# Patient Record
Sex: Female | Born: 1935 | Race: White | Hispanic: No | Smoking: Never smoker
Health system: Southern US, Community
[De-identification: ages and names within clinical notes are randomized; demographics above are authoritative.]

## PROBLEM LIST (undated history)

## (undated) DIAGNOSIS — E039 Hypothyroidism, unspecified: Secondary | ICD-10-CM

## (undated) DIAGNOSIS — S72113A Displaced fracture of greater trochanter of unspecified femur, initial encounter for closed fracture: Secondary | ICD-10-CM

## (undated) DIAGNOSIS — Y92009 Unspecified place in unspecified non-institutional (private) residence as the place of occurrence of the external cause: Secondary | ICD-10-CM

## (undated) DIAGNOSIS — F329 Major depressive disorder, single episode, unspecified: Secondary | ICD-10-CM

## (undated) DIAGNOSIS — D509 Iron deficiency anemia, unspecified: Secondary | ICD-10-CM

## (undated) DIAGNOSIS — K31811 Angiodysplasia of stomach and duodenum with bleeding: Secondary | ICD-10-CM

## (undated) DIAGNOSIS — K219 Gastro-esophageal reflux disease without esophagitis: Secondary | ICD-10-CM

## (undated) DIAGNOSIS — G300 Alzheimer's disease with early onset: Secondary | ICD-10-CM

## (undated) DIAGNOSIS — W19XXXA Unspecified fall, initial encounter: Secondary | ICD-10-CM

## (undated) DIAGNOSIS — F32A Depression, unspecified: Secondary | ICD-10-CM

## (undated) DIAGNOSIS — F0280 Dementia in other diseases classified elsewhere without behavioral disturbance: Secondary | ICD-10-CM

## (undated) DIAGNOSIS — D649 Anemia, unspecified: Secondary | ICD-10-CM

## (undated) HISTORY — DX: Alzheimer's disease with early onset: G30.0

## (undated) HISTORY — PX: THYROID SURGERY: SHX805

## (undated) HISTORY — DX: Angiodysplasia of stomach and duodenum with bleeding: K31.811

## (undated) HISTORY — DX: Iron deficiency anemia, unspecified: D50.9

## (undated) HISTORY — PX: CHOLECYSTECTOMY: SHX55

## (undated) HISTORY — DX: Major depressive disorder, single episode, unspecified: F32.9

## (undated) HISTORY — DX: Unspecified place in unspecified non-institutional (private) residence as the place of occurrence of the external cause: Y92.009

## (undated) HISTORY — DX: Dementia in other diseases classified elsewhere without behavioral disturbance: F02.80

## (undated) HISTORY — DX: Anemia, unspecified: D64.9

## (undated) HISTORY — DX: Unspecified fall, initial encounter: W19.XXXA

## (undated) HISTORY — DX: Depression, unspecified: F32.A

## (undated) HISTORY — PX: BREAST REDUCTION SURGERY: SHX8

## (undated) HISTORY — PX: APPENDECTOMY: SHX54

## (undated) HISTORY — DX: Displaced fracture of greater trochanter of unspecified femur, initial encounter for closed fracture: S72.113A

## (undated) HISTORY — PX: OTHER SURGICAL HISTORY: SHX169

---

## 1999-01-02 ENCOUNTER — Other Ambulatory Visit: Admission: RE | Admit: 1999-01-02 | Discharge: 1999-01-02 | Payer: Self-pay | Admitting: Obstetrics and Gynecology

## 2000-12-31 ENCOUNTER — Other Ambulatory Visit: Admission: RE | Admit: 2000-12-31 | Discharge: 2000-12-31 | Payer: Self-pay | Admitting: Obstetrics and Gynecology

## 2001-01-04 ENCOUNTER — Other Ambulatory Visit: Admission: RE | Admit: 2001-01-04 | Discharge: 2001-01-04 | Payer: Self-pay | Admitting: Obstetrics and Gynecology

## 2001-01-04 ENCOUNTER — Encounter (INDEPENDENT_AMBULATORY_CARE_PROVIDER_SITE_OTHER): Payer: Self-pay | Admitting: Specialist

## 2001-12-29 ENCOUNTER — Ambulatory Visit (HOSPITAL_COMMUNITY): Admission: RE | Admit: 2001-12-29 | Discharge: 2001-12-29 | Payer: Self-pay | Admitting: General Surgery

## 2002-02-10 ENCOUNTER — Ambulatory Visit (HOSPITAL_COMMUNITY): Admission: RE | Admit: 2002-02-10 | Discharge: 2002-02-10 | Payer: Self-pay | Admitting: Internal Medicine

## 2002-12-14 ENCOUNTER — Emergency Department (HOSPITAL_COMMUNITY): Admission: EM | Admit: 2002-12-14 | Discharge: 2002-12-14 | Payer: Self-pay | Admitting: Emergency Medicine

## 2002-12-14 ENCOUNTER — Encounter: Payer: Self-pay | Admitting: Emergency Medicine

## 2003-10-10 ENCOUNTER — Ambulatory Visit (HOSPITAL_COMMUNITY): Admission: RE | Admit: 2003-10-10 | Discharge: 2003-10-10 | Payer: Self-pay | Admitting: Pulmonary Disease

## 2003-10-30 ENCOUNTER — Ambulatory Visit (HOSPITAL_COMMUNITY): Admission: RE | Admit: 2003-10-30 | Discharge: 2003-10-30 | Payer: Self-pay | Admitting: Pulmonary Disease

## 2003-12-10 ENCOUNTER — Ambulatory Visit (HOSPITAL_COMMUNITY): Admission: RE | Admit: 2003-12-10 | Discharge: 2003-12-10 | Payer: Self-pay | Admitting: Orthopedic Surgery

## 2004-06-03 ENCOUNTER — Ambulatory Visit (HOSPITAL_COMMUNITY): Admission: RE | Admit: 2004-06-03 | Discharge: 2004-06-03 | Payer: Self-pay | Admitting: Internal Medicine

## 2004-09-24 ENCOUNTER — Ambulatory Visit (HOSPITAL_COMMUNITY): Admission: RE | Admit: 2004-09-24 | Discharge: 2004-09-24 | Payer: Self-pay | Admitting: Internal Medicine

## 2005-03-08 ENCOUNTER — Emergency Department (HOSPITAL_COMMUNITY): Admission: EM | Admit: 2005-03-08 | Discharge: 2005-03-08 | Payer: Self-pay | Admitting: *Deleted

## 2005-03-21 ENCOUNTER — Ambulatory Visit (HOSPITAL_COMMUNITY): Admission: RE | Admit: 2005-03-21 | Discharge: 2005-03-21 | Payer: Self-pay | Admitting: Pulmonary Disease

## 2005-03-28 ENCOUNTER — Ambulatory Visit (HOSPITAL_COMMUNITY): Admission: RE | Admit: 2005-03-28 | Discharge: 2005-03-28 | Payer: Self-pay | Admitting: Pulmonary Disease

## 2005-04-04 ENCOUNTER — Ambulatory Visit (HOSPITAL_COMMUNITY): Admission: RE | Admit: 2005-04-04 | Discharge: 2005-04-04 | Payer: Self-pay | Admitting: Pulmonary Disease

## 2005-06-16 ENCOUNTER — Ambulatory Visit (HOSPITAL_COMMUNITY): Admission: RE | Admit: 2005-06-16 | Discharge: 2005-06-16 | Payer: Self-pay | Admitting: Pulmonary Disease

## 2005-07-23 ENCOUNTER — Encounter: Admission: RE | Admit: 2005-07-23 | Discharge: 2005-07-23 | Payer: Self-pay | Admitting: Orthopedic Surgery

## 2005-07-29 ENCOUNTER — Ambulatory Visit (HOSPITAL_COMMUNITY): Admission: RE | Admit: 2005-07-29 | Discharge: 2005-07-29 | Payer: Self-pay | Admitting: Orthopedic Surgery

## 2005-07-29 ENCOUNTER — Ambulatory Visit (HOSPITAL_BASED_OUTPATIENT_CLINIC_OR_DEPARTMENT_OTHER): Admission: RE | Admit: 2005-07-29 | Discharge: 2005-07-30 | Payer: Self-pay | Admitting: Orthopedic Surgery

## 2005-07-31 ENCOUNTER — Encounter (HOSPITAL_COMMUNITY): Admission: RE | Admit: 2005-07-31 | Discharge: 2005-08-30 | Payer: Self-pay | Admitting: Orthopedic Surgery

## 2005-09-02 ENCOUNTER — Encounter (HOSPITAL_COMMUNITY): Admission: RE | Admit: 2005-09-02 | Discharge: 2005-10-02 | Payer: Self-pay | Admitting: Orthopedic Surgery

## 2006-01-13 ENCOUNTER — Ambulatory Visit (HOSPITAL_COMMUNITY): Admission: RE | Admit: 2006-01-13 | Discharge: 2006-01-13 | Payer: Self-pay | Admitting: Pulmonary Disease

## 2006-05-21 ENCOUNTER — Ambulatory Visit: Payer: Self-pay | Admitting: Otolaryngology

## 2006-05-21 ENCOUNTER — Other Ambulatory Visit: Payer: Self-pay

## 2006-05-27 ENCOUNTER — Inpatient Hospital Stay: Payer: Self-pay | Admitting: Otolaryngology

## 2006-06-23 ENCOUNTER — Inpatient Hospital Stay: Payer: Self-pay | Admitting: Endocrinology

## 2006-08-06 HISTORY — PX: ESOPHAGOGASTRODUODENOSCOPY: SHX1529

## 2006-08-06 HISTORY — PX: COLONOSCOPY: SHX174

## 2006-08-06 HISTORY — PX: OTHER SURGICAL HISTORY: SHX169

## 2006-08-25 ENCOUNTER — Encounter (HOSPITAL_COMMUNITY): Admission: RE | Admit: 2006-08-25 | Discharge: 2006-09-24 | Payer: Self-pay | Admitting: Pulmonary Disease

## 2006-08-25 ENCOUNTER — Ambulatory Visit (HOSPITAL_COMMUNITY): Payer: Self-pay | Admitting: Pulmonary Disease

## 2006-08-31 ENCOUNTER — Ambulatory Visit: Payer: Self-pay | Admitting: Internal Medicine

## 2006-08-31 ENCOUNTER — Ambulatory Visit (HOSPITAL_COMMUNITY): Admission: RE | Admit: 2006-08-31 | Discharge: 2006-08-31 | Payer: Self-pay | Admitting: Internal Medicine

## 2006-09-04 ENCOUNTER — Ambulatory Visit (HOSPITAL_COMMUNITY): Admission: RE | Admit: 2006-09-04 | Discharge: 2006-09-04 | Payer: Self-pay | Admitting: Internal Medicine

## 2006-09-17 ENCOUNTER — Ambulatory Visit: Payer: Self-pay | Admitting: Internal Medicine

## 2006-11-23 ENCOUNTER — Ambulatory Visit: Payer: Self-pay | Admitting: Internal Medicine

## 2006-11-27 ENCOUNTER — Ambulatory Visit: Payer: Self-pay | Admitting: Internal Medicine

## 2007-03-25 ENCOUNTER — Ambulatory Visit (HOSPITAL_COMMUNITY): Admission: RE | Admit: 2007-03-25 | Discharge: 2007-03-25 | Payer: Self-pay | Admitting: Pulmonary Disease

## 2007-06-03 ENCOUNTER — Encounter (HOSPITAL_COMMUNITY): Admission: RE | Admit: 2007-06-03 | Discharge: 2007-07-03 | Payer: Self-pay | Admitting: Endocrinology

## 2007-08-27 ENCOUNTER — Ambulatory Visit (HOSPITAL_COMMUNITY): Admission: RE | Admit: 2007-08-27 | Discharge: 2007-08-27 | Payer: Self-pay | Admitting: Pulmonary Disease

## 2008-04-17 ENCOUNTER — Ambulatory Visit (HOSPITAL_COMMUNITY): Admission: RE | Admit: 2008-04-17 | Discharge: 2008-04-17 | Payer: Self-pay | Admitting: Pulmonary Disease

## 2008-05-09 ENCOUNTER — Ambulatory Visit: Payer: Self-pay | Admitting: Internal Medicine

## 2008-05-11 ENCOUNTER — Encounter (HOSPITAL_COMMUNITY): Admission: RE | Admit: 2008-05-11 | Discharge: 2008-06-10 | Payer: Self-pay | Admitting: Oncology

## 2008-05-11 ENCOUNTER — Ambulatory Visit (HOSPITAL_COMMUNITY): Payer: Self-pay | Admitting: Internal Medicine

## 2008-05-19 ENCOUNTER — Ambulatory Visit: Payer: Self-pay | Admitting: Internal Medicine

## 2008-07-25 ENCOUNTER — Ambulatory Visit: Payer: Self-pay | Admitting: Internal Medicine

## 2009-02-06 ENCOUNTER — Ambulatory Visit (HOSPITAL_COMMUNITY): Payer: Self-pay | Admitting: Pulmonary Disease

## 2009-02-06 ENCOUNTER — Encounter (HOSPITAL_COMMUNITY): Admission: RE | Admit: 2009-02-06 | Discharge: 2009-03-08 | Payer: Self-pay | Admitting: Pulmonary Disease

## 2009-06-04 ENCOUNTER — Encounter (HOSPITAL_COMMUNITY): Admission: RE | Admit: 2009-06-04 | Discharge: 2009-06-05 | Payer: Self-pay | Admitting: Pulmonary Disease

## 2009-06-04 ENCOUNTER — Ambulatory Visit (HOSPITAL_COMMUNITY): Payer: Self-pay | Admitting: Pulmonary Disease

## 2009-08-20 ENCOUNTER — Encounter (HOSPITAL_COMMUNITY): Admission: RE | Admit: 2009-08-20 | Discharge: 2009-09-19 | Payer: Self-pay | Admitting: Oncology

## 2009-08-21 ENCOUNTER — Ambulatory Visit (HOSPITAL_COMMUNITY): Payer: Self-pay | Admitting: Pulmonary Disease

## 2009-11-21 ENCOUNTER — Inpatient Hospital Stay (HOSPITAL_COMMUNITY): Admission: EM | Admit: 2009-11-21 | Discharge: 2009-11-22 | Payer: Self-pay | Admitting: Emergency Medicine

## 2010-01-29 ENCOUNTER — Ambulatory Visit (HOSPITAL_COMMUNITY): Payer: Self-pay | Admitting: Pulmonary Disease

## 2010-01-29 ENCOUNTER — Encounter (HOSPITAL_COMMUNITY): Admission: RE | Admit: 2010-01-29 | Discharge: 2010-02-28 | Payer: Self-pay | Admitting: Pulmonary Disease

## 2010-01-30 ENCOUNTER — Ambulatory Visit (HOSPITAL_COMMUNITY): Payer: Self-pay | Admitting: Pulmonary Disease

## 2010-04-10 ENCOUNTER — Ambulatory Visit (HOSPITAL_COMMUNITY): Payer: Self-pay | Admitting: Pulmonary Disease

## 2010-04-10 ENCOUNTER — Encounter (HOSPITAL_COMMUNITY): Admission: RE | Admit: 2010-04-10 | Discharge: 2010-05-10 | Payer: Self-pay | Admitting: Pulmonary Disease

## 2010-04-11 ENCOUNTER — Ambulatory Visit (HOSPITAL_COMMUNITY): Payer: Self-pay | Admitting: Pulmonary Disease

## 2010-06-06 ENCOUNTER — Ambulatory Visit (HOSPITAL_COMMUNITY): Payer: Self-pay | Admitting: Pulmonary Disease

## 2010-06-06 ENCOUNTER — Encounter (HOSPITAL_COMMUNITY): Admission: RE | Admit: 2010-06-06 | Discharge: 2010-07-05 | Payer: Self-pay | Admitting: Pulmonary Disease

## 2010-07-09 ENCOUNTER — Encounter (HOSPITAL_COMMUNITY): Admission: RE | Admit: 2010-07-09 | Discharge: 2010-08-08 | Payer: Self-pay | Admitting: Pulmonary Disease

## 2010-08-08 ENCOUNTER — Encounter (HOSPITAL_COMMUNITY)
Admission: RE | Admit: 2010-08-08 | Discharge: 2010-09-07 | Payer: Self-pay | Source: Home / Self Care | Admitting: Oncology

## 2010-08-08 ENCOUNTER — Ambulatory Visit (HOSPITAL_COMMUNITY): Payer: Self-pay | Admitting: Pulmonary Disease

## 2010-09-19 ENCOUNTER — Encounter (HOSPITAL_COMMUNITY)
Admission: RE | Admit: 2010-09-19 | Discharge: 2010-10-19 | Payer: Self-pay | Source: Home / Self Care | Attending: Pulmonary Disease | Admitting: Pulmonary Disease

## 2010-11-08 ENCOUNTER — Other Ambulatory Visit: Payer: Self-pay | Admitting: Pulmonary Disease

## 2010-11-08 ENCOUNTER — Encounter (HOSPITAL_COMMUNITY): Payer: Medicare Other

## 2010-11-08 ENCOUNTER — Ambulatory Visit (HOSPITAL_COMMUNITY): Payer: Medicare Other | Attending: Pulmonary Disease

## 2010-11-08 DIAGNOSIS — D649 Anemia, unspecified: Secondary | ICD-10-CM | POA: Insufficient documentation

## 2010-11-08 LAB — HEMOGLOBIN AND HEMATOCRIT, BLOOD
HCT: 21.5 % — ABNORMAL LOW (ref 36.0–46.0)
HCT: 28.4 % — ABNORMAL LOW (ref 36.0–46.0)
Hemoglobin: 6.8 g/dL — CL (ref 12.0–15.0)
Hemoglobin: 9.6 g/dL — ABNORMAL LOW (ref 12.0–15.0)

## 2010-11-09 LAB — CROSSMATCH
ABO/RH(D): B POS
Antibody Screen: NEGATIVE
Unit division: 0

## 2010-12-03 ENCOUNTER — Observation Stay (HOSPITAL_COMMUNITY)
Admission: AD | Admit: 2010-12-03 | Discharge: 2010-12-04 | Disposition: A | Discharge status: Home / Self Care | Payer: Medicare Other | Source: Ambulatory Visit | Attending: Pulmonary Disease | Admitting: Pulmonary Disease

## 2010-12-03 DIAGNOSIS — E119 Type 2 diabetes mellitus without complications: Secondary | ICD-10-CM | POA: Insufficient documentation

## 2010-12-03 DIAGNOSIS — D649 Anemia, unspecified: Principal | ICD-10-CM | POA: Insufficient documentation

## 2010-12-03 DIAGNOSIS — K5521 Angiodysplasia of colon with hemorrhage: Secondary | ICD-10-CM | POA: Insufficient documentation

## 2010-12-03 DIAGNOSIS — E039 Hypothyroidism, unspecified: Secondary | ICD-10-CM | POA: Insufficient documentation

## 2010-12-03 DIAGNOSIS — Z79899 Other long term (current) drug therapy: Secondary | ICD-10-CM | POA: Insufficient documentation

## 2010-12-03 DIAGNOSIS — I10 Essential (primary) hypertension: Secondary | ICD-10-CM | POA: Insufficient documentation

## 2010-12-03 DIAGNOSIS — R5381 Other malaise: Secondary | ICD-10-CM | POA: Insufficient documentation

## 2010-12-03 DIAGNOSIS — K219 Gastro-esophageal reflux disease without esophagitis: Secondary | ICD-10-CM | POA: Insufficient documentation

## 2010-12-03 LAB — DIFFERENTIAL
Eosinophils Relative: 1 % (ref 0–5)
Lymphocytes Relative: 22 % (ref 12–46)
Lymphs Abs: 2 10*3/uL (ref 0.7–4.0)
Monocytes Absolute: 0.6 10*3/uL (ref 0.1–1.0)
Neutro Abs: 6.4 10*3/uL (ref 1.7–7.7)

## 2010-12-03 LAB — CBC
HCT: 24.7 % — ABNORMAL LOW (ref 36.0–46.0)
Hemoglobin: 7.5 g/dL — ABNORMAL LOW (ref 12.0–15.0)
MCHC: 30.4 g/dL (ref 30.0–36.0)
MCV: 73.7 fL — ABNORMAL LOW (ref 78.0–100.0)
RDW: 18.4 % — ABNORMAL HIGH (ref 11.5–15.5)

## 2010-12-03 LAB — COMPREHENSIVE METABOLIC PANEL
ALT: 17 U/L (ref 0–35)
AST: 33 U/L (ref 0–37)
Alkaline Phosphatase: 52 U/L (ref 39–117)
CO2: 19 mEq/L (ref 19–32)
Chloride: 109 mEq/L (ref 96–112)
Creatinine, Ser: 0.76 mg/dL (ref 0.4–1.2)
GFR calc Af Amer: >60 mL/min (ref >60)
GFR calc non Af Amer: >60 mL/min (ref >60)
Potassium: 3.6 mEq/L (ref 3.5–5.1)
Sodium: 141 mEq/L (ref 135–145)
Total Bilirubin: 0.5 mg/dL (ref 0.3–1.2)

## 2010-12-03 LAB — GLUCOSE, CAPILLARY
Glucose-Capillary: 136 mg/dL — ABNORMAL HIGH (ref 70–99)
Glucose-Capillary: 99 mg/dL (ref 70–99)

## 2010-12-04 LAB — CROSSMATCH

## 2010-12-04 LAB — CBC
HCT: 30.3 % — ABNORMAL LOW (ref 36.0–46.0)
HCT: 31.3 % — ABNORMAL LOW (ref 36.0–46.0)
Hemoglobin: 10 g/dL — ABNORMAL LOW (ref 12.0–15.0)
Hemoglobin: 10.5 g/dL — ABNORMAL LOW (ref 12.0–15.0)
MCH: 26 pg (ref 26.0–34.0)
MCV: 77.5 fL — ABNORMAL LOW (ref 78.0–100.0)
MCV: 77.5 fL — ABNORMAL LOW (ref 78.0–100.0)
Platelets: 96 10*3/uL — ABNORMAL LOW (ref 150–400)
RBC: 4.04 MIL/uL (ref 3.87–5.11)
RDW: 17.6 % — ABNORMAL HIGH (ref 11.5–15.5)
WBC: 5.9 10*3/uL (ref 4.0–10.5)
WBC: 5.7 10*3/uL (ref 4.0–10.5)

## 2010-12-04 LAB — DIFFERENTIAL
Basophils Absolute: 0.1 10*3/uL (ref 0.0–0.1)
Eosinophils Absolute: 0.1 10*3/uL (ref 0.0–0.7)
Eosinophils Relative: 3 % (ref 0–5)
Lymphocytes Relative: 29 % (ref 12–46)
Lymphocytes Relative: 21 % (ref 12–46)
Lymphs Abs: 1.7 10*3/uL (ref 0.7–4.0)
Lymphs Abs: 1.2 10*3/uL (ref 0.7–4.0)
Monocytes Absolute: 0.5 10*3/uL (ref 0.1–1.0)
Monocytes Relative: 7 % (ref 3–12)
Neutro Abs: 3.5 10*3/uL (ref 1.7–7.7)
Neutro Abs: 3.9 10*3/uL (ref 1.7–7.7)
Neutrophils Relative %: 69 % (ref 43–77)

## 2010-12-04 LAB — GLUCOSE, CAPILLARY: Glucose-Capillary: 102 mg/dL — ABNORMAL HIGH (ref 70–99)

## 2010-12-16 LAB — CROSSMATCH
ABO/RH(D): B POS
Antibody Screen: NEGATIVE
Unit division: 0
Unit division: 0

## 2010-12-17 LAB — CROSSMATCH
ABO/RH(D): B POS
Antibody Screen: NEGATIVE
Unit division: 0

## 2010-12-17 LAB — HEMOGLOBIN AND HEMATOCRIT, BLOOD
HCT: 24.1 % — ABNORMAL LOW (ref 36.0–46.0)
Hemoglobin: 7.9 g/dL — ABNORMAL LOW (ref 12.0–15.0)

## 2010-12-18 LAB — CROSSMATCH
ABO/RH(D): B POS
Antibody Screen: NEGATIVE

## 2010-12-18 LAB — HEMOGLOBIN AND HEMATOCRIT, BLOOD: HCT: 21.4 % — ABNORMAL LOW (ref 36.0–46.0)

## 2010-12-19 LAB — DIFFERENTIAL
Basophils Absolute: 0.1 10*3/uL (ref 0.0–0.1)
Lymphocytes Relative: 24 % (ref 12–46)
Lymphs Abs: 2.1 10*3/uL (ref 0.7–4.0)
Monocytes Relative: 7 % (ref 3–12)
Neutrophils Relative %: 66 % (ref 43–77)

## 2010-12-19 LAB — CBC
MCV: 77.3 fL — ABNORMAL LOW (ref 78.0–100.0)
Platelets: 123 10*3/uL — ABNORMAL LOW (ref 150–400)
RDW: 22.7 % — ABNORMAL HIGH (ref 11.5–15.5)
WBC: 8.8 10*3/uL (ref 4.0–10.5)

## 2010-12-19 LAB — CROSSMATCH

## 2010-12-19 LAB — SAMPLE TO BLOOD BANK

## 2010-12-21 NOTE — Progress Notes (Signed)
  NAMESUZZANNE, BRUNKHORST                  ACCOUNT NO.:  [id number]  MEDICAL RECORD NO.:  [id number]           PATIENT TYPE:  I  LOCATION:  A303                          FACILITY:  [facility]  PHYSICIAN:  Avantika Shere L. Juanetta Gosling, M.D.DATE OF BIRTH:  1917/08/25  DATE OF PROCEDURE: DATE OF DISCHARGE:                                PROGRESS NOTE   Ms. Jaso is a 77 year old who was admitted with anemia.  She said she had a little more blood in her stool yesterday evening in the hospital, so I want to make sure that her hemoglobin level has stabilized.  She has no other new complaints.  Her exam shows her temperature is 97.7, pulse 78, respirations 20, blood pressure 129/77, O2 sats 97% on room air.  Her chest is clear.  She looks comfortable.  ASSESSMENT:  She is better.  PLAN:  She is going to have another CBC at noon.  If that is okay, I will plan to discharge her home.     Jenevieve Kirschbaum L. Juanetta Gosling, M.D.     ELH/MEDQ  D:  12/04/2010  T:  12/04/2010  Job:  [id number]  Electronically Signed by Kari Baars M.D. on 12/21/2010 10:37:27 AM

## 2010-12-21 NOTE — H&P (Signed)
  Kara Santos, Kara Santos                  ACCOUNT NO.:  [id number]  MEDICAL RECORD NO.:  [id number]           PATIENT TYPE:  I  LOCATION:  A303                          FACILITY:  [facility]  PHYSICIAN:  Sharifah Champine L. Juanetta Gosling, M.D.DATE OF BIRTH:  12/26/1916  DATE OF ADMISSION:  12/03/2010 DATE OF DISCHARGE:  LH                             HISTORY & PHYSICAL   REASON FOR ADMISSION:  Generalized weakness.  HISTORY:  Kara Santos is a 77 year old who has chronic anemia.  She came to my office for her reevaluation but seemed to get weaker while she was here and then I asked her to get a urinalysis.  She went to the restroom provided a urine specimen but had a lot of bright red blood in the toilet.  She was unaware of having a bowel movement, but she certainly had some bright red blood which could have either been from her urine or from her stool.  She has a significant history of AV malformations of the GI tract and chronic anemia requiring chronic blood transfusions. She says she is very weak and just does not feel well, which is fairly sudden in onset.  PAST MEDICAL HISTORY:  Positive for the problems with anemia.  She has diabetes.  She has had a thyroid cancer and has hypothyroidism after treatment for that.  She has hypertension and GERD.  MEDICATION:  List is not provided yet.  SOCIAL HISTORY:  She does not use any alcohol, tobacco, or illicit drugs.  She lives alone.  FAMILY HISTORY:  Noncontributory at her advanced age.  REVIEW OF SYSTEMS:  Except as mentioned, she denies any fever, chills, nausea, vomiting, cough, or congestion.  PHYSICAL EXAMINATION:  GENERAL:  She looks pale but she does not appear to be any more so than when I usually see her. HEENT:  Her pupils are reactive.  Nose and throat are clear.  Mucous membranes are moist. NECK:  Supple without masses.  She does not have any bruits or JVD. CHEST:  Fairly clear. HEART:  Regular without murmur, gallop, or rub. ABDOMEN:  Soft.   Bowel sounds present and active.  She does not have any masses felt. EXTREMITIES:  No edema. CENTRAL NERVOUS SYSTEM:  Grossly intact.  ASSESSMENT:  She has severe weakness.  She has chronic anemia.  She is going to be brought in for observation.  I am going to give her some IV fluids, check her labs, and then depending on the results of that she may need blood transfusion, etc.     Kara Santos L. Juanetta Gosling, M.D.     ELH/MEDQ  D:  12/03/2010  T:  12/03/2010  Job:  [id number]  Electronically Signed by Kari Baars M.D. on 12/21/2010 10:37:04 AM

## 2010-12-21 NOTE — Discharge Summary (Signed)
  NAMEMARITSA, Kara Santos                  ACCOUNT NO.:  [id number]  MEDICAL RECORD NO.:  [id number]           PATIENT TYPE:  I  LOCATION:  A303                          FACILITY:  [facility]  PHYSICIAN:  Britiney Blahnik L. Juanetta Gosling, M.D.DATE OF BIRTH:  05/27/1917  DATE OF ADMISSION:  12/03/2010 DATE OF DISCHARGE:  LH                              DISCHARGE SUMMARY   FINAL DISCHARGE DIAGNOSES: 1. Anemia. 2. 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 3. Diabetes. 4. Hypothyroidism. 5. Hypertension. 6. Gastroesophageal reflux disease.  HISTORY:  Kara Santos is a 77 year old with chronic anemia.  She had been in my office.  On the day of admission, she seemed to get weaker while she was there.  When I asked her to get a urinalysis, she went to the restroom and had a great deal of bright red blood in the toilet.  She has a long known history of AV malformations of the GI tract and has required multiple blood transfusions.  She said she is very weak, which is fairly sudden in onset, although she does have some chronic weakness.  Her exam showed that she was pale.  Pupils reactive.  Nose and throat were clear.  Her abdomen was soft with positive bowel sounds.  Stool was heme-positive, but only mildly.  Her nose and throat were clear.  The rest of her exam essentially unremarkable.  HOSPITAL COURSE:  She was given 2 units of packed red blood cells.  By the time of discharge, her hemoglobin was 10.  On the morning of discharge, she had started with a hemoglobin of 7.5.  She feels better, feels stronger and wants to be discharged and she is going to be discharged home in improved condition to continue with her regular home medications with the addition of iron over-the-counter 325 mg daily.     Drake Wuertz L. Juanetta Gosling, M.D.     ELH/MEDQ  D:  12/04/2010  T:  12/04/2010  Job:  [id number]  Electronically Signed by Kari Baars M.D. on 12/21/2010 10:36:54 AM

## 2010-12-22 LAB — CROSSMATCH

## 2010-12-22 LAB — HEMOGLOBIN AND HEMATOCRIT, BLOOD
HCT: 24.2 % — ABNORMAL LOW (ref 36.0–46.0)
Hemoglobin: 10.9 g/dL — ABNORMAL LOW (ref 12.0–15.0)

## 2010-12-24 LAB — CROSSMATCH: ABO/RH(D): B POS

## 2010-12-24 LAB — HEMOGLOBIN AND HEMATOCRIT, BLOOD
HCT: 19.4 % — ABNORMAL LOW (ref 36.0–46.0)
Hemoglobin: 6.5 g/dL — CL (ref 12.0–15.0)

## 2010-12-25 LAB — TYPE AND SCREEN: Antibody Screen: NEGATIVE

## 2010-12-25 LAB — DIFFERENTIAL
Basophils Absolute: 0 10*3/uL (ref 0.0–0.1)
Basophils Relative: 0 % (ref 0–1)
Basophils Relative: 1 % (ref 0–1)
Eosinophils Absolute: 0.1 10*3/uL (ref 0.0–0.7)
Eosinophils Relative: 2 % (ref 0–5)
Monocytes Absolute: 0.5 10*3/uL (ref 0.1–1.0)
Monocytes Absolute: 0.6 10*3/uL (ref 0.1–1.0)
Monocytes Relative: 5 % (ref 3–12)
Monocytes Relative: 9 % (ref 3–12)
Neutro Abs: 3.6 10*3/uL (ref 1.7–7.7)
Neutro Abs: 6 10*3/uL (ref 1.7–7.7)

## 2010-12-25 LAB — GLUCOSE, CAPILLARY: Glucose-Capillary: 110 mg/dL — ABNORMAL HIGH (ref 70–99)

## 2010-12-25 LAB — IRON AND TIBC
Iron: 10 ug/dL — ABNORMAL LOW (ref 42–135)
UIBC: 374 ug/dL

## 2010-12-25 LAB — BASIC METABOLIC PANEL
BUN: 12 mg/dL (ref 6–23)
CO2: 19 mEq/L (ref 19–32)
Calcium: 7.9 mg/dL — ABNORMAL LOW (ref 8.4–10.5)
Chloride: 109 mEq/L (ref 96–112)
Chloride: 109 mEq/L (ref 96–112)
Creatinine, Ser: 0.68 mg/dL (ref 0.4–1.2)
Creatinine, Ser: 0.86 mg/dL (ref 0.4–1.2)
GFR calc non Af Amer: >60 mL/min (ref >60)
Glucose, Bld: 104 mg/dL — ABNORMAL HIGH (ref 70–99)
Glucose, Bld: 182 mg/dL — ABNORMAL HIGH (ref 70–99)
Potassium: 3.7 mEq/L (ref 3.5–5.1)

## 2010-12-25 LAB — FOLATE: Folate: >24 ng/mL

## 2010-12-25 LAB — RETICULOCYTES
RBC.: 3.96 MIL/uL (ref 3.87–5.11)
Retic Count, Absolute: 95 10*3/uL (ref 19.0–186.0)
Retic Ct Pct: 2.4 % (ref 0.4–3.1)

## 2010-12-25 LAB — FERRITIN: Ferritin: 3 ng/mL — ABNORMAL LOW (ref 10–291)

## 2010-12-25 LAB — CBC
HCT: 32.5 % — ABNORMAL LOW (ref 36.0–46.0)
Hemoglobin: 10.7 g/dL — ABNORMAL LOW (ref 12.0–15.0)
Hemoglobin: 8.3 g/dL — ABNORMAL LOW (ref 12.0–15.0)
MCHC: 32 g/dL (ref 30.0–36.0)
MCHC: 33 g/dL (ref 30.0–36.0)
MCV: 65.6 fL — ABNORMAL LOW (ref 78.0–100.0)
RBC: 4.43 MIL/uL (ref 3.87–5.11)
RDW: 17.2 % — ABNORMAL HIGH (ref 11.5–15.5)
RDW: 25.9 % — ABNORMAL HIGH (ref 11.5–15.5)

## 2010-12-25 LAB — POCT CARDIAC MARKERS: CKMB, poc: <1 ng/mL — ABNORMAL LOW (ref 1.0–8.0)

## 2011-01-08 LAB — CROSSMATCH

## 2011-01-08 LAB — HEMOGLOBIN AND HEMATOCRIT, BLOOD
HCT: 27 % — ABNORMAL LOW (ref 36.0–46.0)
Hemoglobin: 8.9 g/dL — ABNORMAL LOW (ref 12.0–15.0)

## 2011-01-11 LAB — CROSSMATCH
ABO/RH(D): B POS
Antibody Screen: NEGATIVE

## 2011-01-11 LAB — HEMOGLOBIN AND HEMATOCRIT, BLOOD: Hemoglobin: 12 g/dL (ref 12.0–15.0)

## 2011-01-14 LAB — HEMOGLOBIN AND HEMATOCRIT, BLOOD
HCT: 21 % — ABNORMAL LOW (ref 36.0–46.0)
HCT: 28 % — ABNORMAL LOW (ref 36.0–46.0)
Hemoglobin: 9.2 g/dL — ABNORMAL LOW (ref 12.0–15.0)

## 2011-01-14 LAB — CROSSMATCH
ABO/RH(D): B POS
Antibody Screen: NEGATIVE

## 2011-01-29 ENCOUNTER — Encounter (HOSPITAL_COMMUNITY): Payer: Medicare Other | Attending: Pulmonary Disease

## 2011-01-29 ENCOUNTER — Encounter (HOSPITAL_COMMUNITY): Payer: Medicare Other

## 2011-01-29 ENCOUNTER — Other Ambulatory Visit (HOSPITAL_COMMUNITY): Payer: Self-pay | Admitting: Oncology

## 2011-01-29 DIAGNOSIS — D649 Anemia, unspecified: Secondary | ICD-10-CM | POA: Insufficient documentation

## 2011-01-30 ENCOUNTER — Encounter (HOSPITAL_COMMUNITY): Payer: Medicare Other

## 2011-01-31 LAB — CROSSMATCH
ABO/RH(D): B POS
Antibody Screen: NEGATIVE

## 2011-02-18 NOTE — Assessment & Plan Note (Signed)
Kara, Santos                   CHART#:  [id number]   DATE:  05/09/2008                       DOB:  1917-09-04   CHIEF COMPLAINT:  Followup of anemia.   SUBJECTIVE:  [name] is here for followup visit.  She has a history of  iron deficiency anemia with intermittent hematochezia in the past which  we have seen her for multiple occasions.  Please see below for details.  She saw Dr. Juanetta Gosling recently for recheck.  Blood work revealed a  hemoglobin of 8.8, MCV of 68.8, and platelet count 140,000.  It was felt  that she need to have follow up for microcytic anemia.  In addition, she  also had LFTs which were normal and her TSH was 0.012 which was low.  Notably back in January 2009, she had a hemoglobin of 12.8 and MCV of  8.6 at that time.   She states that she was diagnosed with diabetes since we last saw her.  She has been eating very well balanced diet for years.  She does consume  red meat as well as dark green vegetables.  She states she eats well,  although her daughter tells me that sometimes she just nibbles.  She  denies having any fresh blood in her stool.  Sometimes her stools are  dark, but she is on iron supplement for the past 2 months.  She denies  any abdominal pain, nausea, or vomiting, heartburn, dysphagia,  odynophagia, constipation, or diarrhea.  She lost about 7 pounds in the  last year and a half.  She is not sure whether or not this has  stabilized.  The patient states that she returned 3 hemoccults to Dr.  Juanetta Gosling' office in the last couple of weeks, but she is not having the  results.   CURRENT MEDICATIONS:  1. Aciphex 20 mg daily.  2. Synthroid 100 mcg daily.  3. Deplin/L-methylfolate q.a.m p.r.n.  4. Glumetza 500 mg b.i.d.  5. Darvocet p.r.n.  6. Centrum Silver daily.  7. Poly Iron 150 mg daily, nerve pill, p.r.n.   ALLERGIES:  No known drug allergies.   PAST MEDICAL HISTORY:  History of iron deficiency anemia, received 3  units of packed red blood  cells back in late 2007.  She had an EGD and  colonoscopy in November 2007 which revealed a small hiatal hernia and a  large duodenal diverticulum.  She had on colonoscopy normal rectum, left-  sided diverticulum, and normal appearing colonic mucosa.  She  subsequently had a small bowel capsule test which demonstrated numerous  small bowel AVMs and erosions without active bleeding.  Previously she  had been taking NSAIDs and baby aspirin which were stopped.  We  monitored over the course of the year and hemoglobin had been stable in  the 10 range and continued to improve up until January 2009, when her  hemoglobin was actually normal.  She also has osteoarthrosis, prior  appendectomy, cholecystectomy, breast reduction, bilateral cataract  surgery, benign tumor removed from the back on her right scapula,  history of thyroid cancer status post resection, and diabetes mellitus.   PHYSICAL EXAMINATION:  VITAL SIGNS:  Weight 120 pounds down from 127 in  February 2008, temp 98.1, blood pressure 140/70, and pulse 88.  GENERAL:  Pleasant, elderly Caucasian female in no  acute distress.  SKIN:  Warm and dry.  No jaundice.  HEENT:  Sclerae nonicteric.  Oropharyngeal mucosa moist and pink.  CHEST:  Lungs are clear to auscultation.  CARDIAC:  Regular rate and rhythm.  ABDOMEN:  Positive bowel sounds.  Abdomen is flat, soft, nontender, and  nondistended.  No organomegaly or masses.  LOWER EXTREMITIES:  No edema.   IMPRESSION:  [name] is a pleasant 77 year old lady who presents with  profound microcytic anemia.  Hemoccult status is unknown.  We are trying  to retrieve a copy of her hemoccult results which were done recently.  Her hemoglobin January 2009 was normal.  I suspect that she has had a  chronic occult gastrointestinal bleeding.  She is known to have small  bowel arteriovenous malformations.  At this point in time, we need to  recheck her hemoglobin to make sure that she does not need to be  re-  transfused.  If we found out that she is having occult gastrointestinal  bleeding, the only other offer at this point would be to consider  enteroscopy with possible ablation at [facility].   PLAN:  1. Recheck CBC along with iron, TRBC, and ferritin.  2. Retrieve hemoccult results from Dr. Juanetta Gosling' office done about a      week ago.  3. Further recommendations to follow.       Tana Coast, P.A.  Electronically Signed     R. Roetta Sessions, M.D.  Electronically Signed    LL/MEDQ  D:  05/09/2008  T:  05/10/2008  Job:  [id number]   cc:   Ramon Dredge L. Juanetta Gosling, M.D.

## 2011-02-18 NOTE — Assessment & Plan Note (Signed)
Kara Santos, Kara Santos                   CHART#:  [id number]   DATE:  07/25/2008                       DOB:  [birth year]   CHIEF COMPLAINT:  Followup of anemia.   SUBJECTIVE:  The patient is here for followup.  She has a history of  iron deficiency anemia, chronic GI bleeding with numerous small bowel  AVMs and erosions seen at time of small bowel capsule in 2007 while on  NSAIDs and aspirin.  We saw her on follow up several months ago.  She  had persistent chronic occult GI bleeding and worsening anemia.  She  went over to [facility] and  underwent a double-balloon enteroscopy with APC of 12 and tiny vascular  abnormality seen in the jejunum.  This was on June 19, 2008.  It  was estimated about 6 feet of jejunum was inspected.  The scope could  not be advanced any further at that point.   She states that she is feeling well.  She has no overt GI bleeding.  Her  stools are dark on iron.  She is now on Poly-Iron 150 mg b.i.d. and set  up once daily.  She has a lot of non-GI complaints including dental and  ear pain, which she has seen a dentist and Dr. Juanetta Gosling recently.  She  also was seen at the urgent care for this.  She has felt a little weak  when she got up this morning.  Her daughters told me on multiple  occasions before that the patient often does complain of feeling weak on  a regular basis.  She has had about a 7-pound weight loss since we saw  her in August and her daughter contributes this to a change in diet and  she was diagnosed with diabetes a few months ago.  Her weight has  stabilized apparently over the last week or two according to the patient  and the daughter.  They are watching this closely.  She denies any  nausea or vomiting, dysphagia, or odynophagia.  She has chronic  discomfort in the lower abdomen, which she states she has had for years.  No changes with this.  Her bowel movements are regular.   CURRENT MEDICATIONS:   Aciphex 20 mg daily, Synthroid 100 mcg b.i.d.,  Glumetza 500 mg b.i.d., Darvocet p.r.n., Centrum Silver daily, Poly-Iron  150 mg b.i.d., and clorazepate half tablet daily.   ALLERGIES:  No known drug allergies.   PHYSICAL EXAMINATION:  VITAL SIGNS:  Weight 113, down 7 pounds,  temperature 97.4, blood pressure 110/70, and pulse 72.  GENERAL:  Pleasant elderly Caucasian female, in no acute distress.  SKIN:  Warm and dry.  No jaundice.  HEENT:  Sclerae nonicteric.  Oropharyngeal mucosa moist and pink.  CHEST:  Lungs are clear to auscultation.  CARDIAC:  Reveals regular rate and rhythm.  ABDOMEN:  Positive bowel sounds.  Abdomen is soft, flat, nondistended,  and minimal tenderness around her cholecystectomy scar.  No rebound, no  guarding.  No organomegaly or masses.  No abdominal bruits or hernias.  LOWER EXTREMITIES:  No edema.   IMPRESSION:  The patient is a pleasant 77 year old lady with history of  profound microcytic anemia with heme-positive stools due to chronic  occult gastrointestinal bleeding.  She has underwent argon  plasma  coagulation treatment of 12 tiny vascular lesions seen in her jejunum  recently.  The patient states that she has been feeling stronger.  She  states her weight has stabilized.  She had blood work done within the  last 1-2 weeks both at Dr. Gregery Na and Dr. Juanetta Gosling' office, which we  will try to get those results.   PLAN:  Retrieve recent labs.  She should have her CBC periodically  checked given her history of chronic occult GI bleeding.  Further  recommendations to follow.       Tana Coast, P.A.  Electronically Signed     R. Roetta Sessions, M.D.  Electronically Signed    LL/MEDQ  D:  07/25/2008  T:  07/25/2008  Job:  [id number]   cc:   Ramon Dredge L. Juanetta Gosling, M.D.

## 2011-02-21 NOTE — H&P (Signed)
Kara Santos, Kara Santos                  ACCOUNT NO.:  [id number]   MEDICAL RECORD NO.:  [id number]           PATIENT TYPE:   LOCATION:                                FACILITY:  [facility]   PHYSICIAN:  Edward L. Juanetta Gosling, M.D.DATE OF BIRTH:  1917/06/24   DATE OF ADMISSION:  04/09/2005  DATE OF DISCHARGE:  LH                                HISTORY & PHYSICAL   The patient to have a needle aspiration of a thyroid nodule on April 09, 2005.   [name] is an 77 year old who underwent a CT of the chest because of  pulmonary nodules and at that time was found incidentally to have a right  nodule in the thyroid gland which was 1.8 x 2.1 cm. She then had 6an  ultrasound done which showed that there were two solid thyroid nodules, the  largest was in the right at 2.4 cm. It was felt that she should have a  biopsy done. Otherwise, she has been in fairly good health. She has had the  pulmonary nodules which appear to be stable.   She has a history of hypertension but she is on no medications for that.  Gastroesophageal reflux disease for which she takes Aciphex and  osteoarthritis for which she takes over the counter medications.   FAMILY HISTORY:  Her father died his 72's of heart disease, mother in her  20's of cancer. Sh has had a cholecystectomy but no other surgery. She is a  nonsmoker, she does not drink any alcohol.   REVIEW OF SYMPTOMS:  Except as mentioned is negative. She denies any  swallowing problems. No hot flashes, no thyroid symptoms.   PHYSICAL EXAMINATION:  GENERAL:  Well-developed, well-nourished, thin female  who is in no acute distress. I do not feel the thyroid nodule.  VITAL SIGNS:  Blood pressure 110/70, pulse 68, respirations 14.  CHEST:  Clear.  HEART:  Regular.  ABDOMEN:  Soft.  EXTREMITIES:  No edema.  CNS:  Grossly intact.   ASSESSMENT:  She has a thyroid nodule. She is going to undergo a needle  biopsy.       ELH/MEDQ  D:  04/01/2005  T:  04/01/2005  Job:  [id number]

## 2011-02-21 NOTE — Op Note (Signed)
Kara Santos, Kara Santos                  ACCOUNT NO.:  [id number]   MEDICAL RECORD NO.:  [id number]          PATIENT TYPE:  AMB   LOCATION:  DAY                           FACILITY:  [facility]   PHYSICIAN:  R. Roetta Sessions, M.D. DATE OF BIRTH:  08/08/1917   DATE OF PROCEDURE:  08/31/2006  DATE OF DISCHARGE:                                 OPERATIVE REPORT   PROCEDURE:  Colonoscopy, diagnostic, followed by esophagogastroduodenoscopy.   INDICATIONS FOR PROCEDURE:  Patient is an 77 year old lady, whom Dr. Juanetta Gosling  called me about last week.  She is found to be significantly anemic, in  fact, had to have 3 units of packed red blood cells.  Per family report,  iron studies returned, indicating iron deficiency with a total iron-binding  capacity of 412, iron of 18, saturation 4%, ferritin 2.  She has noted some  intermittent hematochezia, but it has been only intermittent in small  volume.  Her hemoglobin and hematocrit today are 11.6 and 35.8, MCV 67.9,  platelet count 133,000.  Colonoscopy is now being done.  If unrevealing, she  will have an EGD.  This approach has been discussed with the patient and the  patient's family.  Risks, benefits and alternatives have been reviewed and  questions answered.  She is agreeable.  Please see documentation on the  medical record.  She is on AcipHex 20 mg orally daily, which has controlled  her reflux symptoms very well.  She does take aspirin 81 mg daily.   PROCEDURE NOTE:  O2 saturation, blood pressure, pulses, and respirations  were monitored throughout the entire procedure.  Conscious sedation for both  procedures, Versed 7 mg IV, Demerol 50 mg IV in divided doses.  Cetacaine  spray for topical oropharyngeal anesthesia.   INSTRUMENT:  Olympus video chip system.   FINDINGS:  Digital rectal exam revealed no abnormalities.   ENDOSCOPIC FINDINGS:  Prep was adequate.   COLON:  The colonic mucosa was surveyed from the rectosigmoid junction to  the left  transverse, right colon, the appendiceal orifice, the ileocecal  valve, and cecum.  These structures were well seen and photographed for the  record.  From this level, the scope was slowly withdrawn and all previously  mentioned mucosal surfaces were again seen.  The patient had left-sided  diverticula; however, the colonic mucosa appeared normal otherwise.  Examination of the rectum, including retroflexed view of the anal verge,  revealed no abnormalities.  The patient tolerated the procedure well and was  then prepared for upper endoscopy.   FINDINGS:  Examination of the tubular esophagus revealed no mucosal  abnormalities.  EG junction was easy to traverse.   STOMACH:  The gastric cavity was empty and insufflated well with air.  Thorough examination of the gastric mucosa included a retroflexed view of  the proximal stomach and the esophagogastric junction demonstrated only a  small hiatal hernia.  The pylorus was patent and easily transversed.  Examination of the bulb and second portion revealed a large D2 diverticulum,  otherwise the mucosa appeared normal.  The patient tolerated both procedures  well and was reactive to endoscopy.   COLONOSCOPY IMPRESSION:  Normal rectum.  Left-sided diverticulum and colonic  mucosa appeared normal.   EGD FINDINGS:  Normal esophagus, small hiatal hernia, otherwise normal  stomach.  Large duodenal, diverticulum, and D2, otherwise normal D1 and D2.  No explanation for patient's iron-deficiency anemia, based on today's  endoscopy.   RECOMMENDATIONS:  1. Proceed with a Given small bowel capsule study in the very near future.      Further recommendations to follow.  2. I have told Ms. Godinho to avoid taking aspirin until further notice.      Jonathon Bellows, M.D.  Electronically Signed     RMR/MEDQ  D:  08/31/2006  T:  08/31/2006  Job:  [id number]   cc:   Ramon Dredge L. Juanetta Gosling, M.D.  Fax: [phone]

## 2011-02-21 NOTE — H&P (Signed)
[facility]  Patient:    Kara Santos, Kara Santos Visit Number: [id number] MRN: [id number]          Service Type: Attending:  Franky Macho, M.D. Dictated by:   Franky Macho, M.D. Adm. Date:  12/29/01   CC:         Damaris Schooner, M.D.   History and Physical  DATE OF BIRTH:  [birth year]  CHIEF COMPLAINT:  Mass, back.  HISTORY OF PRESENT ILLNESS:  The patient is an 77 year old white female, referred for evaluation and treatment of a mass on her back.  It has been present for some time, but recently has increased in size and causing discomfort.  No drainage has been noted.  PAST MEDICAL HISTORY:  Unremarkable.  PAST SURGICAL HISTORY:  1. Breast reduction surgery.  2. Cholecystectomy.  CURRENT MEDICATIONS:  1. Zantac.  2. Baby aspirin.  ALLERGIES:  No known drug allergies.  REVIEW OF SYSTEMS:  Unremarkable.  PHYSICAL EXAMINATION:  GENERAL:  The patient is a well-developed, well-nourished white female in no acute distress.  VITAL SIGNS:  She is afebrile and vital signs are stable.  LUNGS:  Clear to auscultation with equal breath sounds bilaterally.  HEART:  Regular rate and rhythm without S3, S4, or murmurs.  BACK:  Large, greater than 6 cm, subcutaneous oval mass over the right scapular, rubbery and mobile in nature.  No erythema is noted.  IMPRESSION:  Mass, back.  PLAN:  The patient is scheduled for excision of the mass of the back on December 29, 2001.  The risks and benefits of the procedure including bleeding, infection, and the possibility of recurrence were fully explained to the patient, who gave informed consent. Dictated by:   Franky Macho, M.D. Attending:  Franky Macho, M.D. DD:  12/23/01 TD:  12/25/01 Job: [id number]

## 2011-02-21 NOTE — Op Note (Signed)
Kara Santos, Kara Santos                  ACCOUNT NO.:  [id number]   MEDICAL RECORD NO.:  [id number]          PATIENT TYPE:  AMB   LOCATION:  DSC                          FACILITY:  [facility]   PHYSICIAN:  Robert A. Thurston Hole, M.D. DATE OF BIRTH:  Jan 03, 1917   DATE OF PROCEDURE:  07/29/2005  DATE OF DISCHARGE:                                 OPERATIVE REPORT   PREOPERATIVE DIAGNOSIS:  Right shoulder rotator cuff tear with partial  labrum tear.   POSTOPERATIVE DIAGNOSIS:  1.  Right shoulder irreparable rotator cuff tear.  2.  Right shoulder partial labrum tear.   PROCEDURE:  1.  Right shoulder examination under anesthesia followed by arthroscopic      debridement of irreparable rotator cuff tear.  2.  Right shoulder partial labrum tear debridement.   SURGEON:  Elana Alm. Thurston Hole, M.D.   ASSISTANT:  Julien Girt, P.A.-C.   ANESTHESIA:  General.   OPERATIVE TIME:  45 minutes.   COMPLICATIONS:  None.   INDICATIONS FOR PROCEDURE:  Kara Santos is an 77 year old woman who has had  significant shoulder pain for the past two months secondary to a fall.  Exam  and MRI has documented a complete rotator cuff tear with some contraction  and due to her significant severe pain, she is now to undergo arthroscopy  and either debridement versus repair.   DESCRIPTION OF PROCEDURE:  Kara Santos was brought to the operating room on  July 29, 2005, and placed on the operating table in supine position.  She  received Ancef 1 gram IV preoperatively for prophylaxis.  After being placed  under general anesthesia, her right shoulder was examined.  She had full  range of motion and her shoulder was stable to ligamentous exam.  She was  then placed in a beach chair position and her shoulder and arm were prepped  using sterile DuraPrep and draped using sterile technique.  The shoulder was  sterilely injected with 0.25% Marcaine with epinephrine.  Initially, the  arthroscopy was performed through a posterior  arthroscopic portal, the  arthroscope with a pump attached was placed and through an anterior portal,  an arthroscopic probe was placed.  On initial inspection, the articular  cartilage in the glenohumeral joint showed minimal degenerative changes.  She had partial tearing of the anterior, superior, and posterior labrum, 25-  30% which was debrided.  The biceps tendon anchor and biceps tendon was  intact.  The inferior labrum and anterior inferior glenohumeral ligament  complex was intact.  The rotator cuff showed a complete tear with retraction  of the supraspinatus as well as a partial tear with retraction of the  infraspinatus and partial tear with retraction of the subscapularis.  The  torn edges were carefully and thoroughly debrided but attempts to mobilize  these portions of the rotator cuff were unsuccessful, they were  significantly retracted and scarred in, consistent with a chronic type tear.  After debridement was carried out, I elected not to perform a subacromial  decompression because I did not want to destabilize the humeral head.  Approximately 1/2 of the  humeral head was uncovered after this debridement  had been carried out.  The edges were very smooth and rounded.  A subtotal  bursectomy was carried out.  The shoulder could then be brought through a  full range of motion with no impingement.  At this point, it was felt that  all the pathology had been satisfactorily addressed.  The arthroscopic  instruments were removed.  The portals were closed with 3-0 nylon sutures.  Sterile dressings were applied.  She was found to have an epidermolysis type  reaction to possibly the DuraPrep where there was significant scaling and  rash and skin reaction around the upper portion of her arm which was not a  systemic reaction.  We placed very careful bandages without tape on these  areas.  Of note is that she had been given Decadron 10 mg preoperatively for  nausea and vomiting.  At  this point, the patient was awakened and taken to  the recovery room in stable condition.  Needle and sponge counts were  correct x 2 at the end of the case.   FOLLOW UP CARE:  Kara Santos will be followed as a Recovery [facility] patient  overnight for observation.  She will be discharged tomorrow if stable.  She  will be seen back in the office in a week for sutures out and follow up.      Robert A. Thurston Hole, M.D.  Electronically Signed     RAW/MEDQ  D:  07/29/2005  T:  07/29/2005  Job:  [id number]

## 2011-02-21 NOTE — Op Note (Signed)
[facility]  Patient:    Kara Santos, Kara Santos Visit Number: [id number] MRN: [id number]          Service Type: DSU Location: DAY Attending Physician:  Dalia Heading Dictated by:   Franky Macho, M.D. Proc. Date: 12/29/01 Admit Date:  12/29/2001   CC:         Dr. Esmeralda Arthur   Operative Report  AGE:  76 years old.  PREOPERATIVE DIAGNOSIS:  Large mass, back.  POSTOPERATIVE DIAGNOSIS:  Lipoma, 7 cm.  PROCEDURE:  Excision of 7 cm x 9 mass, back.  SURGEON:  Franky Macho, M.D.  ANESTHESIA:  General.  INDICATIONS:  The patient is an 77 year old white female who presents with an enlarging mass over the right scapular region of the back.  The risks and benefits of the procedure including bleeding, infection, and recurrence of the mass were fully explained to the patient and gave an informed consent.  PROCEDURE:  The patient was placed in the left lateral decubitus position after induction of general anesthesia.  The back was prepped and draped using the usual sterile technique with Betadine.  A longitudinal incision was made down to the mass.  The mass was excised without difficulty.  A lipoma was found.  The subcutaneous layer was reapproximated using a 3-0 Vicryl interrupted suture.  The skin was closed using staples.  Betadine ointment and a dry, sterile dressing were applied. 0.25% Marcaine was instilled into the surrounding wound.  All needle counts were correct at the end of the procedure.  The patient was awakened and transferred to PACU in stable condition.  COMPLICATIONS:  None.  SPECIMENS:  Lipoma, back.  BLOOD LOSS: Minimal. Dictated by:   Franky Macho, M.D. Attending Physician:  Dalia Heading DD:  12/29/01 TD:  12/30/01 Job: [id number]

## 2011-02-21 NOTE — Op Note (Signed)
NAMEMODESTINE, SCHERZINGER                  ACCOUNT NO.:  [id number]   MEDICAL RECORD NO.:  [id number]          PATIENT TYPE:  AMB   LOCATION:  DAY                           FACILITY:  [facility]   PHYSICIAN:  R. Roetta Sessions, M.D. DATE OF BIRTH:  28-Sep-1917   DATE OF PROCEDURE:  09/04/2006  DATE OF DISCHARGE:  09/04/2006                               OPERATIVE REPORT   PROCEDURE:  Givens small bowel capsule endoscopy.   PHYSICIAN:  R. Roetta Sessions, M.D.   INDICATIONS FOR PROCEDURE:  The patient is an 77 year old lady who has a  history of significant anemia.  Studies suggested iron deficiency  anemia.  She has had intermittent hematochezia which is small volume.  Recent EGD revealed a small hiatal hernia and a large duodenal  diverticulum.  Colonoscopy recently revealed left sided diverticulum  but, otherwise, normal.  The patient has been on aspirin 81 mg daily.  Aspirin was discontinued, however, at the time of her colonoscopy on  August 31, 2006.   PROCEDURE FINDINGS:  The patient swallowed the capsule without any  difficulty.  The first gastric image was at 45 seconds.  She had what  appeared to be several small gastric erosions.  Gastric passage time was  1 hour.  Small bowel passage time was 1 hour 12 minutes.  Throughout her  entire small bowel, she had numerous small AVMs.  The first ileocecal  image was at 2 hours 12 minutes 39 seconds.  First cecal image 2 hours  13 minutes 24 seconds.   SUMMARY AND RECOMMENDATIONS:  A few small gastric erosions.  Numerous  small bowel AVMs as well as small bowel erosions, none with evidence of  active bleeding at the time of study.  The study was reviewed by Dr.  Jena Gauss.  These small bowel findings could easily explain the patient's  iron deficiency anemia.  I would recommend that she avoid aspirin and  NSAIDs if possible.  Would continue to check periodic hemoglobin and  hematocrit.      Tana Coast, P.AJonathon Bellows, M.D.  Electronically Signed    LL/MEDQ  D:  09/17/2006  T:  09/17/2006  Job:  [id number]   cc:   Ramon Dredge L. Juanetta Gosling, M.D.  Fax: [phone]

## 2011-03-06 ENCOUNTER — Observation Stay (HOSPITAL_COMMUNITY)
Admission: EM | Admit: 2011-03-06 | Discharge: 2011-03-07 | Disposition: A | Discharge status: Home / Self Care | Payer: Medicare Other | Attending: Pulmonary Disease | Admitting: Pulmonary Disease

## 2011-03-06 DIAGNOSIS — K31811 Angiodysplasia of stomach and duodenum with bleeding: Secondary | ICD-10-CM | POA: Insufficient documentation

## 2011-03-06 DIAGNOSIS — Z79899 Other long term (current) drug therapy: Secondary | ICD-10-CM | POA: Insufficient documentation

## 2011-03-06 DIAGNOSIS — R5381 Other malaise: Secondary | ICD-10-CM | POA: Insufficient documentation

## 2011-03-06 DIAGNOSIS — R5383 Other fatigue: Secondary | ICD-10-CM | POA: Insufficient documentation

## 2011-03-06 DIAGNOSIS — E039 Hypothyroidism, unspecified: Secondary | ICD-10-CM | POA: Insufficient documentation

## 2011-03-06 DIAGNOSIS — K219 Gastro-esophageal reflux disease without esophagitis: Secondary | ICD-10-CM | POA: Insufficient documentation

## 2011-03-06 DIAGNOSIS — I10 Essential (primary) hypertension: Secondary | ICD-10-CM | POA: Insufficient documentation

## 2011-03-06 DIAGNOSIS — E119 Type 2 diabetes mellitus without complications: Secondary | ICD-10-CM | POA: Insufficient documentation

## 2011-03-06 DIAGNOSIS — D649 Anemia, unspecified: Principal | ICD-10-CM | POA: Insufficient documentation

## 2011-03-06 LAB — DIFFERENTIAL
Basophils Relative: 1 % (ref 0–1)
Eosinophils Absolute: 0.1 10*3/uL (ref 0.0–0.7)
Eosinophils Relative: 1 % (ref 0–5)
Lymphocytes Relative: 19 % (ref 12–46)
Monocytes Absolute: 0.6 10*3/uL (ref 0.1–1.0)
Neutrophils Relative %: 70 % (ref 43–77)

## 2011-03-06 LAB — GLUCOSE, CAPILLARY: Glucose-Capillary: 90 mg/dL (ref 70–99)

## 2011-03-06 LAB — COMPREHENSIVE METABOLIC PANEL
AST: 24 U/L (ref 0–37)
Albumin: 3.7 g/dL (ref 3.5–5.2)
Alkaline Phosphatase: 72 U/L (ref 39–117)
CO2: 22 mEq/L (ref 19–32)
Chloride: 105 mEq/L (ref 96–112)
GFR calc Af Amer: >60 mL/min (ref >60)
GFR calc non Af Amer: >60 mL/min (ref >60)
Potassium: 3.6 mEq/L (ref 3.5–5.1)
Total Bilirubin: 0.3 mg/dL (ref 0.3–1.2)

## 2011-03-06 LAB — URINALYSIS, ROUTINE W REFLEX MICROSCOPIC
Glucose, UA: NEGATIVE mg/dL
Hgb urine dipstick: NEGATIVE
Specific Gravity, Urine: 1.01 (ref 1.005–1.030)
Urobilinogen, UA: 0.2 mg/dL (ref 0.0–1.0)
pH: 5.5 (ref 5.0–8.0)

## 2011-03-06 LAB — CBC
Hemoglobin: 7.4 g/dL — ABNORMAL LOW (ref 12.0–15.0)
MCH: 21.6 pg — ABNORMAL LOW (ref 26.0–34.0)
MCV: 73.4 fL — ABNORMAL LOW (ref 78.0–100.0)
Platelets: 143 10*3/uL — ABNORMAL LOW (ref 150–400)
RBC: 3.42 MIL/uL — ABNORMAL LOW (ref 3.87–5.11)
WBC: 7.2 10*3/uL (ref 4.0–10.5)

## 2011-03-06 LAB — SAMPLE TO BLOOD BANK

## 2011-03-07 LAB — CROSSMATCH: Unit division: 0

## 2011-03-07 LAB — GLUCOSE, CAPILLARY: Glucose-Capillary: 97 mg/dL (ref 70–99)

## 2011-03-07 LAB — DIFFERENTIAL
Eosinophils Absolute: 0.2 10*3/uL (ref 0.0–0.7)
Lymphocytes Relative: 33 % (ref 12–46)
Lymphs Abs: 1.7 10*3/uL (ref 0.7–4.0)
Neutro Abs: 2.6 10*3/uL (ref 1.7–7.7)
Neutrophils Relative %: 50 % (ref 43–77)

## 2011-03-07 LAB — CBC
Hemoglobin: 10.2 g/dL — ABNORMAL LOW (ref 12.0–15.0)
MCV: 77.6 fL — ABNORMAL LOW (ref 78.0–100.0)
Platelets: 111 10*3/uL — ABNORMAL LOW (ref 150–400)
RBC: 3.98 MIL/uL (ref 3.87–5.11)
WBC: 5.2 10*3/uL (ref 4.0–10.5)

## 2011-03-11 NOTE — Discharge Summary (Signed)
  Kara Santos, VIEN                  ACCOUNT NO.:  [id number]  MEDICAL RECORD NO.:  [id number]           PATIENT TYPE:  I  LOCATION:  A306                          FACILITY:  [facility]  PHYSICIAN:  Rihan Schueler L. Juanetta Gosling, M.D.DATE OF BIRTH:  04-Dec-1916  DATE OF ADMISSION:  03/06/2011 DATE OF DISCHARGE:  06/01/2012LH                              DISCHARGE SUMMARY   FINAL DISCHARGE DIAGNOSES: 1. Anemia. 2. 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 3. Diabetes. 4. Hypothyroidism. 5. Hypertension. 6. Gastroesophageal reflux disease. 7. History of a low-grade thyroid cancer.  HISTORY:  Kara Santos is a 77 year old who has chronic anemia and who came to my office complaining of severe weakness.  In the past when she has had this severe weakness, it has been because she has had another episode of GI bleeding.  She has chronic AV malformations and there is not really anything that we can do to fix that.  I had intended for her to come to the hospital as a direct admission.  There was some miscommunication and she came to the emergency room, where she was found to have a hemoglobin level of 7.2 and was admitted from there.  Exam showed that she was pale.  She was minimally tender on her abdomen. Stool was actually heme-negative in the emergency room.  HOSPITAL COURSE:  She was typed and crossed for 4 units of blood in case she needed that much, but after 2 units her hemoglobin was 10.2.  She was better.  She is going to be discharged home in improved condition on iron 325 mg daily and she will continue with her other medications at home which are Tylenol Extra Strength one q.6 h. p.r.n. pain or fever, metformin 1000 mg b.i.d., iron over-the-counter 1 tablet twice a day or two daily, AcipHex 20 mg daily, Synthroid 100 mcg daily.  She will follow up in my office in about 2 weeks.  She will be on a diabetic diet.     Lynnsie Linders L. Juanetta Gosling, M.D.     ELH/MEDQ  D:   03/07/2011  T:  03/07/2011  Job:  [id number]  Electronically Signed by Kari Baars M.D. on 03/11/2011 08:39:47 AM

## 2011-03-11 NOTE — Group Therapy Note (Signed)
  NAMEDESHONDA, CRYDERMAN                  ACCOUNT NO.:  [id number]  MEDICAL RECORD NO.:  [id number]           PATIENT TYPE:  I  LOCATION:  A306                          FACILITY:  [facility]  PHYSICIAN:  Rhylynn Perdomo L. Juanetta Gosling, M.D.DATE OF BIRTH:  Apr 28, 1917  DATE OF PROCEDURE: DATE OF DISCHARGE:  03/07/2011                                PROGRESS NOTE   Ms. Gebhardt was admitted with anemia and received 2 units of blood and her hemoglobin is now 10.2.  She has no new complaints.  Her exam shows that her temperature is 97.8, pulse 73, respirations 20, blood pressure 125/72, O2 sats 97% on room air.  Her chest is clear. She looks more comfortable.  She says she feels okay, and I am going to plan to discharge her home today.  Please see discharge summary for details.     Mykel Sponaugle L. Juanetta Gosling, M.D.     ELH/MEDQ  D:  03/07/2011  T:  03/07/2011  Job:  [id number]  Electronically Signed by Kari Baars M.D. on 03/11/2011 08:39:49 AM

## 2011-04-21 ENCOUNTER — Other Ambulatory Visit: Payer: Self-pay | Admitting: Pulmonary Disease

## 2011-04-21 ENCOUNTER — Encounter (HOSPITAL_COMMUNITY): Payer: Medicare Other

## 2011-04-21 ENCOUNTER — Encounter (HOSPITAL_COMMUNITY): Payer: Medicare Other | Attending: Pulmonary Disease

## 2011-04-21 VITALS — BP 134/71 | HR 88 | Temp 97.9°F | Resp 16

## 2011-04-21 DIAGNOSIS — D649 Anemia, unspecified: Secondary | ICD-10-CM

## 2011-04-21 LAB — HEMOGLOBIN AND HEMATOCRIT, BLOOD: HCT: 30.5 % — ABNORMAL LOW (ref 36.0–46.0)

## 2011-04-21 MED ORDER — SODIUM CHLORIDE 0.9 % IJ SOLN
INTRAMUSCULAR | Status: AC
  Administered 2011-04-21: 10 mL
  Filled 2011-04-21: qty 10

## 2011-04-22 ENCOUNTER — Ambulatory Visit (HOSPITAL_COMMUNITY): Payer: Medicare Other

## 2011-04-22 LAB — PREPARE RBC (CROSSMATCH)

## 2011-04-25 LAB — TYPE AND SCREEN
Antibody Screen: NEGATIVE
Unit division: 0

## 2011-06-13 ENCOUNTER — Encounter (HOSPITAL_BASED_OUTPATIENT_CLINIC_OR_DEPARTMENT_OTHER): Payer: Medicare Other

## 2011-06-13 ENCOUNTER — Encounter (HOSPITAL_COMMUNITY): Payer: Medicare Other | Attending: Pulmonary Disease

## 2011-06-13 VITALS — BP 125/70 | HR 89 | Temp 98.1°F | Resp 18

## 2011-06-13 DIAGNOSIS — D649 Anemia, unspecified: Secondary | ICD-10-CM

## 2011-06-13 LAB — PREPARE RBC (CROSSMATCH)

## 2011-06-13 LAB — HEMOGLOBIN AND HEMATOCRIT, BLOOD
HCT: 24.5 % — ABNORMAL LOW (ref 36.0–46.0)
Hemoglobin: 7.3 g/dL — ABNORMAL LOW (ref 12.0–15.0)

## 2011-06-13 MED ORDER — SODIUM CHLORIDE 0.9 % IJ SOLN
10.0000 mL | Freq: Once | INTRAMUSCULAR | Status: AC
  Administered 2011-06-13: 10 mL via INTRAVENOUS
  Filled 2011-06-13: qty 10

## 2011-06-13 MED ORDER — SODIUM CHLORIDE 0.9 % IV SOLN
Freq: Once | INTRAVENOUS | Status: AC
  Administered 2011-06-13: 50 mL via INTRAVENOUS

## 2011-06-13 MED ORDER — SODIUM CHLORIDE 0.9 % IJ SOLN
INTRAMUSCULAR | Status: AC
  Administered 2011-06-13: 10 mL via INTRAVENOUS
  Filled 2011-06-13: qty 10

## 2011-06-13 NOTE — Progress Notes (Signed)
Tolerated transfusion well. 

## 2011-06-14 LAB — TYPE AND SCREEN: Unit division: 0

## 2011-07-04 LAB — HEMOGLOBIN AND HEMATOCRIT, BLOOD
HCT: 28.9 — ABNORMAL LOW
HCT: 37.3
Hemoglobin: 8.8 — ABNORMAL LOW

## 2011-07-04 LAB — CROSSMATCH: ABO/RH(D): B POS

## 2011-07-23 ENCOUNTER — Encounter (HOSPITAL_COMMUNITY): Payer: Self-pay

## 2011-08-08 ENCOUNTER — Encounter (HOSPITAL_COMMUNITY): Payer: Medicare Other | Attending: Pulmonary Disease

## 2011-08-08 ENCOUNTER — Encounter (HOSPITAL_COMMUNITY): Payer: Medicare Other

## 2011-08-08 ENCOUNTER — Telehealth (HOSPITAL_COMMUNITY): Payer: Self-pay

## 2011-08-08 VITALS — BP 135/76 | HR 90 | Temp 98.5°F | Resp 16

## 2011-08-08 DIAGNOSIS — D649 Anemia, unspecified: Secondary | ICD-10-CM | POA: Insufficient documentation

## 2011-08-08 LAB — HEMOGLOBIN AND HEMATOCRIT, BLOOD
HCT: 20.9 % — ABNORMAL LOW (ref 36.0–46.0)
Hemoglobin: 6.2 g/dL — CL (ref 12.0–15.0)

## 2011-08-08 NOTE — Progress Notes (Signed)
Blood transfusion ended at 1550, not 1500.

## 2011-08-08 NOTE — Progress Notes (Signed)
Labs drawn today for hand h,  Type and cross

## 2011-08-08 NOTE — Progress Notes (Signed)
1500  IV D/C. VSS. Tolerated transfusion well.

## 2011-08-09 LAB — TYPE AND SCREEN: Unit division: 0

## 2011-08-11 NOTE — Progress Notes (Signed)
Transfusions completed without incident per R.O'Leary,RN.

## 2011-08-18 ENCOUNTER — Encounter: Payer: Self-pay | Admitting: Pulmonary Disease

## 2011-09-22 ENCOUNTER — Encounter (HOSPITAL_COMMUNITY): Payer: Medicare Other | Attending: Pulmonary Disease

## 2011-09-22 DIAGNOSIS — D649 Anemia, unspecified: Secondary | ICD-10-CM | POA: Insufficient documentation

## 2011-09-22 LAB — PREPARE RBC (CROSSMATCH)

## 2011-09-22 LAB — HEMOGLOBIN AND HEMATOCRIT, BLOOD: HCT: 24.9 % — ABNORMAL LOW (ref 36.0–46.0)

## 2011-09-22 NOTE — Progress Notes (Signed)
Elon Jester presented for labwork. Labs per MD order drawn via Peripheral Line 23 gauge needle inserted in left antecubital.  Good blood return present. Procedure without incident.  Needle removed intact. Patient tolerated procedure well.

## 2011-09-23 ENCOUNTER — Encounter (HOSPITAL_COMMUNITY): Payer: Medicare Other

## 2011-09-23 VITALS — BP 138/66 | HR 82 | Temp 97.6°F | Resp 16

## 2011-09-23 DIAGNOSIS — D649 Anemia, unspecified: Secondary | ICD-10-CM

## 2011-09-23 MED ORDER — SODIUM CHLORIDE 0.9 % IJ SOLN
INTRAMUSCULAR | Status: AC
  Filled 2011-09-23: qty 10

## 2011-09-23 NOTE — Progress Notes (Signed)
Patient tolerated transfusion of 2 units PRBCs well.  No distress noted.  Pt's daughter is with her to transport her home; follow-up reviewed with all.

## 2011-09-24 ENCOUNTER — Encounter (HOSPITAL_COMMUNITY): Payer: Medicare Other

## 2011-09-24 LAB — TYPE AND SCREEN: Unit division: 0

## 2011-10-08 DIAGNOSIS — D649 Anemia, unspecified: Secondary | ICD-10-CM | POA: Diagnosis not present

## 2011-11-05 ENCOUNTER — Encounter (HOSPITAL_COMMUNITY): Payer: Medicare Other | Attending: Pulmonary Disease

## 2011-11-05 DIAGNOSIS — D649 Anemia, unspecified: Secondary | ICD-10-CM | POA: Diagnosis not present

## 2011-11-05 LAB — HEMOGLOBIN AND HEMATOCRIT, BLOOD: Hemoglobin: 6 g/dL — CL (ref 12.0–15.0)

## 2011-11-05 NOTE — Progress Notes (Signed)
Kara Santos presented for labwork. Labs per MD order drawn via Peripheral Line 23 gauge needle inserted in right antecubital.  Good blood return present. Procedure without incident.  Needle removed intact. Patient tolerated procedure well.   

## 2011-11-05 NOTE — Progress Notes (Signed)
CRITICAL VALUE ALERT  Critical value received:  Hgb 6 Date of notification:  11/05/11 Time of notification: 1710  Critical value read back:  yes  Nurse who received alert:  [name], RN MD notified (1st page):  N/A patient is scheduled for transfusion.

## 2011-11-06 ENCOUNTER — Encounter (HOSPITAL_COMMUNITY): Payer: Medicare Other

## 2011-11-06 DIAGNOSIS — D649 Anemia, unspecified: Secondary | ICD-10-CM

## 2011-11-06 LAB — PREPARE RBC (CROSSMATCH)

## 2011-11-06 MED ORDER — SODIUM CHLORIDE 0.9 % IJ SOLN
INTRAMUSCULAR | Status: AC
  Filled 2011-11-06: qty 10

## 2011-11-06 NOTE — Progress Notes (Signed)
Blood transfusion complete, patient tolerated well..

## 2011-11-07 ENCOUNTER — Encounter (HOSPITAL_COMMUNITY): Payer: Medicare Other | Attending: Pulmonary Disease

## 2011-11-07 ENCOUNTER — Encounter (HOSPITAL_COMMUNITY): Payer: Self-pay

## 2011-11-07 ENCOUNTER — Encounter (HOSPITAL_COMMUNITY): Payer: Medicare Other

## 2011-11-07 VITALS — BP 127/69 | HR 82 | Temp 97.5°F | Resp 16

## 2011-11-07 DIAGNOSIS — D649 Anemia, unspecified: Secondary | ICD-10-CM | POA: Diagnosis not present

## 2011-11-07 HISTORY — DX: Anemia, unspecified: D64.9

## 2011-11-07 MED ORDER — SODIUM CHLORIDE 0.9 % IJ SOLN
10.0000 mL | INTRAMUSCULAR | Status: DC | PRN
  Administered 2011-11-07: 10 mL via INTRAVENOUS

## 2011-11-07 MED ORDER — DIPHENHYDRAMINE HCL 25 MG PO CAPS
25.0000 mg | ORAL_CAPSULE | Freq: Four times a day (QID) | ORAL | Status: DC | PRN
  Administered 2011-11-07: 25 mg via ORAL

## 2011-11-07 NOTE — Progress Notes (Signed)
1150 Benedryl 25 mg po for c/o hands itching. 1230 relief from itching

## 2011-11-08 LAB — TYPE AND SCREEN
ABO/RH(D): B POS
Donor AG Type: NEGATIVE
Unit division: 0
Unit division: 0
Unit division: 0

## 2011-11-19 NOTE — Telephone Encounter (Signed)
Tolerated well

## 2011-12-02 DIAGNOSIS — I10 Essential (primary) hypertension: Secondary | ICD-10-CM | POA: Diagnosis not present

## 2011-12-02 DIAGNOSIS — E039 Hypothyroidism, unspecified: Secondary | ICD-10-CM | POA: Diagnosis not present

## 2011-12-02 DIAGNOSIS — R1084 Generalized abdominal pain: Secondary | ICD-10-CM | POA: Diagnosis not present

## 2011-12-03 ENCOUNTER — Encounter (HOSPITAL_COMMUNITY): Payer: Medicare Other

## 2011-12-03 DIAGNOSIS — D649 Anemia, unspecified: Secondary | ICD-10-CM | POA: Diagnosis not present

## 2011-12-03 LAB — PREPARE RBC (CROSSMATCH)

## 2011-12-03 NOTE — Progress Notes (Signed)
Per the blood bank, the patient will not have her prbcs on-site in time to transfuse today.  Dr. Adah Perl office contacted and a message of same was left on Dr. Adah Perl voicemail - also advised that blood bank stated patient was developing antibodies.  Elon Jester initially drove to the hospital for her transfusion; however, I contacted her daughter [name] who came to pick her up to take her home.  Patient will have 3 units transfused over the next 2 days.

## 2011-12-04 ENCOUNTER — Encounter (HOSPITAL_COMMUNITY): Payer: Medicare Other

## 2011-12-04 VITALS — BP 128/67 | HR 89 | Temp 97.9°F | Resp 16

## 2011-12-04 DIAGNOSIS — D649 Anemia, unspecified: Secondary | ICD-10-CM

## 2011-12-04 MED ORDER — SODIUM CHLORIDE 0.9 % IJ SOLN
INTRAMUSCULAR | Status: AC
  Filled 2011-12-04: qty 10

## 2011-12-04 NOTE — Progress Notes (Signed)
Kara Santos tolerated blood transfusions x 2 well and without incident; verbalizes understanding for follow-up.  No distress noted at time of discharge and patient was discharged home with her daughter, [name].  Patient also states she is feeling better today and is aware to return tomorrow for 3rd and final transfusion as ordered per Dr. Juanetta Gosling.

## 2011-12-05 ENCOUNTER — Encounter (HOSPITAL_COMMUNITY): Payer: Medicare Other | Attending: Pulmonary Disease

## 2011-12-05 VITALS — BP 136/72 | HR 77 | Temp 97.8°F | Resp 18

## 2011-12-05 DIAGNOSIS — D649 Anemia, unspecified: Secondary | ICD-10-CM | POA: Insufficient documentation

## 2011-12-05 MED ORDER — SODIUM CHLORIDE 0.9 % IJ SOLN
INTRAMUSCULAR | Status: AC
  Filled 2011-12-05: qty 10

## 2011-12-05 NOTE — Progress Notes (Signed)
Please seen 12/04/11 encounter note.

## 2011-12-05 NOTE — Progress Notes (Signed)
Tolerated transfusion well. 

## 2011-12-06 LAB — TYPE AND SCREEN
Antibody Screen: POSITIVE
DAT, IgG: NEGATIVE
Donor AG Type: NEGATIVE
Donor AG Type: NEGATIVE
Unit division: 0

## 2011-12-17 DIAGNOSIS — D649 Anemia, unspecified: Secondary | ICD-10-CM | POA: Diagnosis not present

## 2012-01-05 DIAGNOSIS — D649 Anemia, unspecified: Secondary | ICD-10-CM | POA: Diagnosis not present

## 2012-01-07 DIAGNOSIS — D649 Anemia, unspecified: Secondary | ICD-10-CM | POA: Diagnosis not present

## 2012-01-12 DIAGNOSIS — D649 Anemia, unspecified: Secondary | ICD-10-CM | POA: Diagnosis not present

## 2012-01-12 DIAGNOSIS — E039 Hypothyroidism, unspecified: Secondary | ICD-10-CM | POA: Diagnosis not present

## 2012-01-13 ENCOUNTER — Encounter (HOSPITAL_COMMUNITY): Payer: Medicare Other | Attending: Pulmonary Disease

## 2012-01-13 DIAGNOSIS — D649 Anemia, unspecified: Secondary | ICD-10-CM | POA: Insufficient documentation

## 2012-01-13 LAB — HEMOGLOBIN AND HEMATOCRIT, BLOOD
HCT: 24.8 % — ABNORMAL LOW (ref 36.0–46.0)
Hemoglobin: 7.3 g/dL — ABNORMAL LOW (ref 12.0–15.0)

## 2012-01-13 NOTE — Progress Notes (Signed)
This encounter was created in error - please disregard.

## 2012-01-14 ENCOUNTER — Encounter (HOSPITAL_COMMUNITY): Payer: Medicare Other

## 2012-01-14 VITALS — BP 137/72 | HR 91 | Temp 97.8°F | Resp 16

## 2012-01-14 DIAGNOSIS — D649 Anemia, unspecified: Secondary | ICD-10-CM

## 2012-01-14 NOTE — Progress Notes (Signed)
Blood transfusion of 2 units PRBC complete, patient tolerated well.  No signs of reaction.  Blood drawn for labs per MD order.  IV removed.    1630:  Patient notified of post transfusion lab results and that she does not need to return for another unit.

## 2012-01-15 LAB — TYPE AND SCREEN
Donor AG Type: NEGATIVE
Unit division: 0

## 2012-02-04 DIAGNOSIS — D649 Anemia, unspecified: Secondary | ICD-10-CM | POA: Diagnosis not present

## 2012-03-03 ENCOUNTER — Inpatient Hospital Stay (HOSPITAL_COMMUNITY)
Admission: AD | Admit: 2012-03-03 | Discharge: 2012-03-04 | DRG: 811 | Disposition: A | Discharge status: Home / Self Care | Payer: Medicare Other | Source: Ambulatory Visit | Attending: Pulmonary Disease | Admitting: Pulmonary Disease

## 2012-03-03 DIAGNOSIS — D649 Anemia, unspecified: Secondary | ICD-10-CM | POA: Diagnosis not present

## 2012-03-03 DIAGNOSIS — D509 Iron deficiency anemia, unspecified: Secondary | ICD-10-CM | POA: Diagnosis present

## 2012-03-03 DIAGNOSIS — E119 Type 2 diabetes mellitus without complications: Secondary | ICD-10-CM | POA: Diagnosis present

## 2012-03-03 DIAGNOSIS — K219 Gastro-esophageal reflux disease without esophagitis: Secondary | ICD-10-CM | POA: Diagnosis present

## 2012-03-03 DIAGNOSIS — D62 Acute posthemorrhagic anemia: Principal | ICD-10-CM | POA: Diagnosis present

## 2012-03-03 DIAGNOSIS — R1031 Right lower quadrant pain: Secondary | ICD-10-CM | POA: Diagnosis present

## 2012-03-03 DIAGNOSIS — K922 Gastrointestinal hemorrhage, unspecified: Secondary | ICD-10-CM | POA: Diagnosis present

## 2012-03-03 DIAGNOSIS — Z85850 Personal history of malignant neoplasm of thyroid: Secondary | ICD-10-CM

## 2012-03-03 DIAGNOSIS — I10 Essential (primary) hypertension: Secondary | ICD-10-CM | POA: Diagnosis present

## 2012-03-03 DIAGNOSIS — E039 Hypothyroidism, unspecified: Secondary | ICD-10-CM | POA: Diagnosis present

## 2012-03-03 DIAGNOSIS — K5521 Angiodysplasia of colon with hemorrhage: Secondary | ICD-10-CM | POA: Diagnosis present

## 2012-03-03 DIAGNOSIS — Z794 Long term (current) use of insulin: Secondary | ICD-10-CM

## 2012-03-03 HISTORY — DX: Hypothyroidism, unspecified: E03.9

## 2012-03-03 HISTORY — DX: Gastro-esophageal reflux disease without esophagitis: K21.9

## 2012-03-03 LAB — GLUCOSE, CAPILLARY

## 2012-03-03 MED ORDER — DOCUSATE SODIUM 100 MG PO CAPS
100.0000 mg | ORAL_CAPSULE | Freq: Two times a day (BID) | ORAL | Status: DC
  Filled 2012-03-03 (×2): qty 1

## 2012-03-03 MED ORDER — ONDANSETRON HCL 4 MG PO TABS: 4.0000 mg | ORAL_TABLET | Freq: Four times a day (QID) | ORAL | Status: DC | PRN

## 2012-03-03 MED ORDER — SODIUM CHLORIDE 0.9 % IJ SOLN
INTRAMUSCULAR | Status: AC
  Administered 2012-03-03: 10 mL
  Filled 2012-03-03: qty 3

## 2012-03-03 MED ORDER — ACETAMINOPHEN 325 MG PO TABS
650.0000 mg | ORAL_TABLET | Freq: Once | ORAL | Status: AC
  Administered 2012-03-04: 650 mg via ORAL
  Filled 2012-03-03: qty 2

## 2012-03-03 MED ORDER — HYDROCODONE-ACETAMINOPHEN 5-325 MG PO TABS: 1.0000 | ORAL_TABLET | ORAL | Status: DC | PRN

## 2012-03-03 MED ORDER — SODIUM CHLORIDE 0.9 % IJ SOLN: 3.0000 mL | INTRAMUSCULAR | Status: DC | PRN

## 2012-03-03 MED ORDER — DIPHENHYDRAMINE HCL 25 MG PO CAPS
25.0000 mg | ORAL_CAPSULE | Freq: Once | ORAL | Status: AC
  Administered 2012-03-04: 25 mg via ORAL
  Filled 2012-03-03: qty 1

## 2012-03-03 MED ORDER — ACETAMINOPHEN 650 MG RE SUPP: 650.0000 mg | Freq: Four times a day (QID) | RECTAL | Status: DC | PRN

## 2012-03-03 MED ORDER — SODIUM CHLORIDE 0.9 % IV SOLN
250.0000 mL | INTRAVENOUS | Status: DC | PRN
  Administered 2012-03-04: 250 mL via INTRAVENOUS

## 2012-03-03 MED ORDER — INSULIN ASPART 100 UNIT/ML ~~LOC~~ SOLN
0.0000 [IU] | Freq: Three times a day (TID) | SUBCUTANEOUS | Status: DC
  Administered 2012-03-04: 1 [IU] via SUBCUTANEOUS

## 2012-03-03 MED ORDER — SODIUM CHLORIDE 0.9 % IJ SOLN: 3.0000 mL | Freq: Two times a day (BID) | INTRAMUSCULAR | Status: DC

## 2012-03-03 MED ORDER — SODIUM CHLORIDE 0.9 % IJ SOLN
3.0000 mL | Freq: Two times a day (BID) | INTRAMUSCULAR | Status: DC
  Administered 2012-03-03: 3 mL via INTRAVENOUS
  Filled 2012-03-03: qty 3

## 2012-03-03 MED ORDER — ACETAMINOPHEN 325 MG PO TABS: 650.0000 mg | ORAL_TABLET | Freq: Four times a day (QID) | ORAL | Status: DC | PRN

## 2012-03-03 MED ORDER — TRAZODONE HCL 50 MG PO TABS: 25.0000 mg | ORAL_TABLET | Freq: Every evening | ORAL | Status: DC | PRN

## 2012-03-03 MED ORDER — ONDANSETRON HCL 4 MG/2ML IJ SOLN: 4.0000 mg | Freq: Four times a day (QID) | INTRAMUSCULAR | Status: DC | PRN

## 2012-03-03 NOTE — Progress Notes (Signed)
Alexis in the lab called in reference to the patient has antibodies in her blood so it has to be sent out to cross match and this process will take longer to get the patient her blood. Patient notified of the delay.

## 2012-03-04 ENCOUNTER — Encounter (HOSPITAL_COMMUNITY): Payer: Self-pay | Admitting: *Deleted

## 2012-03-04 DIAGNOSIS — K219 Gastro-esophageal reflux disease without esophagitis: Secondary | ICD-10-CM | POA: Diagnosis present

## 2012-03-04 DIAGNOSIS — D509 Iron deficiency anemia, unspecified: Secondary | ICD-10-CM | POA: Diagnosis not present

## 2012-03-04 DIAGNOSIS — Z794 Long term (current) use of insulin: Secondary | ICD-10-CM | POA: Diagnosis not present

## 2012-03-04 DIAGNOSIS — K625 Hemorrhage of anus and rectum: Secondary | ICD-10-CM | POA: Diagnosis not present

## 2012-03-04 DIAGNOSIS — D649 Anemia, unspecified: Secondary | ICD-10-CM | POA: Diagnosis not present

## 2012-03-04 DIAGNOSIS — D62 Acute posthemorrhagic anemia: Secondary | ICD-10-CM | POA: Diagnosis present

## 2012-03-04 DIAGNOSIS — K922 Gastrointestinal hemorrhage, unspecified: Secondary | ICD-10-CM | POA: Diagnosis present

## 2012-03-04 DIAGNOSIS — E039 Hypothyroidism, unspecified: Secondary | ICD-10-CM | POA: Diagnosis present

## 2012-03-04 DIAGNOSIS — E119 Type 2 diabetes mellitus without complications: Secondary | ICD-10-CM | POA: Diagnosis present

## 2012-03-04 DIAGNOSIS — Z85850 Personal history of malignant neoplasm of thyroid: Secondary | ICD-10-CM

## 2012-03-04 DIAGNOSIS — I10 Essential (primary) hypertension: Secondary | ICD-10-CM | POA: Diagnosis present

## 2012-03-04 DIAGNOSIS — K5521 Angiodysplasia of colon with hemorrhage: Secondary | ICD-10-CM | POA: Diagnosis present

## 2012-03-04 DIAGNOSIS — R1031 Right lower quadrant pain: Secondary | ICD-10-CM | POA: Diagnosis present

## 2012-03-04 LAB — BASIC METABOLIC PANEL
BUN: 15 mg/dL (ref 6–23)
CO2: 22 mEq/L (ref 19–32)
Chloride: 106 mEq/L (ref 96–112)
Creatinine, Ser: 0.82 mg/dL (ref 0.50–1.10)
Glucose, Bld: 134 mg/dL — ABNORMAL HIGH (ref 70–99)

## 2012-03-04 LAB — GLUCOSE, CAPILLARY: Glucose-Capillary: 101 mg/dL — ABNORMAL HIGH (ref 70–99)

## 2012-03-04 LAB — CBC
HCT: 30.2 % — ABNORMAL LOW (ref 36.0–46.0)
MCV: 74.9 fL — ABNORMAL LOW (ref 78.0–100.0)
RDW: 19.3 % — ABNORMAL HIGH (ref 11.5–15.5)
WBC: 6.7 10*3/uL (ref 4.0–10.5)

## 2012-03-04 LAB — OCCULT BLOOD X 1 CARD TO LAB, STOOL: Fecal Occult Bld: POSITIVE

## 2012-03-04 MED ORDER — SODIUM CHLORIDE 0.9 % IJ SOLN
INTRAMUSCULAR | Status: AC
  Administered 2012-03-04: 14:00:00
  Filled 2012-03-04: qty 3

## 2012-03-04 NOTE — Progress Notes (Signed)
03/04/12 1940 Patient discharged home this evening. Discharge instructions reviewed with patient per C. Raul Del, RN and copy of instructions and f/u appointment information given. IV site d/c'd per nurse tech. Pt left floor in stable condition via w/c accompanied by nurse tech. Kara Santos

## 2012-03-04 NOTE — Discharge Instructions (Signed)

## 2012-03-04 NOTE — Progress Notes (Signed)
UR chart review completed.  

## 2012-03-04 NOTE — Progress Notes (Signed)
Blood now available and transfusion started. Pt. Premedicated with Tylenol and Benadryl as ordered.

## 2012-03-04 NOTE — Care Management Note (Unsigned)
    Page 1 of 1   03/04/2012     11:29:34 AM   CARE MANAGEMENT NOTE 03/04/2012  Patient:  IONA, STAY   Account Number:  [id number]  Date Initiated:  03/04/2012  Documentation initiated by:  Sharrie Rothman  Subjective/Objective Assessment:   Pt admitted from home with anemia. Pt lives alone but her daughter lives next door and frequently checks on pt. Pt will return home at discharge. Pt has a cane but dose not like to use it. Pt has used HH in the past.     Action/Plan:   Pt is requesting HH RN at discharge and a walker. Will discuss with MD.   Anticipated DC Date:  03/10/2012   Anticipated DC Plan:  HOME W HOME HEALTH SERVICES      DC Planning Services  CM consult      Choice offered to / List presented to:             Status of service:  In process, will continue to follow Medicare Important Message given?   (If response is "NO", the following Medicare IM given date fields will be blank) Date Medicare IM given:   Date Additional Medicare IM given:    Discharge Disposition:    Per UR Regulation:    If discussed at Long Length of Stay Meetings, dates discussed:    Comments:  03/04/12 1128 Arlyss Queen, RN BSN CM

## 2012-03-04 NOTE — Progress Notes (Signed)
Subjective: She is receiving blood. She says she feels a little bit better. She has no new complaints.  Objective: Vital signs in last 24 hours: Temp:  [97.3 F (36.3 C)-98.6 F (37 C)] 97.4 F (36.3 C) (05/30 0745) Pulse Rate:  [77-117] 86  (05/30 0745) Resp:  [18-20] 20  (05/30 0745) BP: (108-138)/(62-72) 138/72 mmHg (05/30 0745) SpO2:  [97 %-100 %] 98 % (05/30 0630) Weight:  [45.1 kg (99 lb 6.8 oz)-48 kg (105 lb 13.1 oz)] 48 kg (105 lb 13.1 oz) (05/30 0630) Weight change:  Last BM Date: 03/03/12  Intake/Output from previous day: 05/29 0701 - 05/30 0700 In: 250 [P.O.:240; I.V.:10] Out: 400 [Urine:400]  PHYSICAL EXAM General appearance: alert, cooperative, no distress and Pale Resp: clear to auscultation bilaterally Cardio: regular rate and rhythm, S1, S2 normal, no murmur, click, rub or gallop GI: soft, non-tender; bowel sounds normal; no masses,  no organomegaly Extremities: extremities normal, atraumatic, no cyanosis or edema  Lab Results:    Basic Metabolic Panel: No results found for this basename: NA:2,K:2,CL:2,CO2:2,GLUCOSE:2,BUN:2,CREATININE:2,CALCIUM:2,MG:2,PHOS:2 in the last 72 hours Liver Function Tests: No results found for this basename: AST:2,ALT:2,ALKPHOS:2,BILITOT:2,PROT:2,ALBUMIN:2 in the last 72 hours No results found for this basename: LIPASE:2,AMYLASE:2 in the last 72 hours No results found for this basename: AMMONIA:2 in the last 72 hours CBC:  Basename 03/03/12 1835  WBC --  NEUTROABS --  HGB 5.0*  HCT 17.7*  MCV --  PLT --   Cardiac Enzymes: No results found for this basename: CKTOTAL:3,CKMB:3,CKMBINDEX:3,TROPONINI:3 in the last 72 hours BNP: No results found for this basename: PROBNP:3 in the last 72 hours D-Dimer: No results found for this basename: DDIMER:2 in the last 72 hours CBG:  Basename 03/04/12 0724 03/03/12 1707  GLUCAP 84 87   Hemoglobin A1C: No results found for this basename: HGBA1C in the last 72 hours Fasting Lipid  Panel: No results found for this basename: CHOL,HDL,LDLCALC,TRIG,CHOLHDL,LDLDIRECT in the last 72 hours Thyroid Function Tests: No results found for this basename: TSH,T4TOTAL,FREET4,T3FREE,THYROIDAB in the last 72 hours Anemia Panel: No results found for this basename: VITAMINB12,FOLATE,FERRITIN,TIBC,IRON,RETICCTPCT in the last 72 hours Coagulation: No results found for this basename: LABPROT:2,INR:2 in the last 72 hours Urine Drug Screen: Drugs of Abuse  No results found for this basename: labopia, cocainscrnur, labbenz, amphetmu, thcu, labbarb    Alcohol Level: No results found for this basename: ETH:2 in the last 72 hours Urinalysis: No results found for this basename: COLORURINE:2,APPERANCEUR:2,LABSPEC:2,PHURINE:2,GLUCOSEU:2,HGBUR:2,BILIRUBINUR:2,KETONESUR:2,PROTEINUR:2,UROBILINOGEN:2,NITRITE:2,LEUKOCYTESUR:2 in the last 72 hours Misc. Labs:  ABGS No results found for this basename: PHART,PCO2,PO2ART,TCO2,HCO3 in the last 72 hours CULTURES No results found for this or any previous visit (from the past 240 hour(s)). Studies/Results: No results found.  Medications:  Prior to Admission:  No prescriptions prior to admission   Scheduled:   . acetaminophen  650 mg Oral Once  . diphenhydrAMINE  25 mg Oral Once  . docusate sodium  100 mg Oral BID  . insulin aspart  0-9 Units Subcutaneous TID WC  . sodium chloride  3 mL Intravenous Q12H  . sodium chloride  3 mL Intravenous Q12H  . sodium chloride       Continuous:  VHQ:IONGEX chloride, acetaminophen, acetaminophen, HYDROcodone-acetaminophen, ondansetron (ZOFRAN) IV, ondansetron, sodium chloride, traZODone  Assesment: She was markedly anemic. This is a recurrent problem. She has had GI bleeding in the past and I think this is probably chronic GI bleeding from AV malformations which was a diagnosis made 2 or 3 years ago. She has not been evaluated by GI  for some time and I think it's reasonable while she is in the hospital to ask  them to see her. Active Problems:  * No active hospital problems. *     Plan: Continue with transfusions and get a GI consultation    LOS: 1 day   Lady Wisham L 03/04/2012, 8:41 AM

## 2012-03-04 NOTE — Consult Note (Signed)
Referring Provider: [name], MD Primary Care Physician:  [name], MD, MD Primary Gastroenterologist:  Roetta Sessions, MD Reason for Consultation:  Profound anemia, h/o SB AVMs  HPI: Kara Santos is a 78 y.o. female admitted with profound anemia. 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 In 2009, she underwent double balloon enteroscopy with ablation of several jejunal AVMs. She has had multiple blood transfusions over the years. She is scheduled to receive 3 units of prbcs today. Spoke with patient and daughter. Patient has been receiving prbcs approximately every 6 weeks over the last several months. She also has noted moderate volume fresh blood per rectum intermittently and still having some bleeding 4 or more times monthly but states it is better. Stools are always very dark. Takes iron intermittently. Diet consists of mostly fresh vegetables and little meat. C/O several year h/o RLQ pain. Chronic diarrhea for years, 3-4 BM daily. Poor appetite. No vomiting. Heartburn well-controlled. No dysphagia. No weight loss. No ASA/NSAIDS. No prior CT A/P in system.    Prior to Admission medications   Not on File  Per daughter, patient takes DM pill, synthroid, vitamin B12, MVI, Tylenol prn, pain pill prn. No NSAIDS/ASA.   Current Facility-Administered Medications  Medication Dose Route Frequency Provider Last Rate Last Dose  . 0.9 %  sodium chloride infusion  250 mL Intravenous PRN [name], MD 10 mL/hr at 03/04/12 0334 250 mL at 03/04/12 0334  . acetaminophen (TYLENOL) tablet 650 mg  650 mg Oral Q6H PRN [name], MD       Or  . acetaminophen (TYLENOL) suppository 650 mg  650 mg Rectal Q6H PRN [name], MD      . acetaminophen (TYLENOL) tablet 650 mg  650 mg Oral Once [name], MD   650 mg at 03/04/12 0341  . diphenhydrAMINE (BENADRYL) capsule 25 mg  25 mg Oral Once [name], MD   25 mg at 03/04/12 0340  . docusate sodium (COLACE)  capsule 100 mg  100 mg Oral BID [name], MD      . HYDROcodone-acetaminophen [facility]) 5-325 MG per tablet 1-2 tablet  1-2 tablet Oral Q4H PRN [name], MD      . insulin aspart (novoLOG) injection 0-9 Units  0-9 Units Subcutaneous TID WC [name], MD      . ondansetron [facility]) tablet 4 mg  4 mg Oral Q6H PRN [name], MD       Or  . ondansetron [facility]) injection 4 mg  4 mg Intravenous Q6H PRN [name], MD      . sodium chloride 0.9 % injection 3 mL  3 mL Intravenous Q12H [name], MD      . sodium chloride 0.9 % injection 3 mL  3 mL Intravenous Q12H [name], MD   3 mL at 03/03/12 2112  . sodium chloride 0.9 % injection 3 mL  3 mL Intravenous PRN [name], MD      . sodium chloride 0.9 % injection        10 mL at 03/03/12 1641  . traZODone (DESYREL) tablet 25 mg  25 mg Oral QHS PRN [name], MD        Allergies as of 03/03/2012  . (No Known Allergies)    Past Medical History  Diagnosis Date  . Anemia 11/07/2011    chronic IDA requiring multiple transfusions  . Diabetes  mellitus   . Hypothyroidism     s/p thyroid surgery for thyroid cancer  . HTN (hypertension)   . GERD (gastroesophageal reflux disease)     Past Surgical History  Procedure Date  . Esophagogastroduodenoscopy 08/2006    Dr. Ron[facility] Rumble [facility], large duodenal diverticulum  . Colonoscopy 08/2006    Dr. Lolita Patella sided diverticulosis  . Givens small bowel capsule 08/2006    few gastric erosions, numerous small bowel AVMs with SB erosions  . Double balloon enteroscopy 06/1008    [facility]-->APC of 12 SB AVMs in jejunum. 6 feet of jejunum inspected  . Right shoulder surgery   . Cholecystectomy   . Appendectomy   . Bilateral cataract extractions   . Breast reduction surgery   . Thyroid surgery     thyroid cancer    Family History Noncontributory  History   Social History  . Marital Status: Widowed    Spouse Name: N/A    Number of  Children: N/A  . Years of Education: N/A   Occupational History  . Not on file.   Social History Main Topics  . Smoking status: Never Smoker   . Smokeless tobacco: Not on file  . Alcohol Use: No  . Drug Use: No  . Sexually Active: Not on file   Other Topics Concern  . Not on file   Social History Narrative  . No narrative on file     ROS:  General: Negative for weight loss, fever, chills. C/O diminished appetite. C/O weakness. Eyes: Negative for vision changes.  ENT: Negative for hoarseness, difficulty swallowing , nasal congestion. CV: Negative for chest pain, angina, palpitations, dyspnea on exertion, peripheral edema.  Respiratory: Negative for dyspnea at rest, dyspnea on exertion, cough, sputum, wheezing.  GI: See history of present illness. GU:  Negative for dysuria, hematuria, urinary incontinence, urinary frequency, nocturnal urination.  MS: Negative for joint pain, low back pain.  Derm: Negative for rash or itching.  Neuro: Negative for weakness, abnormal sensation, seizure, frequent headaches, memory loss, confusion.  Psych: Negative for anxiety, depression, suicidal ideation, hallucinations.  Endo: Negative for unusual weight change.  Heme: Negative for bruising or bleeding. Allergy: Negative for rash or hives.       Physical Examination: Vital signs in last 24 hours: Temp:  [97.3 F (36.3 C)-98.6 F (37 C)] 97.4 F (36.3 C) (05/30 0845) Pulse Rate:  [77-117] 86  (05/30 0845) Resp:  [18-20] 18  (05/30 0845) BP: (108-138)/(62-76) 127/76 mmHg (05/30 0845) SpO2:  [97 %-100 %] 98 % (05/30 0845) Weight:  [99 lb 6.8 oz (45.1 kg)-105 lb 13.1 oz (48 kg)] 105 lb 13.1 oz (48 kg) (05/30 0630) Last BM Date: 03/03/12  General: Well-nourished, well-developed in no acute distress. Accompanied by daughter.  Head: Normocephalic, atraumatic.   Eyes: Conjunctiva pale, no icterus. Mouth: Oropharyngeal mucosa moist and pink , no lesions erythema or exudate. Neck: Supple  without thyromegaly, masses, or lymphadenopathy.  Lungs: Clear to auscultation bilaterally.  Heart: Regular rate and rhythm, no murmurs rubs or gallops.  Abdomen: Bowel sounds are normal, nondistended, no hepatosplenomegaly or masses, no abdominal bruits or hernia , no rebound or guarding.  RLQ tenderness to palpation. Rectal: not performed Extremities: No lower extremity edema, clubbing, deformity.  Neuro: Alert and oriented x 4 , grossly normal neurologically.  Skin: Warm and dry, no rash or jaundice.   Psych: Alert and cooperative, normal mood and affect.        Intake/Output from previous day: 05/29 0701 - 05/30  0700 In: 250 [P.O.:240; I.V.:10] Out: 400 [Urine:400] Intake/Output this shift: Total I/O In: 360 [P.O.:360] Out: -   Lab Results: CBC  Basename 03/03/12 1835  WBC --  HGB 5.0*  HCT 17.7*  MCV --  PLT --  Hemoglobin on 01/14/12 was 9.8.   Impression: 78 y/o lady with h/o chronic IDA requiring multiple blood transfusions over the years. We last saw her in 2009. She presents with profound anemia. Chronic hematochezia sometimes with moderate amounts of bleeding. Also with dark stools. Likely ongoing occult GI bleeding from SB AVMs but last EGD/TCS six years ago. Doubt fresh bleeding from AVMs. ?diverticular vs malignancy vs benign anorectal. Discussed wit patient. She wants to avoid procedures. Discussed possibility of CT A/P to further evaluate RLQ pain. Patient is open to discussion and will discuss with Dr. Darrick Penna this afternoon. Looks like last transfusion was 2/29/13. Receiving monthly transfusions since 08/2011.   Plan: 1. Transfuse as needed. 2. Chronic iron supplement. 3. Consider CT A/P for further evaluation of RLQ pain. To discuss with Dr. Darrick Penna.   I would like to thank Dr. Juanetta Gosling for allowing [place] to take part in the care of this nice patient.    LOS: 1 day   Tana Coast  03/04/2012, 9:31 AM

## 2012-03-04 NOTE — Consult Note (Addendum)
PT HAS HAD CHRONIC ANEMIA DUE TO AVMs, DOUBT NEW ETIOLOGY. PT NOT INTERESTED IN PROCEDURES. Pt sees intermittent black stool aNd BRBPR. PT NEEDS IV FE TO MINIMIZE NUMBER OF BLOOD TRANSFUSIONS. SHE IS DEVELOPING RBC ABs.  APPT Tuesday June 4 AT 230 PM WITH HONC. PT AWARE.  REVIEWED.

## 2012-03-05 LAB — TYPE AND SCREEN
ABO/RH(D): B POS
Antibody Screen: POSITIVE
DAT, IgG: NEGATIVE
Donor AG Type: NEGATIVE
Donor AG Type: NEGATIVE
Unit division: 0
Unit division: 0

## 2012-03-05 NOTE — H&P (Signed)
Kara Santos MRN: [id number] DOB/AGE: 1917/08/09 78 y.o. Primary Care Physician:Leobardo Granlund L, MD, MD Admit date: 03/03/2012 Chief Complaint: Weakness HPI: This is a 78 year old who has had a long known history of GI bleeding which is chronic. She has AV malformations. She has required blood transfusions on multiple occasions. She had blood work done on the day of admission that showed that her hemoglobin level was 5. We could not arrange an outpatient transfusion so she was brought in for transfusion in the hospital. She says she feels very weak and somewhat confused.  Past Medical History  Diagnosis Date  . Anemia 11/07/2011    chronic IDA requiring multiple transfusions  . Diabetes mellitus   . Hypothyroidism     s/p thyroid surgery for thyroid cancer  . HTN (hypertension)   . GERD (gastroesophageal reflux disease)    Past Surgical History  Procedure Date  . Esophagogastroduodenoscopy 08/2006    Dr. [facility] Rumble [facility], large duodenal diverticulum  . Colonoscopy 08/2006    Dr. Lolita Patella sided diverticulosis  . Givens small bowel capsule 08/2006    few gastric erosions, numerous small bowel AVMs with SB erosions  . Double balloon enteroscopy 06/1008    [facility]-->APC of 12 SB AVMs in jejunum. 6 feet of jejunum inspected  . Right shoulder surgery   . Cholecystectomy   . Appendectomy   . Bilateral cataract extractions   . Breast reduction surgery   . Thyroid surgery     thyroid cancer        No family history on file.  Social History:  reports that she has never smoked. She does not have any smokeless tobacco history on file. She reports that she does not drink alcohol or use illicit drugs.   Allergies: No Known Allergies  No prescriptions prior to admission       FAO:ZHYQM from the symptoms mentioned above,there are no other symptoms referable to all systems reviewed.  Physical Exam: Blood pressure 155/77, pulse 88, temperature 97.1 F (36.2 C), temperature  source Oral, resp. rate 20, height 4\' 11"  (1.499 m), weight 48 kg (105 lb 13.1 oz), SpO2 99.00%. She is pale. Her pupils are reactive. Her nose and throat are clear. Her neck is supple. Her chest is clear. Her heart is regular. Her abdomen is soft without masses. She has no abdominal tenderness. Her bowel sounds are present and active. Extremities showed no edema. Central nervous system examination is grossly intact    Basename 03/04/12 1511 03/03/12 1835  WBC 6.7 --  NEUTROABS -- --  HGB 9.9* 5.0*  HCT 30.2* 17.7*  MCV 74.9* --  PLT 121* --    Basename 03/04/12 1511  NA 142  K 3.7  CL 106  CO2 22  GLUCOSE 134*  BUN 15  CREATININE 0.82  CALCIUM 7.7*  MG --  lablast2(ast:2,ALT:2,alkphos:2,bilitot:2,prot:2,albumin:2)@    No results found for this or any previous visit (from the past 240 hour(s)).   No results found. Impression:  Principal Problem:  *Acute blood loss anemia Active Problems:  Chronic gastrointestinal bleeding  Diabetes mellitus type 2 in nonobese  Hypothyroidism  History of thyroid cancer     Plan: She will be given 3 units of packed red blood cells and have GI consultation       Cohick L Pager [phone]  03/05/2012, 8:53 AM

## 2012-03-05 NOTE — Discharge Summary (Signed)
Physician Discharge Summary  Patient ID: Kara Santos MRN: [id number] DOB/AGE: 1917/02/10 78 y.o. Primary Care Physician:Demarkis Gheen L, MD, MD Admit date: 03/03/2012 Discharge date: 03/05/2012    Discharge Diagnoses:   Principal Problem:  *Acute blood loss anemia Active Problems:  Chronic gastrointestinal bleeding  Diabetes mellitus type 2 in nonobese  Hypothyroidism  History of thyroid cancer   Medication List  As of 03/05/2012  8:56 AM   TAKE these medications         ACIPHEX 20 MG tablet   Generic drug: RABEprazole   Take 20 tablets by mouth Daily.      clorazepate 7.5 MG tablet   Commonly known as: TRANXENE   Take 7.5 mg by mouth 3 (three) times daily as needed. Nerves      IRON PO   Take 1 tablet by mouth daily.      levothyroxine 100 MCG tablet   Commonly known as: SYNTHROID, LEVOTHROID   Take 100 mcg by mouth daily.      metFORMIN 500 MG (MOD) 24 hr tablet   Commonly known as: GLUMETZA   Take 1,000 mg by mouth 2 (two) times daily.      mulitivitamin with minerals Tabs   Take 1 tablet by mouth daily.      OVER THE COUNTER MEDICATION   Take 1 tablet by mouth daily as needed. A pain medication.            Discharged Condition: Improved    Consults: Gastroenterology  Significant Diagnostic Studies: No results found.  Lab Results: Basic Metabolic Panel:  Basename 03/04/12 1511  NA 142  K 3.7  CL 106  CO2 22  GLUCOSE 134*  BUN 15  CREATININE 0.82  CALCIUM 7.7*  MG --  PHOS --   Liver Function Tests: No results found for this basename: AST:2,ALT:2,ALKPHOS:2,BILITOT:2,PROT:2,ALBUMIN:2 in the last 72 hours   CBC:  Basename 03/04/12 1511 03/03/12 1835  WBC 6.7 --  NEUTROABS -- --  HGB 9.9* 5.0*  HCT 30.2* 17.7*  MCV 74.9* --  PLT 121* --    No results found for this or any previous visit (from the past 240 hour(s)).   Hospital Course: She was admitted with a hemoglobin level of 5. She underwent 3 blood transfusions of packed  red blood cells and improved. She had GI consultation but it was felt that she probably was still having the same problem as before which is bleeding AV malformations. No procedures were felt to be necessary but it was suggested that she start on IV iron. By approximately 36 hours after admission she felt well and was discharged home  Discharge Exam: Blood pressure 155/77, pulse 88, temperature 97.1 F (36.2 C), temperature source Oral, resp. rate 20, height 4\' 11"  (1.499 m), weight 48 kg (105 lb 13.1 oz), SpO2 99.00%. Awake and alert. Pupils are reactive. Her chest is clear.  Disposition: Home to begin IV iron  Discharge Orders    Future Appointments: Provider: Department: Dept Phone: Center:   03/09/2012 2:30 PM Ellouise Newer, [facility] [phone] None     Future Orders Please Complete By Expires   Discharge patient         Follow-up Information    Follow up with Zayli Villafuerte L, MD. Schedule an appointment as soon as possible for a visit in 1 week.   Contact information:   [address] [place] [address] [phone]          Signed:  Mineola Duan L Pager [phone]  03/05/2012, 8:56 AM

## 2012-03-09 ENCOUNTER — Ambulatory Visit (HOSPITAL_COMMUNITY): Payer: Medicare Other | Admitting: Oncology

## 2012-04-05 DIAGNOSIS — D649 Anemia, unspecified: Secondary | ICD-10-CM | POA: Diagnosis not present

## 2012-04-19 DIAGNOSIS — D649 Anemia, unspecified: Secondary | ICD-10-CM | POA: Diagnosis not present

## 2012-04-19 DIAGNOSIS — I10 Essential (primary) hypertension: Secondary | ICD-10-CM | POA: Diagnosis not present

## 2012-04-19 DIAGNOSIS — N390 Urinary tract infection, site not specified: Secondary | ICD-10-CM | POA: Diagnosis not present

## 2012-04-19 DIAGNOSIS — E039 Hypothyroidism, unspecified: Secondary | ICD-10-CM | POA: Diagnosis not present

## 2012-04-22 DIAGNOSIS — Z961 Presence of intraocular lens: Secondary | ICD-10-CM | POA: Diagnosis not present

## 2012-04-22 DIAGNOSIS — H04129 Dry eye syndrome of unspecified lacrimal gland: Secondary | ICD-10-CM | POA: Diagnosis not present

## 2012-04-22 DIAGNOSIS — E11319 Type 2 diabetes mellitus with unspecified diabetic retinopathy without macular edema: Secondary | ICD-10-CM | POA: Diagnosis not present

## 2012-04-22 DIAGNOSIS — H52209 Unspecified astigmatism, unspecified eye: Secondary | ICD-10-CM | POA: Diagnosis not present

## 2012-04-27 ENCOUNTER — Encounter (HOSPITAL_COMMUNITY): Payer: Medicare Other

## 2012-04-27 DIAGNOSIS — D509 Iron deficiency anemia, unspecified: Secondary | ICD-10-CM | POA: Diagnosis not present

## 2012-04-27 DIAGNOSIS — Z85850 Personal history of malignant neoplasm of thyroid: Secondary | ICD-10-CM | POA: Insufficient documentation

## 2012-04-27 DIAGNOSIS — N390 Urinary tract infection, site not specified: Secondary | ICD-10-CM | POA: Diagnosis not present

## 2012-04-27 DIAGNOSIS — D649 Anemia, unspecified: Secondary | ICD-10-CM | POA: Diagnosis not present

## 2012-04-27 DIAGNOSIS — I10 Essential (primary) hypertension: Secondary | ICD-10-CM | POA: Diagnosis not present

## 2012-04-27 LAB — CBC
HCT: 21.6 % — ABNORMAL LOW (ref 36.0–46.0)
MCH: 20.5 pg — ABNORMAL LOW (ref 26.0–34.0)
MCHC: 29.2 g/dL — ABNORMAL LOW (ref 30.0–36.0)
RDW: 18.4 % — ABNORMAL HIGH (ref 11.5–15.5)

## 2012-04-27 LAB — PREPARE RBC (CROSSMATCH)

## 2012-04-27 NOTE — Progress Notes (Signed)
Kara Santos's reason for visit today are for labs as scheduled per MD orders.  Venipuncture performed with a 23 gauge butterfly needle to R Antecubital.  Kara Santos tolerated venipuncture well and without incident; questions were answered and patient was discharged.  Per Dr. Adah Perl order, she will need to be transfused 2 units PRBC for Hgb <7.  Lab Results  Component Value Date   HGB 6.3* 04/27/2012

## 2012-04-28 ENCOUNTER — Encounter (HOSPITAL_COMMUNITY): Payer: Medicare Other

## 2012-04-28 VITALS — BP 137/74 | HR 86 | Temp 98.3°F | Resp 18

## 2012-04-28 DIAGNOSIS — D649 Anemia, unspecified: Secondary | ICD-10-CM

## 2012-04-28 MED ORDER — SODIUM CHLORIDE 0.9 % IJ SOLN
10.0000 mL | INTRAMUSCULAR | Status: AC | PRN
  Administered 2012-04-28: 10 mL

## 2012-04-28 MED ORDER — SODIUM CHLORIDE 0.9 % IV SOLN
250.0000 mL | Freq: Once | INTRAVENOUS | Status: AC
  Administered 2012-04-28: 250 mL via INTRAVENOUS

## 2012-04-28 MED ORDER — SODIUM CHLORIDE 0.9 % IJ SOLN: 10.0000 mL | INTRAMUSCULAR | Status: DC | PRN

## 2012-04-28 NOTE — Progress Notes (Signed)
Tolerated prbc transfusion well.  Home accompanied by daughter. 

## 2012-04-29 LAB — TYPE AND SCREEN
Antibody Screen: NEGATIVE
Unit division: 0

## 2012-05-04 ENCOUNTER — Encounter (HOSPITAL_COMMUNITY): Payer: Medicare Other | Attending: Oncology | Admitting: Oncology

## 2012-05-04 ENCOUNTER — Ambulatory Visit (HOSPITAL_COMMUNITY): Payer: Medicare Other | Admitting: Oncology

## 2012-05-04 ENCOUNTER — Encounter (HOSPITAL_COMMUNITY): Payer: Self-pay | Admitting: Oncology

## 2012-05-04 VITALS — BP 135/85 | HR 99 | Temp 97.7°F | Ht <= 58 in | Wt 92.5 lb

## 2012-05-04 DIAGNOSIS — D649 Anemia, unspecified: Secondary | ICD-10-CM | POA: Diagnosis not present

## 2012-05-04 DIAGNOSIS — Z85850 Personal history of malignant neoplasm of thyroid: Secondary | ICD-10-CM

## 2012-05-04 LAB — COMPREHENSIVE METABOLIC PANEL
CO2: 23 mEq/L (ref 19–32)
Calcium: 8.2 mg/dL — ABNORMAL LOW (ref 8.4–10.5)
Chloride: 104 mEq/L (ref 96–112)
Creatinine, Ser: 0.8 mg/dL (ref 0.50–1.10)
GFR calc Af Amer: 71 mL/min — ABNORMAL LOW (ref >90)
GFR calc non Af Amer: 62 mL/min — ABNORMAL LOW (ref >90)
Glucose, Bld: 97 mg/dL (ref 70–99)
Total Bilirubin: 0.5 mg/dL (ref 0.3–1.2)

## 2012-05-04 LAB — CBC WITH DIFFERENTIAL/PLATELET
Basophils Absolute: 0.1 10*3/uL (ref 0.0–0.1)
Basophils Relative: 1 % (ref 0–1)
Eosinophils Absolute: 0.1 10*3/uL (ref 0.0–0.7)
HCT: 30.7 % — ABNORMAL LOW (ref 36.0–46.0)
Lymphs Abs: 1.7 10*3/uL (ref 0.7–4.0)
MCH: 24 pg — ABNORMAL LOW (ref 26.0–34.0)
MCHC: 31.3 g/dL (ref 30.0–36.0)
MCV: 76.8 fL — ABNORMAL LOW (ref 78.0–100.0)
Monocytes Absolute: 0.5 10*3/uL (ref 0.1–1.0)
Neutro Abs: 3.9 10*3/uL (ref 1.7–7.7)
RDW: 21.3 % — ABNORMAL HIGH (ref 11.5–15.5)

## 2012-05-04 LAB — LACTATE DEHYDROGENASE: LDH: 212 U/L (ref 94–250)

## 2012-05-04 NOTE — Progress Notes (Signed)
Kara Santos presented for labwork. Labs per MD order drawn via Peripheral Line 23 gauge needle inserted in right AC.  Good blood return present. Procedure without incident.  Needle removed intact. Patient tolerated procedure well.   

## 2012-05-04 NOTE — Patient Instructions (Addendum)
Kara Santos  [id number] 1917-02-19 Dr. Glenford Peers   [facility]  Discharge Instructions  RECOMMENDATIONS MADE BY THE CONSULTANT AND ANY TEST RESULTS WILL BE SENT TO YOUR REFERRING DOCTOR.   EXAM FINDINGS BY MD TODAY AND SIGNS AND SYMPTOMS TO REPORT TO CLINIC OR PRIMARY MD: we need to check some lab work and will make a referral to Dr. Karilyn Cota.  As we results we will call your daughter.  MEDICATIONS PRESCRIBED: none   INSTRUCTIONS GIVEN AND DISCUSSED: Other :  Report increased weakness or shortness of breath.  SPECIAL INSTRUCTIONS/FOLLOW-UP: Lab work Needed today  and Return to Clinic in 4 weeks.   I acknowledge that I have been informed and understand all the instructions given to me and received a copy. I do not have any more questions at this time, but understand that I may call the Specialty Clinic at [facility] at [phone] during business hours should I have any further questions or need assistance in obtaining follow-up care.    __________________________________________  _____________  __________ Signature of Patient or Authorized Representative            Date                   Time    __________________________________________ Nurse's Signature

## 2012-05-04 NOTE — Progress Notes (Signed)
Problem #1 severe microcytic anemia with a hemoglobin of 5 g in May of 2013 and a history of guaiac positive stool. She has not had a GI workup in over 5 years but was felt to have AVMs in her stomach that were cauterized at [facility] approximately 6 or 7 years ago. She remains weak tired and still sees blood in her stools that is bright red, sometimes dark, and sometimes just black stools. She usually has just one bowel movement per day. She does not have a sore tongue does not crave ice but has had multiple transfusions over the last several years. She is referred today by Dr. Juanetta Gosling for further evaluation.  Problem #2 history of thyroid cancer treated with radioactive iodine after surgery.  This is a very pleasant 78 year old lady accompanied by her daughters [name] who has had multiple transfusions. She went approximately 8 weeks after her transfusion in May to her most recent transfusion in July. This has been one of the longest intervals between transfusions. She also was found to have mild thrombocytopenia. She has deferred a GI workup when seen by Dr. Darrick Penna in the hospital but would like to see Dr. Karilyn Cota if she needs to be seen again. We will make that referral.  She is a nonsmoker nondrinker. Her second husband died of 11 years ago. His name was Marilu Favre and he was my patient. She has only one daughter who is with her today. She is also had bilateral breast reduction surgery in the distant past. That date she is not clear on.  She has lost only 4-5 pounds in the last 6 months. Her appetite is marginal.  Vital signs are recorded she weighs 92 pounds and is approximately 5 feet tall. She has no adenopathy. Thyroid scar is intact. Abdomen is slightly tender in the right lower quadrant but I cannot feel a mass. She has no hepatosplenomegaly. Bowel sounds are present. She has no leg edema. Dorsalis pedis pulses are 2+. Posterior tibialis pulses are not easy to feel. Breast exam shows scars are  well-healed bilaterally. There are no masses. Lungs are clear. Heart shows a regular rhythm and rate without murmur rub or gallop. She has only a few remaining lower teeth. Throat is clear. Tongue is unremarkable. Eyes reveal cataract surgery bilaterally. She has EOMs which are intact. She is alert and oriented. Facial symmetry is intact. Skin exam on the back shows a scar which she cannot recall to place there.  She needs blood work and a GI consult which we will arrange. She may need a CAT scan but I want her to see Dr. Karilyn Cota first

## 2012-05-05 ENCOUNTER — Telehealth (HOSPITAL_COMMUNITY): Payer: Self-pay

## 2012-05-05 ENCOUNTER — Other Ambulatory Visit (HOSPITAL_COMMUNITY): Payer: Self-pay | Admitting: Oncology

## 2012-05-05 ENCOUNTER — Encounter (HOSPITAL_COMMUNITY): Payer: Self-pay | Admitting: Oncology

## 2012-05-05 DIAGNOSIS — D509 Iron deficiency anemia, unspecified: Secondary | ICD-10-CM

## 2012-05-05 HISTORY — DX: Iron deficiency anemia, unspecified: D50.9

## 2012-05-05 LAB — IRON AND TIBC
Iron: 25 ug/dL — ABNORMAL LOW (ref 42–135)
UIBC: 384 ug/dL (ref 125–400)

## 2012-05-05 LAB — FERRITIN: Ferritin: 7 ng/mL — ABNORMAL LOW (ref 10–291)

## 2012-05-05 LAB — FOLATE: Folate: >20 ng/mL

## 2012-05-05 LAB — HOMOCYSTEINE: Homocysteine: 19.3 umol/L — ABNORMAL HIGH (ref 4.0–15.4)

## 2012-05-05 NOTE — Telephone Encounter (Signed)
Spoke with daughter, Mikael Spray and she will get her mom to start taking a B complex Vitamin.  Per [name] she is already on [employer] which has B complex vitamins in it but will check with pharmacist to see if there is something better.  Is in agreement for iron infusion and appointment scheduled for Friday 05/07/12.

## 2012-05-07 ENCOUNTER — Encounter (HOSPITAL_COMMUNITY): Payer: Medicare Other | Attending: Oncology

## 2012-05-07 VITALS — BP 139/70 | HR 81 | Temp 97.0°F | Resp 18

## 2012-05-07 DIAGNOSIS — D649 Anemia, unspecified: Secondary | ICD-10-CM | POA: Insufficient documentation

## 2012-05-07 DIAGNOSIS — D509 Iron deficiency anemia, unspecified: Secondary | ICD-10-CM | POA: Insufficient documentation

## 2012-05-07 DIAGNOSIS — K922 Gastrointestinal hemorrhage, unspecified: Secondary | ICD-10-CM | POA: Insufficient documentation

## 2012-05-07 DIAGNOSIS — R634 Abnormal weight loss: Secondary | ICD-10-CM | POA: Insufficient documentation

## 2012-05-07 MED ORDER — SODIUM CHLORIDE 0.9 % IV SOLN: 1020.0000 mg | Freq: Once | INTRAVENOUS | Status: DC

## 2012-05-07 MED ORDER — SODIUM CHLORIDE 0.9 % IV SOLN
Freq: Once | INTRAVENOUS | Status: AC
  Administered 2012-05-07: 10:00:00 via INTRAVENOUS

## 2012-05-07 MED ORDER — SODIUM CHLORIDE 0.9 % IJ SOLN
INTRAMUSCULAR | Status: AC
  Filled 2012-05-07: qty 10

## 2012-05-07 MED ORDER — SODIUM CHLORIDE 0.9 % IJ SOLN
10.0000 mL | INTRAMUSCULAR | Status: DC | PRN
  Filled 2012-05-07: qty 10

## 2012-05-07 MED ORDER — SODIUM CHLORIDE 0.9 % IV SOLN
1020.0000 mg | Freq: Once | INTRAVENOUS | Status: AC
  Administered 2012-05-07: 1020 mg via INTRAVENOUS
  Filled 2012-05-07: qty 34

## 2012-05-13 ENCOUNTER — Encounter (INDEPENDENT_AMBULATORY_CARE_PROVIDER_SITE_OTHER): Payer: Self-pay | Admitting: Internal Medicine

## 2012-05-13 ENCOUNTER — Ambulatory Visit (INDEPENDENT_AMBULATORY_CARE_PROVIDER_SITE_OTHER): Payer: Medicare Other | Admitting: Internal Medicine

## 2012-05-13 VITALS — BP 164/70 | HR 72 | Temp 97.6°F | Ht 59.0 in | Wt 93.0 lb

## 2012-05-13 DIAGNOSIS — D649 Anemia, unspecified: Secondary | ICD-10-CM

## 2012-05-13 DIAGNOSIS — K922 Gastrointestinal hemorrhage, unspecified: Secondary | ICD-10-CM | POA: Diagnosis not present

## 2012-05-13 DIAGNOSIS — K5520 Angiodysplasia of colon without hemorrhage: Secondary | ICD-10-CM

## 2012-05-13 DIAGNOSIS — Q2733 Arteriovenous malformation of digestive system vessel: Secondary | ICD-10-CM

## 2012-05-13 NOTE — Progress Notes (Signed)
Subjective:     Patient ID: Kara Santos, female   DOB: 1917-05-09, 78 y.o.   MRN: [id number]  HPI Referred to our office by Dr. Mariel Sleet for anemia.He has seen her once. Per his notes she is transfused every 2-21/2 months.  Hx of AVM. She tells me her stools are black. (Patient is also on iron). She also passes bright red blood this am. Her symptoms have been going on for years.   . She has a long history of iron deficiency, chronic GI bleeding with numerous small bowel AVMs and erosions seen at time of small bowel capsule in 2007 while on NSAIDs and aspirin.  She has been seen at [facility] at [facility] and underwent a double-balloon enteroscopy with APC of 12 tiny vascular abnormality seen in the jejunum September 15,2009 per Dr. Luvenia Starch notes. It was estimated about 6 feet of jejunum was inspected. (Scope could not be advanced any further at that point) anemia   Colonoscopy/EGD 2007 Dr. Jena Gauss:  Colonoscopy: Normal rectum. Left-sided diverticulum and colonic  mucosa appeared normal.  EGD FINDINGS: Normal esophagus, small hiatal hernia, otherwise normal  stomach. Large duodenal, diverticulum, and D2, otherwise normal D1 and D2.  No explanation for patient's iron-deficiency anemia, based on today's   2007 Givens Capsule:.  SUMMARY AND RECOMMENDATIONS: A few small gastric erosions. Numerous  small bowel AVMs as well as small bowel erosions, none with evidence of  active bleeding at the time of study. The study was reviewed by Dr.  Jena Gauss. These small bowel findings could easily explain the patient's  iron deficiency anemia. I would recommend that she avoid aspirin and  NSAIDs if possible. Would continue to check periodic hemoglobin and  CBC    Component Value Date/Time   WBC 6.3 05/04/2012 1740   RBC 4.00 05/04/2012 1740   HGB 9.6* 05/04/2012 1740   HCT 30.7* 05/04/2012 1740   PLT 119* 05/04/2012 1740   MCV 76.8* 05/04/2012 1740   MCH 24.0* 05/04/2012 1740   MCHC  31.3 05/04/2012 1740   RDW 21.3* 05/04/2012 1740   LYMPHSABS 1.7 05/04/2012 1740   MONOABS 0.5 05/04/2012 1740   EOSABS 0.1 05/04/2012 1740   BASOSABS 0.1 05/04/2012 1740     Review of Systems see hpi Current Outpatient Prescriptions  Medication Sig Dispense Refill  . ACIPHEX 20 MG tablet Take 20 mg by mouth Daily.       . clorazepate (TRANXENE) 7.5 MG tablet Take 7.5 mg by mouth 3 (three) times daily as needed. Nerves      . cyanocobalamin 1000 MCG tablet Take 100 mcg by mouth daily.      . IRON PO Take 1 tablet by mouth daily. Out of med x week or so.      . levothyroxine (SYNTHROID, LEVOTHROID) 100 MCG tablet Take 100 mcg by mouth daily.      . metFORMIN (GLUMETZA) 500 MG (MOD) 24 hr tablet Take 1,000 mg by mouth 2 (two) times daily.      . Multiple Vitamin (MULITIVITAMIN WITH MINERALS) TABS Take 1 tablet by mouth daily.      [name] Kitchen OVER THE COUNTER MEDICATION Take 1 tablet by mouth daily as needed. A pain medication.      [name] Kitchen PROPOXYPHENE HCL PO Take by mouth 4 (four) times daily as needed.        Past Medical History  Diagnosis Date  . Anemia 11/07/2011    chronic IDA requiring multiple transfusions  . Diabetes mellitus   .  Hypothyroidism     s/p thyroid surgery for thyroid cancer  . GERD (gastroesophageal reflux disease)   . Iron deficiency anemia 05/05/2012   Past Surgical History  Procedure Date  . Esophagogastroduodenoscopy 08/2006    Dr. Ronni Rumble [facility], large duodenal diverticulum  . Colonoscopy 08/2006    Dr. Lolita Patella sided diverticulosis  . Givens small bowel capsule 08/2006    few gastric erosions, numerous small bowel AVMs with SB erosions  . Double balloon enteroscopy 06/1008    [facility]-->APC of 12 SB AVMs in jejunum. 6 feet of jejunum inspected  . Right shoulder surgery   . Cholecystectomy   . Appendectomy   . Bilateral cataract extractions   . Breast reduction surgery   . Thyroid surgery     thyroid cancer   Family Status  Relation Status Death Age  . Mother  Deceased   . Father Deceased   . Sister Alive   . Brother Deceased   . Brother Deceased    History   Social History  . Marital Status: Widowed    Spouse Name: N/A    Number of Children: N/A  . Years of Education: N/A   Occupational History  . Not on file.   Social History Main Topics  . Smoking status: Never Smoker   . Smokeless tobacco: Never Used  . Alcohol Use: No  . Drug Use: No  . Sexually Active: Not on file   Other Topics Concern  . Not on file   Social History Narrative  . No narrative on file   No Known Allergies      Objective:   Physical Exam Filed Vitals:   05/13/12 1548  Height: 4\' 11"  (1.499 m)  Weight: 93 lb (42.185 kg)   Alert and oriented. Skin warm and dry. Oral mucosa is moist.   . Sclera anicteric, conjunctivae is pink. Thyroid not enlarged. No cervical lymphadenopathy. Lungs clear. Heart regular rate and rhythm.  Abdomen is soft. Bowel sounds are positive. No hepatomegaly. No abdominal masses felt. No tenderness.  No edema to lower extremities. Patient is alert and oriented.      Assessment:   GI bleed, iron deficiency anemia, probably from AVMs in the small bowel.  She has had multiple transfusions in the past. Her last colonoscopy and EGD were in 2007. I will discuss this with Dr. Karilyn Cota when he returns.  Presently starting Iron transfusions.     Plan:    I am going to discuss this case with Dr. Karilyn Cota.

## 2012-05-13 NOTE — Patient Instructions (Addendum)
I will discuss this case with Dr Karilyn Cota. Possible APC therapy at [facility]. No ASA OV in one month on Monday or Tuesday while Dr. Karilyn Cota is in office.

## 2012-05-17 ENCOUNTER — Telehealth (INDEPENDENT_AMBULATORY_CARE_PROVIDER_SITE_OTHER): Payer: Self-pay | Admitting: Internal Medicine

## 2012-05-17 NOTE — Telephone Encounter (Signed)
I have talked to her daughter. They have not decided whether they are going to have the procedures. Colonoscopy and EGD. She will let me know.  They are going to let me know the 9th of September if she is going to have the procedures.

## 2012-06-01 ENCOUNTER — Encounter (HOSPITAL_COMMUNITY): Payer: Self-pay | Admitting: Oncology

## 2012-06-01 ENCOUNTER — Encounter (HOSPITAL_BASED_OUTPATIENT_CLINIC_OR_DEPARTMENT_OTHER): Payer: Medicare Other | Admitting: Oncology

## 2012-06-01 VITALS — BP 133/79 | HR 85 | Temp 96.0°F | Resp 16 | Wt 94.7 lb

## 2012-06-01 DIAGNOSIS — R634 Abnormal weight loss: Secondary | ICD-10-CM

## 2012-06-01 DIAGNOSIS — E119 Type 2 diabetes mellitus without complications: Secondary | ICD-10-CM

## 2012-06-01 DIAGNOSIS — D509 Iron deficiency anemia, unspecified: Secondary | ICD-10-CM

## 2012-06-01 DIAGNOSIS — K922 Gastrointestinal hemorrhage, unspecified: Secondary | ICD-10-CM | POA: Diagnosis not present

## 2012-06-01 DIAGNOSIS — D649 Anemia, unspecified: Secondary | ICD-10-CM | POA: Diagnosis not present

## 2012-06-01 LAB — CBC
HCT: 30.2 % — ABNORMAL LOW (ref 36.0–46.0)
Hemoglobin: 10 g/dL — ABNORMAL LOW (ref 12.0–15.0)
MCV: 84.1 fL (ref 78.0–100.0)
RDW: 22.5 % — ABNORMAL HIGH (ref 11.5–15.5)
WBC: 5.6 10*3/uL (ref 4.0–10.5)

## 2012-06-01 MED ORDER — MEGESTROL ACETATE 40 MG/ML PO SUSP: 200.0000 mg | Freq: Two times a day (BID) | ORAL | Status: AC

## 2012-06-01 NOTE — Patient Instructions (Addendum)
TIFFINE HENIGAN  DOB 03-11-1917 CSN [id number]  MRN [id number] Dr. Glenford Peers   [facility]  Discharge Instructions  RECOMMENDATIONS MADE BY THE CONSULTANT AND ANY TEST RESULTS WILL BE SENT TO YOUR REFERRING DOCTOR.   EXAM FINDINGS BY MD TODAY AND SIGNS AND SYMPTOMS TO REPORT TO CLINIC OR PRIMARY MD: you look better.  We will check some blood work today to see how your hemoglobin is doing.  Stop the oral iron.  Check your blood sugar only 1 time daily.  Limit your desert intake to 1 serving daily.  Call us in a couple of weeks and let us know how you are doing.  MEDICATIONS PRESCRIBED: Megace suspension take 5 ml twice daily to help increase your appetite. Follow label directions  INSTRUCTIONS GIVEN AND DISCUSSED: Other :  Report increased weakness, increased shortness of breath, etc.  SPECIAL INSTRUCTIONS/FOLLOW-UP: Lab work Needed today and  every 8 weeks and Return to Clinic in 2 weeks for follow-up.   I acknowledge that I have been informed and understand all the instructions given to me and received a copy. I do not have any more questions at this time, but understand that I may call the Specialty Clinic at [facility] at [phone] during business hours should I have any further questions or need assistance in obtaining follow-up care.    __________________________________________  _____________  __________ Signature of Patient or Authorized Representative            Date                   Time    __________________________________________ Nurse's Signature

## 2012-06-01 NOTE — Progress Notes (Signed)
#  1 severe microcytic anemia with a hemoglobin of 5 g in May 2013 and quite positive stools with a GI workup years ago revealing AVMs in her stomach cauterized at [facility] 6 or 7 years ago. She is now seen Dr. Karilyn Cota. He has recommended EGD and colonoscopy but she has declined for right now. I have are assured her that it would be important to do this. Her iron stores showed essentially no iron. We have given her IV feraheme which she tolerated well and she is feeling better and her daughter also states she looks better and feels better as well. She clearly has better color to [place]. Her blood work is pending.  She has not seen any blood in her stools she states. She's been on oral iron for over 2 years with no improvement so we will stop the iron by now. I don't think we can provide upon it whatsoever.  We will start checking CBCs and iron studies every 8 weeks for now. Her daughter is very concerned that she may not make it more than 4 weeks before needing a transfusion but I have reassured her that if we can keep up adequate iron stores and if her bone marrow still adequate to make red cells that we can hopefully avoid blood transfusions in the future. That would be the ideal goal.  The patient also wanted to discuss her diabetes. For right now with her sugars ranging from low 90s to as high as 144 that I personally think she can stop checking her sugars twice a day and just check him once a day. We then went over her dietary intake of free sugars which she does have somewhat excessive intake of and she will cut her desserts usage in half she promises. I will see her in 2 months

## 2012-06-02 ENCOUNTER — Other Ambulatory Visit (HOSPITAL_COMMUNITY): Payer: Self-pay | Admitting: *Deleted

## 2012-06-02 DIAGNOSIS — D509 Iron deficiency anemia, unspecified: Secondary | ICD-10-CM

## 2012-06-02 LAB — IRON AND TIBC
TIBC: 241 ug/dL — ABNORMAL LOW (ref 250–470)
UIBC: 165 ug/dL (ref 125–400)

## 2012-06-02 NOTE — Progress Notes (Signed)
Dr. Thornton Papas note appreciated.

## 2012-06-14 ENCOUNTER — Ambulatory Visit (INDEPENDENT_AMBULATORY_CARE_PROVIDER_SITE_OTHER): Payer: Medicare Other | Admitting: Internal Medicine

## 2012-06-15 ENCOUNTER — Ambulatory Visit (INDEPENDENT_AMBULATORY_CARE_PROVIDER_SITE_OTHER): Payer: Medicare Other | Admitting: Internal Medicine

## 2012-06-21 ENCOUNTER — Telehealth (HOSPITAL_COMMUNITY): Payer: Self-pay | Admitting: *Deleted

## 2012-06-21 ENCOUNTER — Encounter (HOSPITAL_COMMUNITY): Payer: Medicare Other | Attending: Oncology

## 2012-06-21 DIAGNOSIS — D509 Iron deficiency anemia, unspecified: Secondary | ICD-10-CM | POA: Diagnosis not present

## 2012-06-21 DIAGNOSIS — D649 Anemia, unspecified: Secondary | ICD-10-CM | POA: Diagnosis not present

## 2012-06-21 DIAGNOSIS — K922 Gastrointestinal hemorrhage, unspecified: Secondary | ICD-10-CM | POA: Diagnosis not present

## 2012-06-21 LAB — CBC
MCV: 83 fL (ref 78.0–100.0)
Platelets: 179 10*3/uL (ref 150–400)
RBC: 4.01 MIL/uL (ref 3.87–5.11)
RDW: 19.5 % — ABNORMAL HIGH (ref 11.5–15.5)
WBC: 8.4 10*3/uL (ref 4.0–10.5)

## 2012-06-21 NOTE — Telephone Encounter (Signed)
Pt arrived with daughter. C/o extreme weakness. States this is the "weakest I've been in a long time." Cbc and pink tube drawn and instructed pt that we will call her with results.

## 2012-06-21 NOTE — Telephone Encounter (Signed)
Notified pt and daughter that labs are good. She said she is feeling better now after eating something. Her daughter states she stopped the megace because of diarrhea. She will return for her scheduled labs the end of this month.

## 2012-06-21 NOTE — Progress Notes (Signed)
Labs drawn today for cbc 

## 2012-06-29 ENCOUNTER — Encounter (HOSPITAL_COMMUNITY): Payer: Medicare Other

## 2012-06-29 DIAGNOSIS — K922 Gastrointestinal hemorrhage, unspecified: Secondary | ICD-10-CM

## 2012-06-29 DIAGNOSIS — D509 Iron deficiency anemia, unspecified: Secondary | ICD-10-CM

## 2012-06-29 LAB — CBC
HCT: 33.7 % — ABNORMAL LOW (ref 36.0–46.0)
Hemoglobin: 11.9 g/dL — ABNORMAL LOW (ref 12.0–15.0)
MCHC: 35.3 g/dL (ref 30.0–36.0)
RBC: 4.04 MIL/uL (ref 3.87–5.11)
WBC: 7.7 10*3/uL (ref 4.0–10.5)

## 2012-06-30 LAB — FERRITIN: Ferritin: 61 ng/mL (ref 10–291)

## 2012-07-14 DIAGNOSIS — Z23 Encounter for immunization: Secondary | ICD-10-CM | POA: Diagnosis not present

## 2012-07-26 ENCOUNTER — Encounter (HOSPITAL_COMMUNITY): Payer: Medicare Other | Attending: Oncology

## 2012-07-26 DIAGNOSIS — K922 Gastrointestinal hemorrhage, unspecified: Secondary | ICD-10-CM | POA: Insufficient documentation

## 2012-07-26 DIAGNOSIS — D649 Anemia, unspecified: Secondary | ICD-10-CM | POA: Diagnosis not present

## 2012-07-26 DIAGNOSIS — D509 Iron deficiency anemia, unspecified: Secondary | ICD-10-CM | POA: Diagnosis not present

## 2012-07-26 DIAGNOSIS — D62 Acute posthemorrhagic anemia: Secondary | ICD-10-CM | POA: Diagnosis not present

## 2012-07-26 DIAGNOSIS — F329 Major depressive disorder, single episode, unspecified: Secondary | ICD-10-CM | POA: Diagnosis not present

## 2012-07-26 DIAGNOSIS — F3289 Other specified depressive episodes: Secondary | ICD-10-CM | POA: Insufficient documentation

## 2012-07-26 LAB — CBC
HCT: 32.5 % — ABNORMAL LOW (ref 36.0–46.0)
Hemoglobin: 10.7 g/dL — ABNORMAL LOW (ref 12.0–15.0)
MCV: 87.6 fL (ref 78.0–100.0)
RBC: 3.71 MIL/uL — ABNORMAL LOW (ref 3.87–5.11)
WBC: 7.4 10*3/uL (ref 4.0–10.5)

## 2012-07-26 LAB — IRON AND TIBC
Saturation Ratios: 13 % — ABNORMAL LOW (ref 20–55)
UIBC: 253 ug/dL (ref 125–400)

## 2012-07-26 NOTE — Progress Notes (Signed)
Labs drawn today for cbc,ferr,Iron and IBC 

## 2012-07-28 ENCOUNTER — Encounter (HOSPITAL_BASED_OUTPATIENT_CLINIC_OR_DEPARTMENT_OTHER): Payer: Medicare Other | Admitting: Oncology

## 2012-07-28 VITALS — BP 152/79 | HR 87 | Temp 96.0°F | Resp 20 | Wt 93.9 lb

## 2012-07-28 DIAGNOSIS — D62 Acute posthemorrhagic anemia: Secondary | ICD-10-CM

## 2012-07-28 DIAGNOSIS — F3289 Other specified depressive episodes: Secondary | ICD-10-CM

## 2012-07-28 DIAGNOSIS — D509 Iron deficiency anemia, unspecified: Secondary | ICD-10-CM

## 2012-07-28 DIAGNOSIS — F329 Major depressive disorder, single episode, unspecified: Secondary | ICD-10-CM

## 2012-07-28 MED ORDER — ESCITALOPRAM OXALATE 10 MG PO TABS: 10.0000 mg | ORAL_TABLET | Freq: Every day | ORAL | Status: DC

## 2012-07-28 NOTE — Progress Notes (Signed)
Patient needs another OV; missed first visit

## 2012-07-28 NOTE — Patient Instructions (Addendum)
[facility]  Discharge Instructions  RECOMMENDATIONS MADE BY THE CONSULTANT AND ANY TEST RESULTS WILL BE SENT TO YOUR REFERRING DOCTOR.   EXAM FINDINGS BY MD TODAY AND SIGNS AND SYMPTOMS TO REPORT TO CLINIC OR PRIMARY MD: Discussion by MD.  [name] have some depression and we want to try you on a medication.  You need to see Dr. Karilyn Cota again.  MEDICATIONS PRESCRIBED: Lexapro 10 mg take 1 pill daily to help with your depression.  Call us in 2 weeks and let us know how you are doing.[name], RN [phone])   INSTRUCTIONS GIVEN AND DISCUSSED: Other :  Call us in 2 weeks and let us know how your depression is.  Report increased fatigue, shortness of breath or other problems.  SPECIAL INSTRUCTIONS/FOLLOW-UP: Lab work Needed in 1 month and in 3 months and Return to Clinic in 3 months.   I acknowledge that I have been informed and understand all the instructions given to me and received a copy. I do not have any more questions at this time, but understand that I may call the Specialty Clinic at [facility] at [phone] during business hours should I have any further questions or need assistance in obtaining follow-up care.    __________________________________________  _____________  __________ Signature of Patient or Authorized Representative            Date                   Time    __________________________________________ Nurse's Signature

## 2012-07-28 NOTE — Progress Notes (Signed)
Diagnoses #1 severe microcytic anemia consistent with iron deficiency anemia. She has positive stools for blood and a history of AVMs in her stomach years ago. We sent her to Dr. Karilyn Cota and she went for the first appointment but did not go back. I have encouraged her daughter to make another appointment with him  She is here for the iron deficiency and needs IV iron once again since her ferritin is down to 18 it has dropped significantly in the last 2 months.  The biggest complaint however is she is very depressed. She is crying at times tearful, not getting out of the house, sleeping most of the day and night, not eating well. She has lost many friends and she feels as depressed as when she lost her husband many years ago. She refuses to see a psychologist or psychiatrist so I feel obligated to try her on an antidepressant in conjunction with the Tranxene that she takes on a rare basis. I will give her Lexapro 10 mg a day and her daughter will call me in 2 weeks. If this does not work or is not tolerated well we will switch to something else. This evaluation took about 25 minutes and was  basically counseling. She will receive he IV iron in the morning and we will check her CBC and ferritin level in one month and 3 months

## 2012-07-29 ENCOUNTER — Encounter (HOSPITAL_BASED_OUTPATIENT_CLINIC_OR_DEPARTMENT_OTHER): Payer: Medicare Other

## 2012-07-29 VITALS — BP 126/77 | HR 78 | Temp 97.5°F | Resp 20

## 2012-07-29 DIAGNOSIS — D509 Iron deficiency anemia, unspecified: Secondary | ICD-10-CM | POA: Diagnosis not present

## 2012-07-29 DIAGNOSIS — D649 Anemia, unspecified: Secondary | ICD-10-CM

## 2012-07-29 MED ORDER — SODIUM CHLORIDE 0.9 % IV SOLN
Freq: Once | INTRAVENOUS | Status: AC
  Administered 2012-07-29: 14:00:00 via INTRAVENOUS

## 2012-07-29 MED ORDER — SODIUM CHLORIDE 0.9 % IV SOLN
1020.0000 mg | Freq: Once | INTRAVENOUS | Status: DC
  Filled 2012-07-29: qty 34

## 2012-07-29 MED ORDER — SODIUM CHLORIDE 0.9 % IV SOLN
1020.0000 mg | Freq: Once | INTRAVENOUS | Status: AC
  Administered 2012-07-29: 1020 mg via INTRAVENOUS
  Filled 2012-07-29: qty 34

## 2012-07-29 MED ORDER — SODIUM CHLORIDE 0.9 % IJ SOLN
3.0000 mL | Freq: Once | INTRAMUSCULAR | Status: DC | PRN
  Filled 2012-07-29: qty 10

## 2012-07-29 NOTE — Progress Notes (Signed)
tolerated iron infusion without problems

## 2012-07-29 NOTE — Progress Notes (Signed)
Apt has been scheduled for 08/02/12 with Dorene Ar, NP.

## 2012-08-02 ENCOUNTER — Ambulatory Visit (INDEPENDENT_AMBULATORY_CARE_PROVIDER_SITE_OTHER): Payer: Medicare Other | Admitting: Internal Medicine

## 2012-08-02 ENCOUNTER — Encounter (INDEPENDENT_AMBULATORY_CARE_PROVIDER_SITE_OTHER): Payer: Self-pay | Admitting: Internal Medicine

## 2012-08-02 VITALS — BP 102/52 | HR 66 | Temp 97.6°F | Ht 59.0 in | Wt 93.7 lb

## 2012-08-02 DIAGNOSIS — K922 Gastrointestinal hemorrhage, unspecified: Secondary | ICD-10-CM

## 2012-08-02 NOTE — Progress Notes (Signed)
Subjective:     Patient ID: Kara Santos, female   DOB: Dec 27, 1916, 78 y.o.   MRN: [id number]  HPIReferred to our office by Dr. Mariel Sleet for iron deficiency anemia.  She was previously seen by [facility] for iron deficiency anemia. Her last iron transfusion was one yr ago. She is passing blood per rectum.   . She has a long history of iron deficiency, chronic GI bleeding with numerous small bowel AVMs and erosions seen at time of small bowel capsule in 2007 while on NSAIDs and aspirin. She has been seen at [facility] at [facility] and underwent a double-balloon enteroscopy with APC of 12 tiny vascular abnormality seen in the jejunum September 15,2009 per Dr. Luvenia Starch notes. It was estimated about 6 feet of jejunum was inspected. (Scope could not be advanced any further at that point) anemia   08/2006 Given Capsule: SUMMARY AND RECOMMENDATIONS: A few small gastric erosions. Numerous  small bowel AVMs as well as small bowel erosions, none with evidence of  active bleeding at the time of study. The study was reviewed by Dr.  Jena Gauss. These small bowel findings could easily explain the patient's  iron deficiency anemia. I would recommend that she avoid aspirin and  NSAIDs if possible. Would continue to check periodic hemoglobin and  hematocrit. 08/2006 EGD and Colonoscopy:  CBCwell and was reactive to endoscopy.  COLONOSCOPY IMPRESSION: Normal rectum. Left-sided diverticulum and colonic  mucosa appeared normal.  EGD FINDINGS: Normal esophagus, small hiatal hernia, otherwise normal  stomach. Large duodenal, diverticulum, and D2, otherwise normal D1 and D2.  No explanation for patient's iron-deficiency anemia, based on today's     Component Value Date/Time   WBC 7.4 07/26/2012 1100   RBC 3.71* 07/26/2012 1100   HGB 10.7* 07/26/2012 1100   HCT 32.5* 07/26/2012 1100   PLT 163 07/26/2012 1100   MCV 87.6 07/26/2012 1100   MCH 28.8 07/26/2012 1100   MCHC 32.9 07/26/2012 1100   RDW  16.1* 07/26/2012 1100   LYMPHSABS 1.7 05/04/2012 1740   MONOABS 0.5 05/04/2012 1740   EOSABS 0.1 05/04/2012 1740   BASOSABS 0.1 05/04/2012 1740      Review of Systems see hpi Current Outpatient Prescriptions  Medication Sig Dispense Refill  . ACIPHEX 20 MG tablet Take 20 mg by mouth Daily.       . clorazepate (TRANXENE) 7.5 MG tablet Take 7.5 mg by mouth 3 (three) times daily as needed. Nerves      . cyanocobalamin 1000 MCG tablet Take 100 mcg by mouth daily.      [name] Kitchen escitalopram (LEXAPRO) 10 MG tablet Take 1 tablet (10 mg total) by mouth daily.  30 tablet  0  . levothyroxine (SYNTHROID, LEVOTHROID) 100 MCG tablet Take 100 mcg by mouth daily.      . metFORMIN (GLUMETZA) 500 MG (MOD) 24 hr tablet Take 1,000 mg by mouth 2 (two) times daily.      . Multiple Vitamin (MULITIVITAMIN WITH MINERALS) TABS Take 1 tablet by mouth daily.      [name] Kitchen OVER THE COUNTER MEDICATION Take 1 tablet by mouth daily as needed. A pain medication.      [name] Kitchen PROPOXYPHENE HCL PO Take by mouth 4 (four) times daily as needed.        Past Medical History  Diagnosis Date  . Anemia 11/07/2011    chronic IDA requiring multiple transfusions  . Diabetes mellitus   . Hypothyroidism     s/p thyroid surgery for thyroid cancer  .  GERD (gastroesophageal reflux disease)   . Iron deficiency anemia 05/05/2012        Objective:   Physical Exam Filed Vitals:   08/02/12 1458  BP: 102/52  Pulse: 66  Temp: 97.6 F (36.4 C)  Height: 4\' 11"  (1.499 m)  Weight: 93 lb 11.2 oz (42.502 kg)  Alert and oriented. Skin warm and dry. Oral mucosa is moist.   . Sclera anicteric, conjunctivae is pink. Thyroid not enlarged. No cervical lymphadenopathy. Lungs clear. Heart regular rate and rhythm.  Abdomen is soft. Bowel sounds are positive. No hepatomegaly. No abdominal masses felt. No tenderness.  No edema to lower extremities.  Patient declined a rectal exam.     Assessment:   GI bleed, probably from AVM to the jejunum. I discussed this case  with Dr. Karilyn Cota. Patient has declined any type of intervention (colonoscopy, EGD, or referral to [facility] for APC of jejunal AVMs)    Plan:    Advised patient if she has increased rectal bleeding to call our office or go to the ED.

## 2012-08-02 NOTE — Patient Instructions (Addendum)
If any increased rectal bleeding, call our office or go to the ED.

## 2012-08-16 ENCOUNTER — Telehealth (HOSPITAL_COMMUNITY): Payer: Self-pay

## 2012-08-16 NOTE — Telephone Encounter (Signed)
Call from [place] stated "The depression medication (Lexapro) that Dr. Mariel Sleet put mother on is working well.  She is doing so much better since she started it."

## 2012-08-23 ENCOUNTER — Other Ambulatory Visit (HOSPITAL_COMMUNITY): Payer: Self-pay | Admitting: Oncology

## 2012-08-23 DIAGNOSIS — D62 Acute posthemorrhagic anemia: Secondary | ICD-10-CM

## 2012-08-23 DIAGNOSIS — F32A Depression, unspecified: Secondary | ICD-10-CM

## 2012-08-23 DIAGNOSIS — F329 Major depressive disorder, single episode, unspecified: Secondary | ICD-10-CM

## 2012-08-23 MED ORDER — ESCITALOPRAM OXALATE 10 MG PO TABS: 10.0000 mg | ORAL_TABLET | Freq: Every day | ORAL | Status: DC

## 2012-08-30 ENCOUNTER — Encounter (HOSPITAL_COMMUNITY): Payer: Medicare Other | Attending: Oncology

## 2012-08-30 DIAGNOSIS — D649 Anemia, unspecified: Secondary | ICD-10-CM | POA: Diagnosis not present

## 2012-08-30 DIAGNOSIS — D62 Acute posthemorrhagic anemia: Secondary | ICD-10-CM | POA: Insufficient documentation

## 2012-08-30 DIAGNOSIS — F3289 Other specified depressive episodes: Secondary | ICD-10-CM | POA: Insufficient documentation

## 2012-08-30 DIAGNOSIS — F329 Major depressive disorder, single episode, unspecified: Secondary | ICD-10-CM | POA: Diagnosis not present

## 2012-08-30 LAB — CBC
MCH: 31.9 pg (ref 26.0–34.0)
MCHC: 34.4 g/dL (ref 30.0–36.0)
MCV: 92.7 fL (ref 78.0–100.0)
Platelets: 185 10*3/uL (ref 150–400)

## 2012-08-30 LAB — FERRITIN: Ferritin: 260 ng/mL (ref 10–291)

## 2012-08-30 NOTE — Progress Notes (Signed)
Labs drawn today for cbc,ferr,

## 2012-10-25 ENCOUNTER — Encounter (HOSPITAL_COMMUNITY): Payer: Medicare Other | Attending: Oncology

## 2012-10-25 DIAGNOSIS — R51 Headache: Secondary | ICD-10-CM | POA: Insufficient documentation

## 2012-10-25 DIAGNOSIS — D509 Iron deficiency anemia, unspecified: Secondary | ICD-10-CM | POA: Diagnosis not present

## 2012-10-25 DIAGNOSIS — D62 Acute posthemorrhagic anemia: Secondary | ICD-10-CM | POA: Insufficient documentation

## 2012-10-25 DIAGNOSIS — F3289 Other specified depressive episodes: Secondary | ICD-10-CM | POA: Insufficient documentation

## 2012-10-25 DIAGNOSIS — F329 Major depressive disorder, single episode, unspecified: Secondary | ICD-10-CM | POA: Insufficient documentation

## 2012-10-25 DIAGNOSIS — D649 Anemia, unspecified: Secondary | ICD-10-CM | POA: Diagnosis not present

## 2012-10-25 LAB — CBC
HCT: 29.2 % — ABNORMAL LOW (ref 36.0–46.0)
MCH: 30.1 pg (ref 26.0–34.0)
MCHC: 32.9 g/dL (ref 30.0–36.0)
RDW: 14.7 % (ref 11.5–15.5)

## 2012-10-25 NOTE — Progress Notes (Signed)
Labs drawn today for cbc,ferr 

## 2012-10-27 ENCOUNTER — Encounter (HOSPITAL_BASED_OUTPATIENT_CLINIC_OR_DEPARTMENT_OTHER): Payer: Medicare Other

## 2012-10-27 ENCOUNTER — Encounter (HOSPITAL_COMMUNITY): Payer: Self-pay | Admitting: Oncology

## 2012-10-27 ENCOUNTER — Encounter (HOSPITAL_BASED_OUTPATIENT_CLINIC_OR_DEPARTMENT_OTHER): Payer: Medicare Other | Admitting: Oncology

## 2012-10-27 VITALS — BP 117/68 | HR 83 | Temp 97.4°F | Resp 18 | Wt 93.9 lb

## 2012-10-27 DIAGNOSIS — D509 Iron deficiency anemia, unspecified: Secondary | ICD-10-CM | POA: Diagnosis not present

## 2012-10-27 DIAGNOSIS — R51 Headache: Secondary | ICD-10-CM

## 2012-10-27 MED ORDER — SODIUM CHLORIDE 0.9 % IV SOLN
1020.0000 mg | Freq: Once | INTRAVENOUS | Status: AC
  Administered 2012-10-27: 1020 mg via INTRAVENOUS
  Filled 2012-10-27: qty 34

## 2012-10-27 MED ORDER — SODIUM CHLORIDE 0.9 % IV SOLN
Freq: Once | INTRAVENOUS | Status: AC
  Administered 2012-10-27: 14:00:00 via INTRAVENOUS

## 2012-10-27 MED ORDER — SODIUM CHLORIDE 0.9 % IJ SOLN
10.0000 mL | INTRAMUSCULAR | Status: DC | PRN
  Administered 2012-10-27: 10 mL
  Filled 2012-10-27: qty 10

## 2012-10-27 NOTE — Patient Instructions (Addendum)
[facility] Discharge Instructions  RECOMMENDATIONS MADE BY THE CONSULTANT AND ANY TEST RESULTS WILL BE SENT TO YOUR REFERRING PHYSICIAN.  EXAM FINDINGS BY THE PHYSICIAN TODAY AND SIGNS OR SYMPTOMS TO REPORT TO CLINIC OR PRIMARY PHYSICIAN: Discussion by MD.  Need to do a CT of your head to see what's if we can get an answer to your headaches and the sounds that you are hearing.  Will give you an iron infusion today.  MEDICATIONS PRESCRIBED:  none  INSTRUCTIONS GIVEN AND DISCUSSED: Report increased fatigue or shortness of breath  SPECIAL INSTRUCTIONS/FOLLOW-UP: Blood work every 8 weeks and to return to see MD in 4 months.  Thank you for choosing [facility] to provide your oncology and hematology care.  To afford each patient quality time with our providers, please arrive at least 15 minutes before your scheduled appointment time.  With your help, our goal is to use those 15 minutes to complete the necessary work-up to ensure our physicians have the information they need to help with your evaluation and healthcare recommendations.    Effective January 1st, 2014, we ask that you re-schedule your appointment with our physicians should you arrive 10 or more minutes late for your appointment.  We strive to give you quality time with our providers, and arriving late affects you and other patients whose appointments are after yours.    Again, thank you for choosing [facility].  Our hope is that these requests will decrease the amount of time that you wait before being seen by our physicians.       _____________________________________________________________  Should you have questions after your visit to [facility], please contact our office at [phone] between the hours of 8:30 a.m. and 5:00 p.m.  Voicemails left after 4:30 p.m. will not be returned until the following business day.  For prescription refill requests, have your  pharmacy contact our office with your prescription refill request.

## 2012-10-27 NOTE — Progress Notes (Signed)
Number 1 severe microcytic anemia consistent with iron deficiency requiring intravenous iron. She is blood loss secondary to AVMs within her stomach and possibly other areas. She continues to have rapid decreases in her ferritin levels area. Since August she has had 3 infusions of 1020 mg of feraheme and will receive another infusion today. Her most recent ferritin level on 10/25/2012 to 28 down from 260 on 08/30/2012. The goal is to keep her hemoglobin above 9.5 g ideally. It was 9.6 g a day. Her white count plus remain within the normal range and she feels better than she has felt and year she states it's been placed on the IV iron regimen.  I may need to talk to Dr. Karilyn Cota about whether she needs to be scoped again. Otherwise we need to support her as we are doing. She is very happy with this plan.

## 2012-10-28 ENCOUNTER — Ambulatory Visit (HOSPITAL_COMMUNITY): Payer: Medicare Other

## 2012-10-29 ENCOUNTER — Ambulatory Visit (HOSPITAL_COMMUNITY)
Admission: RE | Admit: 2012-10-29 | Discharge: 2012-10-29 | Disposition: A | Discharge status: Home / Self Care | Payer: Medicare Other | Source: Ambulatory Visit | Attending: Oncology | Admitting: Oncology

## 2012-10-29 DIAGNOSIS — Z9181 History of falling: Secondary | ICD-10-CM | POA: Diagnosis not present

## 2012-10-29 DIAGNOSIS — I6200 Nontraumatic subdural hemorrhage, unspecified: Secondary | ICD-10-CM | POA: Diagnosis not present

## 2012-10-29 DIAGNOSIS — G319 Degenerative disease of nervous system, unspecified: Secondary | ICD-10-CM | POA: Diagnosis not present

## 2012-10-29 DIAGNOSIS — R443 Hallucinations, unspecified: Secondary | ICD-10-CM | POA: Insufficient documentation

## 2012-10-29 DIAGNOSIS — R51 Headache: Secondary | ICD-10-CM | POA: Diagnosis not present

## 2012-10-29 DIAGNOSIS — S065X9A Traumatic subdural hemorrhage with loss of consciousness of unspecified duration, initial encounter: Secondary | ICD-10-CM | POA: Diagnosis not present

## 2012-11-14 NOTE — Progress Notes (Signed)
Patient was seen her office on 08/02/2012 and was not interested in further GI evaluation or repeat studies. If she is I would be glad to see her again

## 2012-11-29 ENCOUNTER — Encounter (HOSPITAL_COMMUNITY): Payer: Medicare Other | Attending: Oncology

## 2012-11-29 DIAGNOSIS — K922 Gastrointestinal hemorrhage, unspecified: Secondary | ICD-10-CM | POA: Diagnosis not present

## 2012-11-29 LAB — CBC
Hemoglobin: 10.4 g/dL — ABNORMAL LOW (ref 12.0–15.0)
MCH: 31.6 pg (ref 26.0–34.0)
MCHC: 33.9 g/dL (ref 30.0–36.0)
Platelets: 144 10*3/uL — ABNORMAL LOW (ref 150–400)
RBC: 3.29 MIL/uL — ABNORMAL LOW (ref 3.87–5.11)

## 2012-11-29 LAB — IRON AND TIBC
Iron: 64 ug/dL (ref 42–135)
UIBC: 165 ug/dL (ref 125–400)

## 2012-11-29 LAB — FERRITIN: Ferritin: 231 ng/mL (ref 10–291)

## 2012-11-29 NOTE — Progress Notes (Signed)
Labs drawn today for cbc,ferr,Iron and IBC 

## 2012-12-20 ENCOUNTER — Encounter (HOSPITAL_COMMUNITY): Payer: Medicare Other | Attending: Oncology

## 2012-12-20 DIAGNOSIS — D509 Iron deficiency anemia, unspecified: Secondary | ICD-10-CM

## 2012-12-20 DIAGNOSIS — K922 Gastrointestinal hemorrhage, unspecified: Secondary | ICD-10-CM | POA: Diagnosis not present

## 2012-12-20 LAB — CBC
HCT: 31.6 % — ABNORMAL LOW (ref 36.0–46.0)
Hemoglobin: 10.6 g/dL — ABNORMAL LOW (ref 12.0–15.0)
MCH: 30.7 pg (ref 26.0–34.0)
MCHC: 33.5 g/dL (ref 30.0–36.0)
RDW: 15.6 % — ABNORMAL HIGH (ref 11.5–15.5)

## 2012-12-20 LAB — IRON AND TIBC: Iron: 56 ug/dL (ref 42–135)

## 2012-12-20 NOTE — Progress Notes (Signed)
Labs drawn today for cbc,ferr,Iron and IBC 

## 2012-12-21 ENCOUNTER — Other Ambulatory Visit (HOSPITAL_COMMUNITY): Payer: Self-pay | Admitting: *Deleted

## 2012-12-21 LAB — FERRITIN: Ferritin: 71 ng/mL (ref 10–291)

## 2012-12-30 ENCOUNTER — Encounter (HOSPITAL_BASED_OUTPATIENT_CLINIC_OR_DEPARTMENT_OTHER): Payer: Medicare Other

## 2012-12-30 VITALS — BP 150/72 | HR 82 | Temp 97.6°F | Resp 16

## 2012-12-30 DIAGNOSIS — D509 Iron deficiency anemia, unspecified: Secondary | ICD-10-CM | POA: Diagnosis not present

## 2012-12-30 MED ORDER — SODIUM CHLORIDE 0.9 % IV SOLN
INTRAVENOUS | Status: DC
  Administered 2012-12-30: 11:00:00 via INTRAVENOUS

## 2012-12-30 MED ORDER — SODIUM CHLORIDE 0.9 % IJ SOLN
10.0000 mL | INTRAMUSCULAR | Status: DC | PRN
  Filled 2012-12-30: qty 10

## 2012-12-30 MED ORDER — SODIUM CHLORIDE 0.9 % IV SOLN
1020.0000 mg | Freq: Once | INTRAVENOUS | Status: AC
  Administered 2012-12-30: 1020 mg via INTRAVENOUS
  Filled 2012-12-30: qty 34

## 2013-02-03 DIAGNOSIS — S72113A Displaced fracture of greater trochanter of unspecified femur, initial encounter for closed fracture: Secondary | ICD-10-CM

## 2013-02-03 HISTORY — DX: Displaced fracture of greater trochanter of unspecified femur, initial encounter for closed fracture: S72.113A

## 2013-02-06 ENCOUNTER — Emergency Department (HOSPITAL_COMMUNITY): Payer: Medicare Other

## 2013-02-06 ENCOUNTER — Inpatient Hospital Stay (HOSPITAL_COMMUNITY)
Admission: EM | Admit: 2013-02-06 | Discharge: 2013-02-08 | DRG: 536 | Disposition: A | Discharge status: Rehabilitation Facility | Payer: Medicare Other | Attending: Pulmonary Disease | Admitting: Pulmonary Disease

## 2013-02-06 ENCOUNTER — Encounter (HOSPITAL_COMMUNITY): Payer: Self-pay | Admitting: Emergency Medicine

## 2013-02-06 DIAGNOSIS — M542 Cervicalgia: Secondary | ICD-10-CM | POA: Diagnosis not present

## 2013-02-06 DIAGNOSIS — F329 Major depressive disorder, single episode, unspecified: Secondary | ICD-10-CM | POA: Diagnosis present

## 2013-02-06 DIAGNOSIS — Z85850 Personal history of malignant neoplasm of thyroid: Secondary | ICD-10-CM

## 2013-02-06 DIAGNOSIS — K219 Gastro-esophageal reflux disease without esophagitis: Secondary | ICD-10-CM | POA: Diagnosis present

## 2013-02-06 DIAGNOSIS — Z5189 Encounter for other specified aftercare: Secondary | ICD-10-CM | POA: Diagnosis not present

## 2013-02-06 DIAGNOSIS — S72009A Fracture of unspecified part of neck of unspecified femur, initial encounter for closed fracture: Secondary | ICD-10-CM | POA: Diagnosis not present

## 2013-02-06 DIAGNOSIS — I10 Essential (primary) hypertension: Secondary | ICD-10-CM | POA: Diagnosis not present

## 2013-02-06 DIAGNOSIS — S72109A Unspecified trochanteric fracture of unspecified femur, initial encounter for closed fracture: Secondary | ICD-10-CM | POA: Diagnosis not present

## 2013-02-06 DIAGNOSIS — Z833 Family history of diabetes mellitus: Secondary | ICD-10-CM

## 2013-02-06 DIAGNOSIS — D649 Anemia, unspecified: Secondary | ICD-10-CM | POA: Diagnosis not present

## 2013-02-06 DIAGNOSIS — D509 Iron deficiency anemia, unspecified: Secondary | ICD-10-CM | POA: Diagnosis present

## 2013-02-06 DIAGNOSIS — K922 Gastrointestinal hemorrhage, unspecified: Secondary | ICD-10-CM | POA: Diagnosis present

## 2013-02-06 DIAGNOSIS — Z809 Family history of malignant neoplasm, unspecified: Secondary | ICD-10-CM | POA: Diagnosis not present

## 2013-02-06 DIAGNOSIS — M25559 Pain in unspecified hip: Secondary | ICD-10-CM | POA: Diagnosis not present

## 2013-02-06 DIAGNOSIS — S0990XA Unspecified injury of head, initial encounter: Secondary | ICD-10-CM | POA: Diagnosis not present

## 2013-02-06 DIAGNOSIS — E890 Postprocedural hypothyroidism: Secondary | ICD-10-CM | POA: Diagnosis present

## 2013-02-06 DIAGNOSIS — R5381 Other malaise: Secondary | ICD-10-CM | POA: Diagnosis not present

## 2013-02-06 DIAGNOSIS — F3289 Other specified depressive episodes: Secondary | ICD-10-CM | POA: Diagnosis present

## 2013-02-06 DIAGNOSIS — S298XXA Other specified injuries of thorax, initial encounter: Secondary | ICD-10-CM | POA: Diagnosis not present

## 2013-02-06 DIAGNOSIS — S72112D Displaced fracture of greater trochanter of left femur, subsequent encounter for closed fracture with routine healing: Secondary | ICD-10-CM

## 2013-02-06 DIAGNOSIS — E871 Hypo-osmolality and hyponatremia: Secondary | ICD-10-CM | POA: Diagnosis not present

## 2013-02-06 DIAGNOSIS — Z9181 History of falling: Secondary | ICD-10-CM

## 2013-02-06 DIAGNOSIS — Z79899 Other long term (current) drug therapy: Secondary | ICD-10-CM

## 2013-02-06 DIAGNOSIS — W010XXA Fall on same level from slipping, tripping and stumbling without subsequent striking against object, initial encounter: Secondary | ICD-10-CM | POA: Diagnosis present

## 2013-02-06 DIAGNOSIS — Z01810 Encounter for preprocedural cardiovascular examination: Secondary | ICD-10-CM | POA: Diagnosis not present

## 2013-02-06 DIAGNOSIS — IMO0001 Reserved for inherently not codable concepts without codable children: Secondary | ICD-10-CM | POA: Diagnosis not present

## 2013-02-06 DIAGNOSIS — S72112A Displaced fracture of greater trochanter of left femur, initial encounter for closed fracture: Secondary | ICD-10-CM

## 2013-02-06 DIAGNOSIS — S72113A Displaced fracture of greater trochanter of unspecified femur, initial encounter for closed fracture: Secondary | ICD-10-CM

## 2013-02-06 DIAGNOSIS — S72109B Unspecified trochanteric fracture of unspecified femur, initial encounter for open fracture type I or II: Secondary | ICD-10-CM | POA: Diagnosis not present

## 2013-02-06 DIAGNOSIS — D638 Anemia in other chronic diseases classified elsewhere: Secondary | ICD-10-CM | POA: Diagnosis not present

## 2013-02-06 DIAGNOSIS — E876 Hypokalemia: Secondary | ICD-10-CM | POA: Diagnosis present

## 2013-02-06 DIAGNOSIS — S72009D Fracture of unspecified part of neck of unspecified femur, subsequent encounter for closed fracture with routine healing: Secondary | ICD-10-CM | POA: Diagnosis not present

## 2013-02-06 DIAGNOSIS — T148XXA Other injury of unspecified body region, initial encounter: Secondary | ICD-10-CM | POA: Diagnosis not present

## 2013-02-06 DIAGNOSIS — E119 Type 2 diabetes mellitus without complications: Secondary | ICD-10-CM

## 2013-02-06 DIAGNOSIS — E039 Hypothyroidism, unspecified: Secondary | ICD-10-CM | POA: Diagnosis present

## 2013-02-06 DIAGNOSIS — T1490XA Injury, unspecified, initial encounter: Secondary | ICD-10-CM | POA: Diagnosis not present

## 2013-02-06 DIAGNOSIS — Y92009 Unspecified place in unspecified non-institutional (private) residence as the place of occurrence of the external cause: Secondary | ICD-10-CM

## 2013-02-06 LAB — COMPREHENSIVE METABOLIC PANEL WITH GFR
ALT: 17 U/L (ref 0–35)
AST: 33 U/L (ref 0–37)
Albumin: 3.5 g/dL (ref 3.5–5.2)
Alkaline Phosphatase: 71 U/L (ref 39–117)
BUN: 15 mg/dL (ref 6–23)
CO2: 23 meq/L (ref 19–32)
Calcium: 7.4 mg/dL — ABNORMAL LOW (ref 8.4–10.5)
Chloride: 101 meq/L (ref 96–112)
Creatinine, Ser: 0.64 mg/dL (ref 0.50–1.10)
GFR calc Af Amer: 86 mL/min — ABNORMAL LOW (ref >90)
GFR calc non Af Amer: 74 mL/min — ABNORMAL LOW (ref >90)
Glucose, Bld: 127 mg/dL — ABNORMAL HIGH (ref 70–99)
Potassium: 3.3 meq/L — ABNORMAL LOW (ref 3.5–5.1)
Sodium: 141 meq/L (ref 135–145)
Total Bilirubin: 0.4 mg/dL (ref 0.3–1.2)
Total Protein: 6 g/dL (ref 6.0–8.3)

## 2013-02-06 LAB — CBC WITH DIFFERENTIAL/PLATELET
Basophils Relative: 1 % (ref 0–1)
HCT: 26.9 % — ABNORMAL LOW (ref 36.0–46.0)
Hemoglobin: 9.7 g/dL — ABNORMAL LOW (ref 12.0–15.0)
MCH: 34 pg (ref 26.0–34.0)
MCHC: 36.1 g/dL — ABNORMAL HIGH (ref 30.0–36.0)
MCV: 94.4 fL (ref 78.0–100.0)
Monocytes Absolute: 0.4 10*3/uL (ref 0.1–1.0)
Monocytes Relative: 6 % (ref 3–12)
Neutro Abs: 5.3 10*3/uL (ref 1.7–7.7)

## 2013-02-06 LAB — PROTIME-INR: Prothrombin Time: 13.8 seconds (ref 11.6–15.2)

## 2013-02-06 LAB — TROPONIN I: Troponin I: <0.3 ng/mL (ref <0.30)

## 2013-02-06 LAB — GLUCOSE, CAPILLARY: Glucose-Capillary: 109 mg/dL — ABNORMAL HIGH (ref 70–99)

## 2013-02-06 MED ORDER — ESCITALOPRAM OXALATE 10 MG PO TABS
10.0000 mg | ORAL_TABLET | Freq: Every day | ORAL | Status: DC
  Administered 2013-02-07 – 2013-02-08 (×2): 10 mg via ORAL
  Filled 2013-02-06 (×2): qty 1

## 2013-02-06 MED ORDER — POTASSIUM CHLORIDE CRYS ER 20 MEQ PO TBCR
40.0000 meq | EXTENDED_RELEASE_TABLET | Freq: Once | ORAL | Status: AC
  Administered 2013-02-06: 40 meq via ORAL
  Filled 2013-02-06: qty 2

## 2013-02-06 MED ORDER — SODIUM CHLORIDE 0.9 % IV SOLN: INTRAVENOUS | Status: DC

## 2013-02-06 MED ORDER — HYDROCODONE-ACETAMINOPHEN 5-325 MG PO TABS
1.0000 | ORAL_TABLET | Freq: Four times a day (QID) | ORAL | Status: DC | PRN
  Administered 2013-02-06 – 2013-02-08 (×5): 1 via ORAL
  Filled 2013-02-06 (×5): qty 1

## 2013-02-06 MED ORDER — HYDROMORPHONE HCL PF 1 MG/ML IJ SOLN: 0.5000 mg | INTRAMUSCULAR | Status: DC | PRN

## 2013-02-06 MED ORDER — PANTOPRAZOLE SODIUM 40 MG PO TBEC
40.0000 mg | DELAYED_RELEASE_TABLET | Freq: Every day | ORAL | Status: DC
  Administered 2013-02-07 – 2013-02-08 (×2): 40 mg via ORAL
  Filled 2013-02-06 (×2): qty 1

## 2013-02-06 MED ORDER — MORPHINE SULFATE 2 MG/ML IJ SOLN: 0.5000 mg | INTRAMUSCULAR | Status: DC | PRN

## 2013-02-06 MED ORDER — SODIUM CHLORIDE 0.9 % IV SOLN: 250.0000 mL | INTRAVENOUS | Status: DC | PRN

## 2013-02-06 MED ORDER — SODIUM CHLORIDE 0.9 % IJ SOLN: 3.0000 mL | INTRAMUSCULAR | Status: DC | PRN

## 2013-02-06 MED ORDER — FENTANYL CITRATE 0.05 MG/ML IJ SOLN
50.0000 ug | Freq: Once | INTRAMUSCULAR | Status: AC
  Administered 2013-02-06: 50 ug via INTRAVENOUS
  Filled 2013-02-06: qty 2

## 2013-02-06 MED ORDER — ONDANSETRON HCL 4 MG/2ML IJ SOLN: 4.0000 mg | Freq: Three times a day (TID) | INTRAMUSCULAR | Status: DC | PRN

## 2013-02-06 MED ORDER — ENOXAPARIN SODIUM 40 MG/0.4ML ~~LOC~~ SOLN
40.0000 mg | SUBCUTANEOUS | Status: DC
  Administered 2013-02-06: 40 mg via SUBCUTANEOUS
  Filled 2013-02-06: qty 0.4

## 2013-02-06 MED ORDER — LEVOTHYROXINE SODIUM 100 MCG PO TABS
100.0000 ug | ORAL_TABLET | Freq: Every day | ORAL | Status: DC
  Administered 2013-02-07 – 2013-02-08 (×2): 100 ug via ORAL
  Filled 2013-02-06 (×2): qty 1

## 2013-02-06 MED ORDER — INSULIN ASPART 100 UNIT/ML ~~LOC~~ SOLN
0.0000 [IU] | Freq: Three times a day (TID) | SUBCUTANEOUS | Status: DC
  Administered 2013-02-07: 2 [IU] via SUBCUTANEOUS

## 2013-02-06 MED ORDER — SODIUM CHLORIDE 0.9 % IJ SOLN
3.0000 mL | Freq: Two times a day (BID) | INTRAMUSCULAR | Status: DC
  Administered 2013-02-06 – 2013-02-07 (×3): 3 mL via INTRAVENOUS

## 2013-02-06 NOTE — ED Notes (Signed)
Report given to Misty Stanley, RN unit 200. Ready for patient.

## 2013-02-06 NOTE — Consult Note (Signed)
Reason for Consult:Fracture left greater trochanter Referring Physician: Hospitalist  Kara Santos is an 79 y.o. female.  HPI: She fell at home on Tuesday, six days ago.   She has had pain in the left hip.  She has decreased her activity.  She has no walker. She fell again today and was unable to stand.  She has had pain in the hip to any motion.  She has no other injury.  She is mentally alert and said "I fell and I was completely sober, ha ha"  She does not know why she fell.  Past Medical History  Diagnosis Date  . Anemia 11/07/2011    chronic IDA requiring multiple transfusions  . Diabetes mellitus   . Hypothyroidism     s/p thyroid surgery for thyroid cancer  . GERD (gastroesophageal reflux disease)   . Iron deficiency anemia 05/05/2012  . Depression     Past Surgical History  Procedure Laterality Date  . Esophagogastroduodenoscopy  08/2006    Dr. Ronni Rumble [facility], large duodenal diverticulum  . Colonoscopy  08/2006    Dr. Lolita Patella sided diverticulosis  . Givens small bowel capsule  08/2006    few gastric erosions, numerous small bowel AVMs with SB erosions  . Double balloon enteroscopy  06/1008    [facility]-->APC of 12 SB AVMs in jejunum. 6 feet of jejunum inspected  . Right shoulder surgery    . Cholecystectomy    . Appendectomy    . Bilateral cataract extractions    . Breast reduction surgery    . Thyroid surgery      thyroid cancer    Family History  Problem Relation Age of Onset  . Cancer Mother   . Cancer Father   . Diabetes Father   . Diabetes Sister   . Cancer Brother     Social History:  reports that she has never smoked. She has never used smokeless tobacco. She reports that she does not drink alcohol or use illicit drugs.  Allergies: No Known Allergies  Medications: I have reviewed the patient's current medications.  Results for orders placed during the hospital encounter of 02/06/13 (from the past 48 hour(s))  CBC WITH DIFFERENTIAL     Status:  Abnormal   Collection Time    02/06/13  1:28 PM      Result Value Range   WBC 7.0  4.0 - 10.5 K/uL   RBC 2.85 (*) 3.87 - 5.11 MIL/uL   Hemoglobin 9.7 (*) 12.0 - 15.0 g/dL   HCT 16.1 (*) 09.6 - 04.5 %   MCV 94.4  78.0 - 100.0 fL   MCH 34.0  26.0 - 34.0 pg   MCHC 36.1 (*) 30.0 - 36.0 g/dL   RDW 40.9 (*) 81.1 - 91.4 %   Platelets 164  150 - 400 K/uL   Neutrophils Relative 75  43 - 77 %   Neutro Abs 5.3  1.7 - 7.7 K/uL   Lymphocytes Relative 17  12 - 46 %   Lymphs Abs 1.2  0.7 - 4.0 K/uL   Monocytes Relative 6  3 - 12 %   Monocytes Absolute 0.4  0.1 - 1.0 K/uL   Eosinophils Relative 1  0 - 5 %   Eosinophils Absolute 0.1  0.0 - 0.7 K/uL   Basophils Relative 1  0 - 1 %   Basophils Absolute 0.0  0.0 - 0.1 K/uL  COMPREHENSIVE METABOLIC PANEL     Status: Abnormal   Collection Time    02/06/13  1:28 PM      Result Value Range   Sodium 141  135 - 145 mEq/L   Potassium 3.3 (*) 3.5 - 5.1 mEq/L   Chloride 101  96 - 112 mEq/L   CO2 23  19 - 32 mEq/L   Glucose, Bld 127 (*) 70 - 99 mg/dL   BUN 15  6 - 23 mg/dL   Creatinine, Ser 9.60  0.50 - 1.10 mg/dL   Calcium 7.4 (*) 8.4 - 10.5 mg/dL   Total Protein 6.0  6.0 - 8.3 g/dL   Albumin 3.5  3.5 - 5.2 g/dL   AST 33  0 - 37 U/L   ALT 17  0 - 35 U/L   Alkaline Phosphatase 71  39 - 117 U/L   Total Bilirubin 0.4  0.3 - 1.2 mg/dL   GFR calc non Af Amer 74 (*) >90 mL/min   GFR calc Af Amer 86 (*) >90 mL/min   Comment:            The eGFR has been calculated     using the CKD EPI equation.     This calculation has not been     validated in all clinical     situations.     eGFR's persistently     <90 mL/min signify     possible Chronic Kidney Disease.  TROPONIN I     Status: None   Collection Time    02/06/13  1:28 PM      Result Value Range   Troponin I <0.30  <0.30 ng/mL   Comment:            Due to the release kinetics of cTnI,     a negative result within the first hours     of the onset of symptoms does not rule out     myocardial  infarction with certainty.     If myocardial infarction is still suspected,     repeat the test at appropriate intervals.  PROTIME-INR     Status: None   Collection Time    02/06/13  1:28 PM      Result Value Range   Prothrombin Time 13.8  11.6 - 15.2 seconds   INR 1.07  0.00 - 1.49    Dg Chest 1 View  02/06/2013  *RADIOLOGY REPORT*  Clinical Data: Fall  CHEST - 1 VIEW  Comparison: 11/21/2009  Findings: Cardiomediastinal silhouette is stable.  No acute infiltrate or pleural effusion.  No pulmonary edema.  Bony thorax is unremarkable.  IMPRESSION: No active disease.  No significant change.   Original Report Authenticated By: Natasha Mead, M.D.    Dg Hip Complete Left  02/06/2013  *RADIOLOGY REPORT*  Clinical Data: Pelvic pain  LEFT HIP - COMPLETE 2+ VIEW  Comparison: None.  Findings: Nondisplaced fracture is seen involving the greater trochanter of the proximal left femur.  No dislocation is noted. The proximal left femoral head and neck appear normal.  Narrowing of both hip joints is noted consistent with degenerative joint disease.  IMPRESSION: Nondisplaced fracture involving greater trochanter of the proximal left femur.   Original Report Authenticated By: Lupita Raider.,  M.D.    Ct Head Wo Contrast  02/06/2013  *RADIOLOGY REPORT*  Clinical Data:  Fall with neck pain.  CT HEAD WITHOUT CONTRAST CT CERVICAL SPINE WITHOUT CONTRAST  Technique:  Multidetector CT imaging of the head and cervical spine was performed following the standard protocol without intravenous contrast.  Multiplanar CT image reconstructions  of the cervical spine were also generated.  Comparison:  Head CT 10/29/2012 and CT  03/25/2007  CT HEAD  Findings: There is mild cerebral atrophy.  Diffuse low density in the periventricular and subcortical white matter appears chronic. No evidence for acute hemorrhage, mass lesion, midline shift, hydrocephalus or large infarct.  No acute bony abnormality.  IMPRESSION: No acute intracranial  abnormality.  Atrophy and evidence for chronic small vessel ischemic changes.  CT CERVICAL SPINE  Findings: The right mandibular condyle appears to be anterior subluxed or dislocated.  There is multilevel cervical spondylosis without acute bony abnormality.  No evidence for an apical pneumothorax.  There is no significant soft tissue swelling in the neck.  There is marked disc space narrowing at C5-C6.  Mild anterolisthesis at C5-C6.  The alignment of the cervical spine is unchanged.  IMPRESSION: Cervical spondylosis, particularly at C5-C6.  No acute bony abnormality.  The right mandibular condyle is anteriorly subluxed.   Original Report Authenticated By: Richarda Overlie, M.D.    Ct Cervical Spine Wo Contrast  02/06/2013  *RADIOLOGY REPORT*  Clinical Data:  Fall with neck pain.  CT HEAD WITHOUT CONTRAST CT CERVICAL SPINE WITHOUT CONTRAST  Technique:  Multidetector CT imaging of the head and cervical spine was performed following the standard protocol without intravenous contrast.  Multiplanar CT image reconstructions of the cervical spine were also generated.  Comparison:  Head CT 10/29/2012 and CT  03/25/2007  CT HEAD  Findings: There is mild cerebral atrophy.  Diffuse low density in the periventricular and subcortical white matter appears chronic. No evidence for acute hemorrhage, mass lesion, midline shift, hydrocephalus or large infarct.  No acute bony abnormality.  IMPRESSION: No acute intracranial abnormality.  Atrophy and evidence for chronic small vessel ischemic changes.  CT CERVICAL SPINE  Findings: The right mandibular condyle appears to be anterior subluxed or dislocated.  There is multilevel cervical spondylosis without acute bony abnormality.  No evidence for an apical pneumothorax.  There is no significant soft tissue swelling in the neck.  There is marked disc space narrowing at C5-C6.  Mild anterolisthesis at C5-C6.  The alignment of the cervical spine is unchanged.  IMPRESSION: Cervical spondylosis,  particularly at C5-C6.  No acute bony abnormality.  The right mandibular condyle is anteriorly subluxed.   Original Report Authenticated By: Richarda Overlie, M.D.    Ct Pelvis Wo Contrast  02/06/2013  *RADIOLOGY REPORT*  Clinical Data: Pelvic pain.  Status post fall.  Left hip fracture.  CT PELVIS WITHOUT CONTRAST  Technique:  Multidetector CT imaging of the pelvis was performed following the standard protocol without intravenous contrast.  Comparison: Left hip radiographs same date.  Findings: The bones are diffusely demineralized.  As demonstrated on the earlier radiographs, there is a nondisplaced fracture involving the left femoral greater trochanter.  No definite intertrochanteric extension of the fracture is identified, although the component involving the greater trochanter is quite subtle in itself. There is a probable bone island within the right iliac crest.  There is no evidence of pelvic fracture or dislocation.  Mild degenerative changes are present the lower lumbar spine facet joints.  The sacroiliac joints are intact.  There is mild asymmetric subcutaneous edema in the left buttock. No large hematoma is identified.  There are diffuse vascular calcifications within the pelvis.  IMPRESSION:  1.  Nondisplaced fracture of the left femoral greater trochanter. This fracture does not demonstrate any definite intertrochanteric extension.  However, if the patient has inability to bear weight, follow-up  imaging may be warranted. 2.  No large hematoma identified.   Original Report Authenticated By: Carey Bullocks, M.D.     Review of Systems  Musculoskeletal: Positive for falls (She fell last Tuesday, six days ago and hurt left hip.  She fell again today.  She has significant pain.  She has no walker.  She has no other injury.).  Endo/Heme/Allergies:       History of diabetes, oral controlled.  History of anemia.  All other systems reviewed and are negative.   Blood pressure 154/99, pulse 91, temperature  97.5 F (36.4 C), temperature source Oral, resp. rate 18, height 4\' 11"  (1.499 m), weight 43.7 kg (96 lb 5.5 oz), SpO2 99.00%. Physical Exam  Constitutional: She is oriented to person, place, and time. She appears well-developed and well-nourished.  HENT:  Head: Normocephalic and atraumatic.  Eyes: Conjunctivae and EOM are normal. Pupils are equal, round, and reactive to light.  Neck: Normal range of motion. Neck supple.  Cardiovascular: Normal rate, regular rhythm and intact distal pulses.   Respiratory: Effort normal and breath sounds normal.  GI: Soft. Bowel sounds are normal.  Musculoskeletal: She exhibits tenderness (Pain left hip with motion.  Leg lengths are equal with similar rotation.  NV is intact.).       Left hip: She exhibits decreased range of motion and tenderness.       Legs: Neurological: She is alert and oriented to person, place, and time. She has normal reflexes.  Skin: Skin is warm and dry.  Psychiatric: She has a normal mood and affect. Her behavior is normal. Judgment and thought content normal.    Assessment/Plan: Greater tuberosity fracture of the left hip.  It has not extended with two falls and hopefully is stable.  She will need PT and walker and toe touch on the left.  It will take about two months for this to heal.  It could possibly extend if she falls again and would need surgery then.  I have ordered PT.  Bea Duren 02/06/2013, 7:15 PM

## 2013-02-06 NOTE — ED Notes (Signed)
Patient arrives via EMS from home with c/o fall four days ago and was not evaluated at that time. Patient c/o increasing left pelvic pain. She is unable to bear weight at this time.

## 2013-02-06 NOTE — ED Notes (Signed)
Patient currently eating dinner.

## 2013-02-06 NOTE — H&P (Signed)
History and Physical  Kara Santos:[id number] DOB: 09/20/1917 DOA: 02/06/2013  Referring physician: Dr. Manus Gunning PCP: [name], MD   Chief Complaint: Hip pain  HPI:   79 year old woman presents the emergency department with left hip pain status post fall. Imaging revealed greater trochanter fracture. Per orthopedics patient may weight-bear as tolerated, however patient was unable to walk therefore referred for admission.  History obtained from patient at bedside. She remains active, still drives and lives by herself. She reports several falls recently. Approximately 4 days ago she got out of her car and suddenly found herself on the driveway. She is not know what happened but does not remember slipping or passing out. She did have hip pain on the left when she woke up was able to get up and go back into the house. She has been able to function at home over the intervening time taking occasional Tylenol for continued left hip pain. Today she got up had breakfast, made a pie. She sat down the line she when she tried to get back to when she was unable. Her daughter came over to help her but was unable to assist her to walk. Patient said no further trauma since her fall 4 days ago.  In the emergency department she was noted to be afebrile with stable vital signs. Screening laboratory studies were grossly unremarkable for acute issues. CT of the head, cervical spine, chest x-ray were unremarkable. Left hip film revealed greater trochanter fracture. CT of the pelvis confirmed nondisplaced fracture of the left femoral greater trochanter.  Review of Systems:  Negative for fever, visual changes, sore throat, rash, chest pain, SOB, dysuria, bleeding, n/v/abdominal pain.  Past Medical History  Diagnosis Date  . Anemia 11/07/2011    chronic IDA requiring multiple transfusions  . Diabetes mellitus   . Hypothyroidism     s/p thyroid surgery for thyroid cancer  . GERD (gastroesophageal reflux  disease)   . Iron deficiency anemia 05/05/2012  . Depression     Past Surgical History  Procedure Laterality Date  . Esophagogastroduodenoscopy  08/2006    Dr. Ronni Rumble [facility], large duodenal diverticulum  . Colonoscopy  08/2006    Dr. Lolita Patella sided diverticulosis  . Givens small bowel capsule  08/2006    few gastric erosions, numerous small bowel AVMs with SB erosions  . Double balloon enteroscopy  06/1008    [facility]-->APC of 12 SB AVMs in jejunum. 6 feet of jejunum inspected  . Right shoulder surgery    . Cholecystectomy    . Appendectomy    . Bilateral cataract extractions    . Breast reduction surgery    . Thyroid surgery      thyroid cancer    Social History:  reports that she has never smoked. She has never used smokeless tobacco. She reports that she does not drink alcohol or use illicit drugs.  No Known Allergies  Family History  Problem Relation Age of Onset  . Cancer Mother   . Cancer Father   . Diabetes Father   . Diabetes Sister   . Cancer Brother      Prior to Admission medications   Medication Sig Start Date End Date Taking? Authorizing Provider  ACIPHEX 20 MG tablet Take 20 mg by mouth Daily.  12/23/11  Yes Historical Provider, MD  escitalopram (LEXAPRO) 10 MG tablet Take 1 tablet (10 mg total) by mouth daily. 08/23/12  Yes Maurine Minister Kefalas, PA-C  levothyroxine (SYNTHROID, LEVOTHROID) 100 MCG tablet Take 100 mcg  by mouth daily.   Yes Historical Provider, MD  metFORMIN (GLUMETZA) 500 MG (MOD) 24 hr tablet Take 1,000 mg by mouth 2 (two) times daily.   Yes Historical Provider, MD  Multiple Vitamin (MULITIVITAMIN WITH MINERALS) TABS Take 1 tablet by mouth daily.   Yes Historical Provider, MD   Physical Exam: Filed Vitals:   02/06/13 1321 02/06/13 1705  BP: 156/74 161/74  Pulse: 91 82  Temp: 97 F (36.1 C)   TempSrc: Oral   Resp:  18  Weight: 40.824 kg (90 lb)   SpO2: 97% 97%    General: Examined in emergency department. Appears calm and  comfortable Eyes: PERRL, normal lids, irises ENT: Mildly hard of hearing. Lips appear unremarkable. Neck: no LAD, masses or thyromegaly Cardiovascular: RRR, no m/r/g. No LE edema. Respiratory: CTA bilaterally, no w/r/r. Normal respiratory effort. Abdomen: soft, ntnd Skin: Small amount of bruising over left greater trochanter Musculoskeletal: grossly normal tone BUE/BLE, left foot warm and dry, sensation grossly intact. Psychiatric: grossly normal mood and affect, speech fluent and appropriate Neurologic: grossly non-focal.  Wt Readings from Last 3 Encounters:  02/06/13 40.824 kg (90 lb)  10/27/12 42.593 kg (93 lb 14.4 oz)  08/02/12 42.502 kg (93 lb 11.2 oz)    Labs on Admission:  Basic Metabolic Panel:  Recent Labs Lab 02/06/13 1328  NA 141  K 3.3*  CL 101  CO2 23  GLUCOSE 127*  BUN 15  CREATININE 0.64  CALCIUM 7.4*    Liver Function Tests:  Recent Labs Lab 02/06/13 1328  AST 33  ALT 17  ALKPHOS 71  BILITOT 0.4  PROT 6.0  ALBUMIN 3.5    CBC:  Recent Labs Lab 02/06/13 1328  WBC 7.0  NEUTROABS 5.3  HGB 9.7*  HCT 26.9*  MCV 94.4  PLT 164    Cardiac Enzymes:  Recent Labs Lab 02/06/13 1328  TROPONINI <0.30    Radiological Exams on Admission: Dg Chest 1 View  02/06/2013  *RADIOLOGY REPORT*  Clinical Data: Fall  CHEST - 1 VIEW  Comparison: 11/21/2009  Findings: Cardiomediastinal silhouette is stable.  No acute infiltrate or pleural effusion.  No pulmonary edema.  Bony thorax is unremarkable.  IMPRESSION: No active disease.  No significant change.   Original Report Authenticated By: Natasha Mead, M.D.    Dg Hip Complete Left  02/06/2013  *RADIOLOGY REPORT*  Clinical Data: Pelvic pain  LEFT HIP - COMPLETE 2+ VIEW  Comparison: None.  Findings: Nondisplaced fracture is seen involving the greater trochanter of the proximal left femur.  No dislocation is noted. The proximal left femoral head and neck appear normal.  Narrowing of both hip joints is noted  consistent with degenerative joint disease.  IMPRESSION: Nondisplaced fracture involving greater trochanter of the proximal left femur.   Original Report Authenticated By: Lupita Raider.,  M.D.    Ct Head Wo Contrast  02/06/2013  *RADIOLOGY REPORT*  Clinical Data:  Fall with neck pain.  CT HEAD WITHOUT CONTRAST CT CERVICAL SPINE WITHOUT CONTRAST  Technique:  Multidetector CT imaging of the head and cervical spine was performed following the standard protocol without intravenous contrast.  Multiplanar CT image reconstructions of the cervical spine were also generated.  Comparison:  Head CT 10/29/2012 and CT  03/25/2007  CT HEAD  Findings: There is mild cerebral atrophy.  Diffuse low density in the periventricular and subcortical white matter appears chronic. No evidence for acute hemorrhage, mass lesion, midline shift, hydrocephalus or large infarct.  No acute bony abnormality.  IMPRESSION: No acute intracranial abnormality.  Atrophy and evidence for chronic small vessel ischemic changes.  CT CERVICAL SPINE  Findings: The right mandibular condyle appears to be anterior subluxed or dislocated.  There is multilevel cervical spondylosis without acute bony abnormality.  No evidence for an apical pneumothorax.  There is no significant soft tissue swelling in the neck.  There is marked disc space narrowing at C5-C6.  Mild anterolisthesis at C5-C6.  The alignment of the cervical spine is unchanged.  IMPRESSION: Cervical spondylosis, particularly at C5-C6.  No acute bony abnormality.  The right mandibular condyle is anteriorly subluxed.   Original Report Authenticated By: Richarda Overlie, M.D.    Ct Cervical Spine Wo Contrast  02/06/2013  *RADIOLOGY REPORT*  Clinical Data:  Fall with neck pain.  CT HEAD WITHOUT CONTRAST CT CERVICAL SPINE WITHOUT CONTRAST  Technique:  Multidetector CT imaging of the head and cervical spine was performed following the standard protocol without intravenous contrast.  Multiplanar CT image  reconstructions of the cervical spine were also generated.  Comparison:  Head CT 10/29/2012 and CT  03/25/2007  CT HEAD  Findings: There is mild cerebral atrophy.  Diffuse low density in the periventricular and subcortical white matter appears chronic. No evidence for acute hemorrhage, mass lesion, midline shift, hydrocephalus or large infarct.  No acute bony abnormality.  IMPRESSION: No acute intracranial abnormality.  Atrophy and evidence for chronic small vessel ischemic changes.  CT CERVICAL SPINE  Findings: The right mandibular condyle appears to be anterior subluxed or dislocated.  There is multilevel cervical spondylosis without acute bony abnormality.  No evidence for an apical pneumothorax.  There is no significant soft tissue swelling in the neck.  There is marked disc space narrowing at C5-C6.  Mild anterolisthesis at C5-C6.  The alignment of the cervical spine is unchanged.  IMPRESSION: Cervical spondylosis, particularly at C5-C6.  No acute bony abnormality.  The right mandibular condyle is anteriorly subluxed.   Original Report Authenticated By: Richarda Overlie, M.D.    Ct Pelvis Wo Contrast  02/06/2013  *RADIOLOGY REPORT*  Clinical Data: Pelvic pain.  Status post fall.  Left hip fracture.  CT PELVIS WITHOUT CONTRAST  Technique:  Multidetector CT imaging of the pelvis was performed following the standard protocol without intravenous contrast.  Comparison: Left hip radiographs same date.  Findings: The bones are diffusely demineralized.  As demonstrated on the earlier radiographs, there is a nondisplaced fracture involving the left femoral greater trochanter.  No definite intertrochanteric extension of the fracture is identified, although the component involving the greater trochanter is quite subtle in itself. There is a probable bone island within the right iliac crest.  There is no evidence of pelvic fracture or dislocation.  Mild degenerative changes are present the lower lumbar spine facet joints.  The  sacroiliac joints are intact.  There is mild asymmetric subcutaneous edema in the left buttock. No large hematoma is identified.  There are diffuse vascular calcifications within the pelvis.  IMPRESSION:  1.  Nondisplaced fracture of the left femoral greater trochanter. This fracture does not demonstrate any definite intertrochanteric extension.  However, if the patient has inability to bear weight, follow-up imaging may be warranted. 2.  No large hematoma identified.   Original Report Authenticated By: Carey Bullocks, M.D.     EKG: Independently reviewed. Sinus rhythm. No acute changes.   Principal Problem:   Closed fracture of greater trochanter of femur Active Problems:   Anemia   Diabetes mellitus type 2 in nonobese  History of fall   Hypokalemia   Assessment/Plan 79 year old woman with left hip pain status post fall 4 days ago. Imaging confirms nondisplaced fracture of the left femoral greater trochanter without definite extension. Because of inability to walk and pain she was admitted for observation.  1. Left greater trochanter fracture without associated pelvic fracture or femoral neck fracture: Status post fall 4 days prior to admission. Observation for pain control, physical therapy evaluation as the patient lives alone. May weight-bear as tolerated per orthopedics. 2. Recurrent falls: physical therapy consultation. 3. Hypokalemia: Replete. 4. Chronic normocytic anemia: Stable. History of small bowel AVM. 5. Diabetes mellitus type 2: Hold metformin while in inpatient. Sliding scale insulin.  Code Status: Full code Family Communication: None present Disposition Plan/Anticipated LOS: Observation. Less than 24 hours.  Time spent: 50 minutes  Brendia Sacks, MD  [facility] Pager [phone] 02/06/2013, 5:34 PM

## 2013-02-06 NOTE — ED Provider Notes (Signed)
History  This chart was scribed for Kara Octave, MD by Shari Heritage, ED Scribe. The patient was seen in room APA10/APA10. Patient's care was started at 1315.   CSN: [id number]  Arrival date & time 02/06/13  1315      Chief Complaint  Patient presents with  . Pelvic Pain     The history is provided by the patient. No language interpreter was used.     HPI Comments: Kara Santos is a 79 y.o. female with history of anemia, diabetes, GERD, hypothyroidism, iron deficiency anemia who presents to the Emergency Department complaining of a fall that occurred 4 days ago. She states that she tripped and fell on the gravel in her driveway. She denies having any dizziness or lightheadedness at that time. She says that she was able to get up by herself and was not entrapped after the fall. Patient was not evaluated at that time. She states that she has had constant, worsening left pelvic and hip pain since the fall that has progressively worsened today. She says that she was trying to walk from her kitchen to her living room today when she suddenly was unable to bear weight on her left side prompting her to call EMS. She also states that when she fell on Wednesday, she hit her head. She states that she uses a cane to ambulate sometimes. Patient lives alone.    PCP - Juanetta Gosling  Past Medical History  Diagnosis Date  . Anemia 11/07/2011    chronic IDA requiring multiple transfusions  . Diabetes mellitus   . Hypothyroidism     s/p thyroid surgery for thyroid cancer  . GERD (gastroesophageal reflux disease)   . Iron deficiency anemia 05/05/2012  . Depression     Past Surgical History  Procedure Laterality Date  . Esophagogastroduodenoscopy  08/2006    Dr. Ronni Rumble [facility], large duodenal diverticulum  . Colonoscopy  08/2006    Dr. Lolita Patella sided diverticulosis  . Givens small bowel capsule  08/2006    few gastric erosions, numerous small bowel AVMs with SB erosions  . Double balloon  enteroscopy  06/1008    [facility]-->APC of 12 SB AVMs in jejunum. 6 feet of jejunum inspected  . Right shoulder surgery    . Cholecystectomy    . Appendectomy    . Bilateral cataract extractions    . Breast reduction surgery    . Thyroid surgery      thyroid cancer    Family History  Problem Relation Age of Onset  . Cancer Mother   . Cancer Father   . Diabetes Father   . Diabetes Sister   . Cancer Brother     History  Substance Use Topics  . Smoking status: Never Smoker   . Smokeless tobacco: Never Used  . Alcohol Use: No    OB History   Grav Para Term Preterm Abortions TAB SAB Ect Mult Living                  Review of Systems A complete 10 system review of systems was obtained and all systems are negative except as noted in the HPI and PMH.   Allergies  Review of patient's allergies indicates no known allergies.  Home Medications   Current Outpatient Rx  Name  Route  Sig  Dispense  Refill  . Acetaminophen (TYLENOL PO)   Oral   Take 1 tablet by mouth as needed.         . ACIPHEX 20 MG  tablet   Oral   Take 20 mg by mouth Daily.          . clorazepate (TRANXENE) 7.5 MG tablet   Oral   Take 7.5 mg by mouth 3 (three) times daily as needed. Nerves         . cyanocobalamin 1000 MCG tablet   Oral   Take 100 mcg by mouth daily.         [name] Kitchen escitalopram (LEXAPRO) 10 MG tablet   Oral   Take 1 tablet (10 mg total) by mouth daily.   30 tablet   3   . levothyroxine (SYNTHROID, LEVOTHROID) 100 MCG tablet   Oral   Take 100 mcg by mouth daily.         . metFORMIN (GLUMETZA) 500 MG (MOD) 24 hr tablet   Oral   Take 1,000 mg by mouth 2 (two) times daily.         . Multiple Vitamin (MULITIVITAMIN WITH MINERALS) TABS   Oral   Take 1 tablet by mouth daily.         [name] Kitchen PROPOXYPHENE HCL PO   Oral   Take by mouth 4 (four) times daily as needed.            Triage Vitals: BP 156/74  Pulse 91  Temp(Src) 97 F (36.1 C) (Oral)  Wt 90 lb (40.824 kg)   BMI 18.17 kg/m2  SpO2 97%  Physical Exam  Constitutional: She is oriented to person, place, and time. She appears well-developed and well-nourished.  HENT:  Head: Normocephalic and atraumatic.  Mouth/Throat: Oropharynx is clear and moist.  Eyes: Conjunctivae and EOM are normal. Pupils are equal, round, and reactive to light.  Neck: Normal range of motion. Neck supple.  Cardiovascular: Normal rate, regular rhythm and normal heart sounds.   No murmur heard. Pulmonary/Chest: Effort normal and breath sounds normal. No respiratory distress. She has no wheezes. She has no rales.  Abdominal: Soft. There is no tenderness.  Musculoskeletal: She exhibits tenderness.  Tender over left lateral hip. Leg is shortened and internally rotated. +2 DP and PT pulses on the right. 2+ PT on the left 1+ DP on the left. No cervical spine pain. TL spine are nontender.  Neurological: She is alert and oriented to person, place, and time.  Skin: Skin is warm and dry. No rash noted.    ED Course  Procedures (including critical care time) DIAGNOSTIC STUDIES: Oxygen Saturation is 97% on room air, adequate by my interpretation.    COORDINATION OF CARE: 1:23 PM- Patient informed of current plan for treatment and evaluation and agrees with plan at this time.   2:56 PM- Hip x-ray shows a greater trochanter fracture of the proximal left femur. CXR and CTs of head and neck are unremarkable. Will page ortho for a consult.   4:20 PM- Patient discussed with Dr. Hilda Lias who is on call for orthopedics. States that patient can weight bear as tolerated.     Labs Reviewed  CBC WITH DIFFERENTIAL - Abnormal; Notable for the following:    RBC 2.85 (*)    Hemoglobin 9.7 (*)    HCT 26.9 (*)    MCHC 36.1 (*)    RDW 16.1 (*)    All other components within normal limits  COMPREHENSIVE METABOLIC PANEL - Abnormal; Notable for the following:    Potassium 3.3 (*)    Glucose, Bld 127 (*)    Calcium 7.4 (*)    GFR calc non Af  Denyse Dago  74 (*)    GFR calc Af Amer 86 (*)    All other components within normal limits  TROPONIN I  PROTIME-INR    Dg Chest 1 View  02/06/2013  *RADIOLOGY REPORT*  Clinical Data: Fall  CHEST - 1 VIEW  Comparison: 11/21/2009  Findings: Cardiomediastinal silhouette is stable.  No acute infiltrate or pleural effusion.  No pulmonary edema.  Bony thorax is unremarkable.  IMPRESSION: No active disease.  No significant change.   Original Report Authenticated By: Natasha Mead, M.D.    Dg Hip Complete Left  02/06/2013  *RADIOLOGY REPORT*  Clinical Data: Pelvic pain  LEFT HIP - COMPLETE 2+ VIEW  Comparison: None.  Findings: Nondisplaced fracture is seen involving the greater trochanter of the proximal left femur.  No dislocation is noted. The proximal left femoral head and neck appear normal.  Narrowing of both hip joints is noted consistent with degenerative joint disease.  IMPRESSION: Nondisplaced fracture involving greater trochanter of the proximal left femur.   Original Report Authenticated By: Lupita Raider.,  M.D.    Ct Head Wo Contrast  02/06/2013  *RADIOLOGY REPORT*  Clinical Data:  Fall with neck pain.  CT HEAD WITHOUT CONTRAST CT CERVICAL SPINE WITHOUT CONTRAST  Technique:  Multidetector CT imaging of the head and cervical spine was performed following the standard protocol without intravenous contrast.  Multiplanar CT image reconstructions of the cervical spine were also generated.  Comparison:  Head CT 10/29/2012 and CT  03/25/2007  CT HEAD  Findings: There is mild cerebral atrophy.  Diffuse low density in the periventricular and subcortical white matter appears chronic. No evidence for acute hemorrhage, mass lesion, midline shift, hydrocephalus or large infarct.  No acute bony abnormality.  IMPRESSION: No acute intracranial abnormality.  Atrophy and evidence for chronic small vessel ischemic changes.  CT CERVICAL SPINE  Findings: The right mandibular condyle appears to be anterior subluxed or dislocated.   There is multilevel cervical spondylosis without acute bony abnormality.  No evidence for an apical pneumothorax.  There is no significant soft tissue swelling in the neck.  There is marked disc space narrowing at C5-C6.  Mild anterolisthesis at C5-C6.  The alignment of the cervical spine is unchanged.  IMPRESSION: Cervical spondylosis, particularly at C5-C6.  No acute bony abnormality.  The right mandibular condyle is anteriorly subluxed.   Original Report Authenticated By: Richarda Overlie, M.D.    Ct Cervical Spine Wo Contrast  02/06/2013  *RADIOLOGY REPORT*  Clinical Data:  Fall with neck pain.  CT HEAD WITHOUT CONTRAST CT CERVICAL SPINE WITHOUT CONTRAST  Technique:  Multidetector CT imaging of the head and cervical spine was performed following the standard protocol without intravenous contrast.  Multiplanar CT image reconstructions of the cervical spine were also generated.  Comparison:  Head CT 10/29/2012 and CT  03/25/2007  CT HEAD  Findings: There is mild cerebral atrophy.  Diffuse low density in the periventricular and subcortical white matter appears chronic. No evidence for acute hemorrhage, mass lesion, midline shift, hydrocephalus or large infarct.  No acute bony abnormality.  IMPRESSION: No acute intracranial abnormality.  Atrophy and evidence for chronic small vessel ischemic changes.  CT CERVICAL SPINE  Findings: The right mandibular condyle appears to be anterior subluxed or dislocated.  There is multilevel cervical spondylosis without acute bony abnormality.  No evidence for an apical pneumothorax.  There is no significant soft tissue swelling in the neck.  There is marked disc space narrowing at C5-C6.  Mild anterolisthesis at C5-C6.  The alignment of the cervical spine is unchanged.  IMPRESSION: Cervical spondylosis, particularly at C5-C6.  No acute bony abnormality.  The right mandibular condyle is anteriorly subluxed.   Original Report Authenticated By: Richarda Overlie, M.D.    Ct Pelvis Wo  Contrast  02/06/2013  *RADIOLOGY REPORT*  Clinical Data: Pelvic pain.  Status post fall.  Left hip fracture.  CT PELVIS WITHOUT CONTRAST  Technique:  Multidetector CT imaging of the pelvis was performed following the standard protocol without intravenous contrast.  Comparison: Left hip radiographs same date.  Findings: The bones are diffusely demineralized.  As demonstrated on the earlier radiographs, there is a nondisplaced fracture involving the left femoral greater trochanter.  No definite intertrochanteric extension of the fracture is identified, although the component involving the greater trochanter is quite subtle in itself. There is a probable bone island within the right iliac crest.  There is no evidence of pelvic fracture or dislocation.  Mild degenerative changes are present the lower lumbar spine facet joints.  The sacroiliac joints are intact.  There is mild asymmetric subcutaneous edema in the left buttock. No large hematoma is identified.  There are diffuse vascular calcifications within the pelvis.  IMPRESSION:  1.  Nondisplaced fracture of the left femoral greater trochanter. This fracture does not demonstrate any definite intertrochanteric extension.  However, if the patient has inability to bear weight, follow-up imaging may be warranted. 2.  No large hematoma identified.   Original Report Authenticated By: Carey Bullocks, M.D.      No diagnosis found.    MDM  Left hip and pelvic pain after fall 4 days ago gradually worsening. Unable to bear weight today. Patient uncertain why or how she fell. Denies dizziness, lightheadedness, chest pain or shortness of breath.  Hemoglobin within previous range. Patient known to have chronic AVMs. Imaging remarkable for greater trochanter fracture on the left..  CT confirms left greater trochanter fracture without associated pelvic fracture of femoral neck fracture. Discussed with Dr. Hilda Lias who agrees patient can be weightbearing as  tolerated.  We'll attempt weightbearing in the ED. Patient is unable to bear weight and walk in the ED. She has no pain at rest. She has  pain with attempted ambulation. Will admit for pain control and physical therapy.   Date: 02/06/2013  Rate: 87  Rhythm: normal sinus rhythm  QRS Axis: normal  Intervals: normal  ST/T Wave abnormalities: normal  Conduction Disutrbances:none  Narrative Interpretation:   Old EKG Reviewed: unchanged     I personally performed the services described in this documentation, which was scribed in my presence. The recorded information has been reviewed and is accurate.    Kara Octave, MD 02/06/13 [phone]

## 2013-02-07 ENCOUNTER — Other Ambulatory Visit (HOSPITAL_COMMUNITY): Payer: Self-pay | Admitting: Oncology

## 2013-02-07 ENCOUNTER — Encounter (HOSPITAL_COMMUNITY): Payer: Self-pay | Admitting: *Deleted

## 2013-02-07 DIAGNOSIS — F329 Major depressive disorder, single episode, unspecified: Secondary | ICD-10-CM

## 2013-02-07 DIAGNOSIS — D62 Acute posthemorrhagic anemia: Secondary | ICD-10-CM

## 2013-02-07 DIAGNOSIS — F32A Depression, unspecified: Secondary | ICD-10-CM

## 2013-02-07 DIAGNOSIS — D649 Anemia, unspecified: Secondary | ICD-10-CM | POA: Diagnosis not present

## 2013-02-07 DIAGNOSIS — S72009A Fracture of unspecified part of neck of unspecified femur, initial encounter for closed fracture: Secondary | ICD-10-CM | POA: Diagnosis not present

## 2013-02-07 DIAGNOSIS — I10 Essential (primary) hypertension: Secondary | ICD-10-CM | POA: Diagnosis not present

## 2013-02-07 LAB — GLUCOSE, CAPILLARY
Glucose-Capillary: 91 mg/dL (ref 70–99)
Glucose-Capillary: 163 mg/dL — ABNORMAL HIGH (ref 70–99)
Glucose-Capillary: 104 mg/dL — ABNORMAL HIGH (ref 70–99)

## 2013-02-07 MED ORDER — ESCITALOPRAM OXALATE 10 MG PO TABS: 10.0000 mg | ORAL_TABLET | Freq: Every day | ORAL | Status: DC

## 2013-02-07 MED ORDER — ENOXAPARIN SODIUM 30 MG/0.3ML ~~LOC~~ SOLN
30.0000 mg | SUBCUTANEOUS | Status: DC
  Administered 2013-02-07: 30 mg via SUBCUTANEOUS
  Filled 2013-02-07: qty 0.3

## 2013-02-07 NOTE — Progress Notes (Signed)
Contacted by RN CM to assess pt for a possible inpt rehab admission. Patient is a good candidate for IP rehab, But bed availability is very limited at this time. I would recommend another inpt rehab facility at this time. I left a message for RN CM. Please call me for any questions. [phone].

## 2013-02-07 NOTE — Progress Notes (Signed)
UR Chart Review Completed  

## 2013-02-07 NOTE — Progress Notes (Signed)
Physical Therapy Treatment Patient Details Name: Kara Santos MRN: [id number] DOB: 1917-03-29 Today's Date: 02/07/2013 Time: 4098-1191 PT Time Calculation (min): 40 min  PT Assessment / Plan / Recommendation Comments on Treatment Session  Pt's mobility/pain are improved this afternoon.  She was able to tolerate all ther ex with no c/o pain.  She continues to have difficulty maintaining TTWB on LLE with walker and is only able to ambulate short distance before tiring.    Follow Up Recommendations        Does the patient have the potential to tolerate intense rehabilitation     Barriers to Discharge        Equipment Recommendations       Recommendations for Other Services    Frequency     Plan Discharge plan remains appropriate    Precautions / Restrictions     Pertinent Vitals/Pain     Mobility  Bed Mobility Bed Mobility: Supine to Sit Supine to Sit: 4: Min guard;HOB flat Sit to Sidelying Left: 4: Min guard;HOB flat Details for Bed Mobility Assistance: less pain with transfer Transfers Transfers: Stand Pivot Transfers Sit to Stand: 4: Min guard;With upper extremity assist;From bed;From chair/3-in-1 Stand to Sit: 5: Supervision;With upper extremity assist;To chair/3-in-1;To bed Stand Pivot Transfers: 4: Min guard Ambulation/Gait Ambulation/Gait Assistance: 4: Min guard Ambulation Distance (Feet): 15 Feet Assistive device: Rolling walker General Gait Details: continues with difficulty maintaining TTWB L Stairs: No Wheelchair Mobility Wheelchair Mobility: No    Exercises General Exercises - Lower Extremity Ankle Circles/Pumps: AROM;Both;10 reps;Supine Quad Sets: AROM;Both;10 reps;Supine Gluteal Sets: AROM;Both;10 reps;Supine Short Arc Quad: AROM;Both;10 reps;Supine Heel Slides: AAROM;Left;10 reps;Supine Hip ABduction/ADduction: AAROM;Left;10 reps;Supine Straight Leg Raises: AROM;Right;10 reps;Supine   PT Diagnosis:    PT Problem List:   PT Treatment  Interventions:     PT Goals Acute Rehab PT Goals Pt will go Supine/Side to Sit: with modified independence PT Goal: Supine/Side to Sit - Progress: Updated due to goal met Pt will go Sit to Supine/Side: with modified independence PT Goal: Sit to Supine/Side - Progress: Updated due to goal met PT Goal: Sit to Stand - Progress: Progressing toward goal PT Goal: Stand to Sit - Progress: Progressing toward goal Pt will Transfer Bed to Chair/Chair to Bed: with modified independence PT Transfer Goal: Bed to Chair/Chair to Bed - Progress: Updated due to goal met PT Goal: Ambulate - Progress: Progressing toward goal PT Goal: Up/Down Stairs - Progress: Not progressing  Visit Information  Last PT Received On: 02/07/13    Subjective Data  Subjective: is ready to try to get up again   Cognition       Balance     End of Session PT - End of Session Equipment Utilized During Treatment: Gait belt Activity Tolerance: Patient tolerated treatment well Patient left: in chair;with call bell/phone within reach;with family/visitor present Nurse Communication: Mobility status   GP     Kara Santos 02/07/2013, 4:39 PM

## 2013-02-07 NOTE — Care Management Note (Signed)
    Page 1 of 2   02/08/2013     4:33:46 PM   CARE MANAGEMENT NOTE 02/08/2013  Patient:  Kara Santos, Kara Santos   Account Number:  [id number]  Date Initiated:  02/07/2013  Documentation initiated by:  Anibal Henderson  Subjective/Objective Assessment:   Admitted with Fx hip, non-surgical candidate. pt lives at home, alone, is independent and still drives. Daughter lives next door     Action/Plan:   pt is OBV and does not qualify for SNF, but both OT and PT are recommending CIR. Spoke with Kara Santos at [facility], and pt is appropriate for CIR, but they do not have a bed. Spoke with pt and daughter again and they would like to try other   Anticipated DC Date:  02/08/2013   Anticipated DC Plan:  IP REHAB FACILITY  In-house referral  Clinical Social Worker      DC Planning Services  CM consult      PAC Choice  IP REHAB   Choice offered to / List presented to:  C-1 Patient           Status of service:  Completed, signed off Medicare Important Message given?  NA - LOS <3 / Initial given by admissions (If response is "NO", the following Medicare IM given date fields will be blank) Date Medicare IM given:   Date Additional Medicare IM given:    Discharge Disposition:  ACUTE TO ACUTE TRANS  Per UR Regulation:  Reviewed for med. necessity/level of care/duration of stay  If discussed at Long Length of Stay Meetings, dates discussed:    Comments:  02/08/13 0815 Anibal Henderson RN/CM VM from [facility] and they will accept Kara Santos , and can take her today. Spoke with the pt, daughter, and Dr Kara Santos, and all in agreement that this is the best D/C plan,  and pt will be D/C to [facility] today 02/07/13 1600- Kara Kirkendoll RN/CM [place] back from [facility] and they will look at clinicals on the pt- these faxed to [facility] and they will review and call in the am 02/07/13 1500 Anibal Henderson RN/CM-- hospitals, and want to try [facility] first, then [facility]- message left for [facility]

## 2013-02-07 NOTE — Clinical Social Work Psychosocial (Signed)
Clinical Social Work Department BRIEF PSYCHOSOCIAL ASSESSMENT 02/07/2013  Patient:  Kara Santos, Kara Santos     Account Number:  [id number]     Admit date:  02/06/2013  Clinical Social Worker:  Nancie Neas  Date/Time:  02/07/2013 02:45 PM  Referred by:  Physician  Date Referred:  02/07/2013 Referred for  SNF Placement   Other Referral:   Interview type:  Patient Other interview type:   and daughter- Kara Santos    PSYCHOSOCIAL DATA Living Status:  ALONE Admitted from facility:   Level of care:   Primary support name:  Kara Santos Primary support relationship to patient:  CHILD, ADULT Degree of support available:   adequate    CURRENT CONCERNS Current Concerns  Post-Acute Placement   Other Concerns:    SOCIAL WORK ASSESSMENT / PLAN CSW met with pt and pt's daughter at bedside. Pt alert and oriented and reports she lives alone. Her daughter, Kara Santos lives next door and is pt's only child. At baseline, pt is independent with ADLs and is still driving. Pt states, "I do anything I want." She has a cleaning lady, but otherwise takes care of everything at home. Pt has a lifeline, but her daughter said she doesn't want to use it. About a week ago, pt had gotten out of her car and was headed inside when she fell. She was able to get up and went inside and thought she was okay. She was able to get around fine, but hip was sore. Yesterday, pt tried to get out of her chair after lunch but could not move. Her daughter called EMS and pt found to have hip fracture. Surgery not recommended at this point. Pt evaluated by PT today and recommendation is for CIR. CM working on.  Pt met inpatient today, but per MD will not require 3 day stay. Pt and daughter notified that pt could pay privately for SNF if CIR does not have beds. Pt states if she can't go to CIR and must pay privately, she would rather use that money to hire private duty care at home and have home health. CSW notified CM and will sign off as back up plan is  identified.   Assessment/plan status:  Referral to [facility] Other assessment/ plan:   Information/referral to community resources:   CM for CIR/home health/private duty care list.    PATIENT'S/FAMILY'S RESPONSE TO PLAN OF CARE: Pt's daughter would prefer for pt to go to rehab instead of return home, but is willing to assist pt in arranging private duty care if needed as this is her desire. CSW signing off but can be reconsulted if needed.       Derenda Fennel, [place] [phone]

## 2013-02-07 NOTE — Evaluation (Signed)
Physical Therapy Evaluation Patient Details Name: Kara Santos MRN: [id number] DOB: [birth year] Today's Date: 02/07/2013 Time: 1478-2956 PT Time Calculation (min): 42 min  PT Assessment / Plan / Recommendation Clinical Impression  Pt was seen for evaluation following a L hip fx.  She is a delightful 79 year old who is normally very independent at home, daughter lives on the adjoining property.  She has had some falls in the recent past per her report.  She has 6 steps to enter her home.  She is having moderate pain in the left hip and therefore has weakness.  Her transfer to EOB is extremely labored but she was able to do it under her own power.  She had difficulty achieving a correct TTWB gait with the LLE and was only able to ambulate 15' with a walker.  I  am recommending CIR at d/c in order to have the maximal functional ability prior to returning home as I am concerned that she is a high risk for another fall at this time.            PT Assessment  Patient needs continued PT services    Follow Up Recommendations  CIR    Does the patient have the potential to tolerate intense rehabilitation      Barriers to Discharge Inaccessible home environment;Decreased caregiver support      Equipment Recommendations  Rolling walker with 5" wheels (BSC)    Recommendations for Other Services OT consult   Frequency 7X/week    Precautions / Restrictions Precautions Precautions: Fall Restrictions Weight Bearing Restrictions: Yes LLE Weight Bearing: Touchdown weight bearing   Pertinent Vitals/Pain       Mobility  Bed Mobility Bed Mobility: Rolling Left;Sit to Sidelying Left Rolling Right:  (extremely labored) Rolling Left: 4: Min guard;With rail Sit to Sidelying Left: 4: Min assist (extremely labored) Details for Bed Mobility Assistance: pt c/o significant pain in left hip during transfer Transfers Transfers: Sit to Stand;Stand to Sit Sit to Stand: 4: Min guard;With upper extremity  assist;From bed Stand to Sit: 4: Min guard;With upper extremity assist;To chair/3-in-1 Details for Transfer Assistance: cues for correct hand placement Ambulation/Gait Ambulation/Gait Assistance: 4: Min assist Ambulation Distance (Feet): 15 Feet Assistive device: Rolling walker Gait Pattern: Trunk flexed;Decreased step length - left;Decreased stance time - left Gait velocity: very slow and labored General Gait Details: pt has great difficulty achieving correct TTWB Stairs: No Wheelchair Mobility Wheelchair Mobility: No    Exercises General Exercises - Lower Extremity Ankle Circles/Pumps: AROM;5 reps;Both;Supine (developed cramping in the left toes) Short Arc Quad: AROM;Left;10 reps;Supine Heel Slides: AAROM;Left;10 reps;Supine   PT Diagnosis: Difficulty walking;Abnormality of gait;Generalized weakness;Acute pain  PT Problem List: Decreased strength;Decreased activity tolerance;Decreased mobility;Decreased knowledge of use of DME;Decreased safety awareness;Decreased knowledge of precautions;Pain PT Treatment Interventions: DME instruction;Gait training;Stair training;Functional mobility training;Therapeutic exercise;Therapeutic activities;Patient/family education   PT Goals Acute Rehab PT Goals PT Goal Formulation: With patient/family Time For Goal Achievement: 02/21/13 Potential to Achieve Goals: Good Pt will go Supine/Side to Sit: with supervision;with HOB 0 degrees PT Goal: Supine/Side to Sit - Progress: Goal set today Pt will go Sit to Supine/Side: with supervision;with HOB 0 degrees PT Goal: Sit to Supine/Side - Progress: Goal set today Pt will go Sit to Stand: with modified independence;with upper extremity assist PT Goal: Sit to Stand - Progress: Goal set today Pt will go Stand to Sit: with modified independence;with upper extremity assist PT Goal: Stand to Sit - Progress: Goal set today Pt will  Transfer Bed to Chair/Chair to Bed: with supervision PT Transfer Goal: Bed to  Chair/Chair to Bed - Progress: Goal set today Pt will Ambulate: 16 - 50 feet;with supervision;with rolling walker PT Goal: Ambulate - Progress: Goal set today Pt will Go Up / Down Stairs: 3-5 stairs;with rolling walker;with rail(s);with mod assist PT Goal: Up/Down Stairs - Progress: Goal set today  Visit Information  Last PT Received On: 02/07/13 Assistance Needed: +1 PT/OT Co-Evaluation/Treatment: Yes    Subjective Data  Subjective: pt states that she lives alone and uses a cane...she is totally independent with all ADLs, including driving Patient Stated Goal: return to normal   Prior Functioning  Home Living Lives With: Alone Available Help at Discharge: Family;Available PRN/intermittently Type of Home: House Home Access: Stairs to enter [employer] of Steps: 6-7 Entrance Stairs-Rails: Right Home Layout: One level Bathroom Shower/Tub: Tub/shower unit (takes baths) Bathroom Toilet: Standard Home Adaptive Equipment: Straight cane Prior Function Level of Independence: Independent with assistive device(s) (uses cane when walking) Able to Take Stairs?: Yes Driving: Yes Vocation: Retired Musician: No difficulties Dominant Hand: Right    Cognition  Cognition Arousal/Alertness: Awake/alert Behavior During Therapy: WFL for tasks assessed/performed Overall Cognitive Status: Within Functional Limits for tasks assessed    Extremity/Trunk Assessment Right Upper Extremity Assessment RUE ROM/Strength/Tone: Within functional levels RUE Coordination: WFL - gross/fine motor Left Upper Extremity Assessment LUE ROM/Strength/Tone: Within functional levels LUE Coordination: WFL - gross/fine motor Right Lower Extremity Assessment RLE ROM/Strength/Tone: WFL for tasks assessed RLE Sensation: WFL - Light Touch RLE Coordination: WFL - gross motor Left Lower Extremity Assessment LLE ROM/Strength/Tone: Deficits LLE ROM/Strength/Tone Deficits: strength at hip  is 3-/5, limited by pain Trunk Assessment Trunk Assessment: Kyphotic   Balance Balance Balance Assessed: No  End of Session PT - End of Session Equipment Utilized During Treatment: Gait belt Activity Tolerance: Patient limited by fatigue;Patient limited by pain Patient left: in chair;with call bell/phone within reach;with family/visitor present Nurse Communication: Mobility status  GP Functional Assessment Tool Used: clinical judgement Functional Limitation: Mobility: Walking and moving around Mobility: Walking and Moving Around Current Status (W0981): At least 20 percent but less than 40 percent impaired, limited or restricted Mobility: Walking and Moving Around Goal Status [phone]): At least 1 percent but less than 20 percent impaired, limited or restricted   Konrad Penta 02/07/2013, 12:10 PM

## 2013-02-07 NOTE — Evaluation (Signed)
Occupational Therapy Evaluation Patient Details Name: Kara Santos MRN: [id number] DOB: 1917/06/20 Today's Date: 02/07/2013 Time: 4098-1191 OT Time Calculation (min): 27 min  OT Assessment / Plan / Recommendation Clinical Impression  Patient is a 79 y/o female s/p Left hip fx presenting to acute OT with deficits below. Patient lives alone and is very motivated to returning home. Patient is very mobile but will not be able to return to taking tub baths and is unable to perform bathing and dressing tasks without assistance. Daughter lives in home behind patient's house. Recommend short stay at CIR to increase ADL performance and provide education regarding DME and safety in the home.      OT Assessment  Patient needs continued OT Services    Follow Up Recommendations  CIR   Barriers to Discharge Inaccessible home environment 6 steps to enter.  Equipment Recommendations  Tub/shower seat;3 in 1 bedside comode       Frequency  Min 2X/week    Precautions / Restrictions Precautions Precautions: Fall   Pertinent Vitals/Pain FACES Left hip pain with movement.    ADL  Lower Body Dressing: Performed;+1 Total assistance Where Assessed - Lower Body Dressing: Supine, head of bed up Toilet Transfer: Performed;Min guard Toilet Transfer Method: Stand pivot Acupuncturist:  (to recliner) Transfers/Ambulation Related to ADLs: Patient transfers at Supervision level with RW.    OT Diagnosis: Generalized weakness  OT Problem List: Pain;Decreased strength;Decreased activity tolerance;Decreased knowledge of use of DME or AE;Impaired balance (sitting and/or standing) OT Treatment Interventions: Self-care/ADL training;Therapeutic activities;Therapeutic exercise;DME and/or AE instruction;Balance training;Patient/family education   OT Goals Acute Rehab OT Goals OT Goal Formulation: With patient ADL Goals Pt Will Perform Lower Body Bathing: with modified independence ADL Goal: Lower Body  Bathing - Progress: Goal set today Pt Will Perform Lower Body Dressing: with modified independence ADL Goal: Lower Body Dressing - Progress: Goal set today Pt Will Transfer to Toilet: with modified independence ADL Goal: Toilet Transfer - Progress: Goal set today Pt Will Perform Toileting - Clothing Manipulation: Independently ADL Goal: Toileting - Clothing Manipulation - Progress: Goal set today Pt Will Perform Toileting - Hygiene: Independently ADL Goal: Toileting - Hygiene - Progress: Goal set today Arm Goals Pt Will Complete Theraband Exer: Independently;to maintain strength;Bilateral upper extremities;2 sets;10 reps;Level 2 Theraband Arm Goal: Theraband Exercises - Progress: Goal set today  Visit Information  Last OT Received On: 02/07/13 Assistance Needed: +1 PT/OT Co-Evaluation/Treatment: Yes    Subjective Data  Subjective: S: I just want to the pain to go away.  Patient Stated Goal: To get better.   Prior Functioning     Home Living Lives With: Alone Available Help at Discharge: Family;Available PRN/intermittently Type of Home: House Home Access: Stairs to enter [employer] of Steps: 6-7 Entrance Stairs-Rails: Right Home Layout: One level Bathroom Shower/Tub: Tub/shower unit (takes baths) Bathroom Toilet: Standard Home Adaptive Equipment: Straight cane Prior Function Level of Independence: Independent with assistive device(s) (uses cane when walking) Able to Take Stairs?: Yes Driving: Yes Vocation: Retired Musician: No difficulties Dominant Hand: Right         Vision/Perception Vision - History Baseline Vision: No visual deficits Patient Visual Report: No change from baseline   Cognition  Cognition Arousal/Alertness: Awake/alert Behavior During Therapy: WFL for tasks assessed/performed Overall Cognitive Status: Within Functional Limits for tasks assessed    Extremity/Trunk Assessment Right Upper Extremity  Assessment RUE ROM/Strength/Tone: Within functional levels RUE Coordination: WFL - gross/fine motor Left Upper Extremity Assessment LUE ROM/Strength/Tone: Within  functional levels LUE Coordination: WFL - gross/fine motor     Mobility Bed Mobility Bed Mobility: Rolling Left;Sit to Sidelying Left Rolling Right:  (extremely labored) Rolling Left: 4: Min guard;With rail Sit to Sidelying Left: 4: Min assist (extremely labored) Details for Bed Mobility Assistance: pt c/o significant pain in left hip during transfer Transfers Sit to Stand: 4: Min guard;With upper extremity assist;From bed Stand to Sit: 4: Min guard;With upper extremity assist;To chair/3-in-1 Details for Transfer Assistance: cues for correct hand placement     Exercise General Exercises - Lower Extremity Ankle Circles/Pumps: AROM;5 reps;Both;Supine (developed cramping in the left toes) Short Arc Quad: AROM;Left;10 reps;Supine Heel Slides: AAROM;Left;10 reps;Supine   Balance Balance Balance Assessed: No   End of Session OT - End of Session Equipment Utilized During Treatment: Gait belt Activity Tolerance: Patient tolerated treatment well Patient left: in chair;with call bell/phone within reach;with family/visitor present  GO Functional Assessment Tool Used: observation Functional Limitation: Self care Self Care Current Status (M0102): At least 60 percent but less than 80 percent impaired, limited or restricted Self Care Goal Status (V2536): At least 1 percent but less than 20 percent impaired, limited or restricted   Limmie Patricia, OTR/L,CBIS   02/07/2013, 1:06 PM

## 2013-02-07 NOTE — Progress Notes (Signed)
Subjective: My hip hurts some   Objective: Vital signs in last 24 hours: Temp:  [97.5 F (36.4 C)-98.5 F (36.9 C)] 98.5 F (36.9 C) (05/05 0555) Pulse Rate:  [81-91] 86 (05/05 0555) Resp:  [18-20] 18 (05/05 1200) BP: (130-161)/(63-99) 130/68 mmHg (05/05 0555) SpO2:  [97 %-99 %] 98 % (05/05 1200) Weight:  [43.7 kg (96 lb 5.5 oz)] 43.7 kg (96 lb 5.5 oz) (05/04 1837)  Intake/Output from previous day:   Intake/Output this shift: Total I/O In: 180 [P.O.:180] Out: -    Recent Labs  02/06/13 1328  HGB 9.7*    Recent Labs  02/06/13 1328  WBC 7.0  RBC 2.85*  HCT 26.9*  PLT 164    Recent Labs  02/06/13 1328  NA 141  K 3.3*  CL 101  CO2 23  BUN 15  CREATININE 0.64  GLUCOSE 127*  CALCIUM 7.4*    Recent Labs  02/06/13 1328  INR 1.07    Neurologically intact Neurovascular intact Sensation intact distally Intact pulses distally Dorsiflexion/Plantar flexion intact  Assessment/Plan: She walked some in PT but still needs assistance.  Her pain is controlled.  Continue PT.   Kara Santos 02/07/2013, 2:26 PM

## 2013-02-08 DIAGNOSIS — E119 Type 2 diabetes mellitus without complications: Secondary | ICD-10-CM | POA: Diagnosis not present

## 2013-02-08 DIAGNOSIS — E876 Hypokalemia: Secondary | ICD-10-CM | POA: Diagnosis not present

## 2013-02-08 DIAGNOSIS — S72009D Fracture of unspecified part of neck of unspecified femur, subsequent encounter for closed fracture with routine healing: Secondary | ICD-10-CM | POA: Diagnosis not present

## 2013-02-08 DIAGNOSIS — Z5189 Encounter for other specified aftercare: Secondary | ICD-10-CM | POA: Diagnosis not present

## 2013-02-08 DIAGNOSIS — E871 Hypo-osmolality and hyponatremia: Secondary | ICD-10-CM | POA: Diagnosis not present

## 2013-02-08 DIAGNOSIS — I10 Essential (primary) hypertension: Secondary | ICD-10-CM | POA: Diagnosis not present

## 2013-02-08 DIAGNOSIS — D638 Anemia in other chronic diseases classified elsewhere: Secondary | ICD-10-CM | POA: Diagnosis not present

## 2013-02-08 DIAGNOSIS — S72009A Fracture of unspecified part of neck of unspecified femur, initial encounter for closed fracture: Secondary | ICD-10-CM | POA: Diagnosis not present

## 2013-02-08 DIAGNOSIS — F329 Major depressive disorder, single episode, unspecified: Secondary | ICD-10-CM | POA: Diagnosis not present

## 2013-02-08 DIAGNOSIS — D649 Anemia, unspecified: Secondary | ICD-10-CM | POA: Diagnosis not present

## 2013-02-08 DIAGNOSIS — E039 Hypothyroidism, unspecified: Secondary | ICD-10-CM | POA: Diagnosis not present

## 2013-02-08 DIAGNOSIS — R5381 Other malaise: Secondary | ICD-10-CM | POA: Diagnosis not present

## 2013-02-08 DIAGNOSIS — Z22322 Carrier or suspected carrier of Methicillin resistant Staphylococcus aureus: Secondary | ICD-10-CM | POA: Diagnosis not present

## 2013-02-08 DIAGNOSIS — K219 Gastro-esophageal reflux disease without esophagitis: Secondary | ICD-10-CM | POA: Diagnosis not present

## 2013-02-08 LAB — CBC WITH DIFFERENTIAL/PLATELET
Basophils Absolute: 0 10*3/uL (ref 0.0–0.1)
Eosinophils Absolute: 0.2 10*3/uL (ref 0.0–0.7)
Eosinophils Relative: 3 % (ref 0–5)
Lymphs Abs: 1 10*3/uL (ref 0.7–4.0)
MCH: 33.6 pg (ref 26.0–34.0)
Neutrophils Relative %: 67 % (ref 43–77)
Platelets: 146 10*3/uL — ABNORMAL LOW (ref 150–400)
RBC: 2.89 MIL/uL — ABNORMAL LOW (ref 3.87–5.11)
RDW: 16.1 % — ABNORMAL HIGH (ref 11.5–15.5)
WBC: 5 10*3/uL (ref 4.0–10.5)

## 2013-02-08 LAB — BASIC METABOLIC PANEL
Calcium: 7.4 mg/dL — ABNORMAL LOW (ref 8.4–10.5)
Creatinine, Ser: 0.57 mg/dL (ref 0.50–1.10)
GFR calc non Af Amer: 77 mL/min — ABNORMAL LOW (ref >90)
Sodium: 141 mEq/L (ref 135–145)

## 2013-02-08 LAB — GLUCOSE, CAPILLARY: Glucose-Capillary: 110 mg/dL — ABNORMAL HIGH (ref 70–99)

## 2013-02-08 MED ORDER — POTASSIUM CHLORIDE CRYS ER 20 MEQ PO TBCR
40.0000 meq | EXTENDED_RELEASE_TABLET | Freq: Once | ORAL | Status: AC
  Administered 2013-02-08: 40 meq via ORAL
  Filled 2013-02-08: qty 2

## 2013-02-08 NOTE — Progress Notes (Signed)
Subjective: I feel a little better   Objective: Vital signs in last 24 hours: Temp:  [97.8 F (36.6 C)-98 F (36.7 C)] 97.8 F (36.6 C) (05/06 0500) Pulse Rate:  [83-85] 83 (05/06 0500) Resp:  [16-20] 16 (05/06 0500) BP: (126-151)/(58-71) 151/65 mmHg (05/06 0500) SpO2:  [98 %] 98 % (05/06 0500)  Intake/Output from previous day: 05/05 0701 - 05/06 0700 In: 420 [P.O.:420] Out: -  Intake/Output this shift:     Recent Labs  02/06/13 1328 02/08/13 0436  HGB 9.7* 9.7*    Recent Labs  02/06/13 1328 02/08/13 0436  WBC 7.0 5.0  RBC 2.85* 2.89*  HCT 26.9* 27.2*  PLT 164 146*    Recent Labs  02/06/13 1328 02/08/13 0436  NA 141 141  K 3.3* 2.9*  CL 101 102  CO2 23 28  BUN 15 9  CREATININE 0.64 0.57  GLUCOSE 127* 101*  CALCIUM 7.4* 7.4*    Recent Labs  02/06/13 1328  INR 1.07    Neurologically intact Neurovascular intact Sensation intact distally Intact pulses distally Dorsiflexion/Plantar flexion intact She did well in therapy yesterday Assessment/Plan: Continue PT.  Advance to more activity with walker.   Ilija Maxim 02/08/2013, 7:21 AM

## 2013-02-08 NOTE — Discharge Summary (Signed)
Physician Discharge Summary  Patient ID: Kara Santos MRN: [id number] DOB/AGE: 11-10-1916 79 y.o. Primary Care Physician:Brion Sossamon L, MD Admit date: 02/06/2013 Discharge date: 02/08/2013    Discharge Diagnoses:   Principal Problem:   Closed fracture of greater trochanter of femur Active Problems:   Anemia   Chronic gastrointestinal bleeding   Diabetes mellitus type 2 in nonobese   Hypothyroidism   History of thyroid cancer   Iron deficiency anemia   History of fall   Hypokalemia     Medication List    ASK your doctor about these medications       ACIPHEX 20 MG tablet  Generic drug:  RABEprazole  Take 20 mg by mouth Daily.     escitalopram 10 MG tablet  Commonly known as:  LEXAPRO  Take 1 tablet (10 mg total) by mouth daily.     levothyroxine 100 MCG tablet  Commonly known as:  SYNTHROID, LEVOTHROID  Take 100 mcg by mouth daily.     metFORMIN 500 MG (MOD) 24 hr tablet  Commonly known as:  GLUMETZA  Take 1,000 mg by mouth 2 (two) times daily.     multivitamin with minerals Tabs  Take 1 tablet by mouth daily.        Discharged Condition: Improved    Consults: Orthopedics  Significant Diagnostic Studies: Dg Chest 1 View  02/06/2013  *RADIOLOGY REPORT*  Clinical Data: Fall  CHEST - 1 VIEW  Comparison: 11/21/2009  Findings: Cardiomediastinal silhouette is stable.  No acute infiltrate or pleural effusion.  No pulmonary edema.  Bony thorax is unremarkable.  IMPRESSION: No active disease.  No significant change.   Original Report Authenticated By: Natasha Mead, M.D.    Dg Hip Complete Left  02/06/2013  *RADIOLOGY REPORT*  Clinical Data: Pelvic pain  LEFT HIP - COMPLETE 2+ VIEW  Comparison: None.  Findings: Nondisplaced fracture is seen involving the greater trochanter of the proximal left femur.  No dislocation is noted. The proximal left femoral head and neck appear normal.  Narrowing of both hip joints is noted consistent with degenerative joint disease.   IMPRESSION: Nondisplaced fracture involving greater trochanter of the proximal left femur.   Original Report Authenticated By: Lupita Raider.,  M.D.    Ct Head Wo Contrast  02/06/2013  *RADIOLOGY REPORT*  Clinical Data:  Fall with neck pain.  CT HEAD WITHOUT CONTRAST CT CERVICAL SPINE WITHOUT CONTRAST  Technique:  Multidetector CT imaging of the head and cervical spine was performed following the standard protocol without intravenous contrast.  Multiplanar CT image reconstructions of the cervical spine were also generated.  Comparison:  Head CT 10/29/2012 and CT  03/25/2007  CT HEAD  Findings: There is mild cerebral atrophy.  Diffuse low density in the periventricular and subcortical white matter appears chronic. No evidence for acute hemorrhage, mass lesion, midline shift, hydrocephalus or large infarct.  No acute bony abnormality.  IMPRESSION: No acute intracranial abnormality.  Atrophy and evidence for chronic small vessel ischemic changes.  CT CERVICAL SPINE  Findings: The right mandibular condyle appears to be anterior subluxed or dislocated.  There is multilevel cervical spondylosis without acute bony abnormality.  No evidence for an apical pneumothorax.  There is no significant soft tissue swelling in the neck.  There is marked disc space narrowing at C5-C6.  Mild anterolisthesis at C5-C6.  The alignment of the cervical spine is unchanged.  IMPRESSION: Cervical spondylosis, particularly at C5-C6.  No acute bony abnormality.  The right mandibular condyle is anteriorly subluxed.  Original Report Authenticated By: Richarda Overlie, M.D.    Ct Cervical Spine Wo Contrast  02/06/2013  *RADIOLOGY REPORT*  Clinical Data:  Fall with neck pain.  CT HEAD WITHOUT CONTRAST CT CERVICAL SPINE WITHOUT CONTRAST  Technique:  Multidetector CT imaging of the head and cervical spine was performed following the standard protocol without intravenous contrast.  Multiplanar CT image reconstructions of the cervical spine were also  generated.  Comparison:  Head CT 10/29/2012 and CT  03/25/2007  CT HEAD  Findings: There is mild cerebral atrophy.  Diffuse low density in the periventricular and subcortical white matter appears chronic. No evidence for acute hemorrhage, mass lesion, midline shift, hydrocephalus or large infarct.  No acute bony abnormality.  IMPRESSION: No acute intracranial abnormality.  Atrophy and evidence for chronic small vessel ischemic changes.  CT CERVICAL SPINE  Findings: The right mandibular condyle appears to be anterior subluxed or dislocated.  There is multilevel cervical spondylosis without acute bony abnormality.  No evidence for an apical pneumothorax.  There is no significant soft tissue swelling in the neck.  There is marked disc space narrowing at C5-C6.  Mild anterolisthesis at C5-C6.  The alignment of the cervical spine is unchanged.  IMPRESSION: Cervical spondylosis, particularly at C5-C6.  No acute bony abnormality.  The right mandibular condyle is anteriorly subluxed.   Original Report Authenticated By: Richarda Overlie, M.D.    Ct Pelvis Wo Contrast  02/06/2013  *RADIOLOGY REPORT*  Clinical Data: Pelvic pain.  Status post fall.  Left hip fracture.  CT PELVIS WITHOUT CONTRAST  Technique:  Multidetector CT imaging of the pelvis was performed following the standard protocol without intravenous contrast.  Comparison: Left hip radiographs same date.  Findings: The bones are diffusely demineralized.  As demonstrated on the earlier radiographs, there is a nondisplaced fracture involving the left femoral greater trochanter.  No definite intertrochanteric extension of the fracture is identified, although the component involving the greater trochanter is quite subtle in itself. There is a probable bone island within the right iliac crest.  There is no evidence of pelvic fracture or dislocation.  Mild degenerative changes are present the lower lumbar spine facet joints.  The sacroiliac joints are intact.  There is mild  asymmetric subcutaneous edema in the left buttock. No large hematoma is identified.  There are diffuse vascular calcifications within the pelvis.  IMPRESSION:  1.  Nondisplaced fracture of the left femoral greater trochanter. This fracture does not demonstrate any definite intertrochanteric extension.  However, if the patient has inability to bear weight, follow-up imaging may be warranted. 2.  No large hematoma identified.   Original Report Authenticated By: Carey Bullocks, M.D.     Lab Results: Basic Metabolic Panel:  Recent Labs  84/69/62 1328 02/08/13 0436  NA 141 141  K 3.3* 2.9*  CL 101 102  CO2 23 28  GLUCOSE 127* 101*  BUN 15 9  CREATININE 0.64 0.57  CALCIUM 7.4* 7.4*   Liver Function Tests:  Recent Labs  02/06/13 1328  AST 33  ALT 17  ALKPHOS 71  BILITOT 0.4  PROT 6.0  ALBUMIN 3.5     CBC:  Recent Labs  02/06/13 1328 02/08/13 0436  WBC 7.0 5.0  NEUTROABS 5.3 3.3  HGB 9.7* 9.7*  HCT 26.9* 27.2*  MCV 94.4 94.1  PLT 164 146*    No results found for this or any previous visit (from the past 240 hour(s)).   Hospital Course: She was admitted after falling approximately a week prior  to admission. She then fell again. When she came to the emergency department she was noted to have a closed fracture of the greater trochanter. She has multiple other medical problems but does remarkably well considering her advanced age. She had physical therapy occupational therapy consultation and it was felt that she needed rehabilitation. A rehabilitation facility was located that had an available bed and she will be transferred there. She was hypokalemic and had a potassium prior to discharge  Discharge Exam: Blood pressure 151/65, pulse 83, temperature 97.8 F (36.6 C), temperature source Oral, resp. rate 16, height 4\' 11"  (1.499 m), weight 43.7 kg (96 lb 5.5 oz), SpO2 98.00%. She is awake and alert. Her chest is clear. Her heart is regular.  Disposition: To rehabilitation  facility. She will have physical therapy occupational therapy speech therapy as needed. She will be on a regular diet with thin liquids. She will start with toe-touch weightbearing gait with a walker and other physical therapy instructions per her orthopedist Dr. Darreld Mclean. she will be on the following medications: Enoxaparin 30 mg subcutaneously every 24-hour  Lexapro 10 mg daily Norco 5/325 every 6 hours when necessary pain Accu-Cheks a.c. and at bedtime with facility scale sliding scale NovoLog sensitive scale Levothyroxine 100 mcg daily Pantoprazole 40 mg daily Potassium chloride 20 mEq twice a day She will need to have basic metabolic profile and CBC on 02/10/2013 and weekly thereafter.       Future Appointments Provider Department Dept Phone   02/21/2013 12:00 PM [facility] Covering Provider [facility] [phone]        Signed: [name] Pager [phone]  02/08/2013, 8:36 AM

## 2013-02-08 NOTE — Progress Notes (Signed)
Subjective: She says she's sore from doing therapy but otherwise is okay. She has no new complaints.  Objective: Vital signs in last 24 hours: Temp:  [97.8 F (36.6 C)-98 F (36.7 C)] 97.8 F (36.6 C) (05/06 0500) Pulse Rate:  [83-85] 83 (05/06 0500) Resp:  [16-20] 16 (05/06 0500) BP: (126-151)/(58-71) 151/65 mmHg (05/06 0500) SpO2:  [98 %] 98 % (05/06 0500) Weight change:  Last BM Date: 02/07/13  Intake/Output from previous day: 05/05 0701 - 05/06 0700 In: 420 [P.O.:420] Out: -   PHYSICAL EXAM General appearance: alert, cooperative and no distress Resp: clear to auscultation bilaterally Cardio: regular rate and rhythm, S1, S2 normal, no murmur, click, rub or gallop GI: soft, non-tender; bowel sounds normal; no masses,  no organomegaly Extremities: extremities normal, atraumatic, no cyanosis or edema  Lab Results:    Basic Metabolic Panel:  Recent Labs  40/10/27 1328 02/08/13 0436  NA 141 141  K 3.3* 2.9*  CL 101 102  CO2 23 28  GLUCOSE 127* 101*  BUN 15 9  CREATININE 0.64 0.57  CALCIUM 7.4* 7.4*   Liver Function Tests:  Recent Labs  02/06/13 1328  AST 33  ALT 17  ALKPHOS 71  BILITOT 0.4  PROT 6.0  ALBUMIN 3.5   No results found for this basename: LIPASE, AMYLASE,  in the last 72 hours No results found for this basename: AMMONIA,  in the last 72 hours CBC:  Recent Labs  02/06/13 1328 02/08/13 0436  WBC 7.0 5.0  NEUTROABS 5.3 3.3  HGB 9.7* 9.7*  HCT 26.9* 27.2*  MCV 94.4 94.1  PLT 164 146*   Cardiac Enzymes:  Recent Labs  02/06/13 1328  TROPONINI <0.30   BNP: No results found for this basename: PROBNP,  in the last 72 hours D-Dimer: No results found for this basename: DDIMER,  in the last 72 hours CBG:  Recent Labs  02/06/13 2224 02/07/13 0735 02/07/13 1135 02/07/13 1710 02/07/13 2157 02/08/13 0731  GLUCAP 109* 96 91 163* 104* 110*   Hemoglobin A1C: No results found for this basename: HGBA1C,  in the last 72  hours Fasting Lipid Panel: No results found for this basename: CHOL, HDL, LDLCALC, TRIG, CHOLHDL, LDLDIRECT,  in the last 72 hours Thyroid Function Tests: No results found for this basename: TSH, T4TOTAL, FREET4, T3FREE, THYROIDAB,  in the last 72 hours Anemia Panel: No results found for this basename: VITAMINB12, FOLATE, FERRITIN, TIBC, IRON, RETICCTPCT,  in the last 72 hours Coagulation:  Recent Labs  02/06/13 1328  LABPROT 13.8  INR 1.07   Urine Drug Screen: Drugs of Abuse  No results found for this basename: labopia, cocainscrnur, labbenz, amphetmu, thcu, labbarb    Alcohol Level: No results found for this basename: ETH,  in the last 72 hours Urinalysis: No results found for this basename: COLORURINE, APPERANCEUR, LABSPEC, PHURINE, GLUCOSEU, HGBUR, BILIRUBINUR, KETONESUR, PROTEINUR, UROBILINOGEN, NITRITE, LEUKOCYTESUR,  in the last 72 hours Misc. Labs:  ABGS No results found for this basename: PHART, PCO2, PO2ART, TCO2, HCO3,  in the last 72 hours CULTURES No results found for this or any previous visit (from the past 240 hour(s)). Studies/Results: Dg Chest 1 View  02/06/2013  *RADIOLOGY REPORT*  Clinical Data: Fall  CHEST - 1 VIEW  Comparison: 11/21/2009  Findings: Cardiomediastinal silhouette is stable.  No acute infiltrate or pleural effusion.  No pulmonary edema.  Bony thorax is unremarkable.  IMPRESSION: No active disease.  No significant change.   Original Report Authenticated By: Natasha Mead, M.D.  Dg Hip Complete Left  02/06/2013  *RADIOLOGY REPORT*  Clinical Data: Pelvic pain  LEFT HIP - COMPLETE 2+ VIEW  Comparison: None.  Findings: Nondisplaced fracture is seen involving the greater trochanter of the proximal left femur.  No dislocation is noted. The proximal left femoral head and neck appear normal.  Narrowing of both hip joints is noted consistent with degenerative joint disease.  IMPRESSION: Nondisplaced fracture involving greater trochanter of the proximal left  femur.   Original Report Authenticated By: Lupita Raider.,  M.D.    Ct Head Wo Contrast  02/06/2013  *RADIOLOGY REPORT*  Clinical Data:  Fall with neck pain.  CT HEAD WITHOUT CONTRAST CT CERVICAL SPINE WITHOUT CONTRAST  Technique:  Multidetector CT imaging of the head and cervical spine was performed following the standard protocol without intravenous contrast.  Multiplanar CT image reconstructions of the cervical spine were also generated.  Comparison:  Head CT 10/29/2012 and CT  03/25/2007  CT HEAD  Findings: There is mild cerebral atrophy.  Diffuse low density in the periventricular and subcortical white matter appears chronic. No evidence for acute hemorrhage, mass lesion, midline shift, hydrocephalus or large infarct.  No acute bony abnormality.  IMPRESSION: No acute intracranial abnormality.  Atrophy and evidence for chronic small vessel ischemic changes.  CT CERVICAL SPINE  Findings: The right mandibular condyle appears to be anterior subluxed or dislocated.  There is multilevel cervical spondylosis without acute bony abnormality.  No evidence for an apical pneumothorax.  There is no significant soft tissue swelling in the neck.  There is marked disc space narrowing at C5-C6.  Mild anterolisthesis at C5-C6.  The alignment of the cervical spine is unchanged.  IMPRESSION: Cervical spondylosis, particularly at C5-C6.  No acute bony abnormality.  The right mandibular condyle is anteriorly subluxed.   Original Report Authenticated By: Richarda Overlie, M.D.    Ct Cervical Spine Wo Contrast  02/06/2013  *RADIOLOGY REPORT*  Clinical Data:  Fall with neck pain.  CT HEAD WITHOUT CONTRAST CT CERVICAL SPINE WITHOUT CONTRAST  Technique:  Multidetector CT imaging of the head and cervical spine was performed following the standard protocol without intravenous contrast.  Multiplanar CT image reconstructions of the cervical spine were also generated.  Comparison:  Head CT 10/29/2012 and CT  03/25/2007  CT HEAD  Findings: There  is mild cerebral atrophy.  Diffuse low density in the periventricular and subcortical white matter appears chronic. No evidence for acute hemorrhage, mass lesion, midline shift, hydrocephalus or large infarct.  No acute bony abnormality.  IMPRESSION: No acute intracranial abnormality.  Atrophy and evidence for chronic small vessel ischemic changes.  CT CERVICAL SPINE  Findings: The right mandibular condyle appears to be anterior subluxed or dislocated.  There is multilevel cervical spondylosis without acute bony abnormality.  No evidence for an apical pneumothorax.  There is no significant soft tissue swelling in the neck.  There is marked disc space narrowing at C5-C6.  Mild anterolisthesis at C5-C6.  The alignment of the cervical spine is unchanged.  IMPRESSION: Cervical spondylosis, particularly at C5-C6.  No acute bony abnormality.  The right mandibular condyle is anteriorly subluxed.   Original Report Authenticated By: Richarda Overlie, M.D.    Ct Pelvis Wo Contrast  02/06/2013  *RADIOLOGY REPORT*  Clinical Data: Pelvic pain.  Status post fall.  Left hip fracture.  CT PELVIS WITHOUT CONTRAST  Technique:  Multidetector CT imaging of the pelvis was performed following the standard protocol without intravenous contrast.  Comparison: Left hip radiographs same  date.  Findings: The bones are diffusely demineralized.  As demonstrated on the earlier radiographs, there is a nondisplaced fracture involving the left femoral greater trochanter.  No definite intertrochanteric extension of the fracture is identified, although the component involving the greater trochanter is quite subtle in itself. There is a probable bone island within the right iliac crest.  There is no evidence of pelvic fracture or dislocation.  Mild degenerative changes are present the lower lumbar spine facet joints.  The sacroiliac joints are intact.  There is mild asymmetric subcutaneous edema in the left buttock. No large hematoma is identified.  There  are diffuse vascular calcifications within the pelvis.  IMPRESSION:  1.  Nondisplaced fracture of the left femoral greater trochanter. This fracture does not demonstrate any definite intertrochanteric extension.  However, if the patient has inability to bear weight, follow-up imaging may be warranted. 2.  No large hematoma identified.   Original Report Authenticated By: Carey Bullocks, M.D.     Medications:  Prior to Admission:  Prescriptions prior to admission  Medication Sig Dispense Refill  . ACIPHEX 20 MG tablet Take 20 mg by mouth Daily.       [name] Kitchen levothyroxine (SYNTHROID, LEVOTHROID) 100 MCG tablet Take 100 mcg by mouth daily.      . metFORMIN (GLUMETZA) 500 MG (MOD) 24 hr tablet Take 1,000 mg by mouth 2 (two) times daily.      . Multiple Vitamin (MULITIVITAMIN WITH MINERALS) TABS Take 1 tablet by mouth daily.       Scheduled: . enoxaparin (LOVENOX) injection  30 mg Subcutaneous Q24H  . escitalopram  10 mg Oral Daily  . insulin aspart  0-9 Units Subcutaneous TID WC  . levothyroxine  100 mcg Oral QAC breakfast  . pantoprazole  40 mg Oral Daily  . potassium chloride  40 mEq Oral Once  . sodium chloride  3 mL Intravenous Q12H   Continuous:  ZOX:WRUEAV chloride, HYDROcodone-acetaminophen, morphine injection, sodium chloride  Assesment: She has a closed fracture of the greater trochanter of the femur. This morning her potassium is low. 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 She has diabetes. She has a history of thyroid cancer and hypothyroidism from that Principal Problem:   Closed fracture of greater trochanter of femur Active Problems:   Anemia   Diabetes mellitus type 2 in nonobese   History of fall   Hypokalemia    Plan: She has a bed available at a rehabilitation facility and I plan to transfer her today. She will receive potassium before she goes    LOS: 2 days   Santonio Speakman L 02/08/2013, 8:32 AM

## 2013-02-08 NOTE — Progress Notes (Signed)
Transferred to [facility], reported to P.Cheree Ditto, RN at [facility], patient out in stable condition via [facility], general practice.

## 2013-02-08 NOTE — Progress Notes (Signed)
Occupational Therapy Treatment Patient Details Name: Kara Santos MRN: [id number] DOB: [birth year] Today's Date: 02/08/2013 Time: 4098-1191 OT Time Calculation (min): 14 min  OT Assessment / Plan / Recommendation Comments on Treatment Session Patient agreeable to transfer into recliner. States that she is discharging to [facility] today for rehab. Patient required vc's for weightbearing status. Had some difficulty due to height of RW. Will D/C from OT services due to hospital discharge this AM.                    Plan Discharge plan remains appropriate    Precautions / Restrictions Precautions Precautions: Fall Restrictions Weight Bearing Restrictions: Yes LLE Weight Bearing: Touchdown weight bearing   Pertinent Vitals/Pain No complaints.     ADL  Toilet Transfer: Performed Toilet Transfer Method: Surveyor, minerals:  (to recliner)      OT Goals ADL Goals Pt Will Perform Toileting - Hygiene: Independently ADL Goal: Toileting - Hygiene - Progress: Progressing toward goals  Visit Information  Last OT Received On: 02/08/13 Assistance Needed: +1    Subjective Data  Subjective: S: I'm leaving for [facility] today. Patient Stated Goal: To be able to walk better.       Cognition  Cognition Arousal/Alertness: Awake/alert Behavior During Therapy: WFL for tasks assessed/performed Overall Cognitive Status: Within Functional Limits for tasks assessed    Mobility  Bed Mobility Bed Mobility: Supine to Sit Supine to Sit: 5: Supervision Transfers Transfers: Sit to Stand;Stand to Sit Sit to Stand: 5: Supervision;With upper extremity assist;From bed Stand to Sit: 5: Supervision;With upper extremity assist;To chair/3-in-1 Details for Transfer Assistance: Patient required vc's for hand placement and weightbearing status.          End of Session OT - End of Session Activity Tolerance: Patient tolerated treatment well Patient left: in chair;with call  bell/phone within reach;with family/visitor present  GO Functional Assessment Tool Used: observation Functional Limitation: Self care Self Care Current Status ([id number]): At least 40 percent but less than 60 percent impaired, limited or restricted Self Care Goal Status (F6213): At least 1 percent but less than 20 percent impaired, limited or restricted Self Care Discharge Status [phone]): At least 40 percent but less than 60 percent impaired, limited or restricted   Limmie Patricia, OTR/L,CBIS   02/08/2013, 9:48 AM

## 2013-02-14 ENCOUNTER — Other Ambulatory Visit (HOSPITAL_COMMUNITY): Payer: Medicare Other

## 2013-02-17 DIAGNOSIS — D649 Anemia, unspecified: Secondary | ICD-10-CM | POA: Diagnosis not present

## 2013-02-17 DIAGNOSIS — Z9181 History of falling: Secondary | ICD-10-CM | POA: Diagnosis not present

## 2013-02-17 DIAGNOSIS — E119 Type 2 diabetes mellitus without complications: Secondary | ICD-10-CM | POA: Diagnosis not present

## 2013-02-17 DIAGNOSIS — Z5189 Encounter for other specified aftercare: Secondary | ICD-10-CM | POA: Diagnosis not present

## 2013-02-17 DIAGNOSIS — S72009D Fracture of unspecified part of neck of unspecified femur, subsequent encounter for closed fracture with routine healing: Secondary | ICD-10-CM | POA: Diagnosis not present

## 2013-02-17 DIAGNOSIS — F3289 Other specified depressive episodes: Secondary | ICD-10-CM | POA: Diagnosis not present

## 2013-02-21 ENCOUNTER — Encounter (HOSPITAL_COMMUNITY): Payer: Self-pay

## 2013-02-21 ENCOUNTER — Telehealth (HOSPITAL_COMMUNITY): Payer: Self-pay

## 2013-02-21 ENCOUNTER — Encounter (HOSPITAL_COMMUNITY): Payer: Medicare Other | Attending: Hematology and Oncology

## 2013-02-21 VITALS — BP 123/66 | HR 81 | Temp 97.6°F | Resp 16 | Wt 88.2 lb

## 2013-02-21 DIAGNOSIS — K922 Gastrointestinal hemorrhage, unspecified: Secondary | ICD-10-CM | POA: Insufficient documentation

## 2013-02-21 DIAGNOSIS — D509 Iron deficiency anemia, unspecified: Secondary | ICD-10-CM | POA: Diagnosis not present

## 2013-02-21 DIAGNOSIS — D649 Anemia, unspecified: Secondary | ICD-10-CM | POA: Insufficient documentation

## 2013-02-21 LAB — CBC WITH DIFFERENTIAL/PLATELET
Basophils Absolute: 0.1 10*3/uL (ref 0.0–0.1)
Basophils Relative: 1 % (ref 0–1)
Eosinophils Absolute: 0.1 10*3/uL (ref 0.0–0.7)
Eosinophils Relative: 2 % (ref 0–5)
HCT: 30.5 % — ABNORMAL LOW (ref 36.0–46.0)
Hemoglobin: 10.4 g/dL — ABNORMAL LOW (ref 12.0–15.0)
MCH: 32.5 pg (ref 26.0–34.0)
MCHC: 34.1 g/dL (ref 30.0–36.0)
MCV: 95.3 fL (ref 78.0–100.0)
Monocytes Absolute: 0.4 10*3/uL (ref 0.1–1.0)
Monocytes Relative: 7 % (ref 3–12)
Neutro Abs: 4.6 10*3/uL (ref 1.7–7.7)
RDW: 15 % (ref 11.5–15.5)

## 2013-02-21 LAB — BASIC METABOLIC PANEL
BUN: 16 mg/dL (ref 6–23)
CO2: 27 mEq/L (ref 19–32)
Chloride: 97 mEq/L (ref 96–112)
Creatinine, Ser: 0.58 mg/dL (ref 0.50–1.10)
GFR calc Af Amer: 89 mL/min — ABNORMAL LOW (ref >90)
Glucose, Bld: 141 mg/dL — ABNORMAL HIGH (ref 70–99)
Potassium: 3.7 mEq/L (ref 3.5–5.1)

## 2013-02-21 LAB — IRON AND TIBC: Saturation Ratios: 22 % (ref 20–55)

## 2013-02-21 NOTE — Progress Notes (Signed)
Kara Santos presented for labwork. Labs per MD order drawn via Peripheral Line 23 gauge needle inserted in left AC  Good blood return present. Procedure without incident.  Needle removed intact. Patient tolerated procedure well.

## 2013-02-21 NOTE — Patient Instructions (Addendum)
[facility] Discharge Instructions  RECOMMENDATIONS MADE BY THE CONSULTANT AND ANY TEST RESULTS WILL BE SENT TO YOUR REFERRING PHYSICIAN.  EXAM FINDINGS BY THE PHYSICIAN TODAY AND SIGNS OR SYMPTOMS TO REPORT TO CLINIC OR PRIMARY PHYSICIAN: Exam and discussion by Dr. Sharia Reeve.  We will check your iron stores and your potassium.  We will call you with any problems.  MEDICATIONS PRESCRIBED:  none  INSTRUCTIONS GIVEN AND DISCUSSED: Report increased fatigue, shortness of breath or other problems.  SPECIAL INSTRUCTIONS/FOLLOW-UP: Follow-up in 3 weeks.  Thank you for choosing [facility] to provide your oncology and hematology care.  To afford each patient quality time with our providers, please arrive at least 15 minutes before your scheduled appointment time.  With your help, our goal is to use those 15 minutes to complete the necessary work-up to ensure our physicians have the information they need to help with your evaluation and healthcare recommendations.    Effective January 1st, 2014, we ask that you re-schedule your appointment with our physicians should you arrive 10 or more minutes late for your appointment.  We strive to give you quality time with our providers, and arriving late affects you and other patients whose appointments are after yours.    Again, thank you for choosing [facility].  Our hope is that these requests will decrease the amount of time that you wait before being seen by our physicians.       _____________________________________________________________  Should you have questions after your visit to [facility], please contact our office at [phone] between the hours of 8:30 a.m. and 5:00 p.m.  Voicemails left after 4:30 p.m. will not be returned until the following business day.  For prescription refill requests, have your pharmacy contact our office with your prescription refill request.

## 2013-02-21 NOTE — Telephone Encounter (Signed)
Message left on daughter's voicemail regarding follow-up appointment in 3 months and preliminary blood test reports.  To call back with questions.

## 2013-02-21 NOTE — Progress Notes (Addendum)
Kara Santos KitchenMarland KitchenMarland Kitchen........................................................      [facility] Telephone:([phone]) [phone]   [phone]  DIAGNOSIS: IRON DEFICIENCY  INTERVAL HISTORY: Kara Santos 79 y.o. female  Who returns to the clinic today for  Follow up of  iron deficiency anemia requiring intravenous iron. 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 She was recently hospitalized  According to her discharge summary after falling approximately a week prior to admission. she was noted to have a closed fracture of the greater trochanter. She was sent to a rehabilitation facility where she was discharged home last week. She is here to with her Daughter Kara Santos. She denies fatigue or SOB and states that overall she is dong well. She had chronic hearing loss. Reportedly did no receive any blood transfusion while hospitalized.  MEDICAL HISTORY: Past Medical History  Diagnosis Date  . Anemia 11/07/2011    chronic IDA requiring multiple transfusions  . Diabetes mellitus   . Hypothyroidism     s/p thyroid surgery for thyroid cancer  . GERD (gastroesophageal reflux disease)   . Iron deficiency anemia 05/05/2012  . Depression   . Closed fracture of greater trochanter of femur 02/2013    left    ALLERGIES:  has No Known Allergies.  MEDICATIONS:  Current Outpatient Prescriptions  Medication Sig Dispense Refill  . acetaminophen (TYLENOL) 325 MG tablet Take 650 mg by mouth every 4 (four) hours as needed for pain.      . cyanocobalamin 100 MCG tablet Take 100 mcg by mouth daily.      Kara Santos Kitchen escitalopram (LEXAPRO) 10 MG tablet Take 10 mg by mouth daily.      Kara Santos Kitchen METFORMIN HCL PO Take 250 mg by mouth 2 (two) times daily.      . Multiple Vitamin (MULTIVITAMIN) tablet Take 1 tablet by mouth daily.      . RABEprazole (ACIPHEX) 20 MG tablet Take 20 mg by mouth daily.      . clorazepate (TRANXENE) 7.5 MG tablet Take 7.5 mg by mouth 3 (three) times daily as  needed for anxiety.       No current facility-administered medications for this visit.    SURGICAL HISTORY:  Past Surgical History  Procedure Laterality Date  . Esophagogastroduodenoscopy  08/2006    Dr. Ronni Rumble [facility], large duodenal diverticulum  . Colonoscopy  08/2006    Dr. Lolita Patella sided diverticulosis  . Givens small bowel capsule  08/2006    few gastric erosions, numerous small bowel AVMs with SB erosions  . Double balloon enteroscopy  06/1008    [facility]-->APC of 12 SB AVMs in jejunum. 6 feet of jejunum inspected  . Right shoulder surgery    . Cholecystectomy    . Appendectomy    . Bilateral cataract extractions    . Breast reduction surgery    . Thyroid surgery      thyroid cancer    REVIEW OF SYSTEMS: As in the history above.  PHYSICAL EXAMINATION: Blood pressure 123/66, pulse 81, temperature 97.6 F (36.4 C), temperature source Oral, resp. rate 16, weight 88 lb 3.2 oz (40.007 kg). Chest: Few basal crackles otherwise clear. CVS: S1,S2 no S3.Regular. Abd: Non tender no organomegaly. LEGS: No edema YNW:GNFAO and oriented.Hearing loss   LABORATORY DATA: Lab Results  Component Value Date   WBC 5.0 02/08/2013   HGB 9.7* 02/08/2013   HCT 27.2* 02/08/2013   MCV 94.1 02/08/2013   PLT 146* 02/08/2013  Chemistry      Component Value Date/Time   NA 141 02/08/2013 0436   K 2.9* 02/08/2013 0436   CL 102 02/08/2013 0436   CO2 28 02/08/2013 0436   BUN 9 02/08/2013 0436   CREATININE 0.57 02/08/2013 0436      Component Value Date/Time   CALCIUM 7.4* 02/08/2013 0436   ALKPHOS 71 02/06/2013 1328   AST 33 02/06/2013 1328   ALT 17 02/06/2013 1328   BILITOT 0.4 02/06/2013 1328        ASSESSMENT: Ms Glick has Iron deficiency anemia believed to be due to GI blood loss(AVM). Hb is fairly good from last blood drawn 02/08/13 given recent hip fracture. I will check level today.  PLAN 1.Iron studies today. 2. CBC/BMP( for K) 3.CBC/Iron studies at the next visit in 3 months. 4. Will follow  up with iron studies today and give IV iron if indicated.  All questions were answered.   I spent 15 minutes spent  counseling the patient face to face. The total time spent in the appointment was  30 minutes   Erline Hau, MD , FACP. HEMATOLOGIST/ONCOLOGIST      Addendum: Iron studies shows adequate iron stores.No need for iron infusion at this time and Hb is better.

## 2013-02-25 DIAGNOSIS — R5381 Other malaise: Secondary | ICD-10-CM | POA: Diagnosis not present

## 2013-03-03 DIAGNOSIS — S72109B Unspecified trochanteric fracture of unspecified femur, initial encounter for open fracture type I or II: Secondary | ICD-10-CM | POA: Diagnosis not present

## 2013-03-07 DIAGNOSIS — I10 Essential (primary) hypertension: Secondary | ICD-10-CM | POA: Diagnosis not present

## 2013-03-07 DIAGNOSIS — E109 Type 1 diabetes mellitus without complications: Secondary | ICD-10-CM | POA: Diagnosis not present

## 2013-03-07 DIAGNOSIS — J441 Chronic obstructive pulmonary disease with (acute) exacerbation: Secondary | ICD-10-CM | POA: Diagnosis not present

## 2013-03-07 DIAGNOSIS — M545 Low back pain: Secondary | ICD-10-CM | POA: Diagnosis not present

## 2013-03-07 DIAGNOSIS — J45901 Unspecified asthma with (acute) exacerbation: Secondary | ICD-10-CM | POA: Diagnosis not present

## 2013-03-31 DIAGNOSIS — S72109B Unspecified trochanteric fracture of unspecified femur, initial encounter for open fracture type I or II: Secondary | ICD-10-CM | POA: Diagnosis not present

## 2013-04-18 DIAGNOSIS — I10 Essential (primary) hypertension: Secondary | ICD-10-CM | POA: Diagnosis not present

## 2013-04-18 DIAGNOSIS — E109 Type 1 diabetes mellitus without complications: Secondary | ICD-10-CM | POA: Diagnosis not present

## 2013-04-18 DIAGNOSIS — E039 Hypothyroidism, unspecified: Secondary | ICD-10-CM | POA: Diagnosis not present

## 2013-04-18 DIAGNOSIS — D649 Anemia, unspecified: Secondary | ICD-10-CM | POA: Diagnosis not present

## 2013-05-20 ENCOUNTER — Encounter (HOSPITAL_COMMUNITY): Payer: Medicare Other | Attending: Internal Medicine

## 2013-05-20 DIAGNOSIS — D649 Anemia, unspecified: Secondary | ICD-10-CM

## 2013-05-20 DIAGNOSIS — D509 Iron deficiency anemia, unspecified: Secondary | ICD-10-CM | POA: Diagnosis not present

## 2013-05-20 DIAGNOSIS — K922 Gastrointestinal hemorrhage, unspecified: Secondary | ICD-10-CM

## 2013-05-20 LAB — CBC WITH DIFFERENTIAL/PLATELET
Basophils Relative: 1 % (ref 0–1)
Eosinophils Absolute: 0.1 10*3/uL (ref 0.0–0.7)
Hemoglobin: 6.4 g/dL — CL (ref 12.0–15.0)
Lymphs Abs: 1.2 10*3/uL (ref 0.7–4.0)
MCH: 21.1 pg — ABNORMAL LOW (ref 26.0–34.0)
Neutro Abs: 4.8 10*3/uL (ref 1.7–7.7)
Neutrophils Relative %: 71 % (ref 43–77)
Platelets: 173 10*3/uL (ref 150–400)
RBC: 3.04 MIL/uL — ABNORMAL LOW (ref 3.87–5.11)

## 2013-05-20 LAB — IRON AND TIBC
Saturation Ratios: 5 % — ABNORMAL LOW (ref 20–55)
TIBC: 385 ug/dL (ref 250–470)
UIBC: 366 ug/dL (ref 125–400)

## 2013-05-20 LAB — PREPARE RBC (CROSSMATCH)

## 2013-05-20 LAB — FERRITIN: Ferritin: 6 ng/mL — ABNORMAL LOW (ref 10–291)

## 2013-05-20 NOTE — Progress Notes (Signed)
Elon Jester presented for labwork. Labs per MD order drawn via Peripheral Line 23 gauge needle inserted in right antecubital.  Good blood return present. Procedure without incident.  Needle removed intact. Patient tolerated procedure well.

## 2013-05-20 NOTE — Addendum Note (Signed)
Addended by: Dennie Maizes on: 05/20/2013 12:08 PM   Modules accepted: Orders, SmartSet

## 2013-05-22 IMAGING — CT CT CERVICAL SPINE W/O CM
5 of 6 series · 14 of 33 positions shown, 16 images · non-contrast
Comparison: Head CT 10/29/2012 and CT  03/25/2007

CT HEAD

CLINICAL DATA: Fall with neck pain.

CT HEAD WITHOUT CONTRAST
CT CERVICAL SPINE WITHOUT CONTRAST
TECHNIQUE: Multidetector CT imaging of the head and cervical spine
was performed following the standard protocol without intravenous
contrast.  Multiplanar CT image reconstructions of the cervical
spine were also generated.

[Series 3: headseq 2.4 h60s · axial · 0.47mm/px · z∈[+284,+335]mm · 2 of 60 slices shown, 3 images]
[im 20/60  soft-tissue]
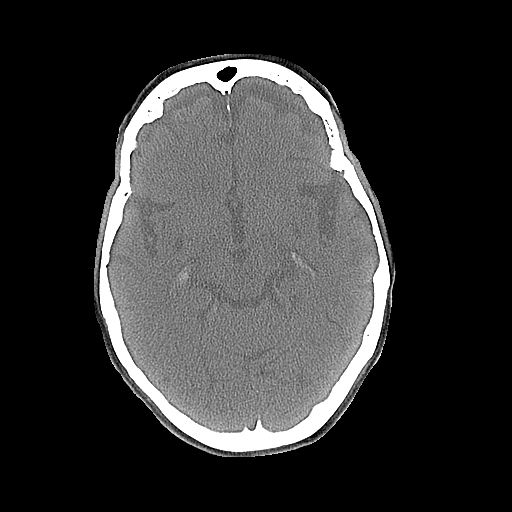
[im 20/60  bone]
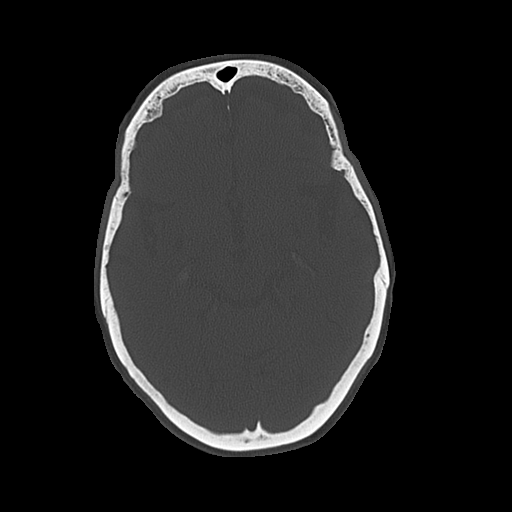
[im 40/60  bone]
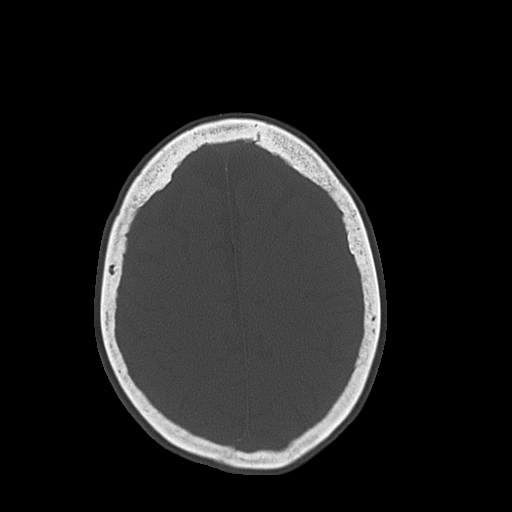

[Series 5: cervical st 2.0 b31s · axial · 0.25mm/px · z∈[+129,+171]mm · 2 of 62 slices shown]
[im 21/62  bone]
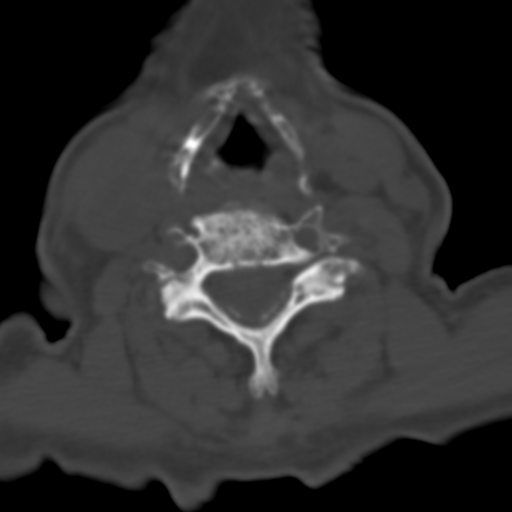
[im 41/62  bone]
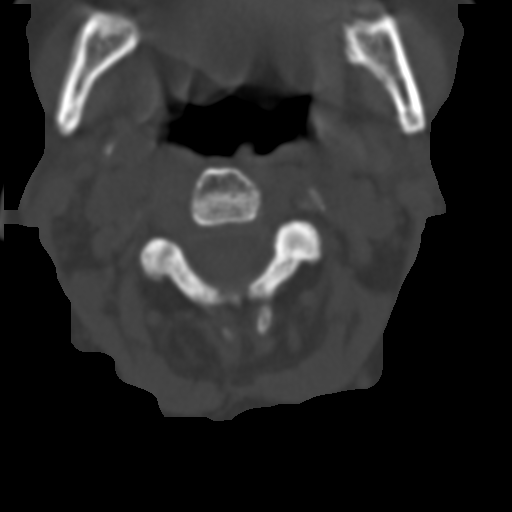

[Series 7: sagittal bone 2.0 · sagittal · 0.24mm/px · 5 of 59 slices shown, 6 images]
[im 20/59  bone]
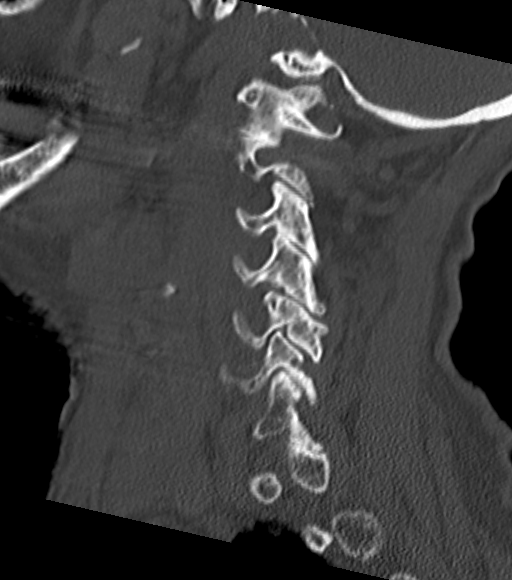
[im 25/59  bone]
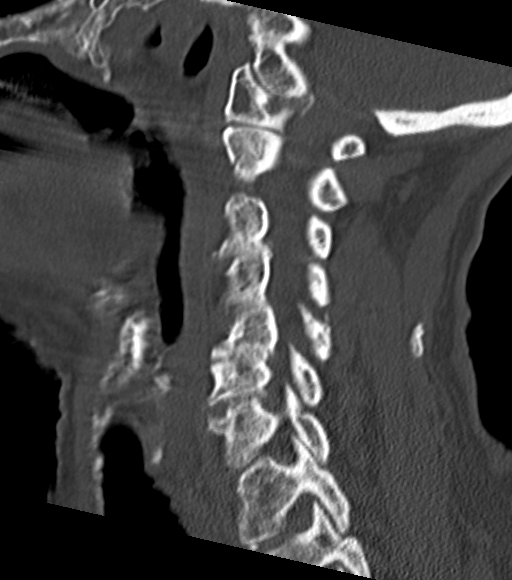
[im 30/59  soft-tissue]
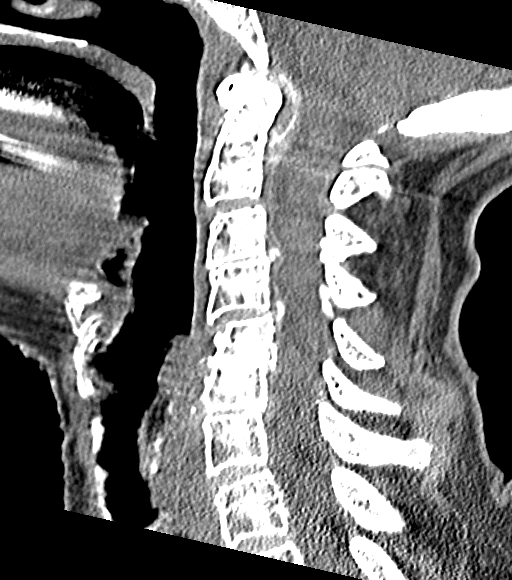
[im 30/59  bone]
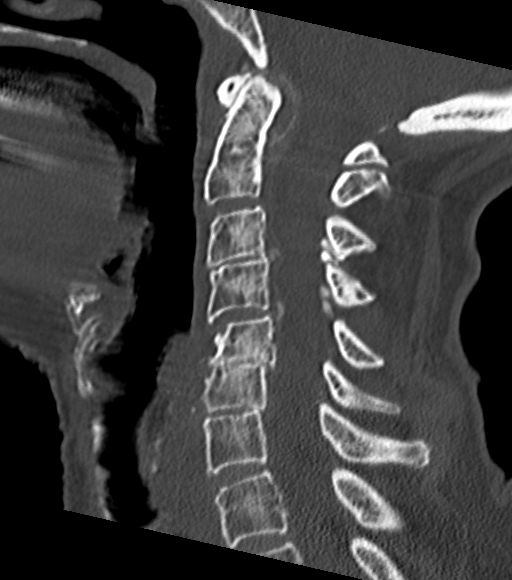
[im 34/59  bone]
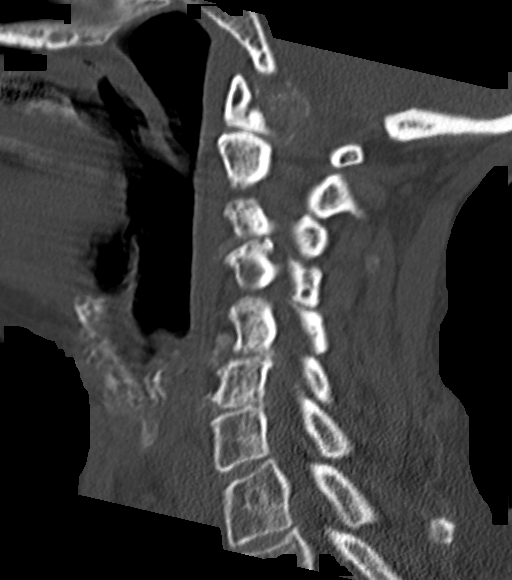
[im 39/59  bone]
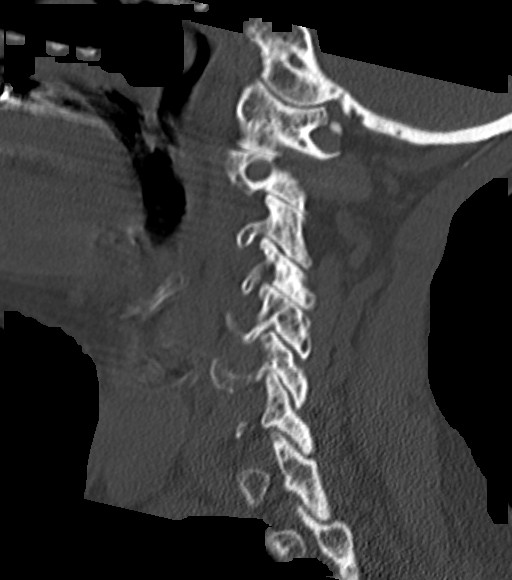

[Series 8: coronal bone 2.0 · coronal · 0.27mm/px · 3 of 50 slices shown]
[im 10/50  bone]
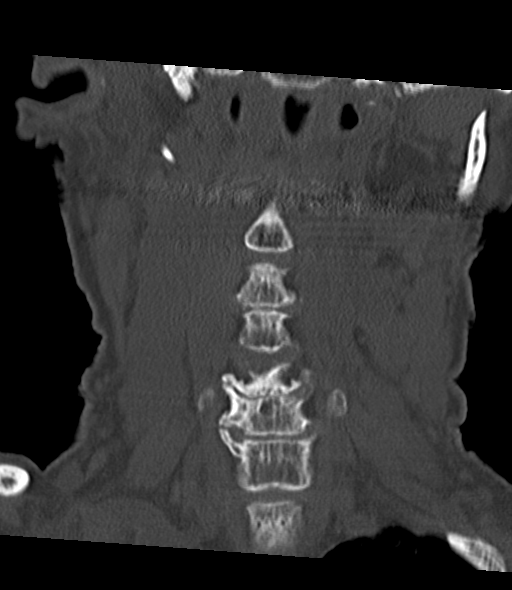
[im 20/50  bone]
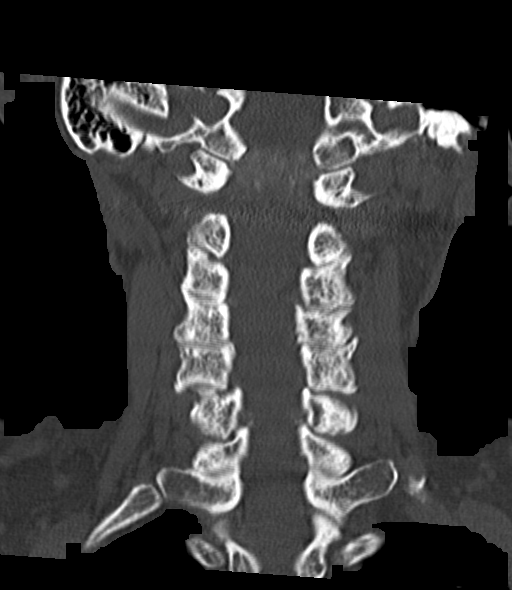
[im 30/50  bone]
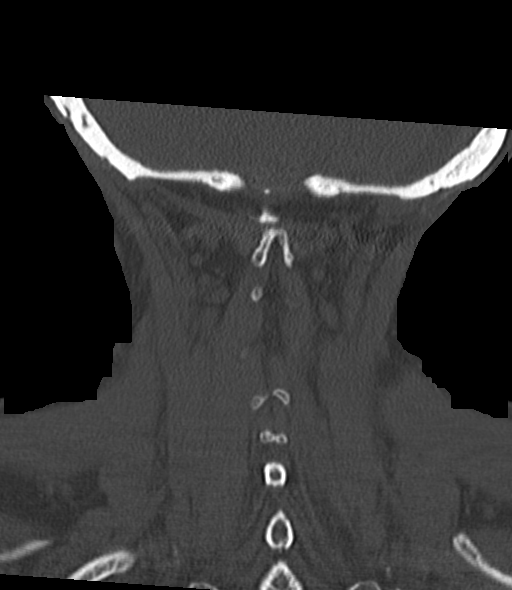

[Series 9: axial bone 2.0 · axial · 0.17mm/px · z∈[+119,+162]mm · 2 of 68 slices shown]
[im 23/68  bone]
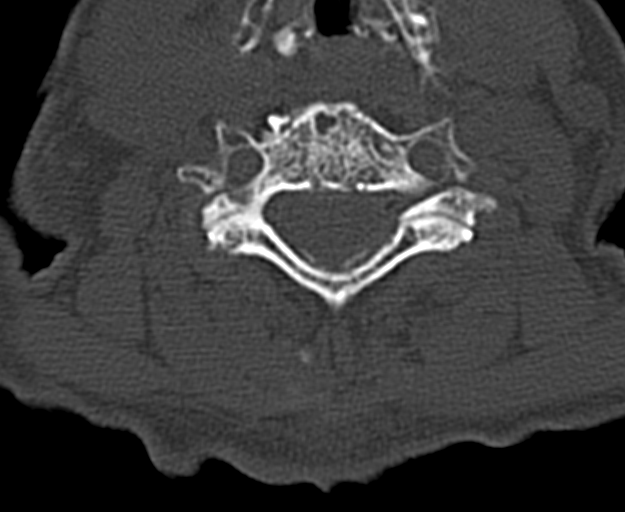
[im 45/68  bone]
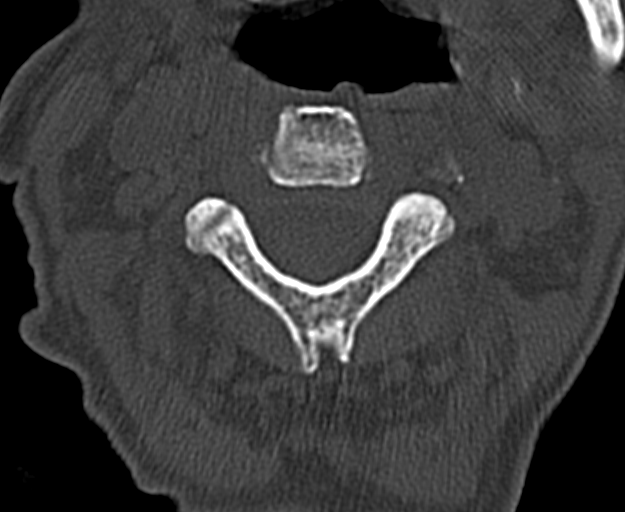

[14 of 33 positions shown; findings below may reference images not displayed]

FINDINGS: There is mild cerebral atrophy.  Diffuse low density in
the periventricular and subcortical white matter appears chronic.
No evidence for acute hemorrhage, mass lesion, midline shift,
hydrocephalus or large infarct.  No acute bony abnormality.
IMPRESSION: No acute intracranial abnormality.

Atrophy and evidence for chronic small vessel ischemic changes.

CT CERVICAL SPINE
FINDINGS: The right mandibular condyle appears to be anterior
subluxed or dislocated.  There is multilevel cervical spondylosis
without acute bony abnormality.  No evidence for an apical
pneumothorax.  There is no significant soft tissue swelling in the
neck.  There is marked disc space narrowing at C5-C6.  Mild
anterolisthesis at C5-C6.  The alignment of the cervical spine is
unchanged.
IMPRESSION: Cervical spondylosis, particularly at C5-C6.  No acute bony
abnormality.

The right mandibular condyle is anteriorly subluxed.

## 2013-05-22 IMAGING — CR DG CHEST 1V
1 series · 1 of 1 positions shown · non-contrast
Comparison: 11/21/2009

CLINICAL DATA: Fall

CHEST - 1 VIEW

[view not recorded]
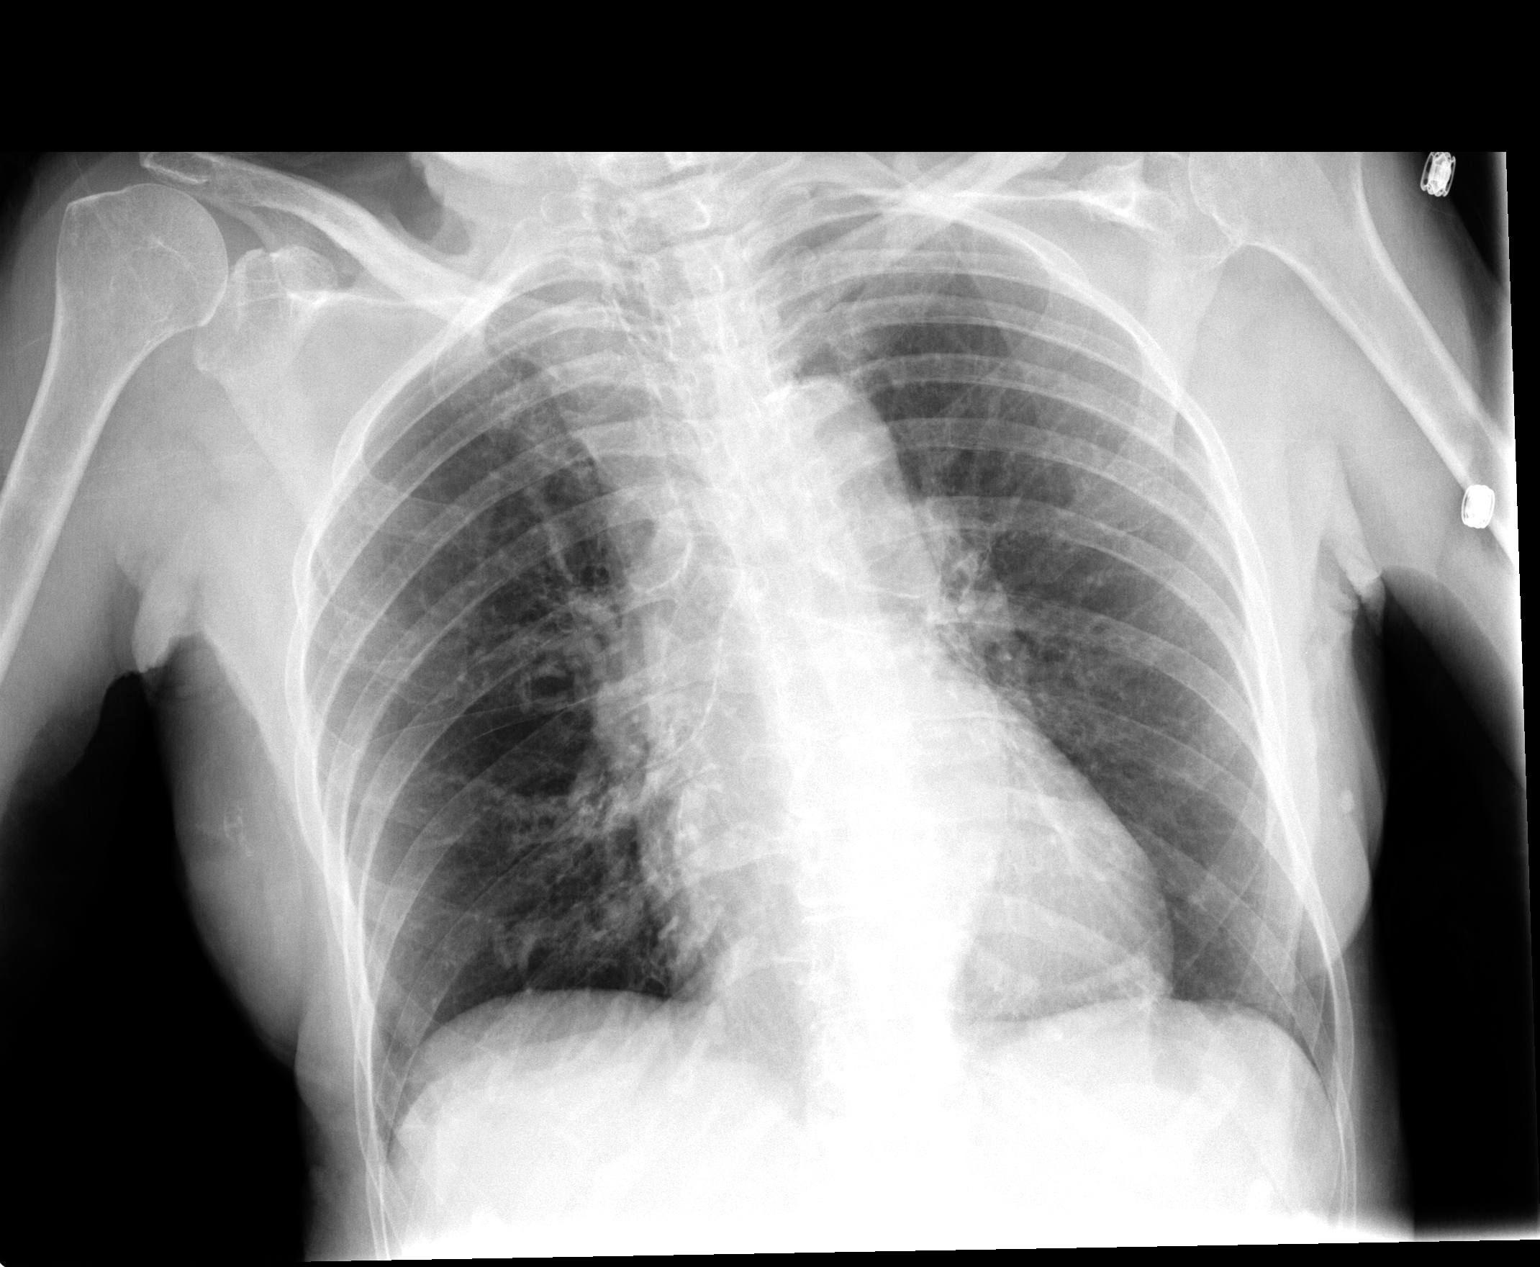

[1 of 1 positions shown; findings below may reference images not displayed]

FINDINGS: Cardiomediastinal silhouette is stable.  No acute
infiltrate or pleural effusion.  No pulmonary edema.  Bony thorax
is unremarkable.
IMPRESSION: No active disease.  No significant change.

## 2013-05-23 ENCOUNTER — Encounter (HOSPITAL_BASED_OUTPATIENT_CLINIC_OR_DEPARTMENT_OTHER): Payer: Medicare Other

## 2013-05-23 VITALS — BP 144/74 | HR 88 | Temp 97.1°F | Resp 16

## 2013-05-23 DIAGNOSIS — D649 Anemia, unspecified: Secondary | ICD-10-CM

## 2013-05-23 DIAGNOSIS — D500 Iron deficiency anemia secondary to blood loss (chronic): Secondary | ICD-10-CM

## 2013-05-23 DIAGNOSIS — D509 Iron deficiency anemia, unspecified: Secondary | ICD-10-CM | POA: Diagnosis not present

## 2013-05-23 MED ORDER — SODIUM CHLORIDE 0.9 % IJ SOLN
10.0000 mL | INTRAMUSCULAR | Status: AC | PRN
  Administered 2013-05-23: 10 mL
  Filled 2013-05-23: qty 10

## 2013-05-23 MED ORDER — SODIUM CHLORIDE 0.9 % IV SOLN
1020.0000 mg | Freq: Once | INTRAVENOUS | Status: DC
  Filled 2013-05-23: qty 34

## 2013-05-23 MED ORDER — SODIUM CHLORIDE 0.9 % IV SOLN
250.0000 mL | Freq: Once | INTRAVENOUS | Status: AC
  Administered 2013-05-23: 250 mL via INTRAVENOUS

## 2013-05-23 NOTE — Progress Notes (Signed)
[facility] [phone]   [phone]  OFFICE PROGRESS NOTE  Kara Maudlin, MD [address] [place] [address]  DIAGNOSIS: Iron deficiency anemia  INTERVAL HISTORY:   Kara Santos 79 y.o. female Who returns to the clinic today for Follow up of iron deficiency anemia requiring intravenous iron. 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  He last her clinic visits was on 02/21/2013, that time patient had in the discharged from the hospital following a hip fracture. Iron studies at that time confirmed adequate stores and her hemoglobin was improved from prior value so patient did not receive any specific Iron treatment .  She had a CBC last week which confirmed severe anemia (Hb 6.4) additionally iron studies also showed iron deficiency with a ferritin of 6.  She has been experiencing symptoms of anemia and was scheduled to receive 2 units of packed blood cells today.  She states that except for feeling fatigued and some shortness of breath on exertion she has no other symptoms and overall feels well. She denies any melena or hematochezia or hematemesis.    She is here to with her Daughter Kara Santos   MEDICAL HISTORY: Past Medical History  Diagnosis Date  . Anemia 11/07/2011    chronic IDA requiring multiple transfusions  . Diabetes mellitus   . Hypothyroidism     s/p thyroid surgery for thyroid cancer  . GERD (gastroesophageal reflux disease)   . Iron deficiency anemia 05/05/2012  . Depression   . Closed fracture of greater trochanter of femur 02/2013    left    ALLERGIES:  has No Known Allergies.  MEDICATIONS: Reviewed.  SURGICAL HISTORY:  Past Surgical History  Procedure Laterality Date  . Esophagogastroduodenoscopy  08/2006    Dr. Ronni Santos [facility], large duodenal diverticulum  . Colonoscopy  08/2006    Dr. Lolita Santos sided diverticulosis  . Givens small  bowel capsule  08/2006    few gastric erosions, numerous small bowel AVMs with SB erosions  . Double balloon enteroscopy  06/1008    [facility]-->APC of 12 SB AVMs in jejunum. 6 feet of jejunum inspected  . Right shoulder surgery    . Cholecystectomy    . Appendectomy    . Bilateral cataract extractions    . Breast reduction surgery    . Thyroid surgery      thyroid cancer     REVIEW OF SYSTEMS: 14 point review of system is as in the history above otherwise negative.[name] Kitchen  PHYSICAL EXAMINATION:   Temp Readings from Last 3 Encounters:  05/23/13 97.4 F (36.3 C) Oral  02/21/13 97.6 F (36.4 C) Oral  02/08/13 97.7 F (36.5 C) Oral   BP Readings from Last 3 Encounters:  05/23/13 129/72  02/21/13 123/66  02/08/13 147/70   Pulse Readings from Last 3 Encounters:  05/23/13 73  02/21/13 81  02/08/13 89   GENERAL: Frail elderly woman ,no acute distress. Undergoing blood transmission SKIN:  No rashes or significant lesions . No ecchymosis or petechial rash. HEAD: Normocephalic, No masses, lesions, tenderness or abnormalities  EYES: Conjunctiva  pale and  no jaundice LUNGS: Clear to auscultation , no crackles or wheezes HEART: regular rate & rhythm, no murmurs, no gallops, S1 normal and S2 normal and no S3. ABDOMEN: Abdomen soft, non-tender, no masses or organomegaly and no hepatosplenomegaly palpable MSK: No CVA tenderness and no tenderness on percussion of  the back or rib cage. EXTREMITIES: 1+ bilateral leg edema, no skin discoloration or tenderness      LABORATORY DATA: Lab Results  Component Value Date   WBC 6.8 05/20/2013   HGB 6.4* 05/20/2013   HCT 22.0* 05/20/2013   MCV 72.4* 05/20/2013   PLT 173 05/20/2013      Chemistry      Component Value Date/Time   NA 135 02/21/2013 1337   K 3.7 02/21/2013 1337   CL 97 02/21/2013 1337   CO2 27 02/21/2013 1337   BUN 16 02/21/2013 1337   CREATININE 0.58 02/21/2013 1337      Component Value Date/Time   CALCIUM 8.8 02/21/2013 1337    ALKPHOS 71 02/06/2013 1328   AST 33 02/06/2013 1328   ALT 17 02/06/2013 1328   BILITOT 0.4 02/06/2013 1328       ASSESSMENT:  Symptomatic Iron deficiency anemia believed  to be secondary to AV-malformations.  Given that patient has developed severe symptomatic anemia between May 2014 and now, I think that monthly iron studies with the intent of giving iron if indicated would be reasonable.  PLAN:  1. Blood transfusion today as ordered. 2. She'll return to clinic tomorrow for iron infusion. 3. Return to clinic in 4 weeks for repeat iron studies in the clinic visit     All questions were satisfactorily answered. Patient knows to call if  any concern arises.  I spent more than 50 % counseling the patient face to face. The total time spent in the appointment was 30 minutes.   Kara Hammers, MD FACP. Hematology/Oncology.

## 2013-05-23 NOTE — Progress Notes (Signed)
Tolerated blood transfusion without s/s adverse reaction. 

## 2013-05-24 ENCOUNTER — Encounter (HOSPITAL_BASED_OUTPATIENT_CLINIC_OR_DEPARTMENT_OTHER): Payer: Medicare Other

## 2013-05-24 VITALS — BP 138/56 | HR 78 | Temp 98.1°F | Resp 16

## 2013-05-24 DIAGNOSIS — D509 Iron deficiency anemia, unspecified: Secondary | ICD-10-CM

## 2013-05-24 LAB — TYPE AND SCREEN
ABO/RH(D): B POS
Antibody Screen: NEGATIVE
Donor AG Type: NEGATIVE
Unit division: 0

## 2013-05-24 MED ORDER — SODIUM CHLORIDE 0.9 % IV SOLN
INTRAVENOUS | Status: DC
  Administered 2013-05-24: 15:00:00 via INTRAVENOUS

## 2013-05-24 MED ORDER — SODIUM CHLORIDE 0.9 % IV SOLN
1020.0000 mg | Freq: Once | INTRAVENOUS | Status: AC
  Administered 2013-05-24: 1020 mg via INTRAVENOUS
  Filled 2013-05-24: qty 34

## 2013-05-24 MED ORDER — SODIUM CHLORIDE 0.9 % IJ SOLN
10.0000 mL | INTRAMUSCULAR | Status: DC | PRN
  Administered 2013-05-24: 10 mL via INTRAVENOUS
  Filled 2013-05-24: qty 10

## 2013-06-21 ENCOUNTER — Encounter (HOSPITAL_COMMUNITY): Payer: Medicare Other | Attending: Internal Medicine

## 2013-06-21 DIAGNOSIS — D509 Iron deficiency anemia, unspecified: Secondary | ICD-10-CM | POA: Diagnosis not present

## 2013-06-21 DIAGNOSIS — K922 Gastrointestinal hemorrhage, unspecified: Secondary | ICD-10-CM | POA: Diagnosis not present

## 2013-06-21 DIAGNOSIS — R21 Rash and other nonspecific skin eruption: Secondary | ICD-10-CM | POA: Insufficient documentation

## 2013-06-21 DIAGNOSIS — D649 Anemia, unspecified: Secondary | ICD-10-CM | POA: Diagnosis not present

## 2013-06-21 DIAGNOSIS — D500 Iron deficiency anemia secondary to blood loss (chronic): Secondary | ICD-10-CM | POA: Diagnosis not present

## 2013-06-21 LAB — CBC WITH DIFFERENTIAL/PLATELET
Eosinophils Absolute: 0.1 10*3/uL (ref 0.0–0.7)
Eosinophils Relative: 2 % (ref 0–5)
Hemoglobin: 10.1 g/dL — ABNORMAL LOW (ref 12.0–15.0)
Lymphocytes Relative: 22 % (ref 12–46)
Lymphs Abs: 1.5 10*3/uL (ref 0.7–4.0)
MCH: 26 pg (ref 26.0–34.0)
MCV: 79.9 fL (ref 78.0–100.0)
Monocytes Relative: 6 % (ref 3–12)
Platelets: 134 10*3/uL — ABNORMAL LOW (ref 150–400)
RBC: 3.88 MIL/uL (ref 3.87–5.11)
WBC: 6.7 10*3/uL (ref 4.0–10.5)

## 2013-06-21 NOTE — Progress Notes (Signed)
Labs drawn today for cbc/diff,ferr,Iron and IBC 

## 2013-06-22 ENCOUNTER — Encounter (HOSPITAL_COMMUNITY): Payer: Self-pay | Admitting: Oncology

## 2013-06-22 ENCOUNTER — Encounter (HOSPITAL_BASED_OUTPATIENT_CLINIC_OR_DEPARTMENT_OTHER): Payer: Medicare Other | Admitting: Oncology

## 2013-06-22 VITALS — BP 123/74 | HR 46 | Temp 97.7°F | Resp 18 | Wt 95.1 lb

## 2013-06-22 DIAGNOSIS — R21 Rash and other nonspecific skin eruption: Secondary | ICD-10-CM | POA: Diagnosis not present

## 2013-06-22 DIAGNOSIS — D509 Iron deficiency anemia, unspecified: Secondary | ICD-10-CM

## 2013-06-22 MED ORDER — CLOTRIMAZOLE-BETAMETHASONE 1-0.05 % EX CREA: TOPICAL_CREAM | Freq: Two times a day (BID) | CUTANEOUS | Status: DC

## 2013-06-22 NOTE — Patient Instructions (Addendum)
[facility] Discharge Instructions  RECOMMENDATIONS MADE BY THE CONSULTANT AND ANY TEST RESULTS WILL BE SENT TO YOUR REFERRING PHYSICIAN.  EXAM FINDINGS BY THE PHYSICIAN TODAY AND SIGNS OR SYMPTOMS TO REPORT TO CLINIC OR PRIMARY PHYSICIAN: Exam and findings as discussed by Dellis Anes, PA-C.  Report increased fatigue or shortness of breath.  MEDICATIONS PRESCRIBED:  Lotrisone cream - apply to area on chin as directed.  INSTRUCTIONS/FOLLOW-UP: Lab work every 4 weeks and to be seen in follow-up in 3 months.  Thank you for choosing [facility] to provide your oncology and hematology care.  To afford each patient quality time with our providers, please arrive at least 15 minutes before your scheduled appointment time.  With your help, our goal is to use those 15 minutes to complete the necessary work-up to ensure our physicians have the information they need to help with your evaluation and healthcare recommendations.    Effective January 1st, 2014, we ask that you re-schedule your appointment with our physicians should you arrive 10 or more minutes late for your appointment.  We strive to give you quality time with our providers, and arriving late affects you and other patients whose appointments are after yours.    Again, thank you for choosing [facility].  Our hope is that these requests will decrease the amount of time that you wait before being seen by our physicians.       _____________________________________________________________  Should you have questions after your visit to [facility], please contact our office at [phone] between the hours of 8:30 a.m. and 5:00 p.m.  Voicemails left after 4:30 p.m. will not be returned until the following business day.  For prescription refill requests, have your pharmacy contact our office with your prescription refill request.

## 2013-06-22 NOTE — Progress Notes (Signed)
[name], MD [address] [place] [address]  Iron deficiency anemia  CURRENT THERAPY: S/P Feraheme 1020 mg on 05/24/2013  INTERVAL HISTORY: NOOR WITTE 79 y.o. female returns for  regular  visit for followup of iron deficiency anemia requiring intravenous iron. 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  Last month, the patient was noted to be severely anemic with Hgb of 6.4 g/dL which she is symptomatic from.  She was given 2 units of PRBCs and Feraheme 1020 mg IV for a ferritin of 6.   I personally reviewed and went over laboratory results with the patient.   Labs most recently show a Hgb of 10.0 g/dL with a platelet count of 134,000 and WBC of 6.7.  Her ferritin has increased to 259 with a drop in TIBC to 283, normalization of serum iron and %saturation.   She reports that she feels much improved today compared to last month.    She reports a low-grade headache that comes and goes.  She reports that she takes Tylenol for the headache and this alleviates the discomfort.  She is welcome to continue with this therapy.   She notes a rash on her chin that arose about three weeks ago.  Two weeks ago, she went to [facility] and they recommended a "salve" which has cortisone in it according to the patient.  She reports that it has improved, but has not resolved. She denies any new make-up or detergents.  She denies any changes with facial products.   Hematologically, she denies any complaints and ROS questioning is negative.   Past Medical History  Diagnosis Date  . Anemia 11/07/2011    chronic IDA requiring multiple transfusions  . Diabetes mellitus   . Hypothyroidism     s/p thyroid surgery for thyroid cancer  . GERD (gastroesophageal reflux disease)   . Iron deficiency anemia 05/05/2012  . Depression   . Closed fracture of greater trochanter of femur 02/2013    left    has Anemia; Acute  blood loss anemia; Chronic gastrointestinal bleeding; Diabetes mellitus type 2 in nonobese; Hypothyroidism; History of thyroid cancer; Iron deficiency anemia; Closed fracture of greater trochanter of femur; History of fall; and Hypokalemia on her problem list.     has No Known Allergies.  Ms. Waner does not currently have medications on file.  Past Surgical History  Procedure Laterality Date  . Esophagogastroduodenoscopy  08/2006    Dr. Ronni Rumble [facility], large duodenal diverticulum  . Colonoscopy  08/2006    Dr. Lolita Patella sided diverticulosis  . Givens small bowel capsule  08/2006    few gastric erosions, numerous small bowel AVMs with SB erosions  . Double balloon enteroscopy  06/1008    [facility]-->APC of 12 SB AVMs in jejunum. 6 feet of jejunum inspected  . Right shoulder surgery    . Cholecystectomy    . Appendectomy    . Bilateral cataract extractions    . Breast reduction surgery    . Thyroid surgery      thyroid cancer    Denies any headaches, dizziness, double vision, fevers, chills, night sweats, nausea, vomiting, diarrhea, constipation, chest pain, heart palpitations, shortness of breath, blood in stool, black tarry stool, urinary pain, urinary burning, urinary frequency, hematuria.   PHYSICAL EXAMINATION  ECOG PERFORMANCE STATUS: 2 - Symptomatic, <50% confined to bed  Filed Vitals:   06/22/13 1029  BP: 123/74  Pulse: 46  Temp: 97.7 F (36.5 C)  Resp: 18    GENERAL:alert, no distress, well nourished, well developed, comfortable, cooperative and smiling SKIN: skin color, texture, turgor are normal, no significant lesions.  She does display a mildly erythematous rash that is papular in nature with some hyperkeratosis. HEAD: Normocephalic, No masses, lesions, tenderness or abnormalities EYES: normal, PERRLA, EOMI, Conjunctiva are pink and non-injected EARS: External ears normal OROPHARYNX:mucous membranes are moist  NECK: supple, no adenopathy, thyroid normal size,  non-tender, without nodularity, no stridor, non-tender, trachea midline LYMPH:  no palpable lymphadenopathy BREAST:not examined LUNGS: clear to auscultation and percussion HEART: regular rate & rhythm, no murmurs, no gallops, S1 normal and S2 normal ABDOMEN:abdomen soft, non-tender and normal bowel sounds BACK: Back symmetric, no curvature., No CVA tenderness EXTREMITIES:less then 2 second capillary refill, no joint deformities, effusion, or inflammation, no skin discoloration  NEURO: alert & oriented x 3 with fluent speech, no focal motor/sensory deficits   LABORATORY DATA: CBC    Component Value Date/Time   WBC 6.7 06/21/2013 1029   RBC 3.88 06/21/2013 1029   RBC 3.96 11/21/2009 2230   HGB 10.1* 06/21/2013 1029   HCT 31.0* 06/21/2013 1029   PLT 134* 06/21/2013 1029   MCV 79.9 06/21/2013 1029   MCH 26.0 06/21/2013 1029   MCHC 32.6 06/21/2013 1029   RDW 25.2* 06/21/2013 1029   LYMPHSABS 1.5 06/21/2013 1029   MONOABS 0.4 06/21/2013 1029   EOSABS 0.1 06/21/2013 1029   BASOSABS 0.0 06/21/2013 1029    Lab Results  Component Value Date   IRON 95 06/21/2013   TIBC 283 06/21/2013   FERRITIN 259 06/21/2013      ASSESSMENT:  1. Iron deficiency anemia requiring intravenous iron. 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 2. Rash on chin.  Patient Active Problem List   Diagnosis Date Noted  . Closed fracture of greater trochanter of femur 02/06/2013  . History of fall 02/06/2013  . Hypokalemia 02/06/2013  . Iron deficiency anemia 05/05/2012  . Acute blood loss anemia 03/04/2012  . Chronic gastrointestinal bleeding 03/04/2012  . Diabetes mellitus type 2 in nonobese 03/04/2012  . Hypothyroidism 03/04/2012  . History of thyroid cancer 03/04/2012  . Anemia 11/07/2011     PLAN:  1. I personally reviewed and went over laboratory results with the patient. 2. Labs in 4 weeks, 8 weeks, and 12 weeks: CBC diff, Iron/TIBC, Ferritin 3. Rx  for Lotrisone escribed to [facility]. 4. Will administer IV Feraheme per lab results.  5. Return in 3 months   THERAPY PLAN:  We will continue to monitor her labs for iron deficiency anemia and provide her an IV infusion per lab results.   All questions were answered. The patient knows to call the clinic with any problems, questions or concerns. We can certainly see the patient much sooner if necessary.  Patient and plan discussed with Dr. Alla German and he is in agreement with the aforementioned.   KEFALAS,THOMAS

## 2013-07-07 DIAGNOSIS — Z23 Encounter for immunization: Secondary | ICD-10-CM | POA: Diagnosis not present

## 2013-07-20 ENCOUNTER — Other Ambulatory Visit (HOSPITAL_COMMUNITY): Payer: Self-pay | Admitting: Pulmonary Disease

## 2013-07-20 ENCOUNTER — Encounter (HOSPITAL_COMMUNITY): Payer: Medicare Other | Attending: Internal Medicine

## 2013-07-20 DIAGNOSIS — D509 Iron deficiency anemia, unspecified: Secondary | ICD-10-CM

## 2013-07-20 DIAGNOSIS — R42 Dizziness and giddiness: Secondary | ICD-10-CM

## 2013-07-20 DIAGNOSIS — M549 Dorsalgia, unspecified: Secondary | ICD-10-CM

## 2013-07-20 DIAGNOSIS — D649 Anemia, unspecified: Secondary | ICD-10-CM | POA: Diagnosis not present

## 2013-07-20 DIAGNOSIS — E039 Hypothyroidism, unspecified: Secondary | ICD-10-CM | POA: Diagnosis not present

## 2013-07-20 LAB — CBC WITH DIFFERENTIAL/PLATELET
Basophils Absolute: 0 10*3/uL (ref 0.0–0.1)
Eosinophils Absolute: 0.1 10*3/uL (ref 0.0–0.7)
Eosinophils Relative: 1 % (ref 0–5)
Lymphs Abs: 1.2 10*3/uL (ref 0.7–4.0)
MCH: 29.8 pg (ref 26.0–34.0)
MCV: 88.6 fL (ref 78.0–100.0)
Monocytes Absolute: 0.5 10*3/uL (ref 0.1–1.0)
Platelets: 156 10*3/uL (ref 150–400)
RDW: 23 % — ABNORMAL HIGH (ref 11.5–15.5)

## 2013-07-20 LAB — IRON AND TIBC: Iron: 57 ug/dL (ref 42–135)

## 2013-07-20 LAB — FERRITIN: Ferritin: 78 ng/mL (ref 10–291)

## 2013-07-20 NOTE — Progress Notes (Signed)
Labs drawn today for cbc/diff,Iron and IBC,ferr 

## 2013-07-22 ENCOUNTER — Ambulatory Visit (HOSPITAL_COMMUNITY)
Admission: RE | Admit: 2013-07-22 | Discharge: 2013-07-22 | Disposition: A | Discharge status: Home / Self Care | Payer: Medicare Other | Source: Ambulatory Visit | Attending: Pulmonary Disease | Admitting: Pulmonary Disease

## 2013-07-22 DIAGNOSIS — G319 Degenerative disease of nervous system, unspecified: Secondary | ICD-10-CM | POA: Diagnosis not present

## 2013-07-22 DIAGNOSIS — Z9181 History of falling: Secondary | ICD-10-CM | POA: Diagnosis not present

## 2013-07-22 DIAGNOSIS — M549 Dorsalgia, unspecified: Secondary | ICD-10-CM | POA: Diagnosis not present

## 2013-07-22 DIAGNOSIS — M5137 Other intervertebral disc degeneration, lumbosacral region: Secondary | ICD-10-CM | POA: Insufficient documentation

## 2013-07-22 DIAGNOSIS — R5381 Other malaise: Secondary | ICD-10-CM | POA: Insufficient documentation

## 2013-07-22 DIAGNOSIS — M546 Pain in thoracic spine: Secondary | ICD-10-CM | POA: Diagnosis not present

## 2013-07-22 DIAGNOSIS — R42 Dizziness and giddiness: Secondary | ICD-10-CM | POA: Insufficient documentation

## 2013-07-22 DIAGNOSIS — E119 Type 2 diabetes mellitus without complications: Secondary | ICD-10-CM | POA: Diagnosis not present

## 2013-07-22 DIAGNOSIS — S0990XA Unspecified injury of head, initial encounter: Secondary | ICD-10-CM | POA: Diagnosis not present

## 2013-07-22 DIAGNOSIS — M51379 Other intervertebral disc degeneration, lumbosacral region without mention of lumbar back pain or lower extremity pain: Secondary | ICD-10-CM | POA: Insufficient documentation

## 2013-07-22 DIAGNOSIS — IMO0002 Reserved for concepts with insufficient information to code with codable children: Secondary | ICD-10-CM | POA: Diagnosis not present

## 2013-08-17 ENCOUNTER — Encounter (HOSPITAL_COMMUNITY): Payer: Medicare Other | Attending: Internal Medicine

## 2013-08-17 DIAGNOSIS — D509 Iron deficiency anemia, unspecified: Secondary | ICD-10-CM | POA: Diagnosis not present

## 2013-08-17 LAB — CBC WITH DIFFERENTIAL/PLATELET
Basophils Absolute: 0.1 10*3/uL (ref 0.0–0.1)
Basophils Relative: 1 % (ref 0–1)
Lymphocytes Relative: 16 % (ref 12–46)
MCHC: 33.4 g/dL (ref 30.0–36.0)
Monocytes Absolute: 0.6 10*3/uL (ref 0.1–1.0)
Neutro Abs: 5.4 10*3/uL (ref 1.7–7.7)
Neutrophils Relative %: 72 % (ref 43–77)
RBC: 3.57 MIL/uL — ABNORMAL LOW (ref 3.87–5.11)
RDW: 15.9 % — ABNORMAL HIGH (ref 11.5–15.5)

## 2013-08-17 LAB — IRON AND TIBC
Iron: 55 ug/dL (ref 42–135)
Saturation Ratios: 20 % (ref 20–55)
TIBC: 281 ug/dL (ref 250–470)

## 2013-08-17 LAB — FERRITIN: Ferritin: 29 ng/mL (ref 10–291)

## 2013-08-17 NOTE — Progress Notes (Signed)
Labs drawn today for cbc/diff,Iron and IBC,ferr 

## 2013-08-18 ENCOUNTER — Other Ambulatory Visit (HOSPITAL_COMMUNITY): Payer: Self-pay | Admitting: Oncology

## 2013-08-18 DIAGNOSIS — Z85850 Personal history of malignant neoplasm of thyroid: Secondary | ICD-10-CM

## 2013-08-18 DIAGNOSIS — D509 Iron deficiency anemia, unspecified: Secondary | ICD-10-CM

## 2013-08-18 MED ORDER — SODIUM CHLORIDE 0.9 % IV SOLN: 1020.0000 mg | Freq: Once | INTRAVENOUS | Status: DC

## 2013-08-19 ENCOUNTER — Encounter (HOSPITAL_BASED_OUTPATIENT_CLINIC_OR_DEPARTMENT_OTHER): Payer: Medicare Other

## 2013-08-19 VITALS — BP 125/62 | HR 85 | Temp 97.0°F | Resp 18

## 2013-08-19 DIAGNOSIS — D509 Iron deficiency anemia, unspecified: Secondary | ICD-10-CM

## 2013-08-19 MED ORDER — SODIUM CHLORIDE 0.9 % IV SOLN
1020.0000 mg | Freq: Once | INTRAVENOUS | Status: AC
  Administered 2013-08-19: 1020 mg via INTRAVENOUS
  Filled 2013-08-19: qty 34

## 2013-08-19 NOTE — Progress Notes (Signed)
Tolerated fereme 1020 mg iv very well.  Home accompanied by daughter.

## 2013-09-14 ENCOUNTER — Encounter (HOSPITAL_COMMUNITY): Payer: Medicare Other | Attending: Internal Medicine

## 2013-09-14 DIAGNOSIS — D509 Iron deficiency anemia, unspecified: Secondary | ICD-10-CM | POA: Insufficient documentation

## 2013-09-14 LAB — CBC WITH DIFFERENTIAL/PLATELET
Basophils Absolute: 0 10*3/uL (ref 0.0–0.1)
Basophils Relative: 1 % (ref 0–1)
Eosinophils Absolute: 0.1 10*3/uL (ref 0.0–0.7)
Eosinophils Relative: 2 % (ref 0–5)
MCH: 32.1 pg (ref 26.0–34.0)
MCHC: 34.6 g/dL (ref 30.0–36.0)
Neutro Abs: 4.5 10*3/uL (ref 1.7–7.7)
Neutrophils Relative %: 72 % (ref 43–77)
Platelets: 138 10*3/uL — ABNORMAL LOW (ref 150–400)
RDW: 15.9 % — ABNORMAL HIGH (ref 11.5–15.5)

## 2013-09-14 LAB — IRON AND TIBC
Iron: 77 ug/dL (ref 42–135)
Saturation Ratios: 34 % (ref 20–55)
TIBC: 228 ug/dL — ABNORMAL LOW (ref 250–470)

## 2013-09-14 NOTE — Progress Notes (Signed)
Labs drawn today for cbc/diff,Iron and IBC,ferr 

## 2013-09-15 NOTE — Progress Notes (Signed)
Patient has exam 09/16/2013

## 2013-09-15 NOTE — Progress Notes (Signed)
[name], MD [address] [place] [address]  Iron deficiency anemia  Acute blood loss anemia  CURRENT THERAPY:S/P Feraheme 1020 mg on 08/19/2013  INTERVAL HISTORY: Kara Santos 79 y.o. female returns for  regular  visit for followup of iron deficiency anemia requiring intravenous iron. 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  I have reviewed the patient's flowsheet regarding IV Feraheme requirements x 12 months.     Oncology Flowsheet 11/07/2011 03/04/2012 05/07/2012 07/29/2012  ferumoxytol (FERAHEME) IV   1,020 mg 1,020 mg   Oncology Flowsheet 10/27/2012 12/30/2012 02/06/2013 05/24/2013  ferumoxytol [facility]) IV 1,020 mg 1,020 mg  1,020 mg   Oncology Flowsheet 08/19/2013  ferumoxytol [facility]) IV 1,020 mg   She has required IV Fereheme about every 3 months.  I think it reasonable to set Maecy up for IV Feraheme supportive therapy every 3 months.  That seems to be her trend and of course we can monitor her ferritin over time and make sure she is not showing evidence of iron overload.  I personally reviewed and went over laboratory results with the patient.  Kara Santos spent thirty minutes today praising her husband to me.  He sounds like he was a wonderful man and a great husband to Kara Santos.  She got tearful at times during discussion.  I did note her cyclical fashion to the conversation.    Hematologically, she denies any complaints and ROS questioning is negative.    Past Medical History  Diagnosis Date  . Anemia 11/07/2011    chronic IDA requiring multiple transfusions  . Diabetes mellitus   . Hypothyroidism     s/p thyroid surgery for thyroid cancer  . GERD (gastroesophageal reflux disease)   . Iron deficiency anemia 05/05/2012  . Depression   . Closed fracture of greater trochanter of femur 02/2013    left    has Anemia; Acute blood loss anemia; Chronic gastrointestinal bleeding;  Diabetes mellitus type 2 in nonobese; Hypothyroidism; History of thyroid cancer; Iron deficiency anemia; Closed fracture of greater trochanter of femur; History of fall; and Hypokalemia on her problem list.     has No Known Allergies.  Kara Santos does not currently have medications on file.  Past Surgical History  Procedure Laterality Date  . Esophagogastroduodenoscopy  08/2006    Dr. Ronni Rumble [facility], large duodenal diverticulum  . Colonoscopy  08/2006    Dr. Lolita Patella sided diverticulosis  . Givens small bowel capsule  08/2006    few gastric erosions, numerous small bowel AVMs with SB erosions  . Double balloon enteroscopy  06/1008    [facility]-->APC of 12 SB AVMs in jejunum. 6 feet of jejunum inspected  . Right shoulder surgery    . Cholecystectomy    . Appendectomy    . Bilateral cataract extractions    . Breast reduction surgery    . Thyroid surgery      thyroid cancer    Denies any headaches, dizziness, double vision, fevers, chills, night sweats, nausea, vomiting, diarrhea, constipation, chest pain, heart palpitations, shortness of breath, blood in stool, black tarry stool, urinary pain, urinary burning, urinary frequency, hematuria.   PHYSICAL EXAMINATION  ECOG PERFORMANCE STATUS: 2 - Symptomatic, <50% confined to bed  Filed Vitals:   09/16/13 1433  BP: 110/70  Pulse: 75  Temp: 97.6 F (36.4 C)  Resp: 16    GENERAL:alert, no distress, well nourished, well developed, comfortable, cooperative and smiling  SKIN: skin color, texture, turgor are normal, no rashes or significant lesions HEAD: Normocephalic, No masses, lesions, tenderness or abnormalities EYES: normal, PERRLA, EOMI, Conjunctiva are pink and non-injected EARS: External ears normal OROPHARYNX:mucous membranes are moist  NECK: supple, trachea midline LYMPH:  not examined BREAST:not examined LUNGS: not examined HEART: not examined ABDOMEN:not examined BACK: not examined EXTREMITIES:less then 2 second  capillary refill, no joint deformities, effusion, or inflammation, no skin discoloration  NEURO: alert & oriented x 3 with fluent speech, no focal motor/sensory deficits    LABORATORY DATA: CBC    Component Value Date/Time   WBC 6.2 09/14/2013 1024   RBC 3.58* 09/14/2013 1024   RBC 3.96 11/21/2009 2230   HGB 11.5* 09/14/2013 1024   HCT 33.2* 09/14/2013 1024   PLT 138* 09/14/2013 1024   MCV 92.7 09/14/2013 1024   MCH 32.1 09/14/2013 1024   MCHC 34.6 09/14/2013 1024   RDW 15.9* 09/14/2013 1024   LYMPHSABS 1.1 09/14/2013 1024   MONOABS 0.5 09/14/2013 1024   EOSABS 0.1 09/14/2013 1024   BASOSABS 0.0 09/14/2013 1024    Lab Results  Component Value Date   IRON 77 09/14/2013   TIBC 228* 09/14/2013   FERRITIN 289 09/14/2013     Chemistry      Component Value Date/Time   NA 135 02/21/2013 1337   K 3.7 02/21/2013 1337   CL 97 02/21/2013 1337   CO2 27 02/21/2013 1337   BUN 16 02/21/2013 1337   CREATININE 0.58 02/21/2013 1337      Component Value Date/Time   CALCIUM 8.8 02/21/2013 1337   ALKPHOS 71 02/06/2013 1328   AST 33 02/06/2013 1328   ALT 17 02/06/2013 1328   BILITOT 0.4 02/06/2013 1328        RADIOGRAPHIC STUDIES:  07/22/2013  CLINICAL DATA: Dizziness, weakness, multiple falls, diabetes  EXAM:  CT HEAD WITHOUT CONTRAST  TECHNIQUE:  Contiguous axial images were obtained from the base of the skull  through the vertex without intravenous contrast.  COMPARISON: 02/06/2013  FINDINGS:  Generalized atrophy.  Normal ventricular morphology.  No midline shift or mass effect.  Small vessel chronic ischemic changes of deep cerebral white matter.  No intracranial hemorrhage, mass lesion, or acute infarction.  Visualized paranasal sinuses and mastoid air cells clear.  Bones unremarkable.  Atherosclerotic calcifications at skullbase.  IMPRESSION:  Atrophy with small vessel chronic ischemic changes of deep cerebral  white matter.  No acute intracranial abnormalities    Electronically Signed  By: Ulyses Southward M.D.  On: 07/22/2013 15:38   07/22/2013 CLINICAL DATA: Larey Seat 4 days ago. Back pain.  EXAM:  THORACIC SPINE - 2 VIEW + SWIMMERS  COMPARISON: 11/21/2009 chest x-ray.  FINDINGS:  Cervical spondylotic changes incompletely assessed on present exam  most notable C5-6 level.  No thoracic compression fracture is noted. There are degenerative  changes throughout the thoracic spine.  Prominent degenerative changes upper lumbar spine.  If there were progressive symptoms and further delineation were  clinically desired then MR may be considered.  Calcified mildly tortuous aorta.  IMPRESSION:  Cervical spondylotic changes incompletely assessed on present exam  most notable C5-6 level.  No thoracic compression fracture is noted. There are degenerative  changes throughout the thoracic spine.  Prominent degenerative changes upper lumbar spine.  Electronically Signed  By: Bridgett Larsson M.D.  On: 07/22/2013 15:56   07/22/2013  CLINICAL DATA: Status post fall 4 days ago. Back pain.  EXAM:  LUMBAR SPINE - COMPLETE 4+ VIEW  COMPARISON: MR lumbar  spine 12/10/2003.  FINDINGS:  There is marked convex right scoliosis. No fracture is identified.  Severe loss of disc space height is seen at L1-2 and L2-3 where  there is endplate spurring. Facet degenerative change lower lumbar  spine is noted.  IMPRESSION:  No acute finding.  Severe appearing degenerative disc disease L1-2 and L2-3.  Convex right scoliosis.  Electronically Signed  By: Drusilla Kanner M.D.  On: 07/22/2013 17:19     ASSESSMENT:  1. Iron deficiency anemia requiring intravenous iron. 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  Patient Active Problem List   Diagnosis Date Noted  . Closed fracture of greater trochanter of femur 02/06/2013  . History of fall 02/06/2013  . Hypokalemia 02/06/2013  . Iron deficiency anemia 05/05/2012   . Acute blood loss anemia 03/04/2012  . Chronic gastrointestinal bleeding 03/04/2012  . Diabetes mellitus type 2 in nonobese 03/04/2012  . Hypothyroidism 03/04/2012  . History of thyroid cancer 03/04/2012  . Anemia 11/07/2011     PLAN:  1. I personally reviewed and went over laboratory results with the patient. 2. I personally reviewed and went over radiographic studies with the patient. 3. Development of IV Feraheme supportive therapy plan. 4. IV Feraheme 1020 mg every 3 months 5. Labs every 3 months: CBC diff, ferritin 6. Return in 3 months for IV Feraheme and then every 3 months 7. Recommend OTC Loratadine 10 mg daily. 8. Follow-up with PCP as directed.  9. Return in 3 months for follow-up.   THERAPY PLAN:  I have reviewed the patient's IV Feraheme requirement over the past 12 months.  She has been receiving IV Fereheme every 3 months and therefore I will develop a supportive therapy plan to allow for IV Fereheme every 3 months.   All questions were answered. The patient knows to call the clinic with any problems, questions or concerns. We can certainly see the patient much sooner if necessary.  Patient and plan discussed with Dr. Alla German and he is in agreement with the aforementioned.   More than 50% of the time spent with the patient was utilized for counseling and coordination of care.  I spent 35 minutes counseling the patient face to face. The total time spent in the appointment was 40 minutes.  Ahlani Wickes

## 2013-09-16 ENCOUNTER — Encounter (HOSPITAL_COMMUNITY): Payer: Self-pay | Admitting: Oncology

## 2013-09-16 ENCOUNTER — Encounter (HOSPITAL_BASED_OUTPATIENT_CLINIC_OR_DEPARTMENT_OTHER): Payer: Medicare Other | Admitting: Oncology

## 2013-09-16 VITALS — BP 110/70 | HR 75 | Temp 97.6°F | Resp 16 | Wt 100.4 lb

## 2013-09-16 DIAGNOSIS — D509 Iron deficiency anemia, unspecified: Secondary | ICD-10-CM

## 2013-09-16 DIAGNOSIS — D500 Iron deficiency anemia secondary to blood loss (chronic): Secondary | ICD-10-CM | POA: Diagnosis not present

## 2013-09-16 DIAGNOSIS — Q2733 Arteriovenous malformation of digestive system vessel: Secondary | ICD-10-CM

## 2013-09-16 DIAGNOSIS — D62 Acute posthemorrhagic anemia: Secondary | ICD-10-CM

## 2013-09-16 NOTE — Patient Instructions (Signed)
[facility] Discharge Instructions  RECOMMENDATIONS MADE BY THE CONSULTANT AND ANY TEST RESULTS WILL BE SENT TO YOUR REFERRING PHYSICIAN.  EXAM FINDINGS BY THE PHYSICIAN TODAY AND SIGNS OR SYMPTOMS TO REPORT TO CLINIC OR PRIMARY PHYSICIAN: Exam and findings as discussed by Dellis Anes, PA-C.  You are doing well.  Lab results are great.  Plan to check labs and give feraheme every 3 months.  MEDICATIONS PRESCRIBED:  none  INSTRUCTIONS/FOLLOW-UP: Follow-up with lab work, office visit and feraheme in 3 months.  Thank you for choosing [facility] to provide your oncology and hematology care.  To afford each patient quality time with our providers, please arrive at least 15 minutes before your scheduled appointment time.  With your help, our goal is to use those 15 minutes to complete the necessary work-up to ensure our physicians have the information they need to help with your evaluation and healthcare recommendations.    Effective January 1st, 2014, we ask that you re-schedule your appointment with our physicians should you arrive 10 or more minutes late for your appointment.  We strive to give you quality time with our providers, and arriving late affects you and other patients whose appointments are after yours.    Again, thank you for choosing [facility].  Our hope is that these requests will decrease the amount of time that you wait before being seen by our physicians.       _____________________________________________________________  Should you have questions after your visit to [facility], please contact our office at [phone] between the hours of 8:30 a.m. and 5:00 p.m.  Voicemails left after 4:30 p.m. will not be returned until the following business day.  For prescription refill requests, have your pharmacy contact our office with your prescription refill request.

## 2013-10-06 DIAGNOSIS — G300 Alzheimer's disease with early onset: Secondary | ICD-10-CM

## 2013-10-06 DIAGNOSIS — F0280 Dementia in other diseases classified elsewhere without behavioral disturbance: Secondary | ICD-10-CM

## 2013-10-06 HISTORY — DX: Alzheimer's disease with early onset: G30.0

## 2013-10-06 HISTORY — DX: Dementia in other diseases classified elsewhere, unspecified severity, without behavioral disturbance, psychotic disturbance, mood disturbance, and anxiety: F02.80

## 2013-10-20 DIAGNOSIS — D649 Anemia, unspecified: Secondary | ICD-10-CM | POA: Diagnosis not present

## 2013-10-20 DIAGNOSIS — K2100 Gastro-esophageal reflux disease with esophagitis, without bleeding: Secondary | ICD-10-CM | POA: Diagnosis not present

## 2013-10-20 DIAGNOSIS — E109 Type 1 diabetes mellitus without complications: Secondary | ICD-10-CM | POA: Diagnosis not present

## 2013-10-20 DIAGNOSIS — I10 Essential (primary) hypertension: Secondary | ICD-10-CM | POA: Diagnosis not present

## 2013-10-21 DIAGNOSIS — M1990 Unspecified osteoarthritis, unspecified site: Secondary | ICD-10-CM | POA: Diagnosis not present

## 2013-10-21 DIAGNOSIS — D649 Anemia, unspecified: Secondary | ICD-10-CM | POA: Diagnosis not present

## 2013-10-21 DIAGNOSIS — I10 Essential (primary) hypertension: Secondary | ICD-10-CM | POA: Diagnosis not present

## 2013-10-21 DIAGNOSIS — E039 Hypothyroidism, unspecified: Secondary | ICD-10-CM | POA: Diagnosis not present

## 2013-10-21 DIAGNOSIS — E109 Type 1 diabetes mellitus without complications: Secondary | ICD-10-CM | POA: Diagnosis not present

## 2013-10-21 DIAGNOSIS — K2100 Gastro-esophageal reflux disease with esophagitis, without bleeding: Secondary | ICD-10-CM | POA: Diagnosis not present

## 2013-11-28 DIAGNOSIS — F0390 Unspecified dementia without behavioral disturbance: Secondary | ICD-10-CM | POA: Diagnosis not present

## 2013-11-28 DIAGNOSIS — D649 Anemia, unspecified: Secondary | ICD-10-CM | POA: Diagnosis not present

## 2013-11-28 DIAGNOSIS — I10 Essential (primary) hypertension: Secondary | ICD-10-CM | POA: Diagnosis not present

## 2013-11-28 DIAGNOSIS — E109 Type 1 diabetes mellitus without complications: Secondary | ICD-10-CM | POA: Diagnosis not present

## 2013-11-29 ENCOUNTER — Other Ambulatory Visit (HOSPITAL_COMMUNITY): Payer: Self-pay | Admitting: Oncology

## 2013-11-29 DIAGNOSIS — F329 Major depressive disorder, single episode, unspecified: Secondary | ICD-10-CM

## 2013-11-29 DIAGNOSIS — F32A Depression, unspecified: Secondary | ICD-10-CM

## 2013-11-29 MED ORDER — ESCITALOPRAM OXALATE 10 MG PO TABS: 10.0000 mg | ORAL_TABLET | Freq: Every day | ORAL | Status: DC

## 2013-12-01 ENCOUNTER — Other Ambulatory Visit (HOSPITAL_COMMUNITY): Payer: Self-pay

## 2013-12-01 DIAGNOSIS — E119 Type 2 diabetes mellitus without complications: Secondary | ICD-10-CM

## 2013-12-01 DIAGNOSIS — D649 Anemia, unspecified: Secondary | ICD-10-CM

## 2013-12-01 DIAGNOSIS — D509 Iron deficiency anemia, unspecified: Secondary | ICD-10-CM

## 2013-12-04 ENCOUNTER — Encounter (HOSPITAL_COMMUNITY): Payer: Self-pay | Admitting: Oncology

## 2013-12-04 DIAGNOSIS — K31811 Angiodysplasia of stomach and duodenum with bleeding: Secondary | ICD-10-CM

## 2013-12-04 HISTORY — DX: Angiodysplasia of stomach and duodenum with bleeding: K31.811

## 2013-12-04 NOTE — Progress Notes (Signed)
Kara Bogus, MD [address] [place] [address]  Iron deficiency anemia  Acute blood loss anemia  AVM (arteriovenous malformation) of stomach, acquired with hemorrhage  CURRENT THERAPY: S/P Feraheme 1020 mg on 08/19/2013   INTERVAL HISTORY: Kara Santos 80 y.o. female returns for  regular  visit for followup of iron deficiency anemia requiring intravenous iron. 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   I personally reviewed and went over laboratory results with the patient.  The results are noted within this dictation.  Per our last encounter on 09/15/2013: She has required IV Madilyn Hook about every 3 months. I think it reasonable to set Kara Santos up for IV Feraheme supportive therapy every 3 months. That seems to be her trend and of course we can monitor her ferritin over time and make sure she is not showing evidence of iron overload.  Therefore, she is set-up for IV feraheme today and every 3 months.  Supportive therapy plan is reviewed with the patient.  We will monitor for iron overload, but she is definitely a patient who requires continuous IV iron infusions due to her AVMs.   She recently saw Dr. Luan Pulling, her primary care physician, who diagnosed her with Alzheimer's/dementia. He started her on Aricept 5 mg at bedtime. I suspect we'll increase the dose over the next few weeks. There are a lot of questions about Alzheimer's dementia, legal issues, power of attorney, construction of a will, etc. I've encouraged her to seek out on maternity for advice regarding legal matters. I provided her information regarding a second opinion with neurology if they so desire. If educated both the patient and her daughter, Kara Santos, that I am not sure there is much more a neurologist can offer given the patient's age and Alzheimer status. I suspect that the patient may be a candidate for the addition of Namenda in the  future but this is out of the realm of hematology and therefore I will defer to primary care and neurology if necessary.  Kara Santos on her own accord brought up discussion regarding living at home alone or becoming a resident at a nursing home. She admits that she would not be happy at a nursing home. She does note that she is "driving her daughter crazy."  I expressed my concerns about her living at home alone. Concerned about her claims about falling at home. I explained that she certainly would be safer and a nursing home. I've encouraged her to continue to have this conversation with her daughter Kara Santos and discuss this with Dr. Luan Pulling and get his opinion regarding her safety at home alone. I think in the patient's best interest she would benefit greatly from nursing home placement.  Kara Santos is pleasantly confused. She is not agitated or irritation. She is seen smiling.    She is agreeable to IV Feraheme today in order to maintain her hemoglobin level. Her ferritin was drawn today and is pending. We'll continue with supportive therapy plan that was developed on her last encounter with IV Feraheme every 3 months with a ferritin level in an attempt to maintain her hemoglobin as best as possible.  As her Alzheimer progresses, we may have to readdress her supportive therapy plan.  From a hematologic standpoint, the patient denies any complaints and ROS questioning is negative.  Past Medical History  Diagnosis Date  . Anemia 11/07/2011    chronic IDA requiring multiple transfusions  .  Diabetes mellitus   . Hypothyroidism     s/p thyroid surgery for thyroid cancer  . GERD (gastroesophageal reflux disease)   . Iron deficiency anemia 05/05/2012  . Depression   . Closed fracture of greater trochanter of femur 02/2013    left  . AVM (arteriovenous malformation) of stomach, acquired with hemorrhage 12/04/2013  . Alzheimer's disease, early onset 35    has Acute blood loss anemia; Chronic gastrointestinal  bleeding; Diabetes mellitus type 2 in nonobese; Hypothyroidism; History of thyroid cancer; Iron deficiency anemia; Closed fracture of greater trochanter of femur; History of fall; and AVM (arteriovenous malformation) of stomach, acquired with hemorrhage on her problem list.     has No Known Allergies.  Ms. Neyland does not currently have medications on file.  Past Surgical History  Procedure Laterality Date  . Esophagogastroduodenoscopy  08/2006    Dr. Trevor Iha [facility], large duodenal diverticulum  . Colonoscopy  08/2006    Dr. Cecelia Byars sided diverticulosis  . Givens small bowel capsule  08/2006    few gastric erosions, numerous small bowel AVMs with SB erosions  . Double balloon enteroscopy  06/1008    [facility]-->APC of 12 SB AVMs in jejunum. 6 feet of jejunum inspected  . Right shoulder surgery    . Cholecystectomy    . Appendectomy    . Bilateral cataract extractions    . Breast reduction surgery    . Thyroid surgery      thyroid cancer    Denies any headaches, dizziness, double vision, fevers, chills, night sweats, nausea, vomiting, diarrhea, constipation, chest pain, heart palpitations, shortness of breath, blood in stool, black tarry stool, urinary pain, urinary burning, urinary frequency, hematuria.   PHYSICAL EXAMINATION  ECOG PERFORMANCE STATUS: 1 - Symptomatic but completely ambulatory  Filed Vitals:   12/05/13 1200  BP: 128/80  Pulse: 80  Temp: 97.3 F (36.3 C)  Resp: 18    GENERAL:no distress, well developed, comfortable, cooperative, smiling and pleasantly confused SKIN: skin color, texture, turgor are normal, no rashes or significant lesions HEAD: Normocephalic, No masses, lesions, tenderness or abnormalities EYES: normal, PERRLA, EOMI, Conjunctiva are pink and non-injected EARS: External ears normal OROPHARYNX:mucous membranes are moist  NECK: supple, trachea midline LYMPH:  not examined BREAST:not examined LUNGS: not examined HEART: not  examined ABDOMEN:not examined BACK: Back symmetric, no curvature. EXTREMITIES:less then 2 second capillary refill, no skin discoloration, no clubbing, no cyanosis  NEURO: no focal motor/sensory deficits, positive findings: pleasantly confused with cyclical nature of discussion    LABORATORY DATA: CBC    Component Value Date/Time   WBC 9.1 12/05/2013 1150   RBC 3.61* 12/05/2013 1150   RBC 3.96 11/21/2009 2230   HGB 11.1* 12/05/2013 1150   HCT 33.0* 12/05/2013 1150   PLT 204 12/05/2013 1150   MCV 91.4 12/05/2013 1150   MCH 30.7 12/05/2013 1150   MCHC 33.6 12/05/2013 1150   RDW 14.6 12/05/2013 1150   LYMPHSABS 0.9 12/05/2013 1150   MONOABS 0.4 12/05/2013 1150   EOSABS 0.0 12/05/2013 1150   BASOSABS 0.0 12/05/2013 1150    Lab Results  Component Value Date   FERRITIN 289 09/14/2013     ASSESSMENT:  1. Iron deficiency anemia requiring intravenous iron. 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 2. Alzheimer's dementia, pleasantly confused.  On Aricept by Dr. Luan Pulling  Patient Active Problem List   Diagnosis Date Noted  . AVM (arteriovenous malformation) of stomach, acquired with hemorrhage 12/04/2013  .  Closed fracture of greater trochanter of femur 02/06/2013  . History of fall 02/06/2013  . Iron deficiency anemia 05/05/2012  . Acute blood loss anemia 03/04/2012  . Chronic gastrointestinal bleeding 03/04/2012  . Diabetes mellitus type 2 in nonobese 03/04/2012  . Hypothyroidism 03/04/2012  . History of thyroid cancer 03/04/2012     PLAN:  1. I personally reviewed and went over laboratory results with the patient.  The results are noted within this dictation. 2. Labs every 3 months: CBC, ferritin (lab orders in supportive therapy plan) 3. Supportive therapy plan reviewed 4. IV Feraheme every 3 months per supportive therapy plan 5. Continue follow-up with PCP regarding dementia 6. Information provided regarding Dr. Jannifer Franklin (neurology) in  [place].  I would be happy to make referral if interested, but this was not expressed to me today.  I was honest with regards to Neurology's ability to provide more input than what is presently being done by Dr. Luan Pulling.  Dr. Luan Pulling is certainly able to care for the patient, but if they desire more information or a second opinion, I would recommend Dr. Jannifer Franklin. 7. Return in 6 months for follow-up   THERAPY PLAN:  From a hematologic standpoint, we have developed a supportive therapy plan consisting of IV Feraheme every 3 months with labs every 3 months.  Her trend over the past 12 months demonstrates a need for IV Feraheme every 3 months.  This can be adjusted according to her ferritin level.  Her dementia is progressing and has progressed significantly since our last encounter December. She is to followup with primary care as scheduled.  All questions were answered. The patient knows to call the clinic with any problems, questions or concerns. We can certainly see the patient much sooner if necessary.  Patient and plan discussed with Dr. Farrel Gobble and he is in agreement with the aforementioned.   I spent 30 minutes counseling the patient face to face. The total time spent in the appointment was 45 minutes.  Kara Santos

## 2013-12-05 ENCOUNTER — Encounter (HOSPITAL_BASED_OUTPATIENT_CLINIC_OR_DEPARTMENT_OTHER): Payer: Medicare Other

## 2013-12-05 ENCOUNTER — Encounter (HOSPITAL_COMMUNITY): Payer: Self-pay | Admitting: Oncology

## 2013-12-05 ENCOUNTER — Encounter (HOSPITAL_COMMUNITY): Payer: Medicare Other | Attending: Internal Medicine | Admitting: Oncology

## 2013-12-05 VITALS — BP 128/80 | HR 80 | Temp 97.3°F | Resp 18 | Wt 92.7 lb

## 2013-12-05 DIAGNOSIS — D509 Iron deficiency anemia, unspecified: Secondary | ICD-10-CM

## 2013-12-05 DIAGNOSIS — K31811 Angiodysplasia of stomach and duodenum with bleeding: Secondary | ICD-10-CM

## 2013-12-05 DIAGNOSIS — D649 Anemia, unspecified: Secondary | ICD-10-CM

## 2013-12-05 DIAGNOSIS — E119 Type 2 diabetes mellitus without complications: Secondary | ICD-10-CM | POA: Insufficient documentation

## 2013-12-05 DIAGNOSIS — D62 Acute posthemorrhagic anemia: Secondary | ICD-10-CM

## 2013-12-05 LAB — COMPREHENSIVE METABOLIC PANEL
ALK PHOS: 72 U/L (ref 39–117)
ALT: 47 U/L — AB (ref 0–35)
AST: 93 U/L — ABNORMAL HIGH (ref 0–37)
Albumin: 4.2 g/dL (ref 3.5–5.2)
BILIRUBIN TOTAL: 0.4 mg/dL (ref 0.3–1.2)
BUN: 14 mg/dL (ref 6–23)
CHLORIDE: 96 meq/L (ref 96–112)
CO2: 27 meq/L (ref 19–32)
Calcium: 8.8 mg/dL (ref 8.4–10.5)
Creatinine, Ser: 0.84 mg/dL (ref 0.50–1.10)
GFR, EST AFRICAN AMERICAN: 66 mL/min — AB (ref >90)
GFR, EST NON AFRICAN AMERICAN: 57 mL/min — AB (ref >90)
GLUCOSE: 151 mg/dL — AB (ref 70–99)
POTASSIUM: 3.6 meq/L — AB (ref 3.7–5.3)
SODIUM: 137 meq/L (ref 137–147)
Total Protein: 7.4 g/dL (ref 6.0–8.3)

## 2013-12-05 LAB — CBC WITH DIFFERENTIAL/PLATELET
Basophils Absolute: 0 10*3/uL (ref 0.0–0.1)
Basophils Relative: 0 % (ref 0–1)
Eosinophils Absolute: 0 10*3/uL (ref 0.0–0.7)
Eosinophils Relative: 0 % (ref 0–5)
HCT: 33 % — ABNORMAL LOW (ref 36.0–46.0)
Hemoglobin: 11.1 g/dL — ABNORMAL LOW (ref 12.0–15.0)
LYMPHS ABS: 0.9 10*3/uL (ref 0.7–4.0)
Lymphocytes Relative: 10 % — ABNORMAL LOW (ref 12–46)
MCH: 30.7 pg (ref 26.0–34.0)
MCHC: 33.6 g/dL (ref 30.0–36.0)
MCV: 91.4 fL (ref 78.0–100.0)
MONOS PCT: 5 % (ref 3–12)
Monocytes Absolute: 0.4 10*3/uL (ref 0.1–1.0)
NEUTROS ABS: 7.7 10*3/uL (ref 1.7–7.7)
NEUTROS PCT: 85 % — AB (ref 43–77)
Platelets: 204 10*3/uL (ref 150–400)
RBC: 3.61 MIL/uL — AB (ref 3.87–5.11)
RDW: 14.6 % (ref 11.5–15.5)
WBC: 9.1 10*3/uL (ref 4.0–10.5)

## 2013-12-05 LAB — VITAMIN B12: VITAMIN B 12: 486 pg/mL (ref 211–911)

## 2013-12-05 LAB — FERRITIN: FERRITIN: 59 ng/mL (ref 10–291)

## 2013-12-05 MED ORDER — SODIUM CHLORIDE 0.9 % IV SOLN
1020.0000 mg | Freq: Once | INTRAVENOUS | Status: AC
  Administered 2013-12-05: 1020 mg via INTRAVENOUS
  Filled 2013-12-05: qty 34

## 2013-12-05 MED ORDER — SODIUM CHLORIDE 0.9 % IV SOLN
Freq: Once | INTRAVENOUS | Status: AC
  Administered 2013-12-05: 13:00:00 via INTRAVENOUS

## 2013-12-05 NOTE — Patient Instructions (Signed)
[facility] Discharge Instructions  RECOMMENDATIONS MADE BY THE CONSULTANT AND ANY TEST RESULTS WILL BE SENT TO YOUR REFERRING PHYSICIAN.  EXAM FINDINGS BY THE PHYSICIAN TODAY AND SIGNS OR SYMPTOMS TO REPORT TO CLINIC OR PRIMARY PHYSICIAN: Exam and findings as discussed by [name], PA-C.  Continue with follow-up with Dr. Luan Pulling regarding your new medication.  We will continue with labs and feraheme every 3 months.  Report increased fatigue or shortness of breath.  MEDICATIONS PRESCRIBED:  none  INSTRUCTIONS/FOLLOW-UP: Follow-up with blood work and feraheme every 3 months and to be seen in follow-up in 6 months.  Thank you for choosing [facility] to provide your oncology and hematology care.  To afford each patient quality time with our providers, please arrive at least 15 minutes before your scheduled appointment time.  With your help, our goal is to use those 15 minutes to complete the necessary work-up to ensure our physicians have the information they need to help with your evaluation and healthcare recommendations.    Effective January 1st, 2014, we ask that you re-schedule your appointment with our physicians should you arrive 10 or more minutes late for your appointment.  We strive to give you quality time with our providers, and arriving late affects you and other patients whose appointments are after yours.    Again, thank you for choosing [facility]  Our hope is that these requests will decrease the amount of time that you wait before being seen by our physicians.       _____________________________________________________________  Should you have questions after your visit to [facility], please contact our office at [phone] between the hours of 8:30 a.m. and 5:00 p.m.  Voicemails left after 4:30 p.m. will not be returned until the following business day.  For prescription refill requests, have your pharmacy  contact our office with your prescription refill request.

## 2013-12-05 NOTE — Progress Notes (Signed)
Tolerated feraheme infusion well.  Alert, in no apparent distress. Left in c/o family for transport home.

## 2013-12-05 NOTE — Progress Notes (Signed)
Kara Santos presented for labwork. Labs per MD order drawn via Peripheral Line 23 gauge needle inserted in right AC.  Good blood return present. Procedure without incident.  Needle removed intact. Patient tolerated procedure well.

## 2013-12-26 DIAGNOSIS — H52209 Unspecified astigmatism, unspecified eye: Secondary | ICD-10-CM | POA: Diagnosis not present

## 2013-12-26 DIAGNOSIS — E119 Type 2 diabetes mellitus without complications: Secondary | ICD-10-CM | POA: Diagnosis not present

## 2013-12-26 DIAGNOSIS — Z961 Presence of intraocular lens: Secondary | ICD-10-CM | POA: Diagnosis not present

## 2013-12-26 DIAGNOSIS — H04129 Dry eye syndrome of unspecified lacrimal gland: Secondary | ICD-10-CM | POA: Diagnosis not present

## 2014-01-19 DIAGNOSIS — E109 Type 1 diabetes mellitus without complications: Secondary | ICD-10-CM | POA: Diagnosis not present

## 2014-01-19 DIAGNOSIS — D649 Anemia, unspecified: Secondary | ICD-10-CM | POA: Diagnosis not present

## 2014-01-19 DIAGNOSIS — F0390 Unspecified dementia without behavioral disturbance: Secondary | ICD-10-CM | POA: Diagnosis not present

## 2014-01-19 DIAGNOSIS — E039 Hypothyroidism, unspecified: Secondary | ICD-10-CM | POA: Diagnosis not present

## 2014-02-28 ENCOUNTER — Encounter (HOSPITAL_COMMUNITY): Payer: Medicare Other | Attending: Internal Medicine

## 2014-02-28 ENCOUNTER — Encounter (HOSPITAL_BASED_OUTPATIENT_CLINIC_OR_DEPARTMENT_OTHER): Payer: Medicare Other

## 2014-02-28 VITALS — BP 134/66 | HR 79 | Temp 97.4°F | Resp 20

## 2014-02-28 DIAGNOSIS — E119 Type 2 diabetes mellitus without complications: Secondary | ICD-10-CM

## 2014-02-28 DIAGNOSIS — D509 Iron deficiency anemia, unspecified: Secondary | ICD-10-CM

## 2014-02-28 DIAGNOSIS — D649 Anemia, unspecified: Secondary | ICD-10-CM | POA: Insufficient documentation

## 2014-02-28 LAB — CBC WITH DIFFERENTIAL/PLATELET
Basophils Absolute: 0 10*3/uL (ref 0.0–0.1)
Basophils Relative: 0 % (ref 0–1)
EOS ABS: 0.1 10*3/uL (ref 0.0–0.7)
Eosinophils Relative: 1 % (ref 0–5)
HCT: 26.9 % — ABNORMAL LOW (ref 36.0–46.0)
Hemoglobin: 8.7 g/dL — ABNORMAL LOW (ref 12.0–15.0)
Lymphocytes Relative: 18 % (ref 12–46)
Lymphs Abs: 1.4 10*3/uL (ref 0.7–4.0)
MCH: 30 pg (ref 26.0–34.0)
MCHC: 32.3 g/dL (ref 30.0–36.0)
MCV: 92.8 fL (ref 78.0–100.0)
Monocytes Absolute: 0.7 10*3/uL (ref 0.1–1.0)
Monocytes Relative: 9 % (ref 3–12)
NEUTROS ABS: 5.6 10*3/uL (ref 1.7–7.7)
NEUTROS PCT: 72 % (ref 43–77)
PLATELETS: 167 10*3/uL (ref 150–400)
RBC: 2.9 MIL/uL — ABNORMAL LOW (ref 3.87–5.11)
RDW: 15.4 % (ref 11.5–15.5)
WBC: 7.8 10*3/uL (ref 4.0–10.5)

## 2014-02-28 LAB — FERRITIN: Ferritin: 40 ng/mL (ref 10–291)

## 2014-02-28 MED ORDER — SODIUM CHLORIDE 0.9 % IV SOLN
1020.0000 mg | Freq: Once | INTRAVENOUS | Status: AC
  Administered 2014-02-28: 1020 mg via INTRAVENOUS
  Filled 2014-02-28: qty 34

## 2014-02-28 MED ORDER — SODIUM CHLORIDE 0.9 % IV SOLN
Freq: Once | INTRAVENOUS | Status: AC
  Administered 2014-02-28: 12:00:00 via INTRAVENOUS

## 2014-02-28 NOTE — Progress Notes (Signed)
A&Ox4; in no distress. Tolerated feraheme infusion well.  Left in c/o daughter for transport home.

## 2014-03-03 ENCOUNTER — Other Ambulatory Visit (HOSPITAL_COMMUNITY): Payer: Self-pay | Admitting: Oncology

## 2014-03-03 ENCOUNTER — Telehealth (HOSPITAL_COMMUNITY): Payer: Self-pay

## 2014-03-03 DIAGNOSIS — K31811 Angiodysplasia of stomach and duodenum with bleeding: Secondary | ICD-10-CM

## 2014-03-03 DIAGNOSIS — D509 Iron deficiency anemia, unspecified: Secondary | ICD-10-CM

## 2014-03-03 DIAGNOSIS — K922 Gastrointestinal hemorrhage, unspecified: Secondary | ICD-10-CM

## 2014-03-03 NOTE — Progress Notes (Signed)
Labs drawn

## 2014-03-06 ENCOUNTER — Ambulatory Visit (HOSPITAL_COMMUNITY): Payer: Medicare Other

## 2014-03-07 ENCOUNTER — Ambulatory Visit (HOSPITAL_COMMUNITY): Payer: Medicare Other

## 2014-03-22 DIAGNOSIS — L570 Actinic keratosis: Secondary | ICD-10-CM | POA: Diagnosis not present

## 2014-03-22 DIAGNOSIS — D235 Other benign neoplasm of skin of trunk: Secondary | ICD-10-CM | POA: Diagnosis not present

## 2014-03-22 DIAGNOSIS — T148XXA Other injury of unspecified body region, initial encounter: Secondary | ICD-10-CM | POA: Diagnosis not present

## 2014-03-22 DIAGNOSIS — C44319 Basal cell carcinoma of skin of other parts of face: Secondary | ICD-10-CM | POA: Diagnosis not present

## 2014-04-20 ENCOUNTER — Telehealth (HOSPITAL_COMMUNITY): Payer: Self-pay | Admitting: Emergency Medicine

## 2014-04-20 ENCOUNTER — Encounter (HOSPITAL_COMMUNITY): Payer: Medicare Other | Attending: Internal Medicine

## 2014-04-20 DIAGNOSIS — K31811 Angiodysplasia of stomach and duodenum with bleeding: Secondary | ICD-10-CM | POA: Diagnosis not present

## 2014-04-20 DIAGNOSIS — D509 Iron deficiency anemia, unspecified: Secondary | ICD-10-CM

## 2014-04-20 DIAGNOSIS — K922 Gastrointestinal hemorrhage, unspecified: Secondary | ICD-10-CM | POA: Insufficient documentation

## 2014-04-20 DIAGNOSIS — D62 Acute posthemorrhagic anemia: Secondary | ICD-10-CM | POA: Diagnosis not present

## 2014-04-20 DIAGNOSIS — D649 Anemia, unspecified: Secondary | ICD-10-CM

## 2014-04-20 DIAGNOSIS — E119 Type 2 diabetes mellitus without complications: Secondary | ICD-10-CM

## 2014-04-20 LAB — CBC WITH DIFFERENTIAL/PLATELET
BASOS ABS: 0.1 10*3/uL (ref 0.0–0.1)
BASOS PCT: 1 % (ref 0–1)
EOS PCT: 1 % (ref 0–5)
Eosinophils Absolute: 0.1 10*3/uL (ref 0.0–0.7)
HEMATOCRIT: 30.3 % — AB (ref 36.0–46.0)
Hemoglobin: 10.3 g/dL — ABNORMAL LOW (ref 12.0–15.0)
Lymphocytes Relative: 20 % (ref 12–46)
Lymphs Abs: 1.6 10*3/uL (ref 0.7–4.0)
MCH: 31.1 pg (ref 26.0–34.0)
MCHC: 34 g/dL (ref 30.0–36.0)
MCV: 91.5 fL (ref 78.0–100.0)
MONO ABS: 0.6 10*3/uL (ref 0.1–1.0)
Monocytes Relative: 8 % (ref 3–12)
Neutro Abs: 5.5 10*3/uL (ref 1.7–7.7)
Neutrophils Relative %: 70 % (ref 43–77)
Platelets: 169 10*3/uL (ref 150–400)
RBC: 3.31 MIL/uL — ABNORMAL LOW (ref 3.87–5.11)
RDW: 15.2 % (ref 11.5–15.5)
WBC: 7.9 10*3/uL (ref 4.0–10.5)

## 2014-04-20 NOTE — Telephone Encounter (Signed)
Pt presented at cancer center with increased fatique.  Lab work drawn per MD orders

## 2014-04-20 NOTE — Progress Notes (Signed)
Kara Santos's reason for visit today is for labs as scheduled per MD orders.  Venipuncture performed with a 23 gauge butterfly needle to L Antecubital.  Kara Santos tolerated procedure well and without incident; questions were answered and patient was discharged.    Kara Santos presented at the cancer center with increased fatique.  Requested lab work.  Lab work per MD orders

## 2014-04-21 ENCOUNTER — Encounter (HOSPITAL_BASED_OUTPATIENT_CLINIC_OR_DEPARTMENT_OTHER): Payer: Medicare Other

## 2014-04-21 ENCOUNTER — Telehealth (HOSPITAL_COMMUNITY): Payer: Self-pay

## 2014-04-21 ENCOUNTER — Other Ambulatory Visit (HOSPITAL_COMMUNITY): Payer: Self-pay | Admitting: Oncology

## 2014-04-21 VITALS — BP 119/55 | HR 77 | Resp 20

## 2014-04-21 DIAGNOSIS — D62 Acute posthemorrhagic anemia: Secondary | ICD-10-CM

## 2014-04-21 DIAGNOSIS — K922 Gastrointestinal hemorrhage, unspecified: Secondary | ICD-10-CM

## 2014-04-21 DIAGNOSIS — K31811 Angiodysplasia of stomach and duodenum with bleeding: Secondary | ICD-10-CM

## 2014-04-21 DIAGNOSIS — D509 Iron deficiency anemia, unspecified: Secondary | ICD-10-CM | POA: Diagnosis not present

## 2014-04-21 LAB — FERRITIN: Ferritin: 95 ng/mL (ref 10–291)

## 2014-04-21 MED ORDER — SODIUM CHLORIDE 0.9 % IV SOLN
510.0000 mg | Freq: Once | INTRAVENOUS | Status: AC
  Administered 2014-04-21: 510 mg via INTRAVENOUS
  Filled 2014-04-21: qty 17

## 2014-04-21 MED ORDER — SODIUM CHLORIDE 0.9 % IJ SOLN
10.0000 mL | Freq: Once | INTRAMUSCULAR | Status: AC
  Administered 2014-04-21: 10 mL via INTRAVENOUS

## 2014-04-21 MED ORDER — SODIUM CHLORIDE 0.9 % IV SOLN
Freq: Once | INTRAVENOUS | Status: AC
  Administered 2014-04-21: 13:00:00 via INTRAVENOUS

## 2014-04-21 NOTE — Progress Notes (Signed)
Tolerated Feraheme without any difficulty.

## 2014-04-21 NOTE — Telephone Encounter (Signed)
Per [name], PA-C, patient can come in and receive feraheme 510 mg today.  No transfusion needed.  Daughter notified and Kara Santos will come today at 1:15 pm.

## 2014-04-21 NOTE — Progress Notes (Signed)
Pt discharged home at 1404.

## 2014-05-15 DIAGNOSIS — L570 Actinic keratosis: Secondary | ICD-10-CM | POA: Diagnosis not present

## 2014-05-15 DIAGNOSIS — L259 Unspecified contact dermatitis, unspecified cause: Secondary | ICD-10-CM | POA: Diagnosis not present

## 2014-05-15 DIAGNOSIS — Z85828 Personal history of other malignant neoplasm of skin: Secondary | ICD-10-CM | POA: Diagnosis not present

## 2014-05-22 ENCOUNTER — Ambulatory Visit (HOSPITAL_COMMUNITY): Payer: Medicare Other | Admitting: Oncology

## 2014-05-23 ENCOUNTER — Ambulatory Visit (HOSPITAL_COMMUNITY): Payer: Medicare Other | Admitting: Oncology

## 2014-05-23 ENCOUNTER — Ambulatory Visit (HOSPITAL_COMMUNITY): Payer: Medicare Other

## 2014-05-23 ENCOUNTER — Encounter (HOSPITAL_COMMUNITY): Payer: Medicare Other

## 2014-05-25 ENCOUNTER — Other Ambulatory Visit (HOSPITAL_COMMUNITY): Payer: Self-pay

## 2014-05-26 ENCOUNTER — Other Ambulatory Visit (HOSPITAL_COMMUNITY): Payer: Self-pay

## 2014-05-26 DIAGNOSIS — D509 Iron deficiency anemia, unspecified: Secondary | ICD-10-CM

## 2014-05-26 DIAGNOSIS — D62 Acute posthemorrhagic anemia: Secondary | ICD-10-CM

## 2014-05-29 ENCOUNTER — Ambulatory Visit (HOSPITAL_COMMUNITY): Payer: Medicare Other

## 2014-05-30 DIAGNOSIS — D649 Anemia, unspecified: Secondary | ICD-10-CM | POA: Diagnosis not present

## 2014-05-30 DIAGNOSIS — E109 Type 1 diabetes mellitus without complications: Secondary | ICD-10-CM | POA: Diagnosis not present

## 2014-05-30 DIAGNOSIS — F0390 Unspecified dementia without behavioral disturbance: Secondary | ICD-10-CM | POA: Diagnosis not present

## 2014-05-30 DIAGNOSIS — E039 Hypothyroidism, unspecified: Secondary | ICD-10-CM | POA: Diagnosis not present

## 2014-05-30 NOTE — Progress Notes (Signed)
Kara Bogus, MD [place] [address]  Iron deficiency anemia - Plan: TSH  Chronic gastrointestinal bleeding  AVM (arteriovenous malformation) of stomach, acquired with hemorrhage  CURRENT THERAPY: IV Feraheme 510 mg day 1 and 8 every 3 months  INTERVAL HISTORY: Kara Santos 80 y.o. female returns for  regular  visit for followup of iron deficiency anemia requiring intravenous iron. 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  I personally reviewed and went over laboratory results with the patient.  The results are noted within this dictation.  There was some confusion regarding her iron regimen due to new Medicare guidelines.  Medicare will no longer pay for IV Feraheme 1020 mg, instead we need to give 510 mg on day 1 and 510 mg on day 8.  As a result, her supportive therapy plan was changed to reflect this.  In July, the patient called with complaints related to iron deficiency and her ferritin was checked.  It was below 100, and therefore 510 mg of Feraheme was given.  As a result, her July Feraheme infusion was a one time infusion between her scheduled 3 month infusions.  She denies any hematologic complaints this afternoon.     Past Medical History  Diagnosis Date  . Anemia 11/07/2011    chronic IDA requiring multiple transfusions  . Diabetes mellitus   . Hypothyroidism     s/p thyroid surgery for thyroid cancer  . GERD (gastroesophageal reflux disease)   . Iron deficiency anemia 05/05/2012  . Depression   . Closed fracture of greater trochanter of femur 02/2013    left  . AVM (arteriovenous malformation) of stomach, acquired with hemorrhage 12/04/2013  . Alzheimer's disease, early onset 20    has Chronic gastrointestinal bleeding; Diabetes mellitus type 2 in nonobese; Hypothyroidism; History of thyroid cancer; Iron deficiency anemia; Closed fracture of greater trochanter of femur;  History of fall; and AVM (arteriovenous malformation) of stomach, acquired with hemorrhage on her problem list.     is allergic to asa.  Ms. Culbreth does not currently have medications on file.  Past Surgical History  Procedure Laterality Date  . Esophagogastroduodenoscopy  08/2006    Dr. Trevor Iha [facility], large duodenal diverticulum  . Colonoscopy  08/2006    Dr. Cecelia Byars sided diverticulosis  . Givens small bowel capsule  08/2006    few gastric erosions, numerous small bowel AVMs with SB erosions  . Double balloon enteroscopy  06/1008    [facility]-->APC of 12 SB AVMs in jejunum. 6 feet of jejunum inspected  . Right shoulder surgery    . Cholecystectomy    . Appendectomy    . Bilateral cataract extractions    . Breast reduction surgery    . Thyroid surgery      thyroid cancer    Denies any headaches, dizziness, double vision, fevers, chills, night sweats, nausea, vomiting, diarrhea, constipation, chest pain, heart palpitations, shortness of breath, blood in stool, black tarry stool, urinary pain, urinary burning, urinary frequency, hematuria.   PHYSICAL EXAMINATION  ECOG PERFORMANCE STATUS: 1 - Symptomatic but completely ambulatory  Filed Vitals:   05/31/14 1200  BP: 135/61  Pulse: 80  Temp: 97.3 F (36.3 C)  Resp: 16    GENERAL:alert, no distress, well nourished, well developed, comfortable, cooperative and smiling SKIN: skin color, texture, turgor are normal, no rashes or significant lesions HEAD: Normocephalic, No masses, lesions, tenderness or abnormalities EYES: normal,  PERRLA, EOMI, Conjunctiva are pink and non-injected EARS: External ears normal OROPHARYNX:mucous membranes are moist  NECK: supple, trachea midline LYMPH:  not examined BREAST:not examined LUNGS: not examined HEART: not examined ABDOMEN:not examined BACK: Back symmetric, no curvature. EXTREMITIES:less then 2 second capillary refill, no joint deformities, effusion, or inflammation, no edema, no  skin discoloration, no clubbing, no cyanosis  NEURO: alert & oriented x 3 with fluent speech, no focal motor/sensory deficits, gait normal   LABORATORY DATA: CBC    Component Value Date/Time   WBC 7.9 04/20/2014 1510   RBC 3.31* 04/20/2014 1510   RBC 3.96 11/21/2009 2230   HGB 10.3* 04/20/2014 1510   HCT 30.3* 04/20/2014 1510   PLT 169 04/20/2014 1510   MCV 91.5 04/20/2014 1510   MCH 31.1 04/20/2014 1510   MCHC 34.0 04/20/2014 1510   RDW 15.2 04/20/2014 1510   LYMPHSABS 1.6 04/20/2014 1510   MONOABS 0.6 04/20/2014 1510   EOSABS 0.1 04/20/2014 1510   BASOSABS 0.1 04/20/2014 1510      Chemistry      Component Value Date/Time   NA 137 12/05/2013 1150   K 3.6* 12/05/2013 1150   CL 96 12/05/2013 1150   CO2 27 12/05/2013 1150   BUN 14 12/05/2013 1150   CREATININE 0.84 12/05/2013 1150      Component Value Date/Time   CALCIUM 8.8 12/05/2013 1150   ALKPHOS 72 12/05/2013 1150   AST 93* 12/05/2013 1150   ALT 47* 12/05/2013 1150   BILITOT 0.4 12/05/2013 1150     Lab Results  Component Value Date   FERRITIN 95 04/20/2014   Lab Results  Component Value Date   IRON 77 09/14/2013   TIBC 228* 09/14/2013   FERRITIN 95 04/20/2014      ASSESSMENT:  1. Iron deficiency anemia requiring intravenous iron. 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  2. Alzheimer's dementia, pleasantly confused. On Aricept by Dr. Luan Pulling  Patient Active Problem List   Diagnosis Date Noted  . AVM (arteriovenous malformation) of stomach, acquired with hemorrhage 12/04/2013  . Closed fracture of greater trochanter of femur 02/06/2013  . History of fall 02/06/2013  . Iron deficiency anemia 05/05/2012  . Chronic gastrointestinal bleeding 03/04/2012  . Diabetes mellitus type 2 in nonobese 03/04/2012  . Hypothyroidism 03/04/2012  . History of thyroid cancer 03/04/2012     PLAN:  1. I personally reviewed and went over laboratory results with the patient. The results are noted  within this dictation.  2. Labs every 3 months: CBC, ferritin (lab orders in supportive therapy plan)  3. Supportive therapy plan reviewed and re-built to reflect changes per Medicare 4. IV Feraheme every 3 months per supportive therapy plan (day 1 and 8) 5. Continue follow-up with PCP regarding dementia 6. Return in 6 months for follow-up   THERAPY PLAN:  From a hematologic standpoint, we have developed a supportive therapy plan consisting of IV Feraheme every 3 months with labs every 3 months.  This can be adjusted according to her ferritin level. Her dementia is progressing and has progressed. She is to followup with primary care as scheduled.   All questions were answered. The patient knows to call the clinic with any problems, questions or concerns. We can certainly see the patient much sooner if necessary.  Patient and plan discussed with Dr. Farrel Gobble and he is in agreement with the aforementioned.   Zamari Vea 05/31/2014

## 2014-05-31 ENCOUNTER — Encounter (HOSPITAL_COMMUNITY): Payer: Self-pay | Admitting: Oncology

## 2014-05-31 ENCOUNTER — Encounter (HOSPITAL_COMMUNITY): Payer: Medicare Other | Attending: Internal Medicine | Admitting: Oncology

## 2014-05-31 ENCOUNTER — Encounter (HOSPITAL_BASED_OUTPATIENT_CLINIC_OR_DEPARTMENT_OTHER): Payer: Medicare Other

## 2014-05-31 VITALS — BP 135/61 | HR 80 | Temp 97.3°F | Resp 16 | Wt 97.7 lb

## 2014-05-31 VITALS — BP 139/57 | HR 75 | Temp 98.0°F | Resp 16

## 2014-05-31 DIAGNOSIS — K922 Gastrointestinal hemorrhage, unspecified: Secondary | ICD-10-CM

## 2014-05-31 DIAGNOSIS — K31811 Angiodysplasia of stomach and duodenum with bleeding: Secondary | ICD-10-CM | POA: Insufficient documentation

## 2014-05-31 DIAGNOSIS — D62 Acute posthemorrhagic anemia: Secondary | ICD-10-CM | POA: Insufficient documentation

## 2014-05-31 DIAGNOSIS — D509 Iron deficiency anemia, unspecified: Secondary | ICD-10-CM | POA: Diagnosis not present

## 2014-05-31 LAB — COMPREHENSIVE METABOLIC PANEL
ALT: 25 U/L (ref 0–35)
AST: 37 U/L (ref 0–37)
Albumin: 4 g/dL (ref 3.5–5.2)
Alkaline Phosphatase: 64 U/L (ref 39–117)
Anion gap: 15 (ref 5–15)
BUN: 17 mg/dL (ref 6–23)
CO2: 25 meq/L (ref 19–32)
Calcium: 8.9 mg/dL (ref 8.4–10.5)
Chloride: 102 mEq/L (ref 96–112)
Creatinine, Ser: 0.74 mg/dL (ref 0.50–1.10)
GFR, EST AFRICAN AMERICAN: 81 mL/min — AB (ref >90)
GFR, EST NON AFRICAN AMERICAN: 70 mL/min — AB (ref >90)
Glucose, Bld: 99 mg/dL (ref 70–99)
POTASSIUM: 4.3 meq/L (ref 3.7–5.3)
SODIUM: 142 meq/L (ref 137–147)
Total Bilirubin: 0.4 mg/dL (ref 0.3–1.2)
Total Protein: 6.5 g/dL (ref 6.0–8.3)

## 2014-05-31 LAB — CBC
HEMATOCRIT: 29.7 % — AB (ref 36.0–46.0)
HEMOGLOBIN: 10.1 g/dL — AB (ref 12.0–15.0)
MCH: 31.2 pg (ref 26.0–34.0)
MCHC: 34 g/dL (ref 30.0–36.0)
MCV: 91.7 fL (ref 78.0–100.0)
Platelets: 143 10*3/uL — ABNORMAL LOW (ref 150–400)
RBC: 3.24 MIL/uL — ABNORMAL LOW (ref 3.87–5.11)
RDW: 14.1 % (ref 11.5–15.5)
WBC: 6.1 10*3/uL (ref 4.0–10.5)

## 2014-05-31 MED ORDER — SODIUM CHLORIDE 0.9 % IV SOLN
510.0000 mg | Freq: Once | INTRAVENOUS | Status: AC
  Administered 2014-05-31: 510 mg via INTRAVENOUS
  Filled 2014-05-31: qty 17

## 2014-05-31 MED ORDER — SODIUM CHLORIDE 0.9 % IV SOLN
Freq: Once | INTRAVENOUS | Status: AC
  Administered 2014-05-31: 13:00:00 via INTRAVENOUS

## 2014-05-31 NOTE — Patient Instructions (Signed)
[facility] Discharge Instructions  RECOMMENDATIONS MADE BY THE CONSULTANT AND ANY TEST RESULTS WILL BE SENT TO YOUR REFERRING PHYSICIAN.  EXAM FINDINGS BY THE PHYSICIAN TODAY AND SIGNS OR SYMPTOMS TO REPORT TO CLINIC OR PRIMARY PHYSICIAN: you seen Tom today  Follow up with Dr Luan Pulling as scheduled.  You will get iron today, and iron in one week.  3 months labs and iron transfusion again.  See tom in 6 months  Thank you for choosing [facility] to provide your oncology and hematology care.  To afford each patient quality time with our providers, please arrive at least 15 minutes before your scheduled appointment time.  With your help, our goal is to use those 15 minutes to complete the necessary work-up to ensure our physicians have the information they need to help with your evaluation and healthcare recommendations.    Effective January 1st, 2014, we ask that you re-schedule your appointment with our physicians should you arrive 10 or more minutes late for your appointment.  We strive to give you quality time with our providers, and arriving late affects you and other patients whose appointments are after yours.    Again, thank you for choosing [facility].  Our hope is that these requests will decrease the amount of time that you wait before being seen by our physicians.       _____________________________________________________________  Should you have questions after your visit to [facility], please contact our office at [phone] between the hours of 8:30 a.m. and 5:00 p.m.  Voicemails left after 4:30 p.m. will not be returned until the following business day.  For prescription refill requests, have your pharmacy contact our office with your prescription refill request.

## 2014-05-31 NOTE — Progress Notes (Signed)
Patient tolerated infusion well.

## 2014-05-31 NOTE — Progress Notes (Signed)
LABS FOR TSH,HA1C,CMP,FERR,CBCD

## 2014-06-01 LAB — HEMOGLOBIN A1C
Hgb A1c MFr Bld: 4.9 % (ref <5.7)
Mean Plasma Glucose: 94 mg/dL (ref <117)

## 2014-06-01 LAB — TSH: TSH: 9.23 u[IU]/mL — ABNORMAL HIGH (ref 0.350–4.500)

## 2014-06-01 LAB — FERRITIN: Ferritin: 82 ng/mL (ref 10–291)

## 2014-06-07 ENCOUNTER — Encounter (HOSPITAL_COMMUNITY): Payer: Medicare Other | Attending: Internal Medicine

## 2014-06-07 VITALS — BP 129/55 | HR 78 | Temp 97.5°F | Resp 16

## 2014-06-07 DIAGNOSIS — D509 Iron deficiency anemia, unspecified: Secondary | ICD-10-CM

## 2014-06-07 DIAGNOSIS — K31811 Angiodysplasia of stomach and duodenum with bleeding: Secondary | ICD-10-CM | POA: Insufficient documentation

## 2014-06-07 DIAGNOSIS — D62 Acute posthemorrhagic anemia: Secondary | ICD-10-CM | POA: Insufficient documentation

## 2014-06-07 MED ORDER — SODIUM CHLORIDE 0.9 % IJ SOLN
10.0000 mL | Freq: Once | INTRAMUSCULAR | Status: AC
  Administered 2014-06-07: 10 mL via INTRAVENOUS

## 2014-06-07 MED ORDER — SODIUM CHLORIDE 0.9 % IV SOLN
Freq: Once | INTRAVENOUS | Status: AC
  Administered 2014-06-07: 12:00:00 via INTRAVENOUS

## 2014-06-07 MED ORDER — SODIUM CHLORIDE 0.9 % IV SOLN
510.0000 mg | Freq: Once | INTRAVENOUS | Status: AC
  Administered 2014-06-07: 510 mg via INTRAVENOUS
  Filled 2014-06-07: qty 17

## 2014-06-07 NOTE — Patient Instructions (Signed)
[place] Discharge Instructions  RECOMMENDATIONS MADE BY THE CONSULTANT AND ANY TEST RESULTS WILL BE SENT TO YOUR REFERRING PHYSICIAN.  MEDICATIONS PRESCRIBED:  Feraheme infusion today.  INSTRUCTIONS/FOLLOW-UP: Return to clinic as scheduled. Report any issues/concerns as needed to clinic prior to appointments.  Thank you for choosing [facility] to provide your oncology and hematology care.  To afford each patient quality time with our providers, please arrive at least 15 minutes before your scheduled appointment time.  With your help, our goal is to use those 15 minutes to complete the necessary work-up to ensure our physicians have the information they need to help with your evaluation and healthcare recommendations.    Effective January 1st, 2014, we ask that you re-schedule your appointment with our physicians should you arrive 10 or more minutes late for your appointment.  We strive to give you quality time with our providers, and arriving late affects you and other patients whose appointments are after yours.    Again, thank you for choosing [facility].  Our hope is that these requests will decrease the amount of time that you wait before being seen by our physicians.       _____________________________________________________________  Should you have questions after your visit to [facility], please contact our office at [phone] between the hours of 8:30 a.m. and 4:30 p.m.  Voicemails left after 4:30 p.m. will not be returned until the following business day.  For prescription refill requests, have your pharmacy contact our office with your prescription refill request.    _______________________________________________________________  We hope that we have given you very good care.  You may receive a patient satisfaction survey in the mail, please complete it and return it as soon as possible.  We value your  feedback!  _______________________________________________________________  Have you asked about our STAR program?  STAR stands for Survivorship Training and Rehabilitation, and this is a nationally recognized cancer care program that focuses on survivorship and rehabilitation.  Cancer and cancer treatments may cause problems, such as, pain, making you feel tired and keeping you from doing the things that you need or want to do. Cancer rehabilitation can help. Our goal is to reduce these troubling effects and help you have the best quality of life possible.  You may receive a survey from a nurse that asks questions about your current state of health.  Based on the survey results, all eligible patients will be referred to the [facility] program for an evaluation so we can better serve you!  A frequently asked questions sheet is available upon request.

## 2014-06-07 NOTE — Progress Notes (Signed)
Tolerated well

## 2014-06-15 DIAGNOSIS — Z23 Encounter for immunization: Secondary | ICD-10-CM | POA: Diagnosis not present

## 2014-06-21 DIAGNOSIS — H6120 Impacted cerumen, unspecified ear: Secondary | ICD-10-CM | POA: Diagnosis not present

## 2014-09-06 ENCOUNTER — Ambulatory Visit (HOSPITAL_COMMUNITY): Payer: Medicare Other

## 2014-09-06 ENCOUNTER — Other Ambulatory Visit (HOSPITAL_COMMUNITY): Payer: Medicare Other

## 2014-09-13 ENCOUNTER — Encounter (HOSPITAL_BASED_OUTPATIENT_CLINIC_OR_DEPARTMENT_OTHER): Payer: Medicare Other

## 2014-09-13 ENCOUNTER — Encounter (HOSPITAL_COMMUNITY): Payer: Medicare Other | Attending: Internal Medicine

## 2014-09-13 DIAGNOSIS — D62 Acute posthemorrhagic anemia: Secondary | ICD-10-CM

## 2014-09-13 DIAGNOSIS — K31811 Angiodysplasia of stomach and duodenum with bleeding: Secondary | ICD-10-CM | POA: Diagnosis not present

## 2014-09-13 DIAGNOSIS — D509 Iron deficiency anemia, unspecified: Secondary | ICD-10-CM | POA: Diagnosis not present

## 2014-09-13 LAB — CBC
HEMATOCRIT: 32.5 % — AB (ref 36.0–46.0)
Hemoglobin: 10.8 g/dL — ABNORMAL LOW (ref 12.0–15.0)
MCH: 30.4 pg (ref 26.0–34.0)
MCHC: 33.2 g/dL (ref 30.0–36.0)
MCV: 91.5 fL (ref 78.0–100.0)
Platelets: 176 10*3/uL (ref 150–400)
RBC: 3.55 MIL/uL — ABNORMAL LOW (ref 3.87–5.11)
RDW: 14.3 % (ref 11.5–15.5)
WBC: 7.6 10*3/uL (ref 4.0–10.5)

## 2014-09-13 LAB — FERRITIN: Ferritin: 50 ng/mL (ref 10–291)

## 2014-09-13 MED ORDER — SODIUM CHLORIDE 0.9 % IV SOLN
510.0000 mg | Freq: Once | INTRAVENOUS | Status: AC
  Administered 2014-09-13: 510 mg via INTRAVENOUS
  Filled 2014-09-13: qty 17

## 2014-09-13 MED ORDER — SODIUM CHLORIDE 0.9 % IV SOLN
Freq: Once | INTRAVENOUS | Status: AC
  Administered 2014-09-13: 12:00:00 via INTRAVENOUS

## 2014-09-13 NOTE — Progress Notes (Signed)
Kara Santos Tolerated iron infusion well.  Discharged ambulatory

## 2014-09-13 NOTE — Progress Notes (Signed)
Labs for ferr,ferr

## 2014-09-13 NOTE — Patient Instructions (Signed)
[place] Discharge Instructions  RECOMMENDATIONS MADE BY THE CONSULTANT AND ANY TEST RESULTS WILL BE SENT TO YOUR REFERRING PHYSICIAN.  YOu had an iron transfusion today.  You will return next week for your second dose of iron.  Please call the clinic if you have any questions or concerns  Thank you for choosing [facility] to provide your oncology and hematology care.  To afford each patient quality time with our providers, please arrive at least 15 minutes before your scheduled appointment time.  With your help, our goal is to use those 15 minutes to complete the necessary work-up to ensure our physicians have the information they need to help with your evaluation and healthcare recommendations.    Effective January 1st, 2014, we ask that you re-schedule your appointment with our physicians should you arrive 10 or more minutes late for your appointment.  We strive to give you quality time with our providers, and arriving late affects you and other patients whose appointments are after yours.    Again, thank you for choosing [facility].  Our hope is that these requests will decrease the amount of time that you wait before being seen by our physicians.       _____________________________________________________________  Should you have questions after your visit to [facility], please contact our office at [phone] between the hours of 8:30 a.m. and 5:00 p.m.  Voicemails left after 4:30 p.m. will not be returned until the following business day.  For prescription refill requests, have your pharmacy contact our office with your prescription refill request.

## 2014-09-18 ENCOUNTER — Other Ambulatory Visit (HOSPITAL_COMMUNITY): Payer: Self-pay | Admitting: Oncology

## 2014-09-18 DIAGNOSIS — D509 Iron deficiency anemia, unspecified: Secondary | ICD-10-CM

## 2014-09-18 MED ORDER — IRON POLYSACCH CMPLX-B12-FA 150-0.025-1 MG PO CAPS: 1.0000 | ORAL_CAPSULE | Freq: Every day | ORAL | Status: DC

## 2014-09-20 ENCOUNTER — Encounter (HOSPITAL_BASED_OUTPATIENT_CLINIC_OR_DEPARTMENT_OTHER): Payer: Medicare Other

## 2014-09-20 ENCOUNTER — Encounter (HOSPITAL_COMMUNITY): Payer: Self-pay

## 2014-09-20 DIAGNOSIS — K31811 Angiodysplasia of stomach and duodenum with bleeding: Secondary | ICD-10-CM

## 2014-09-20 DIAGNOSIS — D509 Iron deficiency anemia, unspecified: Secondary | ICD-10-CM

## 2014-09-20 MED ORDER — SODIUM CHLORIDE 0.9 % IV SOLN
510.0000 mg | Freq: Once | INTRAVENOUS | Status: AC
  Administered 2014-09-20: 510 mg via INTRAVENOUS
  Filled 2014-09-20: qty 17

## 2014-09-20 MED ORDER — SODIUM CHLORIDE 0.9 % IV SOLN
Freq: Once | INTRAVENOUS | Status: AC
  Administered 2014-09-20: 11:00:00 via INTRAVENOUS

## 2014-09-20 NOTE — Progress Notes (Signed)
Patient tolerated infusion well.

## 2014-09-20 NOTE — Patient Instructions (Signed)
[place] Discharge Instructions  RECOMMENDATIONS MADE BY THE CONSULTANT AND ANY TEST RESULTS WILL BE SENT TO YOUR REFERRING PHYSICIAN.  Today you were given Feraheme Please follow up as scheduled. Please call for any questions or concerns.    Thank you for choosing [place] to provide your oncology and hematology care.  To afford each patient quality time with our providers, please arrive at least 15 minutes before your scheduled appointment time.  With your help, our goal is to use those 15 minutes to complete the necessary work-up to ensure our physicians have the information they need to help with your evaluation and healthcare recommendations.    Effective January 1st, 2014, we ask that you re-schedule your appointment with our physicians should you arrive 10 or more minutes late for your appointment.  We strive to give you quality time with our providers, and arriving late affects you and other patients whose appointments are after yours.    Again, thank you for choosing [facility].  Our hope is that these requests will decrease the amount of time that you wait before being seen by our physicians.       _____________________________________________________________  Should you have questions after your visit to [facility], please contact our office at [phone] between the hours of 8:30 a.m. and 4:30 p.m.  Voicemails left after 4:30 p.m. will not be returned until the following business day.  For prescription refill requests, have your pharmacy contact our office with your prescription refill request.    _______________________________________________________________  We hope that we have given you very good care.  You may receive a patient satisfaction survey in the mail, please complete it and return it as soon as possible.  We value your  feedback!  _______________________________________________________________  Have you asked about our STAR program?  STAR stands for Survivorship Training and Rehabilitation, and this is a nationally recognized cancer care program that focuses on survivorship and rehabilitation.  Cancer and cancer treatments may cause problems, such as, pain, making you feel tired and keeping you from doing the things that you need or want to do. Cancer rehabilitation can help. Our goal is to reduce these troubling effects and help you have the best quality of life possible.  You may receive a survey from a nurse that asks questions about your current state of health.  Based on the survey results, all eligible patients will be referred to the [facility] program for an evaluation so we can better serve you!  A frequently asked questions sheet is available upon request.

## 2014-09-30 ENCOUNTER — Other Ambulatory Visit (HOSPITAL_COMMUNITY): Payer: Self-pay | Admitting: Oncology

## 2014-10-03 DIAGNOSIS — E119 Type 2 diabetes mellitus without complications: Secondary | ICD-10-CM | POA: Diagnosis not present

## 2014-10-03 DIAGNOSIS — I10 Essential (primary) hypertension: Secondary | ICD-10-CM | POA: Diagnosis not present

## 2014-10-03 DIAGNOSIS — E039 Hypothyroidism, unspecified: Secondary | ICD-10-CM | POA: Diagnosis not present

## 2014-10-03 DIAGNOSIS — D500 Iron deficiency anemia secondary to blood loss (chronic): Secondary | ICD-10-CM | POA: Diagnosis not present

## 2014-11-17 ENCOUNTER — Emergency Department (HOSPITAL_COMMUNITY): Payer: Medicare Other

## 2014-11-17 ENCOUNTER — Inpatient Hospital Stay (HOSPITAL_COMMUNITY)
Admission: EM | Admit: 2014-11-17 | Discharge: 2014-11-21 | DRG: 200 | Disposition: A | Discharge status: Home Health | Payer: Medicare Other | Attending: General Surgery | Admitting: General Surgery

## 2014-11-17 ENCOUNTER — Encounter (HOSPITAL_COMMUNITY): Payer: Self-pay | Admitting: *Deleted

## 2014-11-17 DIAGNOSIS — E039 Hypothyroidism, unspecified: Secondary | ICD-10-CM | POA: Diagnosis present

## 2014-11-17 DIAGNOSIS — J439 Emphysema, unspecified: Secondary | ICD-10-CM | POA: Diagnosis not present

## 2014-11-17 DIAGNOSIS — S4991XA Unspecified injury of right shoulder and upper arm, initial encounter: Secondary | ICD-10-CM | POA: Diagnosis not present

## 2014-11-17 DIAGNOSIS — E119 Type 2 diabetes mellitus without complications: Secondary | ICD-10-CM | POA: Diagnosis present

## 2014-11-17 DIAGNOSIS — D500 Iron deficiency anemia secondary to blood loss (chronic): Secondary | ICD-10-CM | POA: Diagnosis not present

## 2014-11-17 DIAGNOSIS — T797XXA Traumatic subcutaneous emphysema, initial encounter: Secondary | ICD-10-CM | POA: Diagnosis present

## 2014-11-17 DIAGNOSIS — S2249XA Multiple fractures of ribs, unspecified side, initial encounter for closed fracture: Secondary | ICD-10-CM | POA: Diagnosis present

## 2014-11-17 DIAGNOSIS — G309 Alzheimer's disease, unspecified: Secondary | ICD-10-CM | POA: Diagnosis present

## 2014-11-17 DIAGNOSIS — K5520 Angiodysplasia of colon without hemorrhage: Secondary | ICD-10-CM | POA: Diagnosis not present

## 2014-11-17 DIAGNOSIS — S0990XA Unspecified injury of head, initial encounter: Secondary | ICD-10-CM | POA: Diagnosis not present

## 2014-11-17 DIAGNOSIS — S270XXA Traumatic pneumothorax, initial encounter: Principal | ICD-10-CM | POA: Diagnosis present

## 2014-11-17 DIAGNOSIS — W1811XA Fall from or off toilet without subsequent striking against object, initial encounter: Secondary | ICD-10-CM | POA: Diagnosis not present

## 2014-11-17 DIAGNOSIS — S2241XA Multiple fractures of ribs, right side, initial encounter for closed fracture: Secondary | ICD-10-CM | POA: Diagnosis not present

## 2014-11-17 DIAGNOSIS — W19XXXA Unspecified fall, initial encounter: Secondary | ICD-10-CM

## 2014-11-17 DIAGNOSIS — I700 Atherosclerosis of aorta: Secondary | ICD-10-CM | POA: Diagnosis not present

## 2014-11-17 DIAGNOSIS — S199XXA Unspecified injury of neck, initial encounter: Secondary | ICD-10-CM | POA: Diagnosis not present

## 2014-11-17 DIAGNOSIS — Y92009 Unspecified place in unspecified non-institutional (private) residence as the place of occurrence of the external cause: Secondary | ICD-10-CM

## 2014-11-17 DIAGNOSIS — R030 Elevated blood-pressure reading, without diagnosis of hypertension: Secondary | ICD-10-CM | POA: Diagnosis not present

## 2014-11-17 DIAGNOSIS — Z9049 Acquired absence of other specified parts of digestive tract: Secondary | ICD-10-CM | POA: Diagnosis not present

## 2014-11-17 DIAGNOSIS — D62 Acute posthemorrhagic anemia: Secondary | ICD-10-CM | POA: Diagnosis present

## 2014-11-17 DIAGNOSIS — M549 Dorsalgia, unspecified: Secondary | ICD-10-CM | POA: Diagnosis not present

## 2014-11-17 DIAGNOSIS — J939 Pneumothorax, unspecified: Secondary | ICD-10-CM

## 2014-11-17 DIAGNOSIS — F0280 Dementia in other diseases classified elsewhere without behavioral disturbance: Secondary | ICD-10-CM | POA: Diagnosis present

## 2014-11-17 DIAGNOSIS — J942 Hemothorax: Secondary | ICD-10-CM | POA: Diagnosis not present

## 2014-11-17 DIAGNOSIS — M25511 Pain in right shoulder: Secondary | ICD-10-CM | POA: Diagnosis not present

## 2014-11-17 HISTORY — DX: Unspecified fall, initial encounter: W19.XXXA

## 2014-11-17 HISTORY — DX: Unspecified fall, initial encounter: Y92.009

## 2014-11-17 LAB — URINALYSIS, ROUTINE W REFLEX MICROSCOPIC
Bilirubin Urine: NEGATIVE
Glucose, UA: NEGATIVE mg/dL
Hgb urine dipstick: NEGATIVE
Ketones, ur: NEGATIVE mg/dL
NITRITE: NEGATIVE
PH: 6 (ref 5.0–8.0)
Protein, ur: NEGATIVE mg/dL
Specific Gravity, Urine: 1.02 (ref 1.005–1.030)
Urobilinogen, UA: 0.2 mg/dL (ref 0.0–1.0)

## 2014-11-17 LAB — CBC WITH DIFFERENTIAL/PLATELET
BASOS ABS: 0 10*3/uL (ref 0.0–0.1)
BASOS PCT: 0 % (ref 0–1)
Eosinophils Absolute: 0 10*3/uL (ref 0.0–0.7)
Eosinophils Relative: 0 % (ref 0–5)
HCT: 35.6 % — ABNORMAL LOW (ref 36.0–46.0)
HEMOGLOBIN: 12.1 g/dL (ref 12.0–15.0)
Lymphocytes Relative: 9 % — ABNORMAL LOW (ref 12–46)
Lymphs Abs: 0.8 10*3/uL (ref 0.7–4.0)
MCH: 30.6 pg (ref 26.0–34.0)
MCHC: 34 g/dL (ref 30.0–36.0)
MCV: 89.9 fL (ref 78.0–100.0)
Monocytes Absolute: 0.6 10*3/uL (ref 0.1–1.0)
Monocytes Relative: 7 % (ref 3–12)
NEUTROS PCT: 84 % — AB (ref 43–77)
Neutro Abs: 7.5 10*3/uL (ref 1.7–7.7)
Platelets: 139 10*3/uL — ABNORMAL LOW (ref 150–400)
RBC: 3.96 MIL/uL (ref 3.87–5.11)
RDW: 14.2 % (ref 11.5–15.5)
WBC: 8.9 10*3/uL (ref 4.0–10.5)

## 2014-11-17 LAB — COMPREHENSIVE METABOLIC PANEL
ALK PHOS: 71 U/L (ref 39–117)
ALT: 64 U/L — AB (ref 0–35)
AST: 122 U/L — ABNORMAL HIGH (ref 0–37)
Albumin: 4.2 g/dL (ref 3.5–5.2)
Anion gap: 6 (ref 5–15)
BILIRUBIN TOTAL: 0.7 mg/dL (ref 0.3–1.2)
BUN: 17 mg/dL (ref 6–23)
CHLORIDE: 105 mmol/L (ref 96–112)
CO2: 28 mmol/L (ref 19–32)
Calcium: 8.8 mg/dL (ref 8.4–10.5)
Creatinine, Ser: 0.6 mg/dL (ref 0.50–1.10)
GFR calc Af Amer: 86 mL/min — ABNORMAL LOW (ref >90)
GFR calc non Af Amer: 75 mL/min — ABNORMAL LOW (ref >90)
GLUCOSE: 124 mg/dL — AB (ref 70–99)
Potassium: 3.7 mmol/L (ref 3.5–5.1)
SODIUM: 139 mmol/L (ref 135–145)
TOTAL PROTEIN: 6.8 g/dL (ref 6.0–8.3)

## 2014-11-17 LAB — URINE MICROSCOPIC-ADD ON

## 2014-11-17 LAB — GLUCOSE, CAPILLARY
GLUCOSE-CAPILLARY: 117 mg/dL — AB (ref 70–99)
Glucose-Capillary: 122 mg/dL — ABNORMAL HIGH (ref 70–99)

## 2014-11-17 LAB — CBG MONITORING, ED: Glucose-Capillary: 107 mg/dL — ABNORMAL HIGH (ref 70–99)

## 2014-11-17 MED ORDER — LEVOTHYROXINE SODIUM 100 MCG PO TABS
100.0000 ug | ORAL_TABLET | Freq: Every day | ORAL | Status: DC
  Administered 2014-11-18 – 2014-11-21 (×4): 100 ug via ORAL
  Filled 2014-11-17 (×5): qty 1

## 2014-11-17 MED ORDER — ENOXAPARIN SODIUM 30 MG/0.3ML ~~LOC~~ SOLN
30.0000 mg | SUBCUTANEOUS | Status: DC
  Administered 2014-11-17 – 2014-11-20 (×4): 30 mg via SUBCUTANEOUS
  Filled 2014-11-17 (×4): qty 0.3

## 2014-11-17 MED ORDER — VITAMIN B-12 100 MCG PO TABS
100.0000 ug | ORAL_TABLET | Freq: Every day | ORAL | Status: DC
  Administered 2014-11-18 – 2014-11-21 (×4): 100 ug via ORAL
  Filled 2014-11-17 (×4): qty 1

## 2014-11-17 MED ORDER — VITAMIN B-12 100 MCG PO TABS: 100.0000 ug | ORAL_TABLET | Freq: Every day | ORAL | Status: DC

## 2014-11-17 MED ORDER — SODIUM CHLORIDE 0.9 % IV BOLUS (SEPSIS)
500.0000 mL | Freq: Once | INTRAVENOUS | Status: AC
  Administered 2014-11-17: 500 mL via INTRAVENOUS

## 2014-11-17 MED ORDER — MORPHINE SULFATE 2 MG/ML IJ SOLN
2.0000 mg | Freq: Once | INTRAMUSCULAR | Status: AC
  Administered 2014-11-17: 2 mg via INTRAVENOUS
  Filled 2014-11-17: qty 1

## 2014-11-17 MED ORDER — MORPHINE SULFATE 2 MG/ML IJ SOLN
2.0000 mg | INTRAMUSCULAR | Status: DC | PRN
Start: 2014-11-17 — End: 2014-11-19

## 2014-11-17 MED ORDER — ONDANSETRON HCL 4 MG/2ML IJ SOLN
4.0000 mg | Freq: Once | INTRAMUSCULAR | Status: AC
  Administered 2014-11-17: 4 mg via INTRAVENOUS
  Filled 2014-11-17: qty 2

## 2014-11-17 MED ORDER — ONDANSETRON HCL 4 MG PO TABS: 4.0000 mg | ORAL_TABLET | Freq: Four times a day (QID) | ORAL | Status: DC | PRN

## 2014-11-17 MED ORDER — HYDROCODONE-ACETAMINOPHEN 5-325 MG PO TABS: 2.0000 | ORAL_TABLET | ORAL | Status: DC | PRN

## 2014-11-17 MED ORDER — ONDANSETRON HCL 4 MG/2ML IJ SOLN: 4.0000 mg | Freq: Four times a day (QID) | INTRAMUSCULAR | Status: DC | PRN

## 2014-11-17 MED ORDER — ESCITALOPRAM OXALATE 10 MG PO TABS
10.0000 mg | ORAL_TABLET | Freq: Every day | ORAL | Status: DC
  Administered 2014-11-18 – 2014-11-21 (×4): 10 mg via ORAL
  Filled 2014-11-17 (×4): qty 1

## 2014-11-17 MED ORDER — MORPHINE SULFATE 2 MG/ML IJ SOLN: 1.0000 mg | INTRAMUSCULAR | Status: DC | PRN

## 2014-11-17 MED ORDER — ACETAMINOPHEN 325 MG PO TABS
650.0000 mg | ORAL_TABLET | ORAL | Status: DC | PRN
  Administered 2014-11-17: 650 mg via ORAL
  Filled 2014-11-17: qty 2

## 2014-11-17 MED ORDER — PNEUMOCOCCAL VAC POLYVALENT 25 MCG/0.5ML IJ INJ
0.5000 mL | INJECTION | INTRAMUSCULAR | Status: DC
  Filled 2014-11-17: qty 0.5

## 2014-11-17 MED ORDER — POTASSIUM CHLORIDE IN NACL 20-0.9 MEQ/L-% IV SOLN
INTRAVENOUS | Status: DC
  Administered 2014-11-17: 21:00:00 via INTRAVENOUS
  Filled 2014-11-17: qty 1000

## 2014-11-17 MED ORDER — HYDROCODONE-ACETAMINOPHEN 5-325 MG PO TABS
1.0000 | ORAL_TABLET | ORAL | Status: DC | PRN
  Administered 2014-11-19: 1 via ORAL
  Filled 2014-11-17: qty 1

## 2014-11-17 MED ORDER — INSULIN ASPART 100 UNIT/ML ~~LOC~~ SOLN
0.0000 [IU] | Freq: Three times a day (TID) | SUBCUTANEOUS | Status: DC
  Administered 2014-11-18 – 2014-11-20 (×4): 1 [IU] via SUBCUTANEOUS

## 2014-11-17 NOTE — Progress Notes (Signed)
[id number], pt arrived to [facility] unable to tx into [facility] due to ED has pt in line-up

## 2014-11-17 NOTE — ED Notes (Signed)
Pt comes from home by EMS. Pt fell in the middle of the night and hit her right side. Pt denies hitting her head. Pt is alert and oriented x4. NAD noted.    CBG per EMS was 179.

## 2014-11-17 NOTE — ED Notes (Signed)
MD at bedside. 

## 2014-11-17 NOTE — ED Notes (Signed)
Transfer from [facility] by Care link for evaluation by trauma Doctor. Patient fell off toilet middle of the night took tylenol and pain worsening right ribs. Given prior to arrival 500 ml 0.9 NS, Zofran mg IVP and morphine mg IVP. Patient alert answering and following commands appropriate.

## 2014-11-17 NOTE — ED Provider Notes (Signed)
CSN: [id number]     Arrival date & time 11/17/14  1128 History  This chart was scribed for Shaune Pollack, MD by Zola Button, ED Scribe. This patient was seen in room APA14/APA14 and the patient's care was started at 1:23 PM.       Chief Complaint  Patient presents with  . Fall   The history is provided by the patient. No language interpreter was used.    HPI Comments: EMERALD SHOR is a 81 y.o. female with a hx of DM, hypothyroidism, iron deficiency anemia, depression and early onset Alzheimer's disease brought in by EMS who presents to the Emergency Department complaining of a fall that occurred last night around midnight. Patient fell off of the toilet onto the floor, hitting her right side. She reports having associated chest wall pain behind right breast, back pain and neck pain. She then took a Tylenol and went back to sleep, but her pain worsened in the morning when she woke up. Patient denies head injury, HA, and SOB.   PCP: Dr. Luan Pulling She is also followed by an oncologist for thyroid cancer, which she has had surgery for  Past Medical History  Diagnosis Date  . Anemia 11/07/2011    chronic IDA requiring multiple transfusions  . Diabetes mellitus   . Hypothyroidism     s/p thyroid surgery for thyroid cancer  . GERD (gastroesophageal reflux disease)   . Iron deficiency anemia 05/05/2012  . Depression   . Closed fracture of greater trochanter of femur 02/2013    left  . AVM (arteriovenous malformation) of stomach, acquired with hemorrhage 12/04/2013  . Alzheimer's disease, early onset 59   Past Surgical History  Procedure Laterality Date  . Esophagogastroduodenoscopy  08/2006    Dr. Trevor Iha [facility], large duodenal diverticulum  . Colonoscopy  08/2006    Dr. Cecelia Byars sided diverticulosis  . Givens small bowel capsule  08/2006    few gastric erosions, numerous small bowel AVMs with SB erosions  . Double balloon enteroscopy  06/1008    [facility]-->APC of 12 SB AVMs in jejunum. 6  feet of jejunum inspected  . Right shoulder surgery    . Cholecystectomy    . Appendectomy    . Bilateral cataract extractions    . Breast reduction surgery    . Thyroid surgery      thyroid cancer   Family History  Problem Relation Age of Onset  . Cancer Mother   . Cancer Father   . Diabetes Father   . Diabetes Sister   . Cancer Brother    History  Substance Use Topics  . Smoking status: Never Smoker   . Smokeless tobacco: Never Used  . Alcohol Use: No   OB History    No data available     Review of Systems  Respiratory: Negative for shortness of breath.   Cardiovascular: Positive for chest pain.  Musculoskeletal: Positive for back pain and neck pain.  Neurological: Negative for headaches.  All other systems reviewed and are negative.     Allergies  Asa  Home Medications   Prior to Admission medications   Medication Sig Start Date End Date Taking? Authorizing Provider  acetaminophen (TYLENOL) 325 MG tablet Take 650 mg by mouth every 4 (four) hours as needed for pain.    Historical Provider, MD  clorazepate (TRANXENE) 7.5 MG tablet Take 7.5 mg by mouth 3 (three) times daily as needed for anxiety.    Historical Provider, MD  clotrimazole-betamethasone (LOTRISONE) cream  Apply topically 2 (two) times daily. 06/22/13   Baird Cancer, PA-C  cyanocobalamin 100 MCG tablet Take 100 mcg by mouth daily.    Historical Provider, MD  donepezil (ARICEPT) 5 MG tablet Take 5 mg by mouth at bedtime.    Historical Provider, MD  escitalopram (LEXAPRO) 10 MG tablet TAKE ONE TABLET BY MOUTH ONCE DAILY. 10/02/14   Baird Cancer, PA-C  Iron Polysacch Cmplx-B12-FA (POLY-IRON 150 FORTE) 150-0.025-1 MG CAPS Take 1 tablet by mouth daily. 09/18/14   Baird Cancer, PA-C  METFORMIN HCL PO Take 250 mg by mouth 2 (two) times daily.    Historical Provider, MD  Multiple Vitamin (MULTIVITAMIN) tablet Take 1 tablet by mouth daily.    Historical Provider, MD  RABEprazole (ACIPHEX) 20 MG  tablet Take 20 mg by mouth daily.    Historical Provider, MD   BP 144/68 mmHg  Pulse 78  Temp(Src) 97.9 F (36.6 C) (Oral)  Resp 16  Ht 4\' 11"  (1.499 m)  Wt 93 lb (42.185 kg)  BMI 18.77 kg/m2  SpO2 97% Physical Exam  Constitutional: She is oriented to person, place, and time. She appears well-developed and well-nourished. No distress.  HENT:  Head: Normocephalic and atraumatic.  Mouth/Throat: Oropharynx is clear and moist. No oropharyngeal exudate.  Eyes: Conjunctivae and EOM are normal. Pupils are equal, round, and reactive to light.  Neck: Normal range of motion. Neck supple.  Cardiovascular: Normal rate, regular rhythm, normal heart sounds and intact distal pulses.   Pulmonary/Chest: Effort normal.  Good bilateral breath sounds. Decreased somewhat on the right.  Abdominal: Soft. Bowel sounds are normal.  Musculoskeletal: Normal range of motion. She exhibits no edema or tenderness.  Some ttp mid cervical spine  Neurological: She is alert and oriented to person, place, and time. She has normal reflexes. She displays normal reflexes. No cranial nerve deficit. She exhibits normal muscle tone. Coordination normal.  Skin: Skin is warm and dry. No rash noted.  Psychiatric: She has a normal mood and affect. Her behavior is normal. Thought content normal.  Nursing note and vitals reviewed.   ED Course  Procedures  DIAGNOSTIC STUDIES: Oxygen Saturation is 97% on room air, normal by my interpretation.    COORDINATION OF CARE: 1:30 PM-Discussed treatment plan which includes medications, labs and imaging with pt at bedside and pt agreed to plan.    Labs Review Labs Reviewed - No data to display  Imaging Review Dg Ribs Unilateral W/chest Right  11/17/2014   CLINICAL DATA:  Fall with right rib and shoulder pain. Initial encounter.  EXAM: RIGHT RIBS AND CHEST - 3+ VIEW  COMPARISON:  02/06/2013  FINDINGS: Acute fractures of the right sixth, seventh, and eighth ribs with mild  displacement. There is associated air leak with soft tissue gas and a small right apical pneumothorax (less than 10%).  No cardiomegaly or aortic contour abnormality. There is chronic interstitial coarsening without evidence of contusion. No evidence for hemothorax.  Critical Value/emergent results were called by telephone at the time of interpretation on 11/17/2014 at 1:14 pm to Dr. Fredia Sorrow , who verbally acknowledged these results.  IMPRESSION: 1. Small right apical pneumothorax. 2. Right 6th, 7th, and 8th rib fractures.   Electronically Signed   By: Monte Fantasia M.D.   On: 11/17/2014 13:14   Dg Shoulder Right  11/17/2014   CLINICAL DATA:  Right rib and right shoulder pain.  EXAM: RIGHT SHOULDER - 2+ VIEW  COMPARISON:  06/17/2015 and rib examination 11/17/2014  FINDINGS: The right humeral head is elevated with relation to the right acromion. Findings suggest underlying rotator cuff disease or tear. Mild irregularity at the right [facility] joint consistent with degenerative changes. There is a displaced fracture involving the right seventh rib. The right shoulder appears to be located but the scapular Y-view is limited. No acute fracture involving the right shoulder.  IMPRESSION: Right seventh rib fracture.  No acute bone abnormality involving the right shoulder. The right humeral head is elevated and raises concern for underlying rotator cuff disease.   Electronically Signed   By: Markus Daft M.D.   On: 11/17/2014 13:14   Ct Head Wo Contrast  11/17/2014   CLINICAL DATA:  [place] off the toilet around midnight hitting right side, now with right-sided soreness. History of thyroid cancer. Initial encounter.  EXAM: CT HEAD WITHOUT CONTRAST  CT CERVICAL SPINE WITHOUT CONTRAST  TECHNIQUE: Multidetector CT imaging of the head and cervical spine was performed following the standard protocol without intravenous contrast. Multiplanar CT image reconstructions of the cervical spine were also generated.  COMPARISON:  Head  CT - 07/22/2013; 02/06/2013; cervical spine CT -02/06/2013  FINDINGS: CT HEAD FINDINGS  Similar findings of atrophy with sulcal prominence and prominence of the bifrontal extra-axial spaces. Grossly unchanged rather extensive periventricular hypodensities compatible with microvascular ischemic disease. The gray-white differentiation is otherwise well maintained without CT evidence of acute large territory infarct. No intraparenchymal or extra-axial mass or hemorrhage. Unchanged size and configuration of the ventricles and cisterns. No shift. Intracranial atherosclerosis.  Limited visualization of the paranasal sinuses and mastoid air cells is normal. Regional soft tissues appear normal. No displaced calvarial fracture.  CT CERVICAL SPINE FINDINGS  C1 to the superior endplate of T3 is imaged.  There is minimal (approximately 1-2 mm) of anterolisthesis of C4 upon C5. There is minimal (approximately 2-3 mm) of retrolisthesis of C5 upon C6. There is unchanged straightening and slight reversal of the expected cervical lordosis with mild kyphosis centered about the C4-C5 articulation. The dens is normally positioned between the lateral masses of C1. Normal atlantodental and atlantoaxial articulations.  No fracture or static subluxation of the cervical spine. Cervical vertebral body heights are preserved. Prevertebral soft tissues are normal.  Moderate severe multilevel cervical spine in DDD, worse at C5-C6 with disc space height loss, endplate irregularity and small posteriorly directed disc osteophyte complex at this location. Posteriorly directed disc osteophyte complexes are also seen at C3-C4 and C4-C5.  There is unchanged partial ossification of the posterior longitudinal ligament / interspinous ligament between the C3-C4 and C4-C5 spinous processes (sagittal image 34, series 7). There is also unchanged ossification of the nuchal ligament posterior to the C5 spinous process.  Atherosclerotic plaque with the bilateral  carotid bulbs. No bulky cervical lymphadenopathy on this noncontrast examination. The thyroid is surgically absent.  Limited visualization of the lung apices demonstrates a trace right apical pneumothorax (representative images 63 and 77, series 5).  IMPRESSION: 1. Similar findings of atrophy and microvascular ischemic disease without acute intracranial process. 2. No fracture or static subluxation of the cervical spine. 3. Moderate to severe multilevel cervical spine DDD, worse at C5-C6. 4. Re- demonstrated trace right apical pneumothorax as was seen on chest radiograph performed earlier same day.   Electronically Signed   By: Sandi Mariscal M.D.   On: 11/17/2014 15:47   Ct Cervical Spine Wo Contrast  11/17/2014   CLINICAL DATA:  [place] off the toilet around midnight hitting right side, now with right-sided soreness. History  of thyroid cancer. Initial encounter.  EXAM: CT HEAD WITHOUT CONTRAST  CT CERVICAL SPINE WITHOUT CONTRAST  TECHNIQUE: Multidetector CT imaging of the head and cervical spine was performed following the standard protocol without intravenous contrast. Multiplanar CT image reconstructions of the cervical spine were also generated.  COMPARISON:  Head CT - 07/22/2013; 02/06/2013; cervical spine CT -02/06/2013  FINDINGS: CT HEAD FINDINGS  Similar findings of atrophy with sulcal prominence and prominence of the bifrontal extra-axial spaces. Grossly unchanged rather extensive periventricular hypodensities compatible with microvascular ischemic disease. The gray-white differentiation is otherwise well maintained without CT evidence of acute large territory infarct. No intraparenchymal or extra-axial mass or hemorrhage. Unchanged size and configuration of the ventricles and cisterns. No shift. Intracranial atherosclerosis.  Limited visualization of the paranasal sinuses and mastoid air cells is normal. Regional soft tissues appear normal. No displaced calvarial fracture.  CT CERVICAL SPINE FINDINGS  C1 to  the superior endplate of T3 is imaged.  There is minimal (approximately 1-2 mm) of anterolisthesis of C4 upon C5. There is minimal (approximately 2-3 mm) of retrolisthesis of C5 upon C6. There is unchanged straightening and slight reversal of the expected cervical lordosis with mild kyphosis centered about the C4-C5 articulation. The dens is normally positioned between the lateral masses of C1. Normal atlantodental and atlantoaxial articulations.  No fracture or static subluxation of the cervical spine. Cervical vertebral body heights are preserved. Prevertebral soft tissues are normal.  Moderate severe multilevel cervical spine in DDD, worse at C5-C6 with disc space height loss, endplate irregularity and small posteriorly directed disc osteophyte complex at this location. Posteriorly directed disc osteophyte complexes are also seen at C3-C4 and C4-C5.  There is unchanged partial ossification of the posterior longitudinal ligament / interspinous ligament between the C3-C4 and C4-C5 spinous processes (sagittal image 34, series 7). There is also unchanged ossification of the nuchal ligament posterior to the C5 spinous process.  Atherosclerotic plaque with the bilateral carotid bulbs. No bulky cervical lymphadenopathy on this noncontrast examination. The thyroid is surgically absent.  Limited visualization of the lung apices demonstrates a trace right apical pneumothorax (representative images 63 and 77, series 5).  IMPRESSION: 1. Similar findings of atrophy and microvascular ischemic disease without acute intracranial process. 2. No fracture or static subluxation of the cervical spine. 3. Moderate to severe multilevel cervical spine DDD, worse at C5-C6. 4. Re- demonstrated trace right apical pneumothorax as was seen on chest radiograph performed earlier same day.   Electronically Signed   By: Sandi Mariscal M.D.   On: 11/17/2014 15:47     EKG Interpretation   Date/Time:  Friday November 17 2014 11:31:56  EST Ventricular Rate:  81 PR Interval:  151 QRS Duration: 84 QT Interval:  386 QTC Calculation: 448 R Axis:   40 Text Interpretation:  Sinus rhythm No significant change since last  tracing Confirmed by Josedaniel Haye MD, Andee Poles [phone]) on 11/17/2014 3:41:00 PM      MDM   Final diagnoses:  Fall  Fracture of multiple ribs, right, closed, initial encounter  Pneumothorax, right   81 y.o. Female with mechanical fall last nght presents today with multiple rib fractures and left side pneumothorax about 10%.  Patient does not currently required chest tube- no respiratory distress, pneumo small.  Patient improved with pain meds.  CT neck and head results pending.  Discussed with Dr. Hulen Skains and will transfer to [place] where trauma will assess and patient will be admitted- there is no on site trauma, surgery or pulmonary  if patient needs chest tube here.   I personally performed the services described in this documentation, which was scribed in my presence. The recorded information has been reviewed and considered.     Shaune Pollack, MD 11/17/14 [phone]

## 2014-11-17 NOTE — Progress Notes (Signed)
[id number] called ED report received, ED RN stated pt was awaiting diet tray and to check CBG prior to tx to [place]

## 2014-11-17 NOTE — H&P (Signed)
History   Kara Santos is an 81 y.o. female.   Chief Complaint:  Chief Complaint  Patient presents with  . Fall    Fall   she presents as a transfer from [facility]. She presented there earlier today after a ground-level fall yesterday. She fell off her toilet hitting her right side. Because of increasing discomfort, she presented to the hospital. She was found to have a small right apical pneumothorax with 3 rib fractures so was transferred to the trauma service here for further care. She reports mild right-sided chest pain. She denies shortness of breath. She denies headache, neck pain, or abdominal pain. She is otherwise without complaints.  Past Medical History  Diagnosis Date  . Anemia 11/07/2011    chronic IDA requiring multiple transfusions  . Diabetes mellitus   . Hypothyroidism     s/p thyroid surgery for thyroid cancer  . GERD (gastroesophageal reflux disease)   . Iron deficiency anemia 05/05/2012  . Depression   . Closed fracture of greater trochanter of femur 02/2013    left  . AVM (arteriovenous malformation) of stomach, acquired with hemorrhage 12/04/2013  . Alzheimer's disease, early onset 45    Past Surgical History  Procedure Laterality Date  . Esophagogastroduodenoscopy  08/2006    Dr. Trevor Iha [facility], large duodenal diverticulum  . Colonoscopy  08/2006    Dr. Cecelia Byars sided diverticulosis  . Givens small bowel capsule  08/2006    few gastric erosions, numerous small bowel AVMs with SB erosions  . Double balloon enteroscopy  06/1008    [facility]-->APC of 12 SB AVMs in jejunum. 6 feet of jejunum inspected  . Right shoulder surgery    . Cholecystectomy    . Appendectomy    . Bilateral cataract extractions    . Breast reduction surgery    . Thyroid surgery      thyroid cancer    Family History  Problem Relation Age of Onset  . Cancer Mother   . Cancer Father   . Diabetes Father   . Diabetes Sister   . Cancer Brother    Social History:   reports that she has never smoked. She has never used smokeless tobacco. She reports that she does not drink alcohol or use illicit drugs.  Allergies   Allergies  Allergen Reactions  . Asa [Aspirin]     Home Medications   (Not in a hospital admission)  Trauma Course   Results for orders placed or performed during the hospital encounter of 11/17/14 (from the past 48 hour(s))  CBC with Differential/Platelet     Status: Abnormal   Collection Time: 11/17/14  1:43 PM  Result Value Ref Range   WBC 8.9 4.0 - 10.5 K/uL   RBC 3.96 3.87 - 5.11 MIL/uL   Hemoglobin 12.1 12.0 - 15.0 g/dL   HCT 35.6 (L) 36.0 - 46.0 %   MCV 89.9 78.0 - 100.0 fL   MCH 30.6 26.0 - 34.0 pg   MCHC 34.0 30.0 - 36.0 g/dL   RDW 14.2 11.5 - 15.5 %   Platelets 139 (L) 150 - 400 K/uL   Neutrophils Relative % 84 (H) 43 - 77 %   Neutro Abs 7.5 1.7 - 7.7 K/uL   Lymphocytes Relative 9 (L) 12 - 46 %   Lymphs Abs 0.8 0.7 - 4.0 K/uL   Monocytes Relative 7 3 - 12 %   Monocytes Absolute 0.6 0.1 - 1.0 K/uL   Eosinophils Relative 0 0 - 5 %  Eosinophils Absolute 0.0 0.0 - 0.7 K/uL   Basophils Relative 0 0 - 1 %   Basophils Absolute 0.0 0.0 - 0.1 K/uL  Comprehensive metabolic panel     Status: Abnormal   Collection Time: 11/17/14  1:43 PM  Result Value Ref Range   Sodium 139 135 - 145 mmol/L   Potassium 3.7 3.5 - 5.1 mmol/L   Chloride 105 96 - 112 mmol/L   CO2 28 19 - 32 mmol/L   Glucose, Bld 124 (H) 70 - 99 mg/dL   BUN 17 6 - 23 mg/dL   Creatinine, Ser 0.60 0.50 - 1.10 mg/dL   Calcium 8.8 8.4 - 10.5 mg/dL   Total Protein 6.8 6.0 - 8.3 g/dL   Albumin 4.2 3.5 - 5.2 g/dL   AST 122 (H) 0 - 37 U/L   ALT 64 (H) 0 - 35 U/L   Alkaline Phosphatase 71 39 - 117 U/L   Total Bilirubin 0.7 0.3 - 1.2 mg/dL   GFR calc non Af Amer 75 (L) >90 mL/min   GFR calc Af Amer 86 (L) >90 mL/min    Comment: (NOTE) The eGFR has been calculated using the CKD EPI equation. This calculation has not been validated in all clinical  situations. eGFR's persistently <90 mL/min signify possible Chronic Kidney Disease.    Anion gap 6 5 - 15  Urinalysis, Routine w reflex microscopic     Status: Abnormal   Collection Time: 11/17/14  2:13 PM  Result Value Ref Range   Color, Urine YELLOW YELLOW   APPearance CLEAR CLEAR   Specific Gravity, Urine 1.020 1.005 - 1.030   pH 6.0 5.0 - 8.0   Glucose, UA NEGATIVE NEGATIVE mg/dL   Hgb urine dipstick NEGATIVE NEGATIVE   Bilirubin Urine NEGATIVE NEGATIVE   Ketones, ur NEGATIVE NEGATIVE mg/dL   Protein, ur NEGATIVE NEGATIVE mg/dL   Urobilinogen, UA 0.2 0.0 - 1.0 mg/dL   Nitrite NEGATIVE NEGATIVE   Leukocytes, UA SMALL (A) NEGATIVE  Urine microscopic-add on     Status: Abnormal   Collection Time: 11/17/14  2:13 PM  Result Value Ref Range   Squamous Epithelial / LPF RARE RARE   WBC, UA 7-10 <3 WBC/hpf   Bacteria, UA FEW (A) RARE   Dg Ribs Unilateral W/chest Right  11/17/2014   CLINICAL DATA:  Fall with right rib and shoulder pain. Initial encounter.  EXAM: RIGHT RIBS AND CHEST - 3+ VIEW  COMPARISON:  02/06/2013  FINDINGS: Acute fractures of the right sixth, seventh, and eighth ribs with mild displacement. There is associated air leak with soft tissue gas and a small right apical pneumothorax (less than 10%).  No cardiomegaly or aortic contour abnormality. There is chronic interstitial coarsening without evidence of contusion. No evidence for hemothorax.  Critical Value/emergent results were called by telephone at the time of interpretation on 11/17/2014 at 1:14 pm to Dr. Fredia Sorrow , who verbally acknowledged these results.  IMPRESSION: 1. Small right apical pneumothorax. 2. Right 6th, 7th, and 8th rib fractures.   Electronically Signed   By: Monte Fantasia M.D.   On: 11/17/2014 13:14   Dg Shoulder Right  11/17/2014   CLINICAL DATA:  Right rib and right shoulder pain.  EXAM: RIGHT SHOULDER - 2+ VIEW  COMPARISON:  06/17/2015 and rib examination 11/17/2014  FINDINGS: The right  humeral head is elevated with relation to the right acromion. Findings suggest underlying rotator cuff disease or tear. Mild irregularity at the right [facility] joint consistent with degenerative changes.  There is a displaced fracture involving the right seventh rib. The right shoulder appears to be located but the scapular Y-view is limited. No acute fracture involving the right shoulder.  IMPRESSION: Right seventh rib fracture.  No acute bone abnormality involving the right shoulder. The right humeral head is elevated and raises concern for underlying rotator cuff disease.   Electronically Signed   By: Markus Daft M.D.   On: 11/17/2014 13:14   Ct Head Wo Contrast  11/17/2014   CLINICAL DATA:  [place] off the toilet around midnight hitting right side, now with right-sided soreness. History of thyroid cancer. Initial encounter.  EXAM: CT HEAD WITHOUT CONTRAST  CT CERVICAL SPINE WITHOUT CONTRAST  TECHNIQUE: Multidetector CT imaging of the head and cervical spine was performed following the standard protocol without intravenous contrast. Multiplanar CT image reconstructions of the cervical spine were also generated.  COMPARISON:  Head CT - 07/22/2013; 02/06/2013; cervical spine CT -02/06/2013  FINDINGS: CT HEAD FINDINGS  Similar findings of atrophy with sulcal prominence and prominence of the bifrontal extra-axial spaces. Grossly unchanged rather extensive periventricular hypodensities compatible with microvascular ischemic disease. The gray-white differentiation is otherwise well maintained without CT evidence of acute large territory infarct. No intraparenchymal or extra-axial mass or hemorrhage. Unchanged size and configuration of the ventricles and cisterns. No shift. Intracranial atherosclerosis.  Limited visualization of the paranasal sinuses and mastoid air cells is normal. Regional soft tissues appear normal. No displaced calvarial fracture.  CT CERVICAL SPINE FINDINGS  C1 to the superior endplate of T3 is imaged.   There is minimal (approximately 1-2 mm) of anterolisthesis of C4 upon C5. There is minimal (approximately 2-3 mm) of retrolisthesis of C5 upon C6. There is unchanged straightening and slight reversal of the expected cervical lordosis with mild kyphosis centered about the C4-C5 articulation. The dens is normally positioned between the lateral masses of C1. Normal atlantodental and atlantoaxial articulations.  No fracture or static subluxation of the cervical spine. Cervical vertebral body heights are preserved. Prevertebral soft tissues are normal.  Moderate severe multilevel cervical spine in DDD, worse at C5-C6 with disc space height loss, endplate irregularity and small posteriorly directed disc osteophyte complex at this location. Posteriorly directed disc osteophyte complexes are also seen at C3-C4 and C4-C5.  There is unchanged partial ossification of the posterior longitudinal ligament / interspinous ligament between the C3-C4 and C4-C5 spinous processes (sagittal image 34, series 7). There is also unchanged ossification of the nuchal ligament posterior to the C5 spinous process.  Atherosclerotic plaque with the bilateral carotid bulbs. No bulky cervical lymphadenopathy on this noncontrast examination. The thyroid is surgically absent.  Limited visualization of the lung apices demonstrates a trace right apical pneumothorax (representative images 63 and 77, series 5).  IMPRESSION: 1. Similar findings of atrophy and microvascular ischemic disease without acute intracranial process. 2. No fracture or static subluxation of the cervical spine. 3. Moderate to severe multilevel cervical spine DDD, worse at C5-C6. 4. Re- demonstrated trace right apical pneumothorax as was seen on chest radiograph performed earlier same day.   Electronically Signed   By: Sandi Mariscal M.D.   On: 11/17/2014 15:47   Ct Cervical Spine Wo Contrast  11/17/2014   CLINICAL DATA:  [place] off the toilet around midnight hitting right side, now  with right-sided soreness. History of thyroid cancer. Initial encounter.  EXAM: CT HEAD WITHOUT CONTRAST  CT CERVICAL SPINE WITHOUT CONTRAST  TECHNIQUE: Multidetector CT imaging of the head and cervical spine was performed following the  standard protocol without intravenous contrast. Multiplanar CT image reconstructions of the cervical spine were also generated.  COMPARISON:  Head CT - 07/22/2013; 02/06/2013; cervical spine CT -02/06/2013  FINDINGS: CT HEAD FINDINGS  Similar findings of atrophy with sulcal prominence and prominence of the bifrontal extra-axial spaces. Grossly unchanged rather extensive periventricular hypodensities compatible with microvascular ischemic disease. The gray-white differentiation is otherwise well maintained without CT evidence of acute large territory infarct. No intraparenchymal or extra-axial mass or hemorrhage. Unchanged size and configuration of the ventricles and cisterns. No shift. Intracranial atherosclerosis.  Limited visualization of the paranasal sinuses and mastoid air cells is normal. Regional soft tissues appear normal. No displaced calvarial fracture.  CT CERVICAL SPINE FINDINGS  C1 to the superior endplate of T3 is imaged.  There is minimal (approximately 1-2 mm) of anterolisthesis of C4 upon C5. There is minimal (approximately 2-3 mm) of retrolisthesis of C5 upon C6. There is unchanged straightening and slight reversal of the expected cervical lordosis with mild kyphosis centered about the C4-C5 articulation. The dens is normally positioned between the lateral masses of C1. Normal atlantodental and atlantoaxial articulations.  No fracture or static subluxation of the cervical spine. Cervical vertebral body heights are preserved. Prevertebral soft tissues are normal.  Moderate severe multilevel cervical spine in DDD, worse at C5-C6 with disc space height loss, endplate irregularity and small posteriorly directed disc osteophyte complex at this location. Posteriorly  directed disc osteophyte complexes are also seen at C3-C4 and C4-C5.  There is unchanged partial ossification of the posterior longitudinal ligament / interspinous ligament between the C3-C4 and C4-C5 spinous processes (sagittal image 34, series 7). There is also unchanged ossification of the nuchal ligament posterior to the C5 spinous process.  Atherosclerotic plaque with the bilateral carotid bulbs. No bulky cervical lymphadenopathy on this noncontrast examination. The thyroid is surgically absent.  Limited visualization of the lung apices demonstrates a trace right apical pneumothorax (representative images 63 and 77, series 5).  IMPRESSION: 1. Similar findings of atrophy and microvascular ischemic disease without acute intracranial process. 2. No fracture or static subluxation of the cervical spine. 3. Moderate to severe multilevel cervical spine DDD, worse at C5-C6. 4. Re- demonstrated trace right apical pneumothorax as was seen on chest radiograph performed earlier same day.   Electronically Signed   By: Sandi Mariscal M.D.   On: 11/17/2014 15:47   Dg Chest Port 1 View  11/17/2014   CLINICAL DATA:  81 year old female fell in the bathroom. Right pneumothorax. Initial encounter.  EXAM: PORTABLE CHEST - 1 VIEW  COMPARISON:  1238 hr today and earlier.  FINDINGS: Portable AP semi upright view at 1745 hrs. Increased right chest wall subcutaneous emphysema. However, the previously seen right pneumothorax is no longer visible.  No mediastinal shift. Stable cardiac size and mediastinal contours. Visualized tracheal air column is within normal limits. No pulmonary edema or pleural effusion identified. Stable left lung. Six seventh and eighth rib fractures were better demonstrated on the comparison earlier today.  IMPRESSION: 1. Increased right chest wall subcutaneous emphysema, but the small right pneumothorax is no longer radiographically apparent. 2. No new cardiopulmonary abnormality. 3. Right rib fractures better  demonstrated on earlier rib series.   Electronically Signed   By: Genevie Ann M.D.   On: 11/17/2014 17:55    Review of Systems  All other systems reviewed and are negative.   Blood pressure 130/72, pulse 81, temperature 98.1 F (36.7 C), temperature source Oral, resp. rate 18, height '4\' 11"'  (1.499 m),  weight 93 lb (42.185 kg), SpO2 95 %. Physical Exam  Constitutional: She is oriented to person, place, and time. She appears well-developed. No distress.  HENT:  Head: Normocephalic and atraumatic.  Right Ear: External ear normal.  Left Ear: External ear normal.  Nose: Nose normal.  Mouth/Throat: Oropharynx is clear and moist. No oropharyngeal exudate.  Diminished hearing  Eyes: Conjunctivae are normal. Pupils are equal, round, and reactive to light. No scleral icterus.  Neck: Normal range of motion. Neck supple. No tracheal deviation present.  Cervical spine is nontender  Cardiovascular: Normal rate, regular rhythm and normal heart sounds.   No murmur heard. Respiratory: Effort normal and breath sounds normal. No respiratory distress. She exhibits tenderness.  There is tenderness on the right lateral chest  GI: Soft. She exhibits no distension. There is no tenderness.  Musculoskeletal: Normal range of motion. She exhibits no edema or tenderness.  Neurological: She is alert and oriented to person, place, and time.  Skin: Skin is warm and dry. No rash noted. She is not diaphoretic. No erythema.  Psychiatric: Her behavior is normal. Judgment normal.     Assessment/Plan 81 year old female status post ground-level fall with multiple right rib fractures and a small apical right pneumothorax  Her cervical spine CT scan and CT of the head showed no acute injuries. Her follow-up chest x-ray since transfer does not show the right pneumothorax now but does confirm the rib fractures and now increasing subcutaneous emphysema. I explained the diagnosis to her. I explained the risks of further collapse  of her lung as well as the risk of pneumonia in someone her age with multiple rib fractures and splinting. She'll be admitted to the stepdown unit for close pulmonary monitoring. She will be placed on a sliding scale insulin for her diabetes. Her chest x-ray will be repeated in the morning. PT and OT will be see eventually as a consult  Ketrina Boateng A 11/17/2014, 6:00 PM   Procedures

## 2014-11-17 NOTE — ED Notes (Signed)
Dr. Blackman at bedside. 

## 2014-11-17 NOTE — ED Notes (Signed)
Diet tray ordered for pt 

## 2014-11-18 ENCOUNTER — Inpatient Hospital Stay (HOSPITAL_COMMUNITY): Payer: Medicare Other

## 2014-11-18 ENCOUNTER — Encounter (HOSPITAL_COMMUNITY): Payer: Self-pay

## 2014-11-18 LAB — GLUCOSE, CAPILLARY
GLUCOSE-CAPILLARY: 132 mg/dL — AB (ref 70–99)
Glucose-Capillary: 111 mg/dL — ABNORMAL HIGH (ref 70–99)
Glucose-Capillary: 138 mg/dL — ABNORMAL HIGH (ref 70–99)
Glucose-Capillary: 116 mg/dL — ABNORMAL HIGH (ref 70–99)

## 2014-11-18 LAB — BASIC METABOLIC PANEL
Anion gap: 7 (ref 5–15)
BUN: 9 mg/dL (ref 6–23)
CO2: 26 mmol/L (ref 19–32)
CREATININE: 0.67 mg/dL (ref 0.50–1.10)
Calcium: 8 mg/dL — ABNORMAL LOW (ref 8.4–10.5)
Chloride: 108 mmol/L (ref 96–112)
GFR calc Af Amer: 83 mL/min — ABNORMAL LOW (ref >90)
GFR calc non Af Amer: 72 mL/min — ABNORMAL LOW (ref >90)
Glucose, Bld: 104 mg/dL — ABNORMAL HIGH (ref 70–99)
Potassium: 3.5 mmol/L (ref 3.5–5.1)
Sodium: 141 mmol/L (ref 135–145)

## 2014-11-18 LAB — CBC
HEMATOCRIT: 28.7 % — AB (ref 36.0–46.0)
HEMATOCRIT: 32.4 % — AB (ref 36.0–46.0)
HEMOGLOBIN: 9.7 g/dL — AB (ref 12.0–15.0)
HEMOGLOBIN: 10.8 g/dL — AB (ref 12.0–15.0)
MCH: 30.1 pg (ref 26.0–34.0)
MCH: 29.9 pg (ref 26.0–34.0)
MCHC: 33.8 g/dL (ref 30.0–36.0)
MCHC: 33.3 g/dL (ref 30.0–36.0)
MCV: 89.1 fL (ref 78.0–100.0)
MCV: 89.8 fL (ref 78.0–100.0)
PLATELETS: 146 10*3/uL — AB (ref 150–400)
Platelets: 123 10*3/uL — ABNORMAL LOW (ref 150–400)
RBC: 3.22 MIL/uL — ABNORMAL LOW (ref 3.87–5.11)
RBC: 3.61 MIL/uL — AB (ref 3.87–5.11)
RDW: 14.3 % (ref 11.5–15.5)
RDW: 14.4 % (ref 11.5–15.5)
WBC: 5.6 10*3/uL (ref 4.0–10.5)
WBC: 7.5 10*3/uL (ref 4.0–10.5)

## 2014-11-18 LAB — MRSA PCR SCREENING: MRSA by PCR: POSITIVE — AB

## 2014-11-18 MED ORDER — TRAMADOL HCL 50 MG PO TABS
50.0000 mg | ORAL_TABLET | Freq: Four times a day (QID) | ORAL | Status: DC
  Administered 2014-11-18 – 2014-11-21 (×10): 50 mg via ORAL
  Filled 2014-11-18 (×10): qty 1

## 2014-11-18 MED ORDER — CHLORHEXIDINE GLUCONATE CLOTH 2 % EX PADS
6.0000 | MEDICATED_PAD | Freq: Every day | CUTANEOUS | Status: DC
  Administered 2014-11-18 – 2014-11-19 (×2): 6 via TOPICAL

## 2014-11-18 MED ORDER — MUPIROCIN 2 % EX OINT
1.0000 "application " | TOPICAL_OINTMENT | Freq: Two times a day (BID) | CUTANEOUS | Status: DC
  Administered 2014-11-18 – 2014-11-19 (×4): 1 via NASAL
  Filled 2014-11-18 (×2): qty 22

## 2014-11-18 MED ORDER — IOHEXOL 300 MG/ML  SOLN
100.0000 mL | Freq: Once | INTRAMUSCULAR | Status: AC | PRN
  Administered 2014-11-18: 100 mL via INTRAVENOUS

## 2014-11-18 NOTE — Progress Notes (Signed)
Pt placed on Contact precautions as MRSA swab tested positive.

## 2014-11-18 NOTE — Evaluation (Signed)
Physical Therapy Evaluation Patient Details Name: Kara Santos MRN: [id number] DOB: June 06, 1917 Today's Date: 11/18/2014   History of Present Illness  pt fell from toilet at home with right sided rib fx  Clinical Impression  Pt is very independent at baseline lives alone and still driving. Pt eager to get up and walk. Pt moving well with difficulty with bed mobility and safety with gait. Pt lives next door to dgtr but states she could live with her at D/C. Pt states her fall happened so fast she wasn't sure how. For safety recommend she no longer live alone but live with family with continued use of RW for gait. Pt will benefit from acute therapy to maximize mobility, function, gait and balance to decrease burden of care and fall risk.     Follow Up Recommendations Home health PT;Supervision for mobility/OOB    Equipment Recommendations  None recommended by PT    Recommendations for Other Services OT consult     Precautions / Restrictions Precautions Precautions: Fall      Mobility  Bed Mobility Overal bed mobility: Needs Assistance Bed Mobility: Sidelying to Sit;Sit to Sidelying;Rolling Rolling: Min assist Sidelying to sit: Min assist     Sit to sidelying: Min assist General bed mobility comments: cues for sequence with assist to elevate trunk and control trunk to surface  Transfers Overall transfer level: Needs assistance   Transfers: Sit to/from Stand Sit to Stand: Supervision         General transfer comment: cues for hand placement and safety  Ambulation/Gait Ambulation/Gait assistance: Supervision Ambulation Distance (Feet): 300 Feet Assistive device: Rolling walker (2 wheeled) Gait Pattern/deviations: Step-through pattern;Decreased stride length   Gait velocity interpretation: at or above normal speed for age/gender General Gait Details: cues for position in Rw and directional cues  Stairs            Wheelchair Mobility    Modified Rankin (Stroke  Patients Only)       Balance Overall balance assessment: Needs assistance   Sitting balance-Leahy Scale: Good       Standing balance-Leahy Scale: Fair                               Pertinent Vitals/Pain Pain Assessment: 0-10 Pain Score: 5  Pain Location: right chest Pain Descriptors / Indicators: Sore Pain Intervention(s): Repositioned  HR 78 sats 92-96% RA    Home Living Family/patient expects to be discharged to:: Private residence Living Arrangements: Alone Available Help at Discharge: Family;Available 24 hours/day Type of Home: House Home Access: Stairs to enter Entrance Stairs-Rails: Right Entrance Stairs-Number of Steps: 5 Home Layout: One level Home Equipment: Walker - 2 wheels;Cane - single point      Prior Function Level of Independence: Independent with assistive device(s)         Comments: pt states she uses Rw and cane sometimes but not consistently in the home     Hand Dominance        Extremity/Trunk Assessment   Upper Extremity Assessment: Generalized weakness           Lower Extremity Assessment: Generalized weakness      Cervical / Trunk Assessment: Normal  Communication   Communication: No difficulties  Cognition Arousal/Alertness: Awake/alert Behavior During Therapy: WFL for tasks assessed/performed Overall Cognitive Status: Impaired/Different from baseline Area of Impairment: Orientation Orientation Level: Time  General Comments      Exercises        Assessment/Plan    PT Assessment Patient needs continued PT services  PT Diagnosis Acute pain   PT Problem List Decreased mobility;Decreased knowledge of use of DME  PT Treatment Interventions Gait training;Stair training;Functional mobility training;Therapeutic activities;Patient/family education;DME instruction   PT Goals (Current goals can be found in the Care Plan section) Acute Rehab PT Goals Patient Stated Goal: return  home PT Goal Formulation: With patient Time For Goal Achievement: 11/25/14 Potential to Achieve Goals: Good    Frequency Min 3X/week   Barriers to discharge        Co-evaluation               End of Session   Activity Tolerance: Patient tolerated treatment well Patient left: in chair;with call bell/phone within reach;with nursing/sitter in room Nurse Communication: Mobility status         Time: 0962-8366 PT Time Calculation (min) (ACUTE ONLY): 19 min   Charges:   PT Evaluation $Initial PT Evaluation Tier I: 1 Procedure     PT G CodesMelford Aase 11/18/2014, 12:04 PM Elwyn Reach, [place]

## 2014-11-18 NOTE — Progress Notes (Addendum)
Trauma Service Note  Subjective: Patient is awake and alert.  Seems to be a bit forgetful and disoriented to time.  Objective: Vital signs in last 24 hours: Temp:  [97.6 F (36.4 C)-99.3 F (37.4 C)] 99.3 F (37.4 C) (02/13 0700) Pulse Rate:  [72-89] 79 (02/13 0330) Resp:  [12-21] 17 (02/13 0330) BP: (96-163)/(54-80) 116/58 mmHg (02/13 0330) SpO2:  [95 %-100 %] 97 % (02/13 0330) Weight:  [42.185 kg (93 lb)-44.5 kg (98 lb 1.7 oz)] 44.5 kg (98 lb 1.7 oz) (02/12 1926) Last BM Date: 11/17/14  Intake/Output from previous day: 02/12 0701 - 02/13 0700 In: 550.8 [P.O.:60; I.V.:490.8] Out: 500 [Urine:500] Intake/Output this shift:    General: Mild acute distress with discomfort and pain on her right chest wall  Lungs: Clear.  No palpable crepitance.  CXR shows continued, but not increasing, right subcutaneous air.  Right lateral small PTX.  No hemothorax noted.  Abd: Soft, good bowel sounds.  Extremities: No changes.  Complains of right shoulder pain, but really is right chest wall pain when she moves her right shoulder.  Neuro: Forgetful.  Lab Results: CBC   Recent Labs  11/17/14 1343 11/18/14 0329  WBC 8.9 5.6  HGB 12.1 9.7*  HCT 35.6* 28.7*  PLT 139* 123*   BMET  Recent Labs  11/17/14 1343 11/18/14 0329  NA 139 141  K 3.7 3.5  CL 105 108  CO2 28 26  GLUCOSE 124* 104*  BUN 17 9  CREATININE 0.60 0.67  CALCIUM 8.8 8.0*   PT/INR No results for input(s): LABPROT, INR in the last 72 hours. ABG No results for input(s): PHART, HCO3 in the last 72 hours.  Invalid input(s): PCO2, PO2  Studies/Results: Dg Ribs Unilateral W/chest Right  11/17/2014   CLINICAL DATA:  Fall with right rib and shoulder pain. Initial encounter.  EXAM: RIGHT RIBS AND CHEST - 3+ VIEW  COMPARISON:  02/06/2013  FINDINGS: Acute fractures of the right sixth, seventh, and eighth ribs with mild displacement. There is associated air leak with soft tissue gas and a small right apical pneumothorax  (less than 10%).  No cardiomegaly or aortic contour abnormality. There is chronic interstitial coarsening without evidence of contusion. No evidence for hemothorax.  Critical Value/emergent results were called by telephone at the time of interpretation on 11/17/2014 at 1:14 pm to Dr. Fredia Sorrow , who verbally acknowledged these results.  IMPRESSION: 1. Small right apical pneumothorax. 2. Right 6th, 7th, and 8th rib fractures.   Electronically Signed   By: Monte Fantasia M.D.   On: 11/17/2014 13:14   Dg Shoulder Right  11/17/2014   CLINICAL DATA:  Right rib and right shoulder pain.  EXAM: RIGHT SHOULDER - 2+ VIEW  COMPARISON:  06/17/2015 and rib examination 11/17/2014  FINDINGS: The right humeral head is elevated with relation to the right acromion. Findings suggest underlying rotator cuff disease or tear. Mild irregularity at the right [facility] joint consistent with degenerative changes. There is a displaced fracture involving the right seventh rib. The right shoulder appears to be located but the scapular Y-view is limited. No acute fracture involving the right shoulder.  IMPRESSION: Right seventh rib fracture.  No acute bone abnormality involving the right shoulder. The right humeral head is elevated and raises concern for underlying rotator cuff disease.   Electronically Signed   By: Markus Daft M.D.   On: 11/17/2014 13:14   Ct Head Wo Contrast  11/17/2014   CLINICAL DATA:  [place] off the toilet around  midnight hitting right side, now with right-sided soreness. History of thyroid cancer. Initial encounter.  EXAM: CT HEAD WITHOUT CONTRAST  CT CERVICAL SPINE WITHOUT CONTRAST  TECHNIQUE: Multidetector CT imaging of the head and cervical spine was performed following the standard protocol without intravenous contrast. Multiplanar CT image reconstructions of the cervical spine were also generated.  COMPARISON:  Head CT - 07/22/2013; 02/06/2013; cervical spine CT -02/06/2013  FINDINGS: CT HEAD FINDINGS  Similar  findings of atrophy with sulcal prominence and prominence of the bifrontal extra-axial spaces. Grossly unchanged rather extensive periventricular hypodensities compatible with microvascular ischemic disease. The gray-white differentiation is otherwise well maintained without CT evidence of acute large territory infarct. No intraparenchymal or extra-axial mass or hemorrhage. Unchanged size and configuration of the ventricles and cisterns. No shift. Intracranial atherosclerosis.  Limited visualization of the paranasal sinuses and mastoid air cells is normal. Regional soft tissues appear normal. No displaced calvarial fracture.  CT CERVICAL SPINE FINDINGS  C1 to the superior endplate of T3 is imaged.  There is minimal (approximately 1-2 mm) of anterolisthesis of C4 upon C5. There is minimal (approximately 2-3 mm) of retrolisthesis of C5 upon C6. There is unchanged straightening and slight reversal of the expected cervical lordosis with mild kyphosis centered about the C4-C5 articulation. The dens is normally positioned between the lateral masses of C1. Normal atlantodental and atlantoaxial articulations.  No fracture or static subluxation of the cervical spine. Cervical vertebral body heights are preserved. Prevertebral soft tissues are normal.  Moderate severe multilevel cervical spine in DDD, worse at C5-C6 with disc space height loss, endplate irregularity and small posteriorly directed disc osteophyte complex at this location. Posteriorly directed disc osteophyte complexes are also seen at C3-C4 and C4-C5.  There is unchanged partial ossification of the posterior longitudinal ligament / interspinous ligament between the C3-C4 and C4-C5 spinous processes (sagittal image 34, series 7). There is also unchanged ossification of the nuchal ligament posterior to the C5 spinous process.  Atherosclerotic plaque with the bilateral carotid bulbs. No bulky cervical lymphadenopathy on this noncontrast examination. The thyroid  is surgically absent.  Limited visualization of the lung apices demonstrates a trace right apical pneumothorax (representative images 63 and 77, series 5).  IMPRESSION: 1. Similar findings of atrophy and microvascular ischemic disease without acute intracranial process. 2. No fracture or static subluxation of the cervical spine. 3. Moderate to severe multilevel cervical spine DDD, worse at C5-C6. 4. Re- demonstrated trace right apical pneumothorax as was seen on chest radiograph performed earlier same day.   Electronically Signed   By: Sandi Mariscal M.D.   On: 11/17/2014 15:47   Ct Cervical Spine Wo Contrast  11/17/2014   CLINICAL DATA:  [place] off the toilet around midnight hitting right side, now with right-sided soreness. History of thyroid cancer. Initial encounter.  EXAM: CT HEAD WITHOUT CONTRAST  CT CERVICAL SPINE WITHOUT CONTRAST  TECHNIQUE: Multidetector CT imaging of the head and cervical spine was performed following the standard protocol without intravenous contrast. Multiplanar CT image reconstructions of the cervical spine were also generated.  COMPARISON:  Head CT - 07/22/2013; 02/06/2013; cervical spine CT -02/06/2013  FINDINGS: CT HEAD FINDINGS  Similar findings of atrophy with sulcal prominence and prominence of the bifrontal extra-axial spaces. Grossly unchanged rather extensive periventricular hypodensities compatible with microvascular ischemic disease. The gray-white differentiation is otherwise well maintained without CT evidence of acute large territory infarct. No intraparenchymal or extra-axial mass or hemorrhage. Unchanged size and configuration of the ventricles and cisterns. No shift. Intracranial  atherosclerosis.  Limited visualization of the paranasal sinuses and mastoid air cells is normal. Regional soft tissues appear normal. No displaced calvarial fracture.  CT CERVICAL SPINE FINDINGS  C1 to the superior endplate of T3 is imaged.  There is minimal (approximately 1-2 mm) of  anterolisthesis of C4 upon C5. There is minimal (approximately 2-3 mm) of retrolisthesis of C5 upon C6. There is unchanged straightening and slight reversal of the expected cervical lordosis with mild kyphosis centered about the C4-C5 articulation. The dens is normally positioned between the lateral masses of C1. Normal atlantodental and atlantoaxial articulations.  No fracture or static subluxation of the cervical spine. Cervical vertebral body heights are preserved. Prevertebral soft tissues are normal.  Moderate severe multilevel cervical spine in DDD, worse at C5-C6 with disc space height loss, endplate irregularity and small posteriorly directed disc osteophyte complex at this location. Posteriorly directed disc osteophyte complexes are also seen at C3-C4 and C4-C5.  There is unchanged partial ossification of the posterior longitudinal ligament / interspinous ligament between the C3-C4 and C4-C5 spinous processes (sagittal image 34, series 7). There is also unchanged ossification of the nuchal ligament posterior to the C5 spinous process.  Atherosclerotic plaque with the bilateral carotid bulbs. No bulky cervical lymphadenopathy on this noncontrast examination. The thyroid is surgically absent.  Limited visualization of the lung apices demonstrates a trace right apical pneumothorax (representative images 63 and 77, series 5).  IMPRESSION: 1. Similar findings of atrophy and microvascular ischemic disease without acute intracranial process. 2. No fracture or static subluxation of the cervical spine. 3. Moderate to severe multilevel cervical spine DDD, worse at C5-C6. 4. Re- demonstrated trace right apical pneumothorax as was seen on chest radiograph performed earlier same day.   Electronically Signed   By: Sandi Mariscal M.D.   On: 11/17/2014 15:47   Dg Chest Port 1 View  11/17/2014   CLINICAL DATA:  81 year old female fell in the bathroom. Right pneumothorax. Initial encounter.  EXAM: PORTABLE CHEST - 1 VIEW   COMPARISON:  1238 hr today and earlier.  FINDINGS: Portable AP semi upright view at 1745 hrs. Increased right chest wall subcutaneous emphysema. However, the previously seen right pneumothorax is no longer visible.  No mediastinal shift. Stable cardiac size and mediastinal contours. Visualized tracheal air column is within normal limits. No pulmonary edema or pleural effusion identified. Stable left lung. Six seventh and eighth rib fractures were better demonstrated on the comparison earlier today.  IMPRESSION: 1. Increased right chest wall subcutaneous emphysema, but the small right pneumothorax is no longer radiographically apparent. 2. No new cardiopulmonary abnormality. 3. Right rib fractures better demonstrated on earlier rib series.   Electronically Signed   By: Genevie Ann M.D.   On: 11/17/2014 17:55    Anti-infectives: Anti-infectives    None      Assessment/Plan: s/p  Advance diet Patient's hemoglobin dropped 2.5 grams from the time of original presentation.  Possible that she has a liver laceration underneath the right rib fractures.  With this possibility, will get CT abd/pelvis to rule this out.  Acute blood loss anemia Will advance diet after CT is done.  LOS: 1 day   Kathryne Eriksson. Dahlia Bailiff, MD, FACS [phone] Trauma Surgeon 11/18/2014

## 2014-11-19 LAB — CBC WITH DIFFERENTIAL/PLATELET
BASOS ABS: 0 10*3/uL (ref 0.0–0.1)
BASOS PCT: 0 % (ref 0–1)
Eosinophils Absolute: 0.1 10*3/uL (ref 0.0–0.7)
Eosinophils Relative: 2 % (ref 0–5)
HEMATOCRIT: 28.6 % — AB (ref 36.0–46.0)
Hemoglobin: 9.7 g/dL — ABNORMAL LOW (ref 12.0–15.0)
LYMPHS PCT: 23 % (ref 12–46)
Lymphs Abs: 1.2 10*3/uL (ref 0.7–4.0)
MCH: 30.3 pg (ref 26.0–34.0)
MCHC: 33.9 g/dL (ref 30.0–36.0)
MCV: 89.4 fL (ref 78.0–100.0)
Monocytes Absolute: 0.6 10*3/uL (ref 0.1–1.0)
Monocytes Relative: 12 % (ref 3–12)
NEUTROS PCT: 63 % (ref 43–77)
Neutro Abs: 3.2 10*3/uL (ref 1.7–7.7)
Platelets: 122 10*3/uL — ABNORMAL LOW (ref 150–400)
RBC: 3.2 MIL/uL — AB (ref 3.87–5.11)
RDW: 14.4 % (ref 11.5–15.5)
WBC: 5.2 10*3/uL (ref 4.0–10.5)

## 2014-11-19 LAB — GLUCOSE, CAPILLARY
GLUCOSE-CAPILLARY: 107 mg/dL — AB (ref 70–99)
GLUCOSE-CAPILLARY: 134 mg/dL — AB (ref 70–99)
Glucose-Capillary: 137 mg/dL — ABNORMAL HIGH (ref 70–99)
Glucose-Capillary: 122 mg/dL — ABNORMAL HIGH (ref 70–99)

## 2014-11-19 MED ORDER — SODIUM CHLORIDE 0.9 % IJ SOLN: 3.0000 mL | INTRAMUSCULAR | Status: DC | PRN

## 2014-11-19 NOTE — Progress Notes (Signed)
Trauma Service Note  Subjective: Patient doing better today.  Objective: Vital signs in last 24 hours: Temp:  [98.2 F (36.8 C)-98.4 F (36.9 C)] 98.3 F (36.8 C) (02/14 0505) Pulse Rate:  [76-91] 76 (02/14 0505) Resp:  [14-22] 14 (02/14 0505) BP: (98-143)/(44-56) 98/47 mmHg (02/14 0505) SpO2:  [92 %-97 %] 97 % (02/14 0505) Last BM Date: 11/17/14  Intake/Output from previous day: 02/13 0701 - 02/14 0700 In: 725 [P.O.:495; I.V.:230] Out: 500 [Urine:500] Intake/Output this shift:    General: No caute distress  Lungs: Clear to auscultation  Abd: Benign  Extremities: No changes  Neuro: Intact  Lab Results: CBC   Recent Labs  11/18/14 1540 11/19/14 0252  WBC 7.5 5.2  HGB 10.8* 9.7*  HCT 32.4* 28.6*  PLT 146* 122*   BMET  Recent Labs  11/17/14 1343 11/18/14 0329  NA 139 141  K 3.7 3.5  CL 105 108  CO2 28 26  GLUCOSE 124* 104*  BUN 17 9  CREATININE 0.60 0.67  CALCIUM 8.8 8.0*   PT/INR No results for input(s): LABPROT, INR in the last 72 hours. ABG No results for input(s): PHART, HCO3 in the last 72 hours.  Invalid input(s): PCO2, PO2  Studies/Results: Dg Ribs Unilateral W/chest Right  11/17/2014   CLINICAL DATA:  Fall with right rib and shoulder pain. Initial encounter.  EXAM: RIGHT RIBS AND CHEST - 3+ VIEW  COMPARISON:  02/06/2013  FINDINGS: Acute fractures of the right sixth, seventh, and eighth ribs with mild displacement. There is associated air leak with soft tissue gas and a small right apical pneumothorax (less than 10%).  No cardiomegaly or aortic contour abnormality. There is chronic interstitial coarsening without evidence of contusion. No evidence for hemothorax.  Critical Value/emergent results were called by telephone at the time of interpretation on 11/17/2014 at 1:14 pm to Dr. Fredia Sorrow , who verbally acknowledged these results.  IMPRESSION: 1. Small right apical pneumothorax. 2. Right 6th, 7th, and 8th rib fractures.   Electronically  Signed   By: Monte Fantasia M.D.   On: 11/17/2014 13:14   Dg Shoulder Right  11/17/2014   CLINICAL DATA:  Right rib and right shoulder pain.  EXAM: RIGHT SHOULDER - 2+ VIEW  COMPARISON:  06/17/2015 and rib examination 11/17/2014  FINDINGS: The right humeral head is elevated with relation to the right acromion. Findings suggest underlying rotator cuff disease or tear. Mild irregularity at the right [facility] joint consistent with degenerative changes. There is a displaced fracture involving the right seventh rib. The right shoulder appears to be located but the scapular Y-view is limited. No acute fracture involving the right shoulder.  IMPRESSION: Right seventh rib fracture.  No acute bone abnormality involving the right shoulder. The right humeral head is elevated and raises concern for underlying rotator cuff disease.   Electronically Signed   By: Markus Daft M.D.   On: 11/17/2014 13:14   Ct Head Wo Contrast  11/17/2014   CLINICAL DATA:  [place] off the toilet around midnight hitting right side, now with right-sided soreness. History of thyroid cancer. Initial encounter.  EXAM: CT HEAD WITHOUT CONTRAST  CT CERVICAL SPINE WITHOUT CONTRAST  TECHNIQUE: Multidetector CT imaging of the head and cervical spine was performed following the standard protocol without intravenous contrast. Multiplanar CT image reconstructions of the cervical spine were also generated.  COMPARISON:  Head CT - 07/22/2013; 02/06/2013; cervical spine CT -02/06/2013  FINDINGS: CT HEAD FINDINGS  Similar findings of atrophy with sulcal prominence and  prominence of the bifrontal extra-axial spaces. Grossly unchanged rather extensive periventricular hypodensities compatible with microvascular ischemic disease. The gray-white differentiation is otherwise well maintained without CT evidence of acute large territory infarct. No intraparenchymal or extra-axial mass or hemorrhage. Unchanged size and configuration of the ventricles and cisterns. No shift.  Intracranial atherosclerosis.  Limited visualization of the paranasal sinuses and mastoid air cells is normal. Regional soft tissues appear normal. No displaced calvarial fracture.  CT CERVICAL SPINE FINDINGS  C1 to the superior endplate of T3 is imaged.  There is minimal (approximately 1-2 mm) of anterolisthesis of C4 upon C5. There is minimal (approximately 2-3 mm) of retrolisthesis of C5 upon C6. There is unchanged straightening and slight reversal of the expected cervical lordosis with mild kyphosis centered about the C4-C5 articulation. The dens is normally positioned between the lateral masses of C1. Normal atlantodental and atlantoaxial articulations.  No fracture or static subluxation of the cervical spine. Cervical vertebral body heights are preserved. Prevertebral soft tissues are normal.  Moderate severe multilevel cervical spine in DDD, worse at C5-C6 with disc space height loss, endplate irregularity and small posteriorly directed disc osteophyte complex at this location. Posteriorly directed disc osteophyte complexes are also seen at C3-C4 and C4-C5.  There is unchanged partial ossification of the posterior longitudinal ligament / interspinous ligament between the C3-C4 and C4-C5 spinous processes (sagittal image 34, series 7). There is also unchanged ossification of the nuchal ligament posterior to the C5 spinous process.  Atherosclerotic plaque with the bilateral carotid bulbs. No bulky cervical lymphadenopathy on this noncontrast examination. The thyroid is surgically absent.  Limited visualization of the lung apices demonstrates a trace right apical pneumothorax (representative images 63 and 77, series 5).  IMPRESSION: 1. Similar findings of atrophy and microvascular ischemic disease without acute intracranial process. 2. No fracture or static subluxation of the cervical spine. 3. Moderate to severe multilevel cervical spine DDD, worse at C5-C6. 4. Re- demonstrated trace right apical pneumothorax  as was seen on chest radiograph performed earlier same day.   Electronically Signed   By: Sandi Mariscal M.D.   On: 11/17/2014 15:47   Ct Cervical Spine Wo Contrast  11/17/2014   CLINICAL DATA:  [place] off the toilet around midnight hitting right side, now with right-sided soreness. History of thyroid cancer. Initial encounter.  EXAM: CT HEAD WITHOUT CONTRAST  CT CERVICAL SPINE WITHOUT CONTRAST  TECHNIQUE: Multidetector CT imaging of the head and cervical spine was performed following the standard protocol without intravenous contrast. Multiplanar CT image reconstructions of the cervical spine were also generated.  COMPARISON:  Head CT - 07/22/2013; 02/06/2013; cervical spine CT -02/06/2013  FINDINGS: CT HEAD FINDINGS  Similar findings of atrophy with sulcal prominence and prominence of the bifrontal extra-axial spaces. Grossly unchanged rather extensive periventricular hypodensities compatible with microvascular ischemic disease. The gray-white differentiation is otherwise well maintained without CT evidence of acute large territory infarct. No intraparenchymal or extra-axial mass or hemorrhage. Unchanged size and configuration of the ventricles and cisterns. No shift. Intracranial atherosclerosis.  Limited visualization of the paranasal sinuses and mastoid air cells is normal. Regional soft tissues appear normal. No displaced calvarial fracture.  CT CERVICAL SPINE FINDINGS  C1 to the superior endplate of T3 is imaged.  There is minimal (approximately 1-2 mm) of anterolisthesis of C4 upon C5. There is minimal (approximately 2-3 mm) of retrolisthesis of C5 upon C6. There is unchanged straightening and slight reversal of the expected cervical lordosis with mild kyphosis centered about the C4-C5 articulation.  The dens is normally positioned between the lateral masses of C1. Normal atlantodental and atlantoaxial articulations.  No fracture or static subluxation of the cervical spine. Cervical vertebral body heights are  preserved. Prevertebral soft tissues are normal.  Moderate severe multilevel cervical spine in DDD, worse at C5-C6 with disc space height loss, endplate irregularity and small posteriorly directed disc osteophyte complex at this location. Posteriorly directed disc osteophyte complexes are also seen at C3-C4 and C4-C5.  There is unchanged partial ossification of the posterior longitudinal ligament / interspinous ligament between the C3-C4 and C4-C5 spinous processes (sagittal image 34, series 7). There is also unchanged ossification of the nuchal ligament posterior to the C5 spinous process.  Atherosclerotic plaque with the bilateral carotid bulbs. No bulky cervical lymphadenopathy on this noncontrast examination. The thyroid is surgically absent.  Limited visualization of the lung apices demonstrates a trace right apical pneumothorax (representative images 63 and 77, series 5).  IMPRESSION: 1. Similar findings of atrophy and microvascular ischemic disease without acute intracranial process. 2. No fracture or static subluxation of the cervical spine. 3. Moderate to severe multilevel cervical spine DDD, worse at C5-C6. 4. Re- demonstrated trace right apical pneumothorax as was seen on chest radiograph performed earlier same day.   Electronically Signed   By: Sandi Mariscal M.D.   On: 11/17/2014 15:47   Ct Abdomen Pelvis W Contrast  11/18/2014   CLINICAL DATA:  Acute blood loss anemia. Right rib fracture, evaluate for laceration.  EXAM: CT ABDOMEN AND PELVIS WITH CONTRAST  TECHNIQUE: Multidetector CT imaging of the abdomen and pelvis was performed using the standard protocol following bolus administration of intravenous contrast.  CONTRAST:  140mL OMNIPAQUE IOHEXOL 300 MG/ML  SOLN  COMPARISON:  None.  FINDINGS: BODY WALL: Subcutaneous emphysema along the right lower chest wall related to known rib fracture and air leak. The gas has increased from the admission radiography, but is likely stable from chest x-ray earlier  today.  LOWER CHEST: Known small right basilar pneumothorax. There is a small, layering right pleural effusion. No evidence for hemothorax.  ABDOMEN/PELVIS:  Liver: No focal abnormality.  Biliary: Cholecystectomy.  Pancreas: Unremarkable.  Spleen: Unremarkable.  Adrenals: Unremarkable.  Kidneys and ureters: No hydronephrosis or stone.  Bladder: Unremarkable.  Reproductive: Unremarkable.  Bowel: Large volume formed stool in the distal colon. The proximal colon appears thickened, but there is no inflammatory fat infiltration. There is an abnormal tangle of vessels within the submucosal space of the proximal colon, with prominent Vasa recta. Given patient age and location this finding is consistent with angiodysplasia. This is best visualized in the coronal projection.  Retroperitoneum: No mass or adenopathy.  Peritoneum: No ascites or pneumoperitoneum.  Vascular: No acute abnormality.  OSSEOUS: Mildly displaced posterior right eighth rib fracture. Marked osteopenia.  IMPRESSION: 1. No intra-abdominal explanation for acute blood loss anemia. 2. Small right hydropneumothorax at the base. 3. Probable constipation. There is thickening of the proximal colon which could be from underdistention or colitis (if there is right abdominal pain and diarrhea). 4. Angiodysplasia in the ascending colon.   Electronically Signed   By: Monte Fantasia M.D.   On: 11/18/2014 11:07   Dg Chest Port 1 View  11/18/2014   CLINICAL DATA:  Pneumothorax  EXAM: PORTABLE CHEST - 1 VIEW  COMPARISON:  the previous day's study  FINDINGS: Persistent right lateral subcutaneous emphysema. No pneumothorax is identified. Lungs are clear. Heart size normal. Atheromatous aorta. Blunting of right lateral costophrenic angle suggesting small effusion as before.  The right rib fracture seen on previous detail films are less conspicuous.  IMPRESSION: 1. Residual right lateral subcutaneous emphysema. No pneumothorax evident.   Electronically Signed   By: Lucrezia Europe M.D.   On: 11/18/2014 08:56   Dg Chest Port 1 View  11/17/2014   CLINICAL DATA:  81 year old female fell in the bathroom. Right pneumothorax. Initial encounter.  EXAM: PORTABLE CHEST - 1 VIEW  COMPARISON:  1238 hr today and earlier.  FINDINGS: Portable AP semi upright view at 1745 hrs. Increased right chest wall subcutaneous emphysema. However, the previously seen right pneumothorax is no longer visible.  No mediastinal shift. Stable cardiac size and mediastinal contours. Visualized tracheal air column is within normal limits. No pulmonary edema or pleural effusion identified. Stable left lung. Six seventh and eighth rib fractures were better demonstrated on the comparison earlier today.  IMPRESSION: 1. Increased right chest wall subcutaneous emphysema, but the small right pneumothorax is no longer radiographically apparent. 2. No new cardiopulmonary abnormality. 3. Right rib fractures better demonstrated on earlier rib series.   Electronically Signed   By: Genevie Ann M.D.   On: 11/17/2014 17:55    Anti-infectives: Anti-infectives    None      Assessment/Plan: s/p  Plan for discharge tomorrow Plan on discharge after appropriate disposition has been made.  Will transfer to floor today.  LOS: 2 days   Kathryne Eriksson. Dahlia Bailiff, MD, FACS [phone] Trauma Surgeon 11/19/2014

## 2014-11-19 NOTE — Progress Notes (Signed)
Patient to transfer to 6N12 report given to receiving nurse all questions answered at this time.  Pt. VSS with no s/s of distress noted.  Patient stable at transfer.

## 2014-11-20 DIAGNOSIS — W19XXXA Unspecified fall, initial encounter: Secondary | ICD-10-CM | POA: Diagnosis present

## 2014-11-20 LAB — GLUCOSE, CAPILLARY
GLUCOSE-CAPILLARY: 125 mg/dL — AB (ref 70–99)
GLUCOSE-CAPILLARY: 136 mg/dL — AB (ref 70–99)
Glucose-Capillary: 109 mg/dL — ABNORMAL HIGH (ref 70–99)
Glucose-Capillary: 133 mg/dL — ABNORMAL HIGH (ref 70–99)

## 2014-11-20 NOTE — Progress Notes (Signed)
Occupational Therapy Evaluation  Pt presents to OT with generalized weakness, acute pain, and cognitive deficits (h/o Alzheimer's).  Currently, she requires supervision and mod verbal cues to complete BADLs - forgets frequently what task she has completed and what has to be done.   Phoned daughter.  She lives next door to pt and can provide 24 hour supervision at discharge.  She reports pt performed BADLs mod I, and requires supervision with IADLs.   See content of eval for more details.   No follow up OT recommended.  But, recommend 24 hour supervision    11/20/14 1200  OT Visit Information  Last OT Received On 11/20/14  Assistance Needed +1  History of Present Illness pt fell from toilet at home with right sided rib fx.  PMH includes: Alzheimer's disease; depression, anemia   Precautions  Precautions Fall  Home Living  Family/patient expects to be discharged to: Private residence  Living Arrangements Alone  Available Help at Discharge Family;Available 24 hours/day  Type of Home House  Home Access Stairs to enter  Entrance Stairs-Number of Steps 5  Entrance Stairs-Rails Right  Home Layout One level  Bathroom Shower/Tub Tub/shower unit  Shower/tub characteristics Curtain  Tax adviser - 2 wheels;Cane - single point  Prior Function  Level of Independence Independent with assistive device(s)  Comments pt states she uses Rw and cane sometimes but not consistently in the home  Communication  Communication No difficulties  Pain Assessment  Pain Assessment Faces  Faces Pain Scale 4  Pain Location rt ribs  Pain Descriptors / Indicators Aching;Grimacing;Guarding  Pain Intervention(s) Monitored during session  Cognition  Arousal/Alertness Awake/alert  Behavior During Therapy Anxious  Overall Cognitive Status No family/caregiver present to determine baseline cognitive functioning  Area of Impairment  Orientation;Attention;Memory;Safety/judgement;Awareness  Orientation Level Time;Place  Current Attention Level Sustained  Memory Decreased short-term memory  Safety/Judgement Decreased awareness of deficits  Awareness Intellectual  General Comments Pt very agitated and confused upon therapist's entrance.  She kept asking why she "was in this room alone", "where's my daughter", and "why is she trying to make people think I am crazy".  Pt perseverated on her daughter attempting to have her decalared incompetent throughout the session.  Her working memory is very poor.  She was unable to recall information after 30 seconds requiring multiple cues to complete tasks due to forgetting what it was she was doing.   She was oriented to hospital, but not which one initially, thought she was "somewhere around [place]", and thought the year was 51.  Discussed cognitive deficits with her and the recommendation that she stop driving and needs 24 hour supervision   Upper Extremity Assessment  Upper Extremity Assessment Generalized weakness  Lower Extremity Assessment  Lower Extremity Assessment Defer to PT evaluation  Cervical / Trunk Assessment  Cervical / Trunk Assessment Normal  ADL  Overall ADL's  Needs assistance/impaired  Eating/Feeding Independent;Sitting  Grooming Wash/dry hands;Wash/dry face;Oral care;Brushing hair;Supervision/safety  Grooming Details (indicate cue type and reason) Pt requires mod verbal cues to complete tasks as she is unable to recall what she has done and what is yet to be done.  Each time pt was distracted, she thought it was time to brush her teeth, which she had already performed.  She required mod cues to brush her hair due to getting distracted and forgetting that she had not performed task.   Upper Body Bathing Supervision/ safety;Sitting;Standing  Lower Body Bathing Supervison/ safety;Sit to/from stand  Upper Body Dressing  Supervision/safety;Sitting  Lower Body  Dressing Supervision/safety;Sit to/from Arboriculturist;Ambulation;Comfort height toilet;Regular Toilet;RW  Toilet Transfer Details (indicate cue type and reason) requires cues for walker safety.  Pt unable to find bathroom even after instruction provided.  She attempted to look behind curtains for the bathroom   Toileting- Clothing Manipulation and Hygiene Supervision/safety;Sit to/from stand  Tub/Shower Transfer Details (indicate cue type and reason) Pt reports she takes tub baths.  Advised her that she is unsafe to do this at this time.  Recommend either a tub seat or that she take tub baths   Functional mobility during ADLs Supervision/safety;Rolling walker  General ADL Comments Pt requires supervision with all tasks due to severity of cognitive deficits  Vision- History  Baseline Vision/History Wears glasses  Bed Mobility  Overal bed mobility Needs Assistance  Bed Mobility Rolling;Sidelying to Sit;Sit to Sidelying  Rolling Supervision  Sidelying to sit Supervision  Supine to sit Supervision  Sit to supine Supervision  Transfers  Overall transfer level Needs assistance  Equipment used Rolling walker (2 wheeled)  Transfers Sit to/from Stand;Stand Pivot Transfers  Sit to Stand Supervision  Stand pivot transfers Supervision  General transfer comment Pt requires cues for walker safety   Balance  Overall balance assessment Needs assistance  Sitting-balance support Feet supported  Sitting balance-Leahy Scale Good  Standing balance support During functional activity  Standing balance-Leahy Scale Fair  General Comments  General comments (skin integrity, edema, etc.) Phoned pt's daughter.  She lives next door to pt and can provide 24 hour supervision at discharge.  She reports pt has been functioning fairly well at home, but does have increased confusion at times.  Pt only drives on occasion,  Recommended to daughter that pt discontinue driving at this time.   Daughter did voice concern about being able to manage pt if confusion persists - states she knows that pt will eventually need placement.  Discussed the option with her now, but she wants pt to discharge home.  She states their primary MDs are working with them on the appropriate timing of making those transitions   OT - End of Session  Equipment Utilized During Treatment Rolling walker  Activity Tolerance Patient tolerated treatment well  Patient left in bed;with call bell/phone within reach;with bed alarm set  Nurse Communication Mobility status  OT Assessment  OT Therapy Diagnosis  Generalized weakness;Cognitive deficits  OT Recommendation/Assessment Patient needs continued OT Services  OT Problem List Decreased strength;Decreased activity tolerance;Impaired balance (sitting and/or standing);Decreased cognition;Decreased safety awareness;Pain  OT Plan  OT Frequency (ACUTE ONLY) Min 2X/week  OT Treatment/Interventions (ACUTE ONLY) Self-care/ADL training;DME and/or AE instruction;Therapeutic activities;Cognitive remediation/compensation;Patient/family education;Balance training  OT Recommendation  Follow Up Recommendations No OT follow up;Supervision/Assistance - 24 hour  OT Equipment None recommended by OT  Individuals Consulted  Consulted and Agree with Results and Recommendations Patient;Family member/caregiver  Family Member Consulted daughter (via phone  Acute Rehab OT Goals  Patient Stated Goal return home  OT Goal Formulation With patient/family  Time For Goal Achievement 11/27/14  Potential to Achieve Goals Good  OT Time Calculation  OT Start Time (ACUTE ONLY) 1231  OT Stop Time (ACUTE ONLY) 1254  OT Time Calculation (min) 23 min  OT General Charges  $OT Visit 1 Procedure  OT Evaluation  $Initial OT Evaluation Tier I 1 Procedure  OT Treatments  $Self Care/Home Management  8-22 mins  Written Expression  Dominant Hand Right  Lucille Passy, OTR/L [phone]

## 2014-11-20 NOTE — Progress Notes (Signed)
Patient ID: SIDRA OLDFIELD, female   DOB: 16-Nov-1916, 81 y.o.   MRN: [id number]   LOS: 3 days   Subjective: Feels pretty good.   Objective: Vital signs in last 24 hours: Temp:  [98 F (36.7 C)-98.5 F (36.9 C)] 98.4 F (36.9 C) (02/15 0641) Pulse Rate:  [73-88] 77 (02/15 0641) Resp:  [14-19] 14 (02/15 0641) BP: (120-144)/(59-68) 123/62 mmHg (02/15 0641) SpO2:  [96 %-98 %] 96 % (02/15 0641) Last BM Date: 11/17/14   Laboratory  CBG (last 3)   Recent Labs  11/19/14 1255 11/19/14 1719 11/19/14 2317  GLUCAP 137* 122* 134*    Physical Exam General appearance: alert and no distress Resp: clear to auscultation bilaterally Cardio: regular rate and rhythm GI: normal findings: bowel sounds normal and soft, non-tender   Assessment/Plan: Fall Multiple right rib fxs w/PTX -- Pulmonary toilet Multiple medical problems -- Home meds Dispo -- Home today after OT eval    Lisette Abu, PA-C Pager: [phone] General Trauma PA Pager: [phone]  11/20/2014

## 2014-11-21 LAB — GLUCOSE, CAPILLARY
GLUCOSE-CAPILLARY: 123 mg/dL — AB (ref 70–99)
Glucose-Capillary: 108 mg/dL — ABNORMAL HIGH (ref 70–99)

## 2014-11-21 MED ORDER — TRAMADOL HCL 50 MG PO TABS: 50.0000 mg | ORAL_TABLET | Freq: Four times a day (QID) | ORAL | Status: DC | PRN

## 2014-11-21 NOTE — Discharge Summary (Signed)
Physician Discharge Summary  Patient ID: Kara Santos MRN: [id number] DOB/AGE: Nov 22, 1916 81 y.o.  Admit date: 11/17/2014 Discharge date: 11/21/2014  Discharge Diagnoses Patient Active Problem List   Diagnosis Date Noted  . Fall 11/20/2014  . Multiple rib fractures 11/17/2014  . AVM (arteriovenous malformation) of stomach, acquired with hemorrhage 12/04/2013  . Closed fracture of greater trochanter of femur 02/06/2013  . History of fall 02/06/2013  . Iron deficiency anemia 05/05/2012  . Chronic gastrointestinal bleeding 03/04/2012  . Diabetes mellitus type 2 in nonobese 03/04/2012  . Hypothyroidism 03/04/2012  . History of thyroid cancer 03/04/2012    Consultants None   Procedures None   HPI: [name] presented as a transfer from [facility]. She presented after a ground-level fall the day prior. She fell off her toilet hitting her right side. Because of increasing discomfort she presented to the hospital. She was found to have a small right apical pneumothorax with 3 rib fractures so was transferred to the trauma service here for further care.    Hospital Course: The patient did well in the hospital. Her pneumothorax did not extend and she did not need a thoracostomy. Her pain was controlled on oral medications. She did not suffer any respiratory compromise. She was mobilized with physical and occupational therapies who recommended home with home health and 24-hour supervision. Her daughter was able to provide that and she was discharged home in good condition.     Medication List    TAKE these medications        acetaminophen 325 MG tablet  Commonly known as:  TYLENOL  Take 650 mg by mouth every 4 (four) hours as needed for pain.     clotrimazole-betamethasone cream  Commonly known as:  LOTRISONE  Apply topically 2 (two) times daily.     cyanocobalamin 100 MCG tablet  Take 100 mcg by mouth daily.     escitalopram 10 MG tablet  Commonly known as:  LEXAPRO   TAKE ONE TABLET BY MOUTH ONCE DAILY.     Iron Polysacch Cmplx-B12-FA 150-0.025-1 MG Caps  Commonly known as:  POLY-IRON 150 FORTE  Take 1 tablet by mouth daily.     lansoprazole 30 MG capsule  Commonly known as:  PREVACID  Take 30 mg by mouth daily at 12 noon.     levothyroxine 100 MCG tablet  Commonly known as:  SYNTHROID, LEVOTHROID  Take 100 mcg by mouth daily before breakfast.     metFORMIN 500 MG 24 hr tablet  Commonly known as:  GLUCOPHAGE-XR  Take 250 mg by mouth 2 (two) times daily.     multivitamin tablet  Take 1 tablet by mouth daily.     traMADol 50 MG tablet  Commonly known as:  ULTRAM  Take 1-2 tablets (50-100 mg total) by mouth every 6 (six) hours as needed for moderate pain.            Follow-up Information    Follow up with HAWKINS,EDWARD L, MD.   Specialty:  Pulmonary Disease   Contact information:   [address] [place] [address] [phone]       Follow up with [facility].   Why:  As needed   Contact information:   [address] [id number] [phone]       Signed: Lisette Abu, PA-C Pager: [phone] General Trauma PA Pager: [phone] 11/21/2014, 8:55 AM

## 2014-11-21 NOTE — Progress Notes (Signed)
Discharge instructions reviewed with patient and patient's daughter at the bedside. Appropriate questions were asked. Rx given. Pt ready for discharge.

## 2014-11-21 NOTE — Progress Notes (Signed)
Called patient's daughter, [name], to discuss [facility] agency choices since pt is confused.  Daughter chose [place] for agency.  Referral called to [facility] hospital rep.   Medicare IM (Important Message) delivered to patient today by me in anticipation of discharge.   Sandi Mariscal, RN BSN MHA CCM  Case Manager, Trauma Service/Unit 58M [phone]

## 2014-11-21 NOTE — Progress Notes (Signed)
Physical Therapy Treatment Patient Details Name: Kara Santos MRN: [id number] DOB: 1916/11/06 Today's Date: 11/21/2014    History of Present Illness pt fell from toilet at home with right sided rib fx.  PMH includes: Alzheimer's disease; depression, anemia     PT Comments    Pt. Has AD in place and an accessible home environment to prevent falls. PT has suggested to move in with daughter for safety and cognitive issues.Pt. Very safe with mobility with the RW and able to climb stairs with support of rails. Reports very little pain today and ambulated without issues.  Follow Up Recommendations  Home health PT;Supervision for mobility/OOB     Equipment Recommendations  None recommended by PT    Recommendations for Other Services       Precautions / Restrictions Precautions Precautions: Fall Restrictions Weight Bearing Restrictions: No    Mobility  Bed Mobility               General bed mobility comments: up in chair on arrival  Transfers Overall transfer level: Needs assistance Equipment used: Rolling walker (2 wheeled) Transfers: Sit to/from [employer] Transfers Sit to Stand: Supervision Stand pivot transfers: Supervision       General transfer comment: pt. demonstrated safe use of RW. supervision for safety  Ambulation/Gait Ambulation/Gait assistance: Supervision Ambulation Distance (Feet): 300 Feet Assistive device: Rolling walker (2 wheeled) Gait Pattern/deviations: Decreased stride length;Narrow base of support;Step-through pattern Gait velocity: WFL Gait velocity interpretation: at or above normal speed for age/gender General Gait Details: cues for direction   Stairs Stairs: Yes Stairs assistance: Min guard Stair Management: One rail Right;Step to pattern;Forwards;Sideways Number of Stairs: 5 General stair comments: min g for safety, also practiced two steps sideways if nobody was with her  Wheelchair Mobility    Modified Rankin (Stroke  Patients Only)       Balance Overall balance assessment: Needs assistance Sitting-balance support: Feet supported Sitting balance-Leahy Scale: Good     Standing balance support: No upper extremity supported Standing balance-Leahy Scale: Poor Standing balance comment: while standing with no UE support LOB with perturbations, but with Bil. UE support during functional activity no balance issues                     Cognition Arousal/Alertness: Awake/alert Behavior During Therapy: WFL for tasks assessed/performed Overall Cognitive Status: No family/caregiver present to determine baseline cognitive functioning                 General Comments: Pt. was confused about what room was hers when therapist pointed it out.Recognized it onceshe saw it though.    Exercises      General Comments        Pertinent Vitals/Pain Pain Score: 2  Pain Location: rt ribs Pain Descriptors / Indicators: Aching Pain Intervention(s): Monitored during session    Home Living                      Prior Function            PT Goals (current goals can now be found in the care plan section) Progress towards PT goals: Progressing toward goals    Frequency  Min 3X/week    PT Plan Current plan remains appropriate    Co-evaluation             End of Session Equipment Utilized During Treatment: Gait belt Activity Tolerance: Patient tolerated treatment well Patient left: in chair;with call bell/phone within  reach;with chair alarm set     Time: 1021-1050 PT Time Calculation (min) (ACUTE ONLY): 29 min  Charges:                       G Codes:      Jodi Geralds, SPTA 11/21/2014, 12:13 PM

## 2014-11-21 NOTE — Progress Notes (Signed)
Patient ID: Kara Santos, female   DOB: [birth year], 81 y.o.   MRN: [id number]   LOS: 4 days   Subjective: No new c/o.   Objective: Vital signs in last 24 hours: Temp:  [97.6 F (36.4 C)-97.9 F (36.6 C)] 97.6 F (36.4 C) (02/16 0607) Pulse Rate:  [77-87] 81 (02/16 0607) Resp:  [16] 16 (02/16 0607) BP: (121-154)/(58-71) 125/58 mmHg (02/16 0607) SpO2:  [97 %-98 %] 97 % (02/16 0607) Last BM Date: 11/17/14   Laboratory  CBG (last 3)   Recent Labs  11/20/14 1656 11/20/14 2145 11/21/14 0732  GLUCAP 125* 136* 108*    Physical Exam General appearance: alert and no distress Resp: clear to auscultation bilaterally Cardio: regular rate and rhythm GI: normal findings: bowel sounds normal and soft, non-tender   Assessment/Plan: Fall Multiple right rib fxs w/PTX -- Pulmonary toilet Multiple medical problems -- Home meds Dispo -- Home today     Lisette Abu, PA-C Pager: [phone] General Trauma PA Pager: [phone]  11/21/2014

## 2014-11-22 DIAGNOSIS — Z9181 History of falling: Secondary | ICD-10-CM | POA: Diagnosis not present

## 2014-11-22 DIAGNOSIS — E119 Type 2 diabetes mellitus without complications: Secondary | ICD-10-CM | POA: Diagnosis not present

## 2014-11-22 DIAGNOSIS — D509 Iron deficiency anemia, unspecified: Secondary | ICD-10-CM | POA: Diagnosis not present

## 2014-11-22 DIAGNOSIS — S2241XD Multiple fractures of ribs, right side, subsequent encounter for fracture with routine healing: Secondary | ICD-10-CM | POA: Diagnosis not present

## 2014-11-22 DIAGNOSIS — G309 Alzheimer's disease, unspecified: Secondary | ICD-10-CM | POA: Diagnosis not present

## 2014-11-24 DIAGNOSIS — G309 Alzheimer's disease, unspecified: Secondary | ICD-10-CM | POA: Diagnosis not present

## 2014-11-24 DIAGNOSIS — D509 Iron deficiency anemia, unspecified: Secondary | ICD-10-CM | POA: Diagnosis not present

## 2014-11-24 DIAGNOSIS — S2241XD Multiple fractures of ribs, right side, subsequent encounter for fracture with routine healing: Secondary | ICD-10-CM | POA: Diagnosis not present

## 2014-11-24 DIAGNOSIS — E119 Type 2 diabetes mellitus without complications: Secondary | ICD-10-CM | POA: Diagnosis not present

## 2014-11-24 DIAGNOSIS — Z9181 History of falling: Secondary | ICD-10-CM | POA: Diagnosis not present

## 2014-11-27 DIAGNOSIS — G309 Alzheimer's disease, unspecified: Secondary | ICD-10-CM | POA: Diagnosis not present

## 2014-11-27 DIAGNOSIS — Z9181 History of falling: Secondary | ICD-10-CM | POA: Diagnosis not present

## 2014-11-27 DIAGNOSIS — S2241XD Multiple fractures of ribs, right side, subsequent encounter for fracture with routine healing: Secondary | ICD-10-CM | POA: Diagnosis not present

## 2014-11-27 DIAGNOSIS — D509 Iron deficiency anemia, unspecified: Secondary | ICD-10-CM | POA: Diagnosis not present

## 2014-11-27 DIAGNOSIS — E119 Type 2 diabetes mellitus without complications: Secondary | ICD-10-CM | POA: Diagnosis not present

## 2014-11-28 DIAGNOSIS — G309 Alzheimer's disease, unspecified: Secondary | ICD-10-CM | POA: Diagnosis not present

## 2014-11-28 DIAGNOSIS — S2241XD Multiple fractures of ribs, right side, subsequent encounter for fracture with routine healing: Secondary | ICD-10-CM | POA: Diagnosis not present

## 2014-11-28 DIAGNOSIS — D509 Iron deficiency anemia, unspecified: Secondary | ICD-10-CM | POA: Diagnosis not present

## 2014-11-28 DIAGNOSIS — E119 Type 2 diabetes mellitus without complications: Secondary | ICD-10-CM | POA: Diagnosis not present

## 2014-11-28 DIAGNOSIS — Z9181 History of falling: Secondary | ICD-10-CM | POA: Diagnosis not present

## 2014-11-30 DIAGNOSIS — E119 Type 2 diabetes mellitus without complications: Secondary | ICD-10-CM | POA: Diagnosis not present

## 2014-11-30 DIAGNOSIS — S2241XD Multiple fractures of ribs, right side, subsequent encounter for fracture with routine healing: Secondary | ICD-10-CM | POA: Diagnosis not present

## 2014-11-30 DIAGNOSIS — D509 Iron deficiency anemia, unspecified: Secondary | ICD-10-CM | POA: Diagnosis not present

## 2014-11-30 DIAGNOSIS — Z9181 History of falling: Secondary | ICD-10-CM | POA: Diagnosis not present

## 2014-11-30 DIAGNOSIS — G309 Alzheimer's disease, unspecified: Secondary | ICD-10-CM | POA: Diagnosis not present

## 2014-12-04 DIAGNOSIS — E119 Type 2 diabetes mellitus without complications: Secondary | ICD-10-CM | POA: Diagnosis not present

## 2014-12-04 DIAGNOSIS — D509 Iron deficiency anemia, unspecified: Secondary | ICD-10-CM | POA: Diagnosis not present

## 2014-12-04 DIAGNOSIS — G309 Alzheimer's disease, unspecified: Secondary | ICD-10-CM | POA: Diagnosis not present

## 2014-12-04 DIAGNOSIS — Z9181 History of falling: Secondary | ICD-10-CM | POA: Diagnosis not present

## 2014-12-04 DIAGNOSIS — S2241XD Multiple fractures of ribs, right side, subsequent encounter for fracture with routine healing: Secondary | ICD-10-CM | POA: Diagnosis not present

## 2014-12-05 NOTE — Progress Notes (Signed)
Kara Bogus, MD [place] [address]  Iron deficiency anemia  CURRENT THERAPY: IV Feraheme 510 mg day 1 and 8 every 3 months  INTERVAL HISTORY: Kara Santos 81 y.o. female returns for followup of iron deficiency anemia requiring intravenous iron. 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  I personally reviewed and went over laboratory results with the patient.  The results are noted within this dictation.  I personally reviewed and went over radiographic studies with the patient.  The results are noted within this dictation.    Chart reviewed.  Hospitalization noted from 2/12- 11/21/2014 for a fall occuring while she was on the toilet resulting in 3 rib fractures and right apical pneumothorax requiring transfer to trauma service from [facility].  She did not require thoracostomy and pain was controlled with oral medications.  We had a discussion regarding age and she reports that she does not know why she is still alive.  She has outlived her friends and a good majority of her family.  I think she is the oldest person in our practice currently.  She spoke of religion and God.  She is a very pleasant person to talk to.  Her daughter accompanies the patient today and she participated in discussion today.  Hematologically, she denies any complaints and ROS questioning is negative.  Past Medical History  Diagnosis Date  . Anemia 11/07/2011    chronic IDA requiring multiple transfusions  . Diabetes mellitus   . Hypothyroidism     s/p thyroid surgery for thyroid cancer  . GERD (gastroesophageal reflux disease)   . Iron deficiency anemia 05/05/2012  . Depression   . Closed fracture of greater trochanter of femur 02/2013    left  . AVM (arteriovenous malformation) of stomach, acquired with hemorrhage 12/04/2013  . Alzheimer's disease, early onset 79  . Fall at home 11/17/14   multiple rib fractures    has Chronic gastrointestinal bleeding; Diabetes mellitus type 2 in nonobese; Hypothyroidism; History of thyroid cancer; Iron deficiency anemia; Closed fracture of greater trochanter of femur; History of fall; AVM (arteriovenous malformation) of stomach, acquired with hemorrhage; Multiple rib fractures; and Fall on her problem list.     is allergic to asa.  Ms. Janowski does not currently have medications on file.  Past Surgical History  Procedure Laterality Date  . Esophagogastroduodenoscopy  08/2006    Dr. Trevor Iha [facility], large duodenal diverticulum  . Colonoscopy  08/2006    Dr. Cecelia Byars sided diverticulosis  . Givens small bowel capsule  08/2006    few gastric erosions, numerous small bowel AVMs with SB erosions  . Double balloon enteroscopy  06/1008    [facility]-->APC of 12 SB AVMs in jejunum. 6 feet of jejunum inspected  . Right shoulder surgery    . Cholecystectomy    . Appendectomy    . Bilateral cataract extractions    . Breast reduction surgery    . Thyroid surgery      thyroid cancer    Denies any headaches, dizziness, double vision, fevers, chills, night sweats, nausea, vomiting, diarrhea, constipation, chest pain, heart palpitations, shortness of breath, blood in stool, black tarry stool, urinary pain, urinary burning, urinary frequency, hematuria.   PHYSICAL EXAMINATION  ECOG PERFORMANCE STATUS: 1 - Symptomatic but completely ambulatory  Filed Vitals:   12/06/14 0942  BP: 139/71  Pulse: 95  Temp: 97.3 F (  36.3 C)  Resp: 18    GENERAL:alert, no distress, well nourished, well developed, comfortable, cooperative and smiling SKIN: skin color, texture, turgor are normal, no rashes or significant lesions HEAD: Normocephalic, No masses, lesions, tenderness or abnormalities EYES: normal, PERRLA, EOMI, Conjunctiva are pink and non-injected EARS: External ears normal OROPHARYNX:lips, buccal mucosa, and tongue normal and mucous membranes are  moist  NECK: supple, no stridor, trachea midline LYMPH:  not examined BREAST:not examined LUNGS: clear to auscultation  HEART: regular rate & rhythm, no murmurs and no gallops ABDOMEN:normal bowel sounds BACK: Back symmetric, no curvature. EXTREMITIES:less then 2 second capillary refill, no joint deformities, effusion, or inflammation, no skin discoloration, no cyanosis  NEURO: alert & oriented x 3 with fluent speech, no focal motor/sensory deficits, gait normal   LABORATORY DATA: CBC    Component Value Date/Time   WBC 7.8 12/06/2014 1009   RBC 3.52* 12/06/2014 1009   RBC 3.96 11/21/2009 2230   HGB 10.7* 12/06/2014 1009   HCT 32.2* 12/06/2014 1009   PLT 181 12/06/2014 1009   MCV 91.5 12/06/2014 1009   MCH 30.4 12/06/2014 1009   MCHC 33.2 12/06/2014 1009   RDW 14.7 12/06/2014 1009   LYMPHSABS 1.2 11/19/2014 0252   MONOABS 0.6 11/19/2014 0252   EOSABS 0.1 11/19/2014 0252   BASOSABS 0.0 11/19/2014 0252      Chemistry      Component Value Date/Time   NA 141 11/18/2014 0329   K 3.5 11/18/2014 0329   CL 108 11/18/2014 0329   CO2 26 11/18/2014 0329   BUN 9 11/18/2014 0329   CREATININE 0.67 11/18/2014 0329      Component Value Date/Time   CALCIUM 8.0* 11/18/2014 0329   ALKPHOS 71 11/17/2014 1343   AST 122* 11/17/2014 1343   ALT 64* 11/17/2014 1343   BILITOT 0.7 11/17/2014 1343      Lab Results  Component Value Date   IRON 77 09/14/2013   TIBC 228* 09/14/2013   FERRITIN 50 09/13/2014     RADIOGRAPHIC STUDIES:  Dg Ribs Unilateral W/chest Right  11/17/2014   CLINICAL DATA:  Fall with right rib and shoulder pain. Initial encounter.  EXAM: RIGHT RIBS AND CHEST - 3+ VIEW  COMPARISON:  02/06/2013  FINDINGS: Acute fractures of the right sixth, seventh, and eighth ribs with mild displacement. There is associated air leak with soft tissue gas and a small right apical pneumothorax (less than 10%).  No cardiomegaly or aortic contour abnormality. There is chronic interstitial  coarsening without evidence of contusion. No evidence for hemothorax.  Critical Value/emergent results were called by telephone at the time of interpretation on 11/17/2014 at 1:14 pm to Dr. Fredia Sorrow , who verbally acknowledged these results.  IMPRESSION: 1. Small right apical pneumothorax. 2. Right 6th, 7th, and 8th rib fractures.   Electronically Signed   By: Monte Fantasia M.D.   On: 11/17/2014 13:14   Dg Shoulder Right  11/17/2014   CLINICAL DATA:  Right rib and right shoulder pain.  EXAM: RIGHT SHOULDER - 2+ VIEW  COMPARISON:  06/17/2015 and rib examination 11/17/2014  FINDINGS: The right humeral head is elevated with relation to the right acromion. Findings suggest underlying rotator cuff disease or tear. Mild irregularity at the right [facility] joint consistent with degenerative changes. There is a displaced fracture involving the right seventh rib. The right shoulder appears to be located but the scapular Y-view is limited. No acute fracture involving the right shoulder.  IMPRESSION: Right seventh rib fracture.  No  acute bone abnormality involving the right shoulder. The right humeral head is elevated and raises concern for underlying rotator cuff disease.   Electronically Signed   By: Markus Daft M.D.   On: 11/17/2014 13:14   Ct Head Wo Contrast  11/17/2014   CLINICAL DATA:  [place] off the toilet around midnight hitting right side, now with right-sided soreness. History of thyroid cancer. Initial encounter.  EXAM: CT HEAD WITHOUT CONTRAST  CT CERVICAL SPINE WITHOUT CONTRAST  TECHNIQUE: Multidetector CT imaging of the head and cervical spine was performed following the standard protocol without intravenous contrast. Multiplanar CT image reconstructions of the cervical spine were also generated.  COMPARISON:  Head CT - 07/22/2013; 02/06/2013; cervical spine CT -02/06/2013  FINDINGS: CT HEAD FINDINGS  Similar findings of atrophy with sulcal prominence and prominence of the bifrontal extra-axial spaces.  Grossly unchanged rather extensive periventricular hypodensities compatible with microvascular ischemic disease. The gray-white differentiation is otherwise well maintained without CT evidence of acute large territory infarct. No intraparenchymal or extra-axial mass or hemorrhage. Unchanged size and configuration of the ventricles and cisterns. No shift. Intracranial atherosclerosis.  Limited visualization of the paranasal sinuses and mastoid air cells is normal. Regional soft tissues appear normal. No displaced calvarial fracture.  CT CERVICAL SPINE FINDINGS  C1 to the superior endplate of T3 is imaged.  There is minimal (approximately 1-2 mm) of anterolisthesis of C4 upon C5. There is minimal (approximately 2-3 mm) of retrolisthesis of C5 upon C6. There is unchanged straightening and slight reversal of the expected cervical lordosis with mild kyphosis centered about the C4-C5 articulation. The dens is normally positioned between the lateral masses of C1. Normal atlantodental and atlantoaxial articulations.  No fracture or static subluxation of the cervical spine. Cervical vertebral body heights are preserved. Prevertebral soft tissues are normal.  Moderate severe multilevel cervical spine in DDD, worse at C5-C6 with disc space height loss, endplate irregularity and small posteriorly directed disc osteophyte complex at this location. Posteriorly directed disc osteophyte complexes are also seen at C3-C4 and C4-C5.  There is unchanged partial ossification of the posterior longitudinal ligament / interspinous ligament between the C3-C4 and C4-C5 spinous processes (sagittal image 34, series 7). There is also unchanged ossification of the nuchal ligament posterior to the C5 spinous process.  Atherosclerotic plaque with the bilateral carotid bulbs. No bulky cervical lymphadenopathy on this noncontrast examination. The thyroid is surgically absent.  Limited visualization of the lung apices demonstrates a trace right  apical pneumothorax (representative images 63 and 77, series 5).  IMPRESSION: 1. Similar findings of atrophy and microvascular ischemic disease without acute intracranial process. 2. No fracture or static subluxation of the cervical spine. 3. Moderate to severe multilevel cervical spine DDD, worse at C5-C6. 4. Re- demonstrated trace right apical pneumothorax as was seen on chest radiograph performed earlier same day.   Electronically Signed   By: Sandi Mariscal M.D.   On: 11/17/2014 15:47   Ct Cervical Spine Wo Contrast  11/17/2014   CLINICAL DATA:  [place] off the toilet around midnight hitting right side, now with right-sided soreness. History of thyroid cancer. Initial encounter.  EXAM: CT HEAD WITHOUT CONTRAST  CT CERVICAL SPINE WITHOUT CONTRAST  TECHNIQUE: Multidetector CT imaging of the head and cervical spine was performed following the standard protocol without intravenous contrast. Multiplanar CT image reconstructions of the cervical spine were also generated.  COMPARISON:  Head CT - 07/22/2013; 02/06/2013; cervical spine CT -02/06/2013  FINDINGS: CT HEAD FINDINGS  Similar findings of atrophy  with sulcal prominence and prominence of the bifrontal extra-axial spaces. Grossly unchanged rather extensive periventricular hypodensities compatible with microvascular ischemic disease. The gray-white differentiation is otherwise well maintained without CT evidence of acute large territory infarct. No intraparenchymal or extra-axial mass or hemorrhage. Unchanged size and configuration of the ventricles and cisterns. No shift. Intracranial atherosclerosis.  Limited visualization of the paranasal sinuses and mastoid air cells is normal. Regional soft tissues appear normal. No displaced calvarial fracture.  CT CERVICAL SPINE FINDINGS  C1 to the superior endplate of T3 is imaged.  There is minimal (approximately 1-2 mm) of anterolisthesis of C4 upon C5. There is minimal (approximately 2-3 mm) of retrolisthesis of C5 upon  C6. There is unchanged straightening and slight reversal of the expected cervical lordosis with mild kyphosis centered about the C4-C5 articulation. The dens is normally positioned between the lateral masses of C1. Normal atlantodental and atlantoaxial articulations.  No fracture or static subluxation of the cervical spine. Cervical vertebral body heights are preserved. Prevertebral soft tissues are normal.  Moderate severe multilevel cervical spine in DDD, worse at C5-C6 with disc space height loss, endplate irregularity and small posteriorly directed disc osteophyte complex at this location. Posteriorly directed disc osteophyte complexes are also seen at C3-C4 and C4-C5.  There is unchanged partial ossification of the posterior longitudinal ligament / interspinous ligament between the C3-C4 and C4-C5 spinous processes (sagittal image 34, series 7). There is also unchanged ossification of the nuchal ligament posterior to the C5 spinous process.  Atherosclerotic plaque with the bilateral carotid bulbs. No bulky cervical lymphadenopathy on this noncontrast examination. The thyroid is surgically absent.  Limited visualization of the lung apices demonstrates a trace right apical pneumothorax (representative images 63 and 77, series 5).  IMPRESSION: 1. Similar findings of atrophy and microvascular ischemic disease without acute intracranial process. 2. No fracture or static subluxation of the cervical spine. 3. Moderate to severe multilevel cervical spine DDD, worse at C5-C6. 4. Re- demonstrated trace right apical pneumothorax as was seen on chest radiograph performed earlier same day.   Electronically Signed   By: Sandi Mariscal M.D.   On: 11/17/2014 15:47   Ct Abdomen Pelvis W Contrast  11/18/2014   CLINICAL DATA:  Acute blood loss anemia. Right rib fracture, evaluate for laceration.  EXAM: CT ABDOMEN AND PELVIS WITH CONTRAST  TECHNIQUE: Multidetector CT imaging of the abdomen and pelvis was performed using the  standard protocol following bolus administration of intravenous contrast.  CONTRAST:  169mL OMNIPAQUE IOHEXOL 300 MG/ML  SOLN  COMPARISON:  None.  FINDINGS: BODY WALL: Subcutaneous emphysema along the right lower chest wall related to known rib fracture and air leak. The gas has increased from the admission radiography, but is likely stable from chest x-ray earlier today.  LOWER CHEST: Known small right basilar pneumothorax. There is a small, layering right pleural effusion. No evidence for hemothorax.  ABDOMEN/PELVIS:  Liver: No focal abnormality.  Biliary: Cholecystectomy.  Pancreas: Unremarkable.  Spleen: Unremarkable.  Adrenals: Unremarkable.  Kidneys and ureters: No hydronephrosis or stone.  Bladder: Unremarkable.  Reproductive: Unremarkable.  Bowel: Large volume formed stool in the distal colon. The proximal colon appears thickened, but there is no inflammatory fat infiltration. There is an abnormal tangle of vessels within the submucosal space of the proximal colon, with prominent Vasa recta. Given patient age and location this finding is consistent with angiodysplasia. This is best visualized in the coronal projection.  Retroperitoneum: No mass or adenopathy.  Peritoneum: No ascites or pneumoperitoneum.  Vascular:  No acute abnormality.  OSSEOUS: Mildly displaced posterior right eighth rib fracture. Marked osteopenia.  IMPRESSION: 1. No intra-abdominal explanation for acute blood loss anemia. 2. Small right hydropneumothorax at the base. 3. Probable constipation. There is thickening of the proximal colon which could be from underdistention or colitis (if there is right abdominal pain and diarrhea). 4. Angiodysplasia in the ascending colon.   Electronically Signed   By: Monte Fantasia M.D.   On: 11/18/2014 11:07   Dg Chest Port 1 View  11/18/2014   CLINICAL DATA:  Pneumothorax  EXAM: PORTABLE CHEST - 1 VIEW  COMPARISON:  the previous day's study  FINDINGS: Persistent right lateral subcutaneous emphysema.  No pneumothorax is identified. Lungs are clear. Heart size normal. Atheromatous aorta. Blunting of right lateral costophrenic angle suggesting small effusion as before. The right rib fracture seen on previous detail films are less conspicuous.  IMPRESSION: 1. Residual right lateral subcutaneous emphysema. No pneumothorax evident.   Electronically Signed   By: Lucrezia Europe M.D.   On: 11/18/2014 08:56   Dg Chest Port 1 View  11/17/2014   CLINICAL DATA:  81 year old female fell in the bathroom. Right pneumothorax. Initial encounter.  EXAM: PORTABLE CHEST - 1 VIEW  COMPARISON:  1238 hr today and earlier.  FINDINGS: Portable AP semi upright view at 1745 hrs. Increased right chest wall subcutaneous emphysema. However, the previously seen right pneumothorax is no longer visible.  No mediastinal shift. Stable cardiac size and mediastinal contours. Visualized tracheal air column is within normal limits. No pulmonary edema or pleural effusion identified. Stable left lung. Six seventh and eighth rib fractures were better demonstrated on the comparison earlier today.  IMPRESSION: 1. Increased right chest wall subcutaneous emphysema, but the small right pneumothorax is no longer radiographically apparent. 2. No new cardiopulmonary abnormality. 3. Right rib fractures better demonstrated on earlier rib series.   Electronically Signed   By: Genevie Ann M.D.   On: 11/17/2014 17:55     ASSESSMENT AND PLAN:  Iron deficiency anemia Secondary to AVM and chronic blood loss requiring IV iron replacement every 3 months.  Supportive therapy plan developed to provide patient's historical requirement for iron and this plan is reviewed today.  Labs today as planned: CBC diff, ferritin.  IV Feraheme 510 mg today and next week.  Labs in 3 and 6 months: CBC diff, ferritin.  IV Feraheme 510 mg on day 1 and 8 in 3 months and 6 months.  We will not wait on lab work for IV Feraheme.  Return in 6 months for follow-up.   THERAPY PLAN:  We will  continue to monitor counts and provide IV Feraheme in a day 1 and 8 fashion every 3 months.  Frequency and dosing of supportive therapy plan can be altered as needed.  All questions were answered. The patient knows to call the clinic with any problems, questions or concerns. We can certainly see the patient much sooner if necessary.  Patient and plan discussed with Dr. Ancil Linsey and she is in agreement with the aforementioned.   This note is electronically signed by: [name] 12/06/2014 10:40 AM

## 2014-12-05 NOTE — Assessment & Plan Note (Addendum)
Secondary to AVM and chronic blood loss requiring IV iron replacement every 3 months.  Supportive therapy plan developed to provide patient's historical requirement for iron and this plan is reviewed today.  Labs today as planned: CBC diff, ferritin.  IV Feraheme 510 mg today and next week.  Labs in 3 and 6 months: CBC diff, ferritin.  IV Feraheme 510 mg on day 1 and 8 in 3 months and 6 months.  We will not wait on lab work for IV Feraheme.  Return in 6 months for follow-up.

## 2014-12-06 ENCOUNTER — Encounter (HOSPITAL_COMMUNITY): Payer: Self-pay | Admitting: Oncology

## 2014-12-06 ENCOUNTER — Encounter (HOSPITAL_BASED_OUTPATIENT_CLINIC_OR_DEPARTMENT_OTHER): Payer: Medicare Other

## 2014-12-06 ENCOUNTER — Encounter (HOSPITAL_COMMUNITY): Payer: Medicare Other | Attending: Internal Medicine | Admitting: Oncology

## 2014-12-06 VITALS — BP 139/71 | HR 95 | Temp 97.3°F | Resp 18 | Wt 97.6 lb

## 2014-12-06 DIAGNOSIS — D500 Iron deficiency anemia secondary to blood loss (chronic): Secondary | ICD-10-CM

## 2014-12-06 DIAGNOSIS — D509 Iron deficiency anemia, unspecified: Secondary | ICD-10-CM | POA: Diagnosis not present

## 2014-12-06 DIAGNOSIS — D62 Acute posthemorrhagic anemia: Secondary | ICD-10-CM | POA: Insufficient documentation

## 2014-12-06 DIAGNOSIS — K31811 Angiodysplasia of stomach and duodenum with bleeding: Secondary | ICD-10-CM | POA: Diagnosis not present

## 2014-12-06 DIAGNOSIS — K922 Gastrointestinal hemorrhage, unspecified: Secondary | ICD-10-CM

## 2014-12-06 LAB — CBC
HCT: 32.2 % — ABNORMAL LOW (ref 36.0–46.0)
HEMOGLOBIN: 10.7 g/dL — AB (ref 12.0–15.0)
MCH: 30.4 pg (ref 26.0–34.0)
MCHC: 33.2 g/dL (ref 30.0–36.0)
MCV: 91.5 fL (ref 78.0–100.0)
PLATELETS: 181 10*3/uL (ref 150–400)
RBC: 3.52 MIL/uL — AB (ref 3.87–5.11)
RDW: 14.7 % (ref 11.5–15.5)
WBC: 7.8 10*3/uL (ref 4.0–10.5)

## 2014-12-06 LAB — FERRITIN: FERRITIN: 146 ng/mL (ref 10–291)

## 2014-12-06 MED ORDER — SODIUM CHLORIDE 0.9 % IV SOLN
510.0000 mg | Freq: Once | INTRAVENOUS | Status: AC
  Administered 2014-12-06: 510 mg via INTRAVENOUS
  Filled 2014-12-06: qty 17

## 2014-12-06 MED ORDER — SODIUM CHLORIDE 0.9 % IV SOLN: Freq: Once | INTRAVENOUS | Status: DC

## 2014-12-06 NOTE — Patient Instructions (Signed)
..  [place] at [facility] Discharge Instructions  RECOMMENDATIONS MADE BY THE CONSULTANT AND ANY TEST RESULTS WILL BE SENT TO YOUR REFERRING PHYSICIAN.  Labs in 3 months and 6 months Return to see Kara Santos in 6 months Feraheme infusion today and in 1 week We will plan on Feraheme 2 doses in 3 months and 6 months  Thank you for choosing [place] at [facility] to provide your oncology and hematology care.  To afford each patient quality time with our provider, please arrive at least 15 minutes before your scheduled appointment time.    You need to re-schedule your appointment should you arrive 10 or more minutes late.  We strive to give you quality time with our providers, and arriving late affects you and other patients whose appointments are after yours.  Also, if you no show three or more times for appointments you may be dismissed from the clinic at the providers discretion.     Again, thank you for choosing [facility].  Our hope is that these requests will decrease the amount of time that you wait before being seen by our physicians.       _____________________________________________________________  Should you have questions after your visit to [facility], please contact our office at [phone] between the hours of 8:30 a.m. and 4:30 p.m.  Voicemails left after 4:30 p.m. will not be returned until the following business day.  For prescription refill requests, have your pharmacy contact our office.

## 2014-12-06 NOTE — Progress Notes (Signed)
LABS FOR FERR CBC

## 2014-12-07 DIAGNOSIS — D509 Iron deficiency anemia, unspecified: Secondary | ICD-10-CM | POA: Diagnosis not present

## 2014-12-07 DIAGNOSIS — E119 Type 2 diabetes mellitus without complications: Secondary | ICD-10-CM | POA: Diagnosis not present

## 2014-12-07 DIAGNOSIS — S2241XD Multiple fractures of ribs, right side, subsequent encounter for fracture with routine healing: Secondary | ICD-10-CM | POA: Diagnosis not present

## 2014-12-07 DIAGNOSIS — Z9181 History of falling: Secondary | ICD-10-CM | POA: Diagnosis not present

## 2014-12-07 DIAGNOSIS — G309 Alzheimer's disease, unspecified: Secondary | ICD-10-CM | POA: Diagnosis not present

## 2014-12-08 DIAGNOSIS — E119 Type 2 diabetes mellitus without complications: Secondary | ICD-10-CM | POA: Diagnosis not present

## 2014-12-08 DIAGNOSIS — S2241XD Multiple fractures of ribs, right side, subsequent encounter for fracture with routine healing: Secondary | ICD-10-CM | POA: Diagnosis not present

## 2014-12-08 DIAGNOSIS — G309 Alzheimer's disease, unspecified: Secondary | ICD-10-CM | POA: Diagnosis not present

## 2014-12-08 DIAGNOSIS — D509 Iron deficiency anemia, unspecified: Secondary | ICD-10-CM | POA: Diagnosis not present

## 2014-12-08 DIAGNOSIS — Z9181 History of falling: Secondary | ICD-10-CM | POA: Diagnosis not present

## 2014-12-11 DIAGNOSIS — G309 Alzheimer's disease, unspecified: Secondary | ICD-10-CM | POA: Diagnosis not present

## 2014-12-11 DIAGNOSIS — E119 Type 2 diabetes mellitus without complications: Secondary | ICD-10-CM | POA: Diagnosis not present

## 2014-12-11 DIAGNOSIS — D509 Iron deficiency anemia, unspecified: Secondary | ICD-10-CM | POA: Diagnosis not present

## 2014-12-11 DIAGNOSIS — Z9181 History of falling: Secondary | ICD-10-CM | POA: Diagnosis not present

## 2014-12-11 DIAGNOSIS — S2241XD Multiple fractures of ribs, right side, subsequent encounter for fracture with routine healing: Secondary | ICD-10-CM | POA: Diagnosis not present

## 2014-12-12 DIAGNOSIS — S2241XD Multiple fractures of ribs, right side, subsequent encounter for fracture with routine healing: Secondary | ICD-10-CM | POA: Diagnosis not present

## 2014-12-12 DIAGNOSIS — Z9181 History of falling: Secondary | ICD-10-CM | POA: Diagnosis not present

## 2014-12-12 DIAGNOSIS — G309 Alzheimer's disease, unspecified: Secondary | ICD-10-CM | POA: Diagnosis not present

## 2014-12-12 DIAGNOSIS — D509 Iron deficiency anemia, unspecified: Secondary | ICD-10-CM | POA: Diagnosis not present

## 2014-12-12 DIAGNOSIS — E119 Type 2 diabetes mellitus without complications: Secondary | ICD-10-CM | POA: Diagnosis not present

## 2014-12-13 ENCOUNTER — Encounter (HOSPITAL_COMMUNITY): Payer: Self-pay

## 2014-12-13 ENCOUNTER — Ambulatory Visit (HOSPITAL_COMMUNITY): Payer: Medicare Other

## 2014-12-13 ENCOUNTER — Encounter (HOSPITAL_BASED_OUTPATIENT_CLINIC_OR_DEPARTMENT_OTHER): Payer: Medicare Other

## 2014-12-13 DIAGNOSIS — D509 Iron deficiency anemia, unspecified: Secondary | ICD-10-CM

## 2014-12-13 DIAGNOSIS — K922 Gastrointestinal hemorrhage, unspecified: Secondary | ICD-10-CM

## 2014-12-13 DIAGNOSIS — D500 Iron deficiency anemia secondary to blood loss (chronic): Secondary | ICD-10-CM | POA: Diagnosis not present

## 2014-12-13 DIAGNOSIS — E119 Type 2 diabetes mellitus without complications: Secondary | ICD-10-CM | POA: Diagnosis not present

## 2014-12-13 DIAGNOSIS — Z9181 History of falling: Secondary | ICD-10-CM | POA: Diagnosis not present

## 2014-12-13 DIAGNOSIS — K31811 Angiodysplasia of stomach and duodenum with bleeding: Secondary | ICD-10-CM

## 2014-12-13 DIAGNOSIS — G309 Alzheimer's disease, unspecified: Secondary | ICD-10-CM | POA: Diagnosis not present

## 2014-12-13 DIAGNOSIS — S2241XD Multiple fractures of ribs, right side, subsequent encounter for fracture with routine healing: Secondary | ICD-10-CM | POA: Diagnosis not present

## 2014-12-13 MED ORDER — FERUMOXYTOL INJECTION 510 MG/17 ML
510.0000 mg | Freq: Once | INTRAVENOUS | Status: AC
  Administered 2014-12-13: 510 mg via INTRAVENOUS
  Filled 2014-12-13: qty 17

## 2014-12-13 MED ORDER — SODIUM CHLORIDE 0.9 % IJ SOLN
10.0000 mL | Freq: Once | INTRAMUSCULAR | Status: AC
  Administered 2014-12-13: 10 mL via INTRAVENOUS

## 2014-12-13 MED ORDER — SODIUM CHLORIDE 0.9 % IV SOLN
Freq: Once | INTRAVENOUS | Status: AC
  Administered 2014-12-13: 11:00:00 via INTRAVENOUS

## 2014-12-13 NOTE — Progress Notes (Signed)
Tolerated IV iron infusion well.

## 2014-12-13 NOTE — Patient Instructions (Signed)
[place] at [facility] Discharge Instructions  RECOMMENDATIONS MADE BY THE CONSULTANT AND ANY TEST RESULTS WILL BE SENT TO YOUR REFERRING PHYSICIAN.  We will see you back in June for labs and another infusion. Call for any concerns or questions.  Thank you for choosing [place] at [facility] to provide your oncology and hematology care.  To afford each patient quality time with our provider, please arrive at least 15 minutes before your scheduled appointment time.    You need to re-schedule your appointment should you arrive 10 or more minutes late.  We strive to give you quality time with our providers, and arriving late affects you and other patients whose appointments are after yours.  Also, if you no show three or more times for appointments you may be dismissed from the clinic at the providers discretion.     Again, thank you for choosing [facility].  Our hope is that these requests will decrease the amount of time that you wait before being seen by our physicians.       _____________________________________________________________  Should you have questions after your visit to [facility], please contact our office at [phone] between the hours of 8:30 a.m. and 4:30 p.m.  Voicemails left after 4:30 p.m. will not be returned until the following business day.  For prescription refill requests, have your pharmacy contact our office.

## 2014-12-15 DIAGNOSIS — G309 Alzheimer's disease, unspecified: Secondary | ICD-10-CM | POA: Diagnosis not present

## 2014-12-15 DIAGNOSIS — Z9181 History of falling: Secondary | ICD-10-CM | POA: Diagnosis not present

## 2014-12-15 DIAGNOSIS — D509 Iron deficiency anemia, unspecified: Secondary | ICD-10-CM | POA: Diagnosis not present

## 2014-12-15 DIAGNOSIS — E119 Type 2 diabetes mellitus without complications: Secondary | ICD-10-CM | POA: Diagnosis not present

## 2014-12-15 DIAGNOSIS — S2241XD Multiple fractures of ribs, right side, subsequent encounter for fracture with routine healing: Secondary | ICD-10-CM | POA: Diagnosis not present

## 2014-12-19 DIAGNOSIS — G309 Alzheimer's disease, unspecified: Secondary | ICD-10-CM | POA: Diagnosis not present

## 2014-12-19 DIAGNOSIS — E119 Type 2 diabetes mellitus without complications: Secondary | ICD-10-CM | POA: Diagnosis not present

## 2014-12-19 DIAGNOSIS — Z9181 History of falling: Secondary | ICD-10-CM | POA: Diagnosis not present

## 2014-12-19 DIAGNOSIS — S2241XD Multiple fractures of ribs, right side, subsequent encounter for fracture with routine healing: Secondary | ICD-10-CM | POA: Diagnosis not present

## 2014-12-19 DIAGNOSIS — D509 Iron deficiency anemia, unspecified: Secondary | ICD-10-CM | POA: Diagnosis not present

## 2014-12-20 DIAGNOSIS — E119 Type 2 diabetes mellitus without complications: Secondary | ICD-10-CM | POA: Diagnosis not present

## 2014-12-20 DIAGNOSIS — G309 Alzheimer's disease, unspecified: Secondary | ICD-10-CM | POA: Diagnosis not present

## 2014-12-20 DIAGNOSIS — Z9181 History of falling: Secondary | ICD-10-CM | POA: Diagnosis not present

## 2014-12-20 DIAGNOSIS — S2241XD Multiple fractures of ribs, right side, subsequent encounter for fracture with routine healing: Secondary | ICD-10-CM | POA: Diagnosis not present

## 2014-12-20 DIAGNOSIS — D509 Iron deficiency anemia, unspecified: Secondary | ICD-10-CM | POA: Diagnosis not present

## 2014-12-21 DIAGNOSIS — Z9181 History of falling: Secondary | ICD-10-CM | POA: Diagnosis not present

## 2014-12-21 DIAGNOSIS — S2241XD Multiple fractures of ribs, right side, subsequent encounter for fracture with routine healing: Secondary | ICD-10-CM | POA: Diagnosis not present

## 2014-12-21 DIAGNOSIS — E119 Type 2 diabetes mellitus without complications: Secondary | ICD-10-CM | POA: Diagnosis not present

## 2014-12-21 DIAGNOSIS — G309 Alzheimer's disease, unspecified: Secondary | ICD-10-CM | POA: Diagnosis not present

## 2014-12-21 DIAGNOSIS — D509 Iron deficiency anemia, unspecified: Secondary | ICD-10-CM | POA: Diagnosis not present

## 2015-01-01 DIAGNOSIS — H3531 Nonexudative age-related macular degeneration: Secondary | ICD-10-CM | POA: Diagnosis not present

## 2015-01-01 DIAGNOSIS — E119 Type 2 diabetes mellitus without complications: Secondary | ICD-10-CM | POA: Diagnosis not present

## 2015-01-01 DIAGNOSIS — Z961 Presence of intraocular lens: Secondary | ICD-10-CM | POA: Diagnosis not present

## 2015-01-01 DIAGNOSIS — H04123 Dry eye syndrome of bilateral lacrimal glands: Secondary | ICD-10-CM | POA: Diagnosis not present

## 2015-03-08 ENCOUNTER — Encounter (HOSPITAL_COMMUNITY): Payer: Medicare Other

## 2015-03-08 ENCOUNTER — Encounter (HOSPITAL_COMMUNITY): Payer: Self-pay

## 2015-03-08 ENCOUNTER — Encounter (HOSPITAL_COMMUNITY): Payer: Medicare Other | Attending: Internal Medicine

## 2015-03-08 ENCOUNTER — Encounter (HOSPITAL_BASED_OUTPATIENT_CLINIC_OR_DEPARTMENT_OTHER): Payer: Medicare Other

## 2015-03-08 VITALS — BP 126/58 | HR 71 | Temp 97.3°F | Resp 20

## 2015-03-08 DIAGNOSIS — D62 Acute posthemorrhagic anemia: Secondary | ICD-10-CM | POA: Insufficient documentation

## 2015-03-08 DIAGNOSIS — K31811 Angiodysplasia of stomach and duodenum with bleeding: Secondary | ICD-10-CM | POA: Diagnosis not present

## 2015-03-08 DIAGNOSIS — D509 Iron deficiency anemia, unspecified: Secondary | ICD-10-CM | POA: Insufficient documentation

## 2015-03-08 DIAGNOSIS — D500 Iron deficiency anemia secondary to blood loss (chronic): Secondary | ICD-10-CM

## 2015-03-08 DIAGNOSIS — K922 Gastrointestinal hemorrhage, unspecified: Secondary | ICD-10-CM

## 2015-03-08 LAB — FERRITIN: Ferritin: 77 ng/mL (ref 11–307)

## 2015-03-08 LAB — CBC
HCT: 31.6 % — ABNORMAL LOW (ref 36.0–46.0)
Hemoglobin: 10.6 g/dL — ABNORMAL LOW (ref 12.0–15.0)
MCH: 31 pg (ref 26.0–34.0)
MCHC: 33.5 g/dL (ref 30.0–36.0)
MCV: 92.4 fL (ref 78.0–100.0)
Platelets: 144 10*3/uL — ABNORMAL LOW (ref 150–400)
RBC: 3.42 MIL/uL — ABNORMAL LOW (ref 3.87–5.11)
RDW: 14.4 % (ref 11.5–15.5)
WBC: 6.9 10*3/uL (ref 4.0–10.5)

## 2015-03-08 MED ORDER — SODIUM CHLORIDE 0.9 % IV SOLN
510.0000 mg | Freq: Once | INTRAVENOUS | Status: AC
  Administered 2015-03-08: 510 mg via INTRAVENOUS
  Filled 2015-03-08: qty 17

## 2015-03-08 MED ORDER — SODIUM CHLORIDE 0.9 % IV SOLN
Freq: Once | INTRAVENOUS | Status: AC
  Administered 2015-03-08: 14:00:00 via INTRAVENOUS

## 2015-03-08 NOTE — Progress Notes (Signed)
Tolerated IV iron infusion well.

## 2015-03-08 NOTE — Patient Instructions (Signed)
[place] at [facility] Discharge Instructions  RECOMMENDATIONS MADE BY THE CONSULTANT AND ANY TEST RESULTS WILL BE SENT TO YOUR REFERRING PHYSICIAN.  You received IV iron today. We will see you at your next appointment. Call for any concerns or questions.  Thank you for choosing [place] at [facility] to provide your oncology and hematology care.  To afford each patient quality time with our provider, please arrive at least 15 minutes before your scheduled appointment time.    You need to re-schedule your appointment should you arrive 10 or more minutes late.  We strive to give you quality time with our providers, and arriving late affects you and other patients whose appointments are after yours.  Also, if you no show three or more times for appointments you may be dismissed from the clinic at the providers discretion.     Again, thank you for choosing [facility].  Our hope is that these requests will decrease the amount of time that you wait before being seen by our physicians.       _____________________________________________________________  Should you have questions after your visit to [facility], please contact our office at [phone] between the hours of 8:30 a.m. and 4:30 p.m.  Voicemails left after 4:30 p.m. will not be returned until the following business day.  For prescription refill requests, have your pharmacy contact our office.

## 2015-03-09 NOTE — Progress Notes (Signed)
LABS DRAWN

## 2015-06-05 ENCOUNTER — Other Ambulatory Visit (HOSPITAL_COMMUNITY): Payer: Self-pay

## 2015-06-05 DIAGNOSIS — D509 Iron deficiency anemia, unspecified: Secondary | ICD-10-CM

## 2015-06-08 ENCOUNTER — Encounter (HOSPITAL_COMMUNITY): Payer: Medicare Other

## 2015-06-08 ENCOUNTER — Ambulatory Visit (HOSPITAL_COMMUNITY): Payer: PRIVATE HEALTH INSURANCE

## 2015-06-08 ENCOUNTER — Encounter (HOSPITAL_BASED_OUTPATIENT_CLINIC_OR_DEPARTMENT_OTHER): Payer: Medicare Other

## 2015-06-08 ENCOUNTER — Encounter (HOSPITAL_COMMUNITY): Payer: Self-pay | Admitting: Oncology

## 2015-06-08 ENCOUNTER — Ambulatory Visit (HOSPITAL_COMMUNITY): Payer: PRIVATE HEALTH INSURANCE | Admitting: Oncology

## 2015-06-08 ENCOUNTER — Encounter (HOSPITAL_COMMUNITY): Payer: Medicare Other | Attending: Oncology | Admitting: Oncology

## 2015-06-08 VITALS — BP 115/76 | HR 83 | Temp 97.9°F | Resp 20 | Wt 101.5 lb

## 2015-06-08 DIAGNOSIS — K922 Gastrointestinal hemorrhage, unspecified: Secondary | ICD-10-CM

## 2015-06-08 DIAGNOSIS — K31811 Angiodysplasia of stomach and duodenum with bleeding: Secondary | ICD-10-CM | POA: Insufficient documentation

## 2015-06-08 DIAGNOSIS — D509 Iron deficiency anemia, unspecified: Secondary | ICD-10-CM

## 2015-06-08 LAB — CBC
HCT: 29.1 % — ABNORMAL LOW (ref 36.0–46.0)
Hemoglobin: 9.8 g/dL — ABNORMAL LOW (ref 12.0–15.0)
MCH: 31.1 pg (ref 26.0–34.0)
MCHC: 33.7 g/dL (ref 30.0–36.0)
MCV: 92.4 fL (ref 78.0–100.0)
Platelets: 199 10*3/uL (ref 150–400)
RBC: 3.15 MIL/uL — AB (ref 3.87–5.11)
RDW: 15.4 % (ref 11.5–15.5)
WBC: 8 10*3/uL (ref 4.0–10.5)

## 2015-06-08 LAB — FERRITIN: Ferritin: 53 ng/mL (ref 11–307)

## 2015-06-08 MED ORDER — SODIUM CHLORIDE 0.9 % IV SOLN
Freq: Once | INTRAVENOUS | Status: AC
  Administered 2015-06-08: 13:00:00 via INTRAVENOUS

## 2015-06-08 MED ORDER — SODIUM CHLORIDE 0.9 % IV SOLN
510.0000 mg | Freq: Once | INTRAVENOUS | Status: AC
  Administered 2015-06-08: 510 mg via INTRAVENOUS
  Filled 2015-06-08: qty 17

## 2015-06-08 NOTE — Progress Notes (Signed)
Kara Bogus, MD [place] [address]  Iron deficiency anemia  CURRENT THERAPY: IV Feraheme 510 mg day 1 and 8 every 3 months without waiting on ferritin level  INTERVAL HISTORY: Kara Santos 81 y.o. female returns for followup of iron deficiency anemia requiring intravenous iron. 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  I personally reviewed and went over laboratory results with the patient.  The results are noted within this dictation.  They will be updated today.  I have a supportive therapy plan built that consists of IV Feraheme 510 mg on days 1 and 8 every 3 months.  Due to patient's difficulty getting to appointments, I dropped day 8 in the past.  However, after reviewing the patient's ferritin levels, I have not been able to maintain her ferritin at 100 or greater between lab visits.  Additionally, she notes that she is more tired and fatigued than in the past.  This could certainly be multifactorial.  Depending on today's ferritin level, I will cancel day 8 of treatment.  But, if below 100, I will give 510 mg x 2.  We will see if that helps with her symptoms.  Hematologically, she denies any complaints and ROS questioning is negative.  Past Medical History  Diagnosis Date  . Anemia 11/07/2011    chronic IDA requiring multiple transfusions  . Diabetes mellitus   . Hypothyroidism     s/p thyroid surgery for thyroid cancer  . GERD (gastroesophageal reflux disease)   . Iron deficiency anemia 05/05/2012  . Depression   . Closed fracture of greater trochanter of femur 02/2013    left  . AVM (arteriovenous malformation) of stomach, acquired with hemorrhage 12/04/2013  . Alzheimer's disease, early onset 69  . Fall at home 11/17/14    multiple rib fractures    has Chronic gastrointestinal bleeding; Diabetes mellitus type 2 in nonobese; Hypothyroidism; History of thyroid cancer;  Iron deficiency anemia; Closed fracture of greater trochanter of femur; History of fall; AVM (arteriovenous malformation) of stomach, acquired with hemorrhage; Multiple rib fractures; and Fall on her problem list.     is allergic to asa.  Ms. Riordan does not currently have medications on file.  Past Surgical History  Procedure Laterality Date  . Esophagogastroduodenoscopy  08/2006    Dr. Trevor Iha [facility], large duodenal diverticulum  . Colonoscopy  08/2006    Dr. Cecelia Byars sided diverticulosis  . Givens small bowel capsule  08/2006    few gastric erosions, numerous small bowel AVMs with SB erosions  . Double balloon enteroscopy  06/1008    [facility]-->APC of 12 SB AVMs in jejunum. 6 feet of jejunum inspected  . Right shoulder surgery    . Cholecystectomy    . Appendectomy    . Bilateral cataract extractions    . Breast reduction surgery    . Thyroid surgery      thyroid cancer    Denies any headaches, dizziness, double vision, fevers, chills, night sweats, nausea, vomiting, diarrhea, constipation, chest pain, heart palpitations, shortness of breath, blood in stool, black tarry stool, urinary pain, urinary burning, urinary frequency, hematuria.   PHYSICAL EXAMINATION  ECOG PERFORMANCE STATUS: 1 - Symptomatic but completely ambulatory  Filed Vitals:   06/08/15 1232  BP: 115/76  Pulse: 83  Temp: 97.9 F (36.6 C)  Resp: 20    GENERAL:alert, no distress, well nourished, well developed, comfortable,  cooperative and smiling SKIN: skin color, texture, turgor are normal, no rashes or significant lesions HEAD: Normocephalic, No masses, lesions, tenderness or abnormalities EYES: normal, PERRLA, EOMI, Conjunctiva are pink and non-injected EARS: External ears normal OROPHARYNX:lips, buccal mucosa, and tongue normal and mucous membranes are moist  NECK: supple, no stridor, trachea midline LYMPH:  not examined BREAST:not examined LUNGS: clear to auscultation  HEART: regular rate &  rhythm, no murmurs and no gallops ABDOMEN:normal bowel sounds BACK: Back symmetric, no curvature. EXTREMITIES:less then 2 second capillary refill, no joint deformities, effusion, or inflammation, no skin discoloration, no cyanosis  NEURO: alert & oriented x 3 with fluent speech, no focal motor/sensory deficits, gait normal   LABORATORY DATA: CBC    Component Value Date/Time   WBC 8.0 06/08/2015 1245   RBC 3.15* 06/08/2015 1245   RBC 3.96 11/21/2009 2230   HGB 9.8* 06/08/2015 1245   HCT 29.1* 06/08/2015 1245   PLT 199 06/08/2015 1245   MCV 92.4 06/08/2015 1245   MCH 31.1 06/08/2015 1245   MCHC 33.7 06/08/2015 1245   RDW 15.4 06/08/2015 1245   LYMPHSABS 1.2 11/19/2014 0252   MONOABS 0.6 11/19/2014 0252   EOSABS 0.1 11/19/2014 0252   BASOSABS 0.0 11/19/2014 0252      Chemistry      Component Value Date/Time   NA 141 11/18/2014 0329   K 3.5 11/18/2014 0329   CL 108 11/18/2014 0329   CO2 26 11/18/2014 0329   BUN 9 11/18/2014 0329   CREATININE 0.67 11/18/2014 0329      Component Value Date/Time   CALCIUM 8.0* 11/18/2014 0329   ALKPHOS 71 11/17/2014 1343   AST 122* 11/17/2014 1343   ALT 64* 11/17/2014 1343   BILITOT 0.7 11/17/2014 1343      Lab Results  Component Value Date   IRON 77 09/14/2013   TIBC 228* 09/14/2013   FERRITIN 77 03/08/2015     RADIOGRAPHIC STUDIES:  No results found.   ASSESSMENT AND PLAN:  Iron deficiency anemia Secondary to AVM and chronic blood loss requiring IV iron replacement every 3 months.  Supportive therapy plan developed to provide patient's historical requirement for iron and this plan is reviewed today.    Labs today as planned: CBC diff, ferritin.   IV Feraheme 510 mg today and next week.    Labs in 3 and 6 months: CBC diff, ferritin.    IV Feraheme 510 mg on day 1 and 8 in 3 months and 6 months.    I have a supportive therapy plan built that consists of IV Feraheme 510 mg on days 1 and 8 every 3 months.  Due to  patient's difficulty getting to appointments, I dropped day 8 in the past.  However, after reviewing the patient's ferritin levels, I have not been able to maintain her ferritin at 100 or greater between lab visits.  Additionally, she notes that she is more tired and fatigued than in the past.  This could certainly be multifactorial.  Depending on today's ferritin level, I will cancel day 8 of treatment.  But, if below 100, I will give 510 mg x 2.  We will see if that helps with her symptoms.   Another option is to change her therapy plan to IV Feraheme 510 mg single dose every 2 months.  This would allow closer surveillance of ferritin levels in an attempt to diminish the risk of over-estimating her iron deficit needs and giving too much iron.  I will see what  her ferritin is today and make decisions based upon that.  We will not wait on lab work for IV Feraheme today and proceed as planned.    Return in 6 months for follow-up.    THERAPY PLAN:  We will continue to monitor counts and provide IV Feraheme in a day 1 and 8 fashion every 3 months.  Frequency and dosing of supportive therapy plan can be altered as needed.  All questions were answered. The patient knows to call the clinic with any problems, questions or concerns. We can certainly see the patient much sooner if necessary.  Patient and plan discussed with Dr. Ancil Linsey and she is in agreement with the aforementioned.   This note is electronically signed by: [name] 06/08/2015 3:54 PM

## 2015-06-08 NOTE — Assessment & Plan Note (Addendum)
Secondary to AVM and chronic blood loss requiring IV iron replacement every 3 months.  Supportive therapy plan developed to provide patient's historical requirement for iron and this plan is reviewed today.    Labs today as planned: CBC diff, ferritin.   IV Feraheme 510 mg today and next week.    Labs in 3 and 6 months: CBC diff, ferritin.    IV Feraheme 510 mg on day 1 and 8 in 3 months and 6 months.    I have a supportive therapy plan built that consists of IV Feraheme 510 mg on days 1 and 8 every 3 months.  Due to patient's difficulty getting to appointments, I dropped day 8 in the past.  However, after reviewing the patient's ferritin levels, I have not been able to maintain her ferritin at 100 or greater between lab visits.  Additionally, she notes that she is more tired and fatigued than in the past.  This could certainly be multifactorial.  Depending on today's ferritin level, I will cancel day 8 of treatment.  But, if below 100, I will give 510 mg x 2.  We will see if that helps with her symptoms.   Another option is to change her therapy plan to IV Feraheme 510 mg single dose every 2 months.  This would allow closer surveillance of ferritin levels in an attempt to diminish the risk of over-estimating her iron deficit needs and giving too much iron.  I will see what her ferritin is today and make decisions based upon that.  We will not wait on lab work for IV Feraheme today and proceed as planned.    Return in 6 months for follow-up.

## 2015-06-08 NOTE — Progress Notes (Signed)
Tolerated infusion w/o adverse reaction.  A&Ox4; in no distress.  VSS.  Discharged ambulatory in c/o daughter for transport home.  See office visit encounter for further.

## 2015-06-08 NOTE — Patient Instructions (Signed)
[facility] Discharge Instructions  RECOMMENDATIONS MADE BY THE CONSULTANT AND ANY TEST RESULTS WILL BE SENT TO YOUR REFERRING PHYSICIAN.  Exam and discussion by [name], PA-C. Will schedule you for feraheme next week also.  If your ferritin is up we will cancel that appointment. Report increased fatigue or shortness of breath.  Labs every 3 months Feraheme every 3 months  Office visit in 6 months.  Thank you for choosing [place] [facility] to provide your oncology and hematology care.  To afford each patient quality time with our provider, please arrive at least 15 minutes before your scheduled appointment time.    You need to re-schedule your appointment should you arrive 10 or more minutes late.  We strive to give you quality time with our providers, and arriving late affects you and other patients whose appointments are after yours.  Also, if you no show three or more times for appointments you may be dismissed from the clinic at the providers discretion.     Again, thank you for choosing [facility]  Our hope is that these requests will decrease the amount of time that you wait before being seen by our physicians.       _____________________________________________________________  Should you have questions after your visit to [facility], please contact our office at [phone] between the hours of 8:30 a.m. and 4:30 p.m.  Voicemails left after 4:30 p.m. will not be returned until the following business day.  For prescription refill requests, have your pharmacy contact our office.

## 2015-06-15 ENCOUNTER — Encounter (HOSPITAL_BASED_OUTPATIENT_CLINIC_OR_DEPARTMENT_OTHER): Payer: Medicare Other

## 2015-06-15 VITALS — BP 129/64 | HR 71 | Temp 98.0°F | Resp 16

## 2015-06-15 DIAGNOSIS — D509 Iron deficiency anemia, unspecified: Secondary | ICD-10-CM | POA: Diagnosis present

## 2015-06-15 MED ORDER — SODIUM CHLORIDE 0.9 % IV SOLN
INTRAVENOUS | Status: DC
  Administered 2015-06-15: 11:00:00 via INTRAVENOUS

## 2015-06-15 MED ORDER — SODIUM CHLORIDE 0.9 % IV SOLN
510.0000 mg | Freq: Once | INTRAVENOUS | Status: AC
  Administered 2015-06-15: 510 mg via INTRAVENOUS
  Filled 2015-06-15: qty 17

## 2015-06-15 NOTE — Progress Notes (Signed)
Patient tolerated infusion well.  VSS post infusion.   

## 2015-08-04 ENCOUNTER — Emergency Department (HOSPITAL_COMMUNITY): Payer: Medicare Other

## 2015-08-04 ENCOUNTER — Emergency Department (HOSPITAL_COMMUNITY)
Admission: EM | Admit: 2015-08-04 | Discharge: 2015-08-04 | Disposition: A | Discharge status: Home / Self Care | Payer: Medicare Other | Source: Home / Self Care | Attending: Emergency Medicine | Admitting: Emergency Medicine

## 2015-08-04 ENCOUNTER — Encounter (HOSPITAL_COMMUNITY): Payer: Self-pay | Admitting: *Deleted

## 2015-08-04 DIAGNOSIS — Z9181 History of falling: Secondary | ICD-10-CM

## 2015-08-04 DIAGNOSIS — G9340 Encephalopathy, unspecified: Secondary | ICD-10-CM | POA: Diagnosis not present

## 2015-08-04 DIAGNOSIS — M7989 Other specified soft tissue disorders: Secondary | ICD-10-CM | POA: Diagnosis not present

## 2015-08-04 DIAGNOSIS — N390 Urinary tract infection, site not specified: Secondary | ICD-10-CM | POA: Diagnosis not present

## 2015-08-04 DIAGNOSIS — M25552 Pain in left hip: Secondary | ICD-10-CM | POA: Diagnosis not present

## 2015-08-04 DIAGNOSIS — S0181XA Laceration without foreign body of other part of head, initial encounter: Secondary | ICD-10-CM | POA: Diagnosis present

## 2015-08-04 DIAGNOSIS — D509 Iron deficiency anemia, unspecified: Secondary | ICD-10-CM | POA: Diagnosis present

## 2015-08-04 DIAGNOSIS — W19XXXA Unspecified fall, initial encounter: Secondary | ICD-10-CM

## 2015-08-04 DIAGNOSIS — S02611A Fracture of condylar process of right mandible, initial encounter for closed fracture: Secondary | ICD-10-CM | POA: Diagnosis not present

## 2015-08-04 DIAGNOSIS — E119 Type 2 diabetes mellitus without complications: Secondary | ICD-10-CM | POA: Diagnosis not present

## 2015-08-04 DIAGNOSIS — R109 Unspecified abdominal pain: Secondary | ICD-10-CM | POA: Diagnosis not present

## 2015-08-04 DIAGNOSIS — K219 Gastro-esophageal reflux disease without esophagitis: Secondary | ICD-10-CM | POA: Diagnosis present

## 2015-08-04 DIAGNOSIS — S02609A Fracture of mandible, unspecified, initial encounter for closed fracture: Secondary | ICD-10-CM | POA: Diagnosis not present

## 2015-08-04 DIAGNOSIS — B961 Klebsiella pneumoniae [K. pneumoniae] as the cause of diseases classified elsewhere: Secondary | ICD-10-CM | POA: Diagnosis present

## 2015-08-04 DIAGNOSIS — F0280 Dementia in other diseases classified elsewhere without behavioral disturbance: Secondary | ICD-10-CM | POA: Diagnosis not present

## 2015-08-04 DIAGNOSIS — S299XXA Unspecified injury of thorax, initial encounter: Secondary | ICD-10-CM | POA: Diagnosis not present

## 2015-08-04 DIAGNOSIS — K922 Gastrointestinal hemorrhage, unspecified: Principal | ICD-10-CM | POA: Diagnosis present

## 2015-08-04 DIAGNOSIS — G309 Alzheimer's disease, unspecified: Secondary | ICD-10-CM | POA: Diagnosis not present

## 2015-08-04 DIAGNOSIS — R195 Other fecal abnormalities: Secondary | ICD-10-CM | POA: Diagnosis not present

## 2015-08-04 DIAGNOSIS — Z66 Do not resuscitate: Secondary | ICD-10-CM | POA: Diagnosis present

## 2015-08-04 DIAGNOSIS — W1839XA Other fall on same level, initial encounter: Secondary | ICD-10-CM | POA: Diagnosis present

## 2015-08-04 DIAGNOSIS — R296 Repeated falls: Secondary | ICD-10-CM | POA: Diagnosis present

## 2015-08-04 DIAGNOSIS — G300 Alzheimer's disease with early onset: Secondary | ICD-10-CM | POA: Diagnosis present

## 2015-08-04 DIAGNOSIS — S02610A Fracture of condylar process of mandible, unspecified side, initial encounter for closed fracture: Secondary | ICD-10-CM

## 2015-08-04 DIAGNOSIS — E039 Hypothyroidism, unspecified: Secondary | ICD-10-CM | POA: Diagnosis present

## 2015-08-04 DIAGNOSIS — Z85850 Personal history of malignant neoplasm of thyroid: Secondary | ICD-10-CM

## 2015-08-04 DIAGNOSIS — Z809 Family history of malignant neoplasm, unspecified: Secondary | ICD-10-CM

## 2015-08-04 DIAGNOSIS — M25532 Pain in left wrist: Secondary | ICD-10-CM | POA: Diagnosis not present

## 2015-08-04 DIAGNOSIS — R410 Disorientation, unspecified: Secondary | ICD-10-CM | POA: Diagnosis not present

## 2015-08-04 DIAGNOSIS — Z833 Family history of diabetes mellitus: Secondary | ICD-10-CM

## 2015-08-04 DIAGNOSIS — Y92014 Private driveway to single-family (private) house as the place of occurrence of the external cause: Secondary | ICD-10-CM

## 2015-08-04 DIAGNOSIS — F0390 Unspecified dementia without behavioral disturbance: Secondary | ICD-10-CM | POA: Diagnosis not present

## 2015-08-04 DIAGNOSIS — F329 Major depressive disorder, single episode, unspecified: Secondary | ICD-10-CM | POA: Diagnosis present

## 2015-08-04 LAB — COMPREHENSIVE METABOLIC PANEL
ALBUMIN: 4.2 g/dL (ref 3.5–5.0)
ALT: 21 U/L (ref 14–54)
ANION GAP: 9 (ref 5–15)
AST: 30 U/L (ref 15–41)
Alkaline Phosphatase: 55 U/L (ref 38–126)
BILIRUBIN TOTAL: 0.7 mg/dL (ref 0.3–1.2)
BUN: 14 mg/dL (ref 6–20)
CO2: 26 mmol/L (ref 22–32)
Calcium: 8.7 mg/dL — ABNORMAL LOW (ref 8.9–10.3)
Chloride: 107 mmol/L (ref 101–111)
Creatinine, Ser: 0.66 mg/dL (ref 0.44–1.00)
GFR calc Af Amer: >60 mL/min (ref >60)
GFR calc non Af Amer: >60 mL/min (ref >60)
GLUCOSE: 139 mg/dL — AB (ref 65–99)
POTASSIUM: 3.2 mmol/L — AB (ref 3.5–5.1)
SODIUM: 142 mmol/L (ref 135–145)
TOTAL PROTEIN: 6.4 g/dL — AB (ref 6.5–8.1)

## 2015-08-04 LAB — URINALYSIS, ROUTINE W REFLEX MICROSCOPIC
Bilirubin Urine: NEGATIVE
Glucose, UA: NEGATIVE mg/dL
Hgb urine dipstick: NEGATIVE
Ketones, ur: NEGATIVE mg/dL
Nitrite: NEGATIVE
PH: 6.5 (ref 5.0–8.0)
Protein, ur: NEGATIVE mg/dL
SPECIFIC GRAVITY, URINE: 1.015 (ref 1.005–1.030)
Urobilinogen, UA: 0.2 mg/dL (ref 0.0–1.0)

## 2015-08-04 LAB — CBC WITH DIFFERENTIAL/PLATELET
BASOS ABS: 0 10*3/uL (ref 0.0–0.1)
Basophils Relative: 1 %
Eosinophils Absolute: 0.1 10*3/uL (ref 0.0–0.7)
Eosinophils Relative: 1 %
HEMATOCRIT: 32.3 % — AB (ref 36.0–46.0)
Hemoglobin: 10.9 g/dL — ABNORMAL LOW (ref 12.0–15.0)
LYMPHS PCT: 13 %
Lymphs Abs: 0.8 10*3/uL (ref 0.7–4.0)
MCH: 31 pg (ref 26.0–34.0)
MCHC: 33.7 g/dL (ref 30.0–36.0)
MCV: 91.8 fL (ref 78.0–100.0)
MONO ABS: 0.3 10*3/uL (ref 0.1–1.0)
Monocytes Relative: 5 %
NEUTROS ABS: 4.8 10*3/uL (ref 1.7–7.7)
Neutrophils Relative %: 80 %
Platelets: 130 10*3/uL — ABNORMAL LOW (ref 150–400)
RBC: 3.52 MIL/uL — ABNORMAL LOW (ref 3.87–5.11)
RDW: 14.2 % (ref 11.5–15.5)
WBC: 6 10*3/uL (ref 4.0–10.5)

## 2015-08-04 LAB — CBG MONITORING, ED: GLUCOSE-CAPILLARY: 129 mg/dL — AB (ref 65–99)

## 2015-08-04 LAB — URINE MICROSCOPIC-ADD ON

## 2015-08-04 MED ORDER — LIDOCAINE-EPINEPHRINE-TETRACAINE (LET) TOPICAL GEL
3.0000 mL | Freq: Once | TOPICAL | Status: AC
  Administered 2015-08-04: 3 mL via TOPICAL

## 2015-08-04 MED ORDER — LIDOCAINE-EPINEPHRINE-TETRACAINE (LET) SOLUTION
NASAL | Status: AC
  Filled 2015-08-04: qty 3

## 2015-08-04 MED ORDER — LIDOCAINE HCL (PF) 1 % IJ SOLN
INTRAMUSCULAR | Status: AC
  Filled 2015-08-04: qty 5

## 2015-08-04 NOTE — ED Notes (Signed)
Patient ambulated around nursing station with minimal assistance. No complaints.

## 2015-08-04 NOTE — ED Provider Notes (Signed)
CSN: [id number]     Arrival date & time 08/04/15  1218 History   First MD Initiated Contact with Patient 08/04/15 1232     Chief Complaint  Patient presents with  . Fall     Patient is a 81 y.o. female presenting with fall. The history is provided by the patient and a relative. No language interpreter was used.  Fall   Ms. Sterbenz presents for evaluation of injuries from fall. History is provided by the daughter. Level V caveat due to dementia. Ms. Katich was sweeping the driveway and fell to the ground just prior to ED arrival. She has a history of falls with no preceding symptoms. She struck her chin bilateral hands, left hip. No loss of consciousness, no vomiting. She claims the pain to her chin and her hip.  Past Medical History  Diagnosis Date  . Anemia 11/07/2011    chronic IDA requiring multiple transfusions  . Diabetes mellitus   . Hypothyroidism     s/p thyroid surgery for thyroid cancer  . GERD (gastroesophageal reflux disease)   . Iron deficiency anemia 05/05/2012  . Depression   . Closed fracture of greater trochanter of femur ([place]) 02/2013    left  . AVM (arteriovenous malformation) of stomach, acquired with hemorrhage 12/04/2013  . Alzheimer's disease, early onset 75  . Fall at home 11/17/14    multiple rib fractures   Past Surgical History  Procedure Laterality Date  . Esophagogastroduodenoscopy  08/2006    Dr. Trevor Iha [facility], large duodenal diverticulum  . Colonoscopy  08/2006    Dr. Cecelia Byars sided diverticulosis  . Givens small bowel capsule  08/2006    few gastric erosions, numerous small bowel AVMs with SB erosions  . Double balloon enteroscopy  06/1008    [facility]-->APC of 12 SB AVMs in jejunum. 6 feet of jejunum inspected  . Right shoulder surgery    . Cholecystectomy    . Appendectomy    . Bilateral cataract extractions    . Breast reduction surgery    . Thyroid surgery      thyroid cancer   Family History  Problem Relation Age of Onset  . Cancer  Mother   . Cancer Father   . Diabetes Father   . Diabetes Sister   . Cancer Brother    Social History  Substance Use Topics  . Smoking status: Never Smoker   . Smokeless tobacco: Never Used  . Alcohol Use: No   OB History    No data available     Review of Systems  All other systems reviewed and are negative.     Allergies  Asa  Home Medications   Prior to Admission medications   Medication Sig Start Date End Date Taking? Authorizing Provider  acetaminophen (TYLENOL) 325 MG tablet Take 650 mg by mouth every 4 (four) hours as needed for pain.    Historical Provider, MD  cyanocobalamin 100 MCG tablet Take 100 mcg by mouth daily.    Historical Provider, MD  donepezil (ARICEPT) 5 MG tablet Take 5 mg by mouth at bedtime.    Historical Provider, MD  escitalopram (LEXAPRO) 10 MG tablet TAKE ONE TABLET BY MOUTH ONCE DAILY. 10/02/14   Baird Cancer, PA-C  lansoprazole (PREVACID) 30 MG capsule Take 30 mg by mouth daily at 12 noon.    Historical Provider, MD  levothyroxine (SYNTHROID, LEVOTHROID) 100 MCG tablet Take 100 mcg by mouth daily before breakfast.    Historical Provider, MD  metFORMIN (GLUCOPHAGE-XR) 500  MG 24 hr tablet Take 500 mg by mouth daily with breakfast.     Historical Provider, MD  Multiple Vitamin (MULTIVITAMIN) tablet Take 1 tablet by mouth daily.    Historical Provider, MD   BP 160/70 mmHg  Pulse 79  Temp(Src) 97.5 F (36.4 C) (Oral)  Resp 14  Ht 4\' 11"  (1.499 m)  Wt 95 lb (43.092 kg)  BMI 19.18 kg/m2  SpO2 100% Physical Exam  Constitutional: She appears well-developed and well-nourished.  HENT:  Head: Normocephalic.  Mouth/Throat:    Abrasion to left chin with mild local tenderness.  TTP over right mandible, no intraoral laceration.  No blood in ear canals.    Neck:  No C-spine tenderness  Cardiovascular: Normal rate and regular rhythm.   No murmur heard. Pulmonary/Chest: Effort normal. No respiratory distress. She exhibits no tenderness.    Abdominal: Soft. There is no tenderness. There is no rebound and no guarding.  Musculoskeletal:  Mild tenderness of the left hip, left tib-fib, left ankle. Patient is able to range all joints in the lower extremities without difficulty.  Neurological: She is alert.  Disoriented to time. 5 out of 5 strength in all four extremities  Skin: Skin is warm and dry.  Psychiatric: She has a normal mood and affect. Her behavior is normal.  Nursing note and vitals reviewed.   ED Course  Procedures (including critical care time) LACERATION REPAIR Performed by: Quintella Reichert Authorized by: Quintella Reichert Consent: Verbal consent obtained. Risks and benefits: risks, benefits and alternatives were discussed Consent given by: patient Patient identity confirmed: provided demographic data Prepped and Draped in normal sterile fashion Wound explored  Laceration Location: left chin  Laceration Length: 2cm  No Foreign Bodies seen or palpated  Anesthesia: local infiltration  Local anesthetic: lidocaine 1% without epinephrine  Anesthetic total: 2 ml  Irrigation method: syringe Amount of cleaning: standard  Skin closure: 5-0 prolene  Number of sutures: 3  Technique: interrupted  Patient tolerance: Patient tolerated the procedure well with no immediate complications.  Labs Review Labs Reviewed  COMPREHENSIVE METABOLIC PANEL - Abnormal; Notable for the following:    Potassium 3.2 (*)    Glucose, Bld 139 (*)    Calcium 8.7 (*)    Total Protein 6.4 (*)    All other components within normal limits  CBC WITH DIFFERENTIAL/PLATELET - Abnormal; Notable for the following:    RBC 3.52 (*)    Hemoglobin 10.9 (*)    HCT 32.3 (*)    Platelets 130 (*)    All other components within normal limits  URINALYSIS, ROUTINE W REFLEX MICROSCOPIC (NOT AT [facility]) - Abnormal; Notable for the following:    APPearance HAZY (*)    Leukocytes, UA TRACE (*)    All other components within normal limits  URINE  MICROSCOPIC-ADD ON - Abnormal; Notable for the following:    Squamous Epithelial / LPF FEW (*)    All other components within normal limits  CBG MONITORING, ED - Abnormal; Notable for the following:    Glucose-Capillary 129 (*)    All other components within normal limits  URINE CULTURE    Imaging Review Dg Chest 1 View  08/04/2015  CLINICAL DATA:  Fall EXAM: CHEST 1 VIEW COMPARISON:  11/18/2014 FINDINGS: Lungs are essentially clear. Mild right basilar opacity, likely atelectasis. No pleural effusion or pneumothorax. The heart is normal in size. IMPRESSION: No evidence of acute cardiopulmonary disease. Electronically Signed   By: Julian Hy M.D.   On: 08/04/2015 13:52  Dg Ankle Complete Left  08/04/2015  CLINICAL DATA:  Status post fall with left ankle pain. EXAM: LEFT ANKLE COMPLETE - 3+ VIEW COMPARISON:  None. FINDINGS: No evidence of acute fracture or subluxation. No focal bone lesion or bone destruction. Bone cortex and trabecular architecture appear intact. Plantar calcaneal spur, and os trigonum are noted. There is soft tissue swelling about the ankle. IMPRESSION: No acute fracture or dislocation identified about the left ankle. Soft tissue swelling about the ankle. Electronically Signed   By: Fidela Salisbury M.D.   On: 08/04/2015 13:59   Ct Head Wo Contrast  08/04/2015  CLINICAL DATA:  81 year old female who fell and injured chin and teeth. Alzheimer's dementia. EXAM: CT HEAD WITHOUT CONTRAST CT MAXILLOFACIAL WITHOUT CONTRAST CT CERVICAL SPINE WITHOUT CONTRAST TECHNIQUE: Multidetector CT imaging of the head, cervical spine, and maxillofacial structures were performed using the standard protocol without intravenous contrast. Multiplanar CT image reconstructions of the cervical spine and maxillofacial structures were also generated. COMPARISON:  Head CT 11/17/2014 FINDINGS: CT HEAD FINDINGS No intracranial hemorrhage. No parenchymal contusion. No midline shift or mass effect.  Basilar cisterns are patent. No skull base fracture. No fluid in the paranasal sinuses or mastoid air cells. Orbits are normal. There is generalized cortical atrophy. There is periventricular subcortical white matter hypodensities. Paranasal sinuses and  mastoid air cells are clear. CT MAXILLOFACIAL FINDINGS Orbital walls are intact. Intraconal contents are clear. Globes are not intact. Maxillary sinus walls are intact. There is thickening of sinus walls. Pterygoid plates are normal. There is no fluid the paranasal sinuses. No nasal bone fracture. Frontal sinuses are clear. No zygomatic arch fracture. There is a fracture of the condylar process of RIGHT mandible with the mandibular head displaced inferiorly and anteriorly (image 37, series 5). The ramus of the RIGHT mandible is migrated superiorly into the condylar fossa (sagittal image 13, series 9) (axial image 35, series 5 and 37 CT CERVICAL SPINE FINDINGS No prevertebral soft tissue swelling. There is joint space narrowing C3-C4 and and C5-6 with joint space widening at C4-C5. This pattern is not changed from CT of 11/17/2014. Multiple levels of endplate spurring and joint space narrowing normal facet articulation. Normal craniocervical junction. There is degenerative pannus posterior to C2 dens. No evidence epidural paraspinal hematoma. IMPRESSION: 1. No acute intracranial findings.  No change from prior. 2. Fracture of the RIGHT mandibular condylar process with displacement mandibular head anterior and inferiorly and superior migration of the RIGHT mandibular ramus into the condylar fossa. 3. 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 No change from prior CT. Findings conveyed toELIZABETH Emmilia Sowder on 08/04/2015  at13:52. Electronically Signed   By: Suzy Bouchard M.D.   On: 08/04/2015 13:53   Ct Cervical Spine Wo Contrast  08/04/2015  CLINICAL DATA:  81 year old female who fell and injured chin and teeth. Alzheimer's  dementia. EXAM: CT HEAD WITHOUT CONTRAST CT MAXILLOFACIAL WITHOUT CONTRAST CT CERVICAL SPINE WITHOUT CONTRAST TECHNIQUE: Multidetector CT imaging of the head, cervical spine, and maxillofacial structures were performed using the standard protocol without intravenous contrast. Multiplanar CT image reconstructions of the cervical spine and maxillofacial structures were also generated. COMPARISON:  Head CT 11/17/2014 FINDINGS: CT HEAD FINDINGS No intracranial hemorrhage. No parenchymal contusion. No midline shift or mass effect. Basilar cisterns are patent. No skull base fracture. No fluid in the paranasal sinuses or mastoid air cells. Orbits are normal. There is generalized cortical atrophy. There is periventricular subcortical white matter hypodensities. Paranasal sinuses and  mastoid  air cells are clear. CT MAXILLOFACIAL FINDINGS Orbital walls are intact. Intraconal contents are clear. Globes are not intact. Maxillary sinus walls are intact. There is thickening of sinus walls. Pterygoid plates are normal. There is no fluid the paranasal sinuses. No nasal bone fracture. Frontal sinuses are clear. No zygomatic arch fracture. There is a fracture of the condylar process of RIGHT mandible with the mandibular head displaced inferiorly and anteriorly (image 37, series 5). The ramus of the RIGHT mandible is migrated superiorly into the condylar fossa (sagittal image 13, series 9) (axial image 35, series 5 and 37 CT CERVICAL SPINE FINDINGS No prevertebral soft tissue swelling. There is joint space narrowing C3-C4 and and C5-6 with joint space widening at C4-C5. This pattern is not changed from CT of 11/17/2014. Multiple levels of endplate spurring and joint space narrowing normal facet articulation. Normal craniocervical junction. There is degenerative pannus posterior to C2 dens. No evidence epidural paraspinal hematoma. IMPRESSION: 1. No acute intracranial findings.  No change from prior. 2. Fracture of the RIGHT  mandibular condylar process with displacement mandibular head anterior and inferiorly and superior migration of the RIGHT mandibular ramus into the condylar fossa. 3. 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 No change from prior CT. Findings conveyed toELIZABETH Alania Overholt on 08/04/2015  at13:52. Electronically Signed   By: Suzy Bouchard M.D.   On: 08/04/2015 13:53   Dg Hand Complete Left  08/04/2015  CLINICAL DATA:  Fall, wrist pain EXAM: LEFT HAND - COMPLETE 3+ VIEW COMPARISON:  None. FINDINGS: No evidence of fracture of the carpal or metacarpal bones. Radiocarpal joint is intact. Phalanges are normal. There is mild subluxation at the first carpal/metacarpal joint with associated sclerosis and joint space narrowing. No soft tissue injury. IMPRESSION: 1. No evidence of fracture. 2. 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 Electronically Signed   By: Suzy Bouchard M.D.   On: 08/04/2015 13:59   Dg Hip Unilat With Pelvis 2-3 Views Left  08/04/2015  CLINICAL DATA:  Left hip pain status post EXAM: DG HIP (WITH OR WITHOUT PELVIS) 2-3V LEFT COMPARISON:  None. FINDINGS: There is no evidence of hip fracture or dislocation. Pelvic bones are intact. There is diffuse osteopenia. Phleboliths and vascular calcifications are noted in the pelvis. IMPRESSION: No acute fracture or dislocation identified about the left hip or pelvis. Electronically Signed   By: Fidela Salisbury M.D.   On: 08/04/2015 13:54   Ct Maxillofacial Wo Cm  08/04/2015  CLINICAL DATA:  81 year old female who fell and injured chin and teeth. Alzheimer's dementia. EXAM: CT HEAD WITHOUT CONTRAST CT MAXILLOFACIAL WITHOUT CONTRAST CT CERVICAL SPINE WITHOUT CONTRAST TECHNIQUE: Multidetector CT imaging of the head, cervical spine, and maxillofacial structures were performed using the standard protocol without intravenous contrast. Multiplanar CT image reconstructions of the cervical  spine and maxillofacial structures were also generated. COMPARISON:  Head CT 11/17/2014 FINDINGS: CT HEAD FINDINGS No intracranial hemorrhage. No parenchymal contusion. No midline shift or mass effect. Basilar cisterns are patent. No skull base fracture. No fluid in the paranasal sinuses or mastoid air cells. Orbits are normal. There is generalized cortical atrophy. There is periventricular subcortical white matter hypodensities. Paranasal sinuses and  mastoid air cells are clear. CT MAXILLOFACIAL FINDINGS Orbital walls are intact. Intraconal contents are clear. Globes are not intact. Maxillary sinus walls are intact. There is thickening of sinus walls. Pterygoid plates are normal. There is no fluid the paranasal sinuses. No nasal bone fracture. Frontal sinuses  are clear. No zygomatic arch fracture. There is a fracture of the condylar process of RIGHT mandible with the mandibular head displaced inferiorly and anteriorly (image 37, series 5). The ramus of the RIGHT mandible is migrated superiorly into the condylar fossa (sagittal image 13, series 9) (axial image 35, series 5 and 37 CT CERVICAL SPINE FINDINGS No prevertebral soft tissue swelling. There is joint space narrowing C3-C4 and and C5-6 with joint space widening at C4-C5. This pattern is not changed from CT of 11/17/2014. Multiple levels of endplate spurring and joint space narrowing normal facet articulation. Normal craniocervical junction. There is degenerative pannus posterior to C2 dens. No evidence epidural paraspinal hematoma. IMPRESSION: 1. No acute intracranial findings.  No change from prior. 2. Fracture of the RIGHT mandibular condylar process with displacement mandibular head anterior and inferiorly and superior migration of the RIGHT mandibular ramus into the condylar fossa. 3. 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 No change from prior CT. Findings conveyed toELIZABETH Melitza Metheny on 08/04/2015  at13:52.  Electronically Signed   By: Suzy Bouchard M.D.   On: 08/04/2015 13:53   I have personally reviewed and evaluated these images and lab results as part of my medical decision-making.   EKG Interpretation None      MDM   Final diagnoses:  Fall, initial encounter  Chin laceration, initial encounter  Closed fracture of condylar process of mandible, initial encounter [facility])    Patient presents for evaluation of injuries following a fall. Chin laceration repaired per notes. She has tenderness over her mandibular condyle and has a fracture noted on CT scan. Discussed the case with Dr. Constance Holster with ENT, recommends soft diet and outpatient follow-up. There is no evidence of open fracture on examination. Discussed with patient and her daughter home care for mandibular fracture as well as chin laceration as well as the importance of outpatient follow-up and return precautions. Patient is able to ambulate in the department without any difficulty.    Quintella Reichert, MD 08/04/15 1704

## 2015-08-04 NOTE — Discharge Instructions (Signed)
Laceration Care, Adult °A laceration is a cut that goes through all of the layers of the skin and into the tissue that is right under the skin. Some lacerations heal on their own. Others need to be closed with stitches (sutures), staples, skin adhesive strips, or skin glue. Proper laceration care minimizes the risk of infection and helps the laceration to heal better. °HOW TO CARE FOR YOUR LACERATION °If sutures or staples were used: °· Keep the wound clean and dry. °· If you were given a bandage (dressing), you should change it at least one time per day or as told by your health care provider. You should also change it if it becomes wet or dirty. °· Keep the wound completely dry for the first 24 hours or as told by your health care provider. After that time, you may shower or bathe. However, make sure that the wound is not soaked in water until after the sutures or staples have been removed. °· Clean the wound one time each day or as told by your health care provider: °· Wash the wound with soap and water. °· Rinse the wound with water to remove all soap. °· Pat the wound dry with a clean towel. Do not rub the wound. °· After cleaning the wound, apply a thin layer of antibiotic ointment as told by your health care provider. This will help to prevent infection and keep the dressing from sticking to the wound. °· Have the sutures or staples removed as told by your health care provider. °If skin adhesive strips were used: °· Keep the wound clean and dry. °· If you were given a bandage (dressing), you should change it at least one time per day or as told by your health care provider. You should also change it if it becomes dirty or wet. °· Do not get the skin adhesive strips wet. You may shower or bathe, but be careful to keep the wound dry. °· If the wound gets wet, pat it dry with a clean towel. Do not rub the wound. °· Skin adhesive strips fall off on their own. You may trim the strips as the wound heals. Do not  remove skin adhesive strips that are still stuck to the wound. They will fall off in time. °If skin glue was used: °· Try to keep the wound dry, but you may briefly wet it in the shower or bath. Do not soak the wound in water, such as by swimming. °· After you have showered or bathed, gently pat the wound dry with a clean towel. Do not rub the wound. °· Do not do any activities that will make you sweat heavily until the skin glue has fallen off on its own. °· Do not apply liquid, cream, or ointment medicine to the wound while the skin glue is in place. Using those may loosen the film before the wound has healed. °· If you were given a bandage (dressing), you should change it at least one time per day or as told by your health care provider. You should also change it if it becomes dirty or wet. °· If a dressing is placed over the wound, be careful not to apply tape directly over the skin glue. Doing that may cause the glue to be pulled off before the wound has healed. °· Do not pick at the glue. The skin glue usually remains in place for 5-10 days, then it falls off of the skin. °General Instructions °· Take over-the-counter and prescription   medicines only as told by your health care provider.  If you were prescribed an antibiotic medicine or ointment, take or apply it as told by your doctor. Do not stop using it even if your condition improves.  To help prevent scarring, make sure to cover your wound with sunscreen whenever you are outside after stitches are removed, after adhesive strips are removed, or when glue remains in place and the wound is healed. Make sure to wear a sunscreen of at least 30 SPF.  Do not scratch or pick at the wound.  Keep all follow-up visits as told by your health care provider. This is important.  Check your wound every day for signs of infection. Watch for:  Redness, swelling, or pain.  Fluid, blood, or pus.  Raise (elevate) the injured area above the level of your heart  while you are sitting or lying down, if possible. SEEK MEDICAL CARE IF:  You received a tetanus shot and you have swelling, severe pain, redness, or bleeding at the injection site.  You have a fever.  A wound that was closed breaks open.  You notice a bad smell coming from your wound or your dressing.  You notice something coming out of the wound, such as wood or glass.  Your pain is not controlled with medicine.  You have increased redness, swelling, or pain at the site of your wound.  You have fluid, blood, or pus coming from your wound.  You notice a change in the color of your skin near your wound.  You need to change the dressing frequently due to fluid, blood, or pus draining from the wound.  You develop a new rash.  You develop numbness around the wound. SEEK IMMEDIATE MEDICAL CARE IF:  You develop severe swelling around the wound.  Your pain suddenly increases and is severe.  You develop painful lumps near the wound or on skin that is anywhere on your body.  You have a red streak going away from your wound.  The wound is on your hand or foot and you cannot properly move a finger or toe.  The wound is on your hand or foot and you notice that your fingers or toes look pale or bluish.   This information is not intended to replace advice given to you by your health care provider. Make sure you discuss any questions you have with your health care provider.   Document Released: 09/22/2005 Document Revised: 02/06/2015 Document Reviewed: 09/18/2014 Elsevier Interactive Patient Education 2016 [employer] Mandibular Fracture A mandibular fracture is a break in the jawbone. CAUSES  The most common cause of mandibular fracture is a direct blow (trauma) to the jaw. This could happen from:  A car crash.  Physical violence.  A fall from a high place. SYMPTOMS   Pain.  Swelling.  Difficulty and pain when closing the mouth.  Feeling that the teeth are not  aligned properly when closing the mouth (malocclusion).  Difficulty speaking.  Difficulty swallowing. DIAGNOSIS  Your caregiver will take your history and perform a physical exam. He or she may also order imaging tests, such as X-rays or a computed tomography (CT) scan, to confirm your diagnosis. TREATMENT  Surgery is often needed to put the jaw back in the right position. Wires are usually placed around the teeth to hold the jaw in place while it heals. Treatment may also include pain medicine, ice, and a soft or liquid diet. HOME CARE INSTRUCTIONS   Put ice on the  injured area.  Put ice in a plastic bag.  Place a towel between your skin and the bag.  Leave the ice on for 15-20 minutes, 03-04 times a day for the first 2 days.  Only take over-the-counter or prescription medicines for pain, discomfort, or fever as directed by your caregiver.  Eat a well-balanced, high-protein soft or liquid diet as directed by your caregiver.  If your jaws are wired, follow your caregiver's instructions for wired jaw care.  Sleep on your back to avoid putting pressure on your jaw.  Avoid exercising to the point that you become short of breath. SEEK MEDICAL CARE IF:   You have a severe headache or numbness in your face.  You have severe jaw pain that is not relieved with medicine.  Your jaw wires become loose.  You have uncontrollable nausea or anxiety.  Your swelling or redness gets worse. SEEK IMMEDIATE MEDICAL CARE IF:  You have a fever.  You have difficulty breathing.  You feel like your airway is tightening.  You cannot swallow your saliva.  You make a high-pitched whistling sound when you breathe (wheezing). MAKE SURE YOU:   Understand these instructions.  Will watch your condition.  Will get help right away if you are not doing well or get worse.   This information is not intended to replace advice given to you by your health care provider. Make sure you discuss any  questions you have with your health care provider.   Document Released: 09/22/2005 Document Revised: 10/13/2014 Document Reviewed: 10/08/2011 Elsevier Interactive Patient Education 2016 [place] Meal Plan A soft-food meal plan includes foods that are safe and easy to swallow. This meal plan typically is used:  If you are having trouble chewing or swallowing foods.  As a transition meal plan after only having had liquid meals for a long period. WHAT DO I NEED TO KNOW ABOUT THE SOFT-FOOD MEAL PLAN? A soft-food meal plan includes tender foods that are soft and easy to chew and swallow. In most cases, bite-sized pieces of food are easier to swallow. A bite-sized piece is about  inch or smaller. Foods in this plan do not need to be ground or pureed. Foods that are very hard, crunchy, or sticky should be avoided. Also, breads, cereals, yogurts, and desserts with nuts, seeds, or fruits should be avoided. WHAT FOODS CAN I EAT? Grains Rice and wild rice. Moist bread, dressing, pasta, and noodles. Well-moistened dry or cooked cereals, such as farina (cooked wheat cereal), oatmeal, or grits. Biscuits, breads, muffins, pancakes, and waffles that have been well moistened. Vegetables Shredded lettuce. Cooked, tender vegetables, including potatoes without skins. Vegetable juices. Broths or creamed soups made with vegetables that are not stringy or chewy. Strained tomatoes (without seeds). Fruits Canned or well-cooked fruits. Soft (ripe), peeled fresh fruits, such as peaches, nectarines, kiwi, cantaloupe, honeydew melon, and watermelon (without seeds). Soft berries with small seeds, such as strawberries. Fruit juices (without pulp). Meats and Other Protein Sources Moist, tender, lean beef. Mutton. Lamb. Veal. Chicken. [place]. Liver. Ham. Fish without bones. Eggs. Dairy Milk, milk drinks, and cream. Plain cream cheese and cottage cheese. Plain yogurt. Sweets/Desserts Flavored gelatin  desserts. Custard. Plain ice cream, frozen yogurt, sherbet, milk shakes, and malts. Plain cakes and cookies. Plain hard candy.  Other Butter, margarine (without trans fat), and cooking oils. Mayonnaise. Cream sauces. Mild spices, salt, and sugar. Syrup, molasses, honey, and jelly. The items listed above may not be a complete list of recommended foods  or beverages. Contact your dietitian for more options. WHAT FOODS ARE NOT RECOMMENDED? Grains Dry bread, toast, crackers that have not been moistened. Coarse or dry cereals, such as bran, granola, and shredded wheat. Tough or chewy crusty breads, such as [place] bread or baguettes. Vegetables Corn. Raw vegetables except shredded lettuce. Cooked vegetables that are tough or stringy. Tough, crisp, fried potatoes and potato skins. Fruits Fresh fruits with skins or seeds or both, such as apples, pears, or grapes. Stringy, high-pulp fruits, such as papaya, pineapple, coconut, or mango. Fruit leather, fruit roll-ups, and all dried fruits. Meats and Other Protein Sources Sausages and hot dogs. Meats with gristle. Fish with bones. Nuts, seeds, and chunky peanut or other nut butters. Sweets/Desserts Cakes or cookies that are very dry or chewy.  The items listed above may not be a complete list of foods and beverages to avoid. Contact your dietitian for more information.   This information is not intended to replace advice given to you by your health care provider. Make sure you discuss any questions you have with your health care provider.   Document Released: 12/30/2007 Document Revised: 09/27/2013 Document Reviewed: 08/19/2013 Elsevier Interactive Patient Education [employer].

## 2015-08-04 NOTE — ED Notes (Addendum)
Pt states she fell this morning. Pt denies tripping over anything and denies getting dizzy, states she "just fell." Pt has laceration to chin with bleeding controlled at this time. Denies blood thinner use. Pt knocked out 2 teeth. Daughter states she has complained about left hip pain.

## 2015-08-04 NOTE — ED Notes (Signed)
Pt was dx with UTI on Oct. 7th and daughter states she is unsure that she took her medications.

## 2015-08-06 LAB — URINE CULTURE: Culture: 1000

## 2015-08-07 ENCOUNTER — Encounter (HOSPITAL_COMMUNITY): Payer: Self-pay | Admitting: Emergency Medicine

## 2015-08-07 ENCOUNTER — Inpatient Hospital Stay (HOSPITAL_COMMUNITY)
Admission: EM | Admit: 2015-08-07 | Discharge: 2015-08-10 | DRG: 377 | Disposition: A | Discharge status: Nursing Home (Includes ICF) | Payer: Medicare Other | Attending: Pulmonary Disease | Admitting: Pulmonary Disease

## 2015-08-07 ENCOUNTER — Emergency Department (HOSPITAL_COMMUNITY): Payer: Medicare Other

## 2015-08-07 DIAGNOSIS — S02609A Fracture of mandible, unspecified, initial encounter for closed fracture: Secondary | ICD-10-CM | POA: Diagnosis present

## 2015-08-07 DIAGNOSIS — E119 Type 2 diabetes mellitus without complications: Secondary | ICD-10-CM | POA: Diagnosis not present

## 2015-08-07 DIAGNOSIS — K625 Hemorrhage of anus and rectum: Secondary | ICD-10-CM | POA: Diagnosis not present

## 2015-08-07 DIAGNOSIS — G9340 Encephalopathy, unspecified: Secondary | ICD-10-CM

## 2015-08-07 DIAGNOSIS — Z85850 Personal history of malignant neoplasm of thyroid: Secondary | ICD-10-CM | POA: Diagnosis not present

## 2015-08-07 DIAGNOSIS — R109 Unspecified abdominal pain: Secondary | ICD-10-CM | POA: Diagnosis not present

## 2015-08-07 DIAGNOSIS — K922 Gastrointestinal hemorrhage, unspecified: Secondary | ICD-10-CM | POA: Diagnosis present

## 2015-08-07 DIAGNOSIS — Z66 Do not resuscitate: Secondary | ICD-10-CM | POA: Diagnosis present

## 2015-08-07 DIAGNOSIS — Y92014 Private driveway to single-family (private) house as the place of occurrence of the external cause: Secondary | ICD-10-CM | POA: Diagnosis not present

## 2015-08-07 DIAGNOSIS — Z809 Family history of malignant neoplasm, unspecified: Secondary | ICD-10-CM | POA: Diagnosis not present

## 2015-08-07 DIAGNOSIS — F0390 Unspecified dementia without behavioral disturbance: Secondary | ICD-10-CM | POA: Diagnosis not present

## 2015-08-07 DIAGNOSIS — K921 Melena: Secondary | ICD-10-CM | POA: Diagnosis present

## 2015-08-07 DIAGNOSIS — K219 Gastro-esophageal reflux disease without esophagitis: Secondary | ICD-10-CM | POA: Diagnosis present

## 2015-08-07 DIAGNOSIS — Z9181 History of falling: Secondary | ICD-10-CM | POA: Diagnosis not present

## 2015-08-07 DIAGNOSIS — R410 Disorientation, unspecified: Secondary | ICD-10-CM

## 2015-08-07 DIAGNOSIS — F0280 Dementia in other diseases classified elsewhere without behavioral disturbance: Secondary | ICD-10-CM | POA: Diagnosis present

## 2015-08-07 DIAGNOSIS — D509 Iron deficiency anemia, unspecified: Secondary | ICD-10-CM | POA: Diagnosis present

## 2015-08-07 DIAGNOSIS — W1839XA Other fall on same level, initial encounter: Secondary | ICD-10-CM | POA: Diagnosis not present

## 2015-08-07 DIAGNOSIS — S0181XA Laceration without foreign body of other part of head, initial encounter: Secondary | ICD-10-CM | POA: Diagnosis present

## 2015-08-07 DIAGNOSIS — E039 Hypothyroidism, unspecified: Secondary | ICD-10-CM | POA: Diagnosis present

## 2015-08-07 DIAGNOSIS — G300 Alzheimer's disease with early onset: Secondary | ICD-10-CM | POA: Diagnosis present

## 2015-08-07 DIAGNOSIS — Z833 Family history of diabetes mellitus: Secondary | ICD-10-CM | POA: Diagnosis not present

## 2015-08-07 DIAGNOSIS — B961 Klebsiella pneumoniae [K. pneumoniae] as the cause of diseases classified elsewhere: Secondary | ICD-10-CM | POA: Diagnosis present

## 2015-08-07 DIAGNOSIS — R195 Other fecal abnormalities: Secondary | ICD-10-CM | POA: Diagnosis not present

## 2015-08-07 DIAGNOSIS — R55 Syncope and collapse: Secondary | ICD-10-CM | POA: Diagnosis not present

## 2015-08-07 DIAGNOSIS — S02609D Fracture of mandible, unspecified, subsequent encounter for fracture with routine healing: Secondary | ICD-10-CM

## 2015-08-07 DIAGNOSIS — N390 Urinary tract infection, site not specified: Secondary | ICD-10-CM | POA: Diagnosis present

## 2015-08-07 DIAGNOSIS — S02611A Fracture of condylar process of right mandible, initial encounter for closed fracture: Secondary | ICD-10-CM | POA: Diagnosis present

## 2015-08-07 DIAGNOSIS — F329 Major depressive disorder, single episode, unspecified: Secondary | ICD-10-CM | POA: Diagnosis present

## 2015-08-07 DIAGNOSIS — R296 Repeated falls: Secondary | ICD-10-CM | POA: Diagnosis present

## 2015-08-07 LAB — URINALYSIS, ROUTINE W REFLEX MICROSCOPIC
Bilirubin Urine: NEGATIVE
Glucose, UA: NEGATIVE mg/dL
KETONES UR: NEGATIVE mg/dL
NITRITE: NEGATIVE
PH: 6.5 (ref 5.0–8.0)
Protein, ur: NEGATIVE mg/dL
SPECIFIC GRAVITY, URINE: 1.02 (ref 1.005–1.030)
UROBILINOGEN UA: 0.2 mg/dL (ref 0.0–1.0)

## 2015-08-07 LAB — BASIC METABOLIC PANEL
ANION GAP: 11 (ref 5–15)
BUN: 8 mg/dL (ref 6–20)
CO2: 25 mmol/L (ref 22–32)
Calcium: 8.3 mg/dL — ABNORMAL LOW (ref 8.9–10.3)
Chloride: 103 mmol/L (ref 101–111)
Creatinine, Ser: 0.63 mg/dL (ref 0.44–1.00)
GFR calc Af Amer: >60 mL/min (ref >60)
GLUCOSE: 189 mg/dL — AB (ref 65–99)
POTASSIUM: 3.2 mmol/L — AB (ref 3.5–5.1)
Sodium: 139 mmol/L (ref 135–145)

## 2015-08-07 LAB — CBC WITH DIFFERENTIAL/PLATELET
BASOS ABS: 0 10*3/uL (ref 0.0–0.1)
Basophils Relative: 0 %
Eosinophils Absolute: 0 10*3/uL (ref 0.0–0.7)
Eosinophils Relative: 1 %
HEMATOCRIT: 30.1 % — AB (ref 36.0–46.0)
Hemoglobin: 10.3 g/dL — ABNORMAL LOW (ref 12.0–15.0)
LYMPHS PCT: 9 %
Lymphs Abs: 0.6 10*3/uL — ABNORMAL LOW (ref 0.7–4.0)
MCH: 30.9 pg (ref 26.0–34.0)
MCHC: 34.2 g/dL (ref 30.0–36.0)
MCV: 90.4 fL (ref 78.0–100.0)
Monocytes Absolute: 0.5 10*3/uL (ref 0.1–1.0)
Monocytes Relative: 7 %
NEUTROS ABS: 5.4 10*3/uL (ref 1.7–7.7)
NEUTROS PCT: 83 %
Platelets: 127 10*3/uL — ABNORMAL LOW (ref 150–400)
RBC: 3.33 MIL/uL — AB (ref 3.87–5.11)
RDW: 13.8 % (ref 11.5–15.5)
WBC: 6.6 10*3/uL (ref 4.0–10.5)

## 2015-08-07 LAB — POC OCCULT BLOOD, ED: Fecal Occult Bld: POSITIVE — AB

## 2015-08-07 LAB — CBC
HEMATOCRIT: 30.4 % — AB (ref 36.0–46.0)
HEMOGLOBIN: 10.5 g/dL — AB (ref 12.0–15.0)
MCH: 30.9 pg (ref 26.0–34.0)
MCHC: 34.5 g/dL (ref 30.0–36.0)
MCV: 89.4 fL (ref 78.0–100.0)
Platelets: 140 10*3/uL — ABNORMAL LOW (ref 150–400)
RBC: 3.4 MIL/uL — ABNORMAL LOW (ref 3.87–5.11)
RDW: 13.6 % (ref 11.5–15.5)
WBC: 6.3 10*3/uL (ref 4.0–10.5)

## 2015-08-07 LAB — LACTIC ACID, PLASMA
LACTIC ACID, VENOUS: 3 mmol/L — AB (ref 0.5–2.0)
LACTIC ACID, VENOUS: 2.2 mmol/L — AB (ref 0.5–2.0)

## 2015-08-07 LAB — PROTIME-INR
INR: 1.18 (ref 0.00–1.49)
Prothrombin Time: 15.2 seconds (ref 11.6–15.2)

## 2015-08-07 LAB — PREALBUMIN: PREALBUMIN: 17.9 mg/dL — AB (ref 18–38)

## 2015-08-07 LAB — ACETAMINOPHEN LEVEL: Acetaminophen (Tylenol), Serum: <10 ug/mL — ABNORMAL LOW (ref 10–30)

## 2015-08-07 LAB — TSH: TSH: 11.367 u[IU]/mL — ABNORMAL HIGH (ref 0.350–4.500)

## 2015-08-07 LAB — URINE MICROSCOPIC-ADD ON

## 2015-08-07 LAB — TROPONIN I

## 2015-08-07 LAB — BRAIN NATRIURETIC PEPTIDE: B Natriuretic Peptide: 55 pg/mL (ref 0.0–100.0)

## 2015-08-07 MED ORDER — ACETAMINOPHEN 650 MG RE SUPP: 650.0000 mg | Freq: Four times a day (QID) | RECTAL | Status: DC | PRN

## 2015-08-07 MED ORDER — IOHEXOL 300 MG/ML  SOLN
75.0000 mL | Freq: Once | INTRAMUSCULAR | Status: AC | PRN
  Administered 2015-08-07: 75 mL via INTRAVENOUS

## 2015-08-07 MED ORDER — ONDANSETRON HCL 4 MG PO TABS: 4.0000 mg | ORAL_TABLET | Freq: Four times a day (QID) | ORAL | Status: DC | PRN

## 2015-08-07 MED ORDER — LEVOTHYROXINE SODIUM 100 MCG PO TABS
100.0000 ug | ORAL_TABLET | Freq: Every day | ORAL | Status: DC
  Administered 2015-08-08 – 2015-08-10 (×3): 100 ug via ORAL
  Filled 2015-08-07 (×3): qty 1

## 2015-08-07 MED ORDER — SODIUM CHLORIDE 0.9 % IV BOLUS (SEPSIS)
1000.0000 mL | Freq: Once | INTRAVENOUS | Status: AC
  Administered 2015-08-07: 1000 mL via INTRAVENOUS

## 2015-08-07 MED ORDER — ONDANSETRON HCL 4 MG/2ML IJ SOLN: 4.0000 mg | Freq: Four times a day (QID) | INTRAMUSCULAR | Status: DC | PRN

## 2015-08-07 MED ORDER — PANTOPRAZOLE SODIUM 40 MG IV SOLR
40.0000 mg | Freq: Two times a day (BID) | INTRAVENOUS | Status: DC
  Administered 2015-08-07 – 2015-08-10 (×6): 40 mg via INTRAVENOUS
  Filled 2015-08-07 (×6): qty 40

## 2015-08-07 MED ORDER — FERROUS SULFATE 325 (65 FE) MG PO TABS
325.0000 mg | ORAL_TABLET | Freq: Every day | ORAL | Status: DC
  Administered 2015-08-08 – 2015-08-10 (×3): 325 mg via ORAL
  Filled 2015-08-07 (×4): qty 1

## 2015-08-07 MED ORDER — SODIUM CHLORIDE 0.9 % IV SOLN
INTRAVENOUS | Status: DC
  Administered 2015-08-08 – 2015-08-09 (×4): via INTRAVENOUS

## 2015-08-07 MED ORDER — ACETAMINOPHEN 325 MG PO TABS: 650.0000 mg | ORAL_TABLET | Freq: Four times a day (QID) | ORAL | Status: DC | PRN

## 2015-08-07 MED ORDER — SODIUM CHLORIDE 0.9 % IJ SOLN
3.0000 mL | Freq: Two times a day (BID) | INTRAMUSCULAR | Status: DC
  Administered 2015-08-08 – 2015-08-10 (×4): 3 mL via INTRAVENOUS

## 2015-08-07 NOTE — ED Notes (Signed)
Pt denies complaints. Family states that pt keeps falling and lives alone.  Went to pcp this morning for placement and was sent here for evaluation.  Family states that pt is not able to live alone any longer.  Pt is confused at baseline and has been getting more confused at night for several weeks now.

## 2015-08-07 NOTE — ED Notes (Signed)
Pt had a large amt of grossly bloody stool.  MD made aware.

## 2015-08-07 NOTE — ED Notes (Signed)
Per pt family, pt reports abdominal pain,increased confusion at night,back pain,nausea. Pt seen for fall on Saturday. Pt sent here today by Dr. Luan Pulling.

## 2015-08-07 NOTE — ED Provider Notes (Signed)
CSN: [id number]     Arrival date & time 08/07/15  1117 History  By signing my name below, I, Terressa Koyanagi, attest that this documentation has been prepared under the direction and in the presence of Lavina Hamman, MD. Electronically Signed: Terressa Koyanagi, ED Scribe. 08/07/2015. 12:38 PM.   LEVEL 5 CAVEAT: ALZHEIMER'S   Chief Complaint  Patient presents with  . Abdominal Pain   The history is provided by the patient and a relative. No language interpreter was used.   PCP: [name], MD HPI Comments: Kara Santos is a 81 y.o. female, accompanied by her daughter and niece, with PMHx noted below including early onset alzheimer's, DM, anemia, and depression, who presents to the Emergency Department, at her PCP's direction for further evaluation, complaining of abd pain with associated back pain and nausea onset this morning. Daughter reports pt lives alone and her condition is deteriorating resulting in frequent falls, worsening confusion, decreased intake PO, increased weakness, and increased depression (crying frequently). Daughter specifies that pt's confusion has been markedly worse since pt's fall last week. Pt denies cough. Daughter denies any medicinal allergies.  Past Medical History  Diagnosis Date  . Anemia 11/07/2011    chronic IDA requiring multiple transfusions  . Diabetes mellitus   . Hypothyroidism     s/p thyroid surgery for thyroid cancer  . GERD (gastroesophageal reflux disease)   . Iron deficiency anemia 05/05/2012  . Depression   . Closed fracture of greater trochanter of femur ([place]) 02/2013    left  . AVM (arteriovenous malformation) of stomach, acquired with hemorrhage 12/04/2013  . Alzheimer's disease, early onset 34  . Fall at home 11/17/14    multiple rib fractures   Past Surgical History  Procedure Laterality Date  . Esophagogastroduodenoscopy  08/2006    Dr. Trevor Iha [facility], large duodenal diverticulum  . Colonoscopy  08/2006    Dr. Cecelia Byars sided  diverticulosis  . Givens small bowel capsule  08/2006    few gastric erosions, numerous small bowel AVMs with SB erosions  . Double balloon enteroscopy  06/1008    [facility]-->APC of 12 SB AVMs in jejunum. 6 feet of jejunum inspected  . Right shoulder surgery    . Cholecystectomy    . Appendectomy    . Bilateral cataract extractions    . Breast reduction surgery    . Thyroid surgery      thyroid cancer   Family History  Problem Relation Age of Onset  . Cancer Mother   . Cancer Father   . Diabetes Father   . Diabetes Sister   . Cancer Brother    Social History  Substance Use Topics  . Smoking status: Never Smoker   . Smokeless tobacco: Never Used  . Alcohol Use: No   OB History    No data available     Review of Systems  Unable to perform ROS: Dementia   Allergies  Asa  Home Medications   Prior to Admission medications   Medication Sig Start Date End Date Taking? Authorizing Provider  acetaminophen (TYLENOL) 325 MG tablet Take 650 mg by mouth every 4 (four) hours as needed for pain.   Yes Historical Provider, MD  cyanocobalamin 100 MCG tablet Take 100 mcg by mouth daily.   Yes Historical Provider, MD  ferrous sulfate 325 (65 FE) MG tablet Take 325 mg by mouth daily with breakfast.   Yes Historical Provider, MD  lansoprazole (PREVACID) 30 MG capsule Take 30 mg by mouth daily at  12 noon.   Yes Historical Provider, MD  levothyroxine (SYNTHROID, LEVOTHROID) 100 MCG tablet Take 100 mcg by mouth daily before breakfast.   Yes Historical Provider, MD  metFORMIN (GLUCOPHAGE-XR) 500 MG 24 hr tablet Take 500 mg by mouth daily with breakfast.    Yes Historical Provider, MD  Multiple Vitamin (MULTIVITAMIN) tablet Take 1 tablet by mouth daily.   Yes Historical Provider, MD   Triage Vitals: BP 182/64 mmHg  Pulse 78  Temp(Src) 97.4 F (36.3 C) (Temporal)  Resp 18  Ht 4\' 11"  (1.499 m)  Wt 95 lb (43.092 kg)  BMI 19.18 kg/m2  SpO2 100% Physical Exam  Constitutional: She is oriented  to person, place, and time. She appears well-developed and well-nourished.  HENT:  Head: Normocephalic.  Eyes: EOM are normal.  Neck: Normal range of motion.  Bruising on left mandibular and right side of face, well healing wound to chin.   Pulmonary/Chest: Effort normal.  Abdominal: She exhibits no distension and no mass. There is tenderness. There is no rebound, no guarding and no CVA tenderness.  Musculoskeletal: Normal range of motion.  Neurological: She is alert and oriented to person, place, and time.  Psychiatric: She has a normal mood and affect.  Nursing note and vitals reviewed.  ED Course  Procedures (including critical care time) DIAGNOSTIC STUDIES: Oxygen Saturation is 100% on RA, nl by my interpretation.    COORDINATION OF CARE: 12:34 PM: Discussed treatment plan which includes imaging and labs with pt's family. Family verbalizes understanding and agrees with treatment plan.  Labs Review Labs Reviewed  CBC WITH DIFFERENTIAL/PLATELET - Abnormal; Notable for the following:    RBC 3.33 (*)    Hemoglobin 10.3 (*)    HCT 30.1 (*)    Platelets 127 (*)    Lymphs Abs 0.6 (*)    All other components within normal limits  BASIC METABOLIC PANEL - Abnormal; Notable for the following:    Potassium 3.2 (*)    Glucose, Bld 189 (*)    Calcium 8.3 (*)    All other components within normal limits  URINALYSIS, ROUTINE W REFLEX MICROSCOPIC (NOT AT [facility]) - Abnormal; Notable for the following:    Hgb urine dipstick TRACE (*)    Leukocytes, UA SMALL (*)    All other components within normal limits  ACETAMINOPHEN LEVEL - Abnormal; Notable for the following:    Acetaminophen (Tylenol), Serum <10 (*)    All other components within normal limits  LACTIC ACID, PLASMA - Abnormal; Notable for the following:    Lactic Acid, Venous 3.0 (*)    All other components within normal limits  LACTIC ACID, PLASMA - Abnormal; Notable for the following:    Lactic Acid, Venous 2.2 (*)    All other  components within normal limits  PREALBUMIN - Abnormal; Notable for the following:    Prealbumin 17.9 (*)    All other components within normal limits  CBC - Abnormal; Notable for the following:    RBC 3.40 (*)    Hemoglobin 10.5 (*)    HCT 30.4 (*)    Platelets 140 (*)    All other components within normal limits  TSH - Abnormal; Notable for the following:    TSH 11.367 (*)    All other components within normal limits  POC OCCULT BLOOD, ED - Abnormal; Notable for the following:    Fecal Occult Bld POSITIVE (*)    All other components within normal limits  URINE CULTURE  C DIFFICILE QUICK  SCREEN W PCR REFLEX  TROPONIN I  BRAIN NATRIURETIC PEPTIDE  URINE MICROSCOPIC-ADD ON  PROTIME-INR  CBC  COMPREHENSIVE METABOLIC PANEL  T4, FREE  CBC    Imaging Review Ct Head Wo Contrast  08/07/2015  CLINICAL DATA:  Increased confusion at night.  Dementia. EXAM: CT HEAD WITHOUT CONTRAST TECHNIQUE: Contiguous axial images were obtained from the base of the skull through the vertex without intravenous contrast. COMPARISON:  08/04/2015 FINDINGS: There is atrophy and chronic small vessel disease changes. No acute intracranial abnormality. Specifically, no hemorrhage, hydrocephalus, mass lesion, acute infarction, or significant intracranial injury. No acute calvarial abnormality. Again noted is the fracture the right mandibular condyle, stable. IMPRESSION: No acute intracranial abnormality. Atrophy, chronic microvascular disease. Electronically Signed   By: Rolm Baptise M.D.   On: 08/07/2015 15:29   Ct Abdomen Pelvis W Contrast  08/07/2015  CLINICAL DATA:  Abdominal pain, blood in stool, increased confusion at night, back pain, nausea, personal history of dementia, at diabetes mellitus, thyroid cancer EXAM: CT ABDOMEN AND PELVIS WITH CONTRAST TECHNIQUE: Multidetector CT imaging of the abdomen and pelvis was performed using the standard protocol following bolus administration of intravenous contrast.  Sagittal and coronal MPR images reconstructed from axial data set. CONTRAST:  13mL OMNIPAQUE IOHEXOL 300 MG/ML SOLN IV. Dilute oral contrast. COMPARISON:  11/18/2014 FINDINGS: Minimal bibasilar atelectasis. Mitral annular calcification. Scattered atherosclerotic calcifications. Retro aortic LEFT renal vein. Post cholecystectomy and appendectomy by history. Tiny RIGHT renal cyst. Minimally nodular medial limb RIGHT adrenal gland, stable. Liver, spleen, atrophic pancreas, kidneys, and adrenal glands otherwise normal. Unremarkable bladder, ureters, uterus and adnexa. Wall thickening of rectum and distal sigmoid colon questioned. Stomach and bowel loops otherwise normal appearance. Tiny umbilical hernia containing fat. No mass, adenopathy, free air or free fluid. Numerous pelvic phleboliths. Multifactorial acquired spinal stenosis L4-L5 without acute osseous findings. IMPRESSION: Questionable wall thickening of the rectum and distal sigmoid colon, though this could be potentially related to artifact from underdistention. Endoscopy recommended to exclude distal colitis/proctitis. Multifactorial spinal stenosis L4-L5. Otherwise negative exam. Electronically Signed   By: Lavonia Dana M.D.   On: 08/07/2015 15:36   Dg Chest Port 1 View  08/07/2015  CLINICAL DATA:  Confusion. EXAM: PORTABLE CHEST 1 VIEW COMPARISON:  08/04/2015 FINDINGS: The heart size and mediastinal contours are within normal limits. Stable mild elevation of the right hemidiaphragm. There is no evidence of pulmonary edema, consolidation, pneumothorax, nodule or pleural fluid. The visualized skeletal structures are unremarkable. IMPRESSION: No active disease. Electronically Signed   By: Aletta Edouard M.D.   On: 08/07/2015 13:32   I have personally reviewed and evaluated these images and lab results as part of my medical decision-making.   EKG Interpretation None      MDM   Final diagnoses:  Confusion   Increasing weakness and difficulty  taking care of herself. Had a large bloody bowel movement while in the emergency department that was Hemoccult positive. This likely the cause of her abdominal pain. CT scan done shows some evidence of colitis so GI consult for evaluation in the morning, will admit to telemetry bed with hospitalist service for further monitoring.     I personally performed the services described in this documentation, which was scribed in my presence. The recorded information has been reviewed and is accurate.    Merrily Pew, MD 08/07/15 [phone]

## 2015-08-07 NOTE — ED Notes (Signed)
CRITICAL VALUE ALERT  Critical value received:  Lactic acid  Date of notification 08/07/15  Time of notification:  1334  Critical value read back: yes  Nurse who received alert:  Billey Gosling  MD notified (1st page): [place]ner  Time of first page: 1345  MD notified (2nd page):  Time of second page:  Responding MD:  [place] Time MD responded:  [phone]

## 2015-08-07 NOTE — H&P (Signed)
[facility] History and Physical  Patient: Kara Santos  MRN: [id number]  DOB: 07-29-1917  DOS: the patient was seen and examined on 08/07/2015 PCP: [name], MD  Referring physician: Dr Dolly Rias  Chief Complaint: Confusion and weakness  HPI: Kara Santos is a 81 y.o. female with Past medical history of AV malformation of the stomach, chronic GI bleeding, chronic iron deficiency anemia, diabetes mellitus, hypothyroidism, dementia, recurrent fall. The patient is presenting with complaints of confusion which has been ongoing for last few weeks progressively worsening. There is also complained of generalized weakness and fatigue. Also there is a complaint of unsteady on gait which is been present yesterday. At the time of my evaluation the patient did not have any complaints of headache, dizziness, chest pain, abdominal pain, nausea, burning urination. Family did not report any complaints of diarrhea with the patient is having diarrhea here in the ER. In the beginning of October the patient was on antibiotic for UTI. No other recent change in patient's medications reported. Patient was recently seen in the ER for a fall and was found to be having mandibular fracture which is currently being managed conservatively.  The patient is coming from home.  At her baseline ambulates with walker And is dependent for most of her ADL;  does not manages her medication on her own.  Review of Systems: as mentioned in the history of present illness.  A comprehensive review of the other systems is negative.  Past Medical History  Diagnosis Date  . Anemia 11/07/2011    chronic IDA requiring multiple transfusions  . Diabetes mellitus   . Hypothyroidism     s/p thyroid surgery for thyroid cancer  . GERD (gastroesophageal reflux disease)   . Iron deficiency anemia 05/05/2012  . Depression   . Closed fracture of greater trochanter of femur ([place]) 02/2013    left  . AVM (arteriovenous  malformation) of stomach, acquired with hemorrhage 12/04/2013  . Alzheimer's disease, early onset 75  . Fall at home 11/17/14    multiple rib fractures   Past Surgical History  Procedure Laterality Date  . Esophagogastroduodenoscopy  08/2006    Dr. Trevor Iha [facility], large duodenal diverticulum  . Colonoscopy  08/2006    Dr. Cecelia Byars sided diverticulosis  . Givens small bowel capsule  08/2006    few gastric erosions, numerous small bowel AVMs with SB erosions  . Double balloon enteroscopy  06/1008    [facility]-->APC of 12 SB AVMs in jejunum. 6 feet of jejunum inspected  . Right shoulder surgery    . Cholecystectomy    . Appendectomy    . Bilateral cataract extractions    . Breast reduction surgery    . Thyroid surgery      thyroid cancer   Social History:  reports that she has never smoked. She has never used smokeless tobacco. She reports that she does not drink alcohol or use illicit drugs.  Allergies  Allergen Reactions  . Asa [Aspirin]     Acid reflux     Family History  Problem Relation Age of Onset  . Cancer Mother   . Cancer Father   . Diabetes Father   . Diabetes Sister   . Cancer Brother     Prior to Admission medications   Medication Sig Start Date End Date Taking? Authorizing Provider  acetaminophen (TYLENOL) 325 MG tablet Take 650 mg by mouth every 4 (four) hours as needed for pain.   Yes Historical Provider, MD  cyanocobalamin 100  MCG tablet Take 100 mcg by mouth daily.   Yes Historical Provider, MD  ferrous sulfate 325 (65 FE) MG tablet Take 325 mg by mouth daily with breakfast.   Yes Historical Provider, MD  lansoprazole (PREVACID) 30 MG capsule Take 30 mg by mouth daily at 12 noon.   Yes Historical Provider, MD  levothyroxine (SYNTHROID, LEVOTHROID) 100 MCG tablet Take 100 mcg by mouth daily before breakfast.   Yes Historical Provider, MD  metFORMIN (GLUCOPHAGE-XR) 500 MG 24 hr tablet Take 500 mg by mouth daily with breakfast.    Yes Historical Provider,  MD  Multiple Vitamin (MULTIVITAMIN) tablet Take 1 tablet by mouth daily.   Yes Historical Provider, MD    Physical Exam: Filed Vitals:   08/07/15 1330 08/07/15 1400 08/07/15 1530 08/07/15 1600  BP: 163/87 173/87 137/78 160/70  Pulse: 85 78 82 76  Temp:      TempSrc:      Resp: 17 15 16 15   Height:      Weight:      SpO2: 100% 100% 100% 100%    General: Alert, Awake and Oriented to Place and Person. Appear in mild distress Eyes: PERRL ENT: Oral Mucosa clear moist. Neck: no JVD Cardiovascular: S1 and S2 Present, aortic systolic Murmur, Peripheral Pulses Present Respiratory: Bilateral Air entry equal and Decreased,  Clear to Auscultation, no Crackles, no wheezes Abdomen: Bowel Sound present, Soft and Suprapubic mild tenderness Skin: no Rash Extremities: no Pedal edema, no calf tenderness Neurologic: Mental status AAOx2, speech normal, attention repeats same conversation again  Cranial Nerves PERRL, EOM normal and present,  Motor strength bilateral equal strength 5/5,  Sensation present to light touch,  Reflexes present knee, babinski  negative ,  Cerebellar test normal finger nose finger.  Labs on Admission:  CBC:  Recent Labs Lab 08/04/15 1307 08/07/15 1230  WBC 6.0 6.6  NEUTROABS 4.8 5.4  HGB 10.9* 10.3*  HCT 32.3* 30.1*  MCV 91.8 90.4  PLT 130* 127*    CMP     Component Value Date/Time   NA 139 08/07/2015 1230   K 3.2* 08/07/2015 1230   CL 103 08/07/2015 1230   CO2 25 08/07/2015 1230   GLUCOSE 189* 08/07/2015 1230   BUN 8 08/07/2015 1230   CREATININE 0.63 08/07/2015 1230   CALCIUM 8.3* 08/07/2015 1230   PROT 6.4* 08/04/2015 1307   ALBUMIN 4.2 08/04/2015 1307   AST 30 08/04/2015 1307   ALT 21 08/04/2015 1307   ALKPHOS 55 08/04/2015 1307   BILITOT 0.7 08/04/2015 1307   GFRNONAA >60 08/07/2015 1230   GFRAA >60 08/07/2015 1230     Recent Labs Lab 08/07/15 1230  TROPONINI <0.03   BNP (last 3 results)  Recent Labs  08/07/15 1238  BNP 55.0     ProBNP (last 3 results) No results for input(s): PROBNP in the last 8760 hours.   Radiological Exams on Admission: Ct Head Wo Contrast  08/07/2015  CLINICAL DATA:  Increased confusion at night.  Dementia. EXAM: CT HEAD WITHOUT CONTRAST TECHNIQUE: Contiguous axial images were obtained from the base of the skull through the vertex without intravenous contrast. COMPARISON:  08/04/2015 FINDINGS: There is atrophy and chronic small vessel disease changes. No acute intracranial abnormality. Specifically, no hemorrhage, hydrocephalus, mass lesion, acute infarction, or significant intracranial injury. No acute calvarial abnormality. Again noted is the fracture the right mandibular condyle, stable. IMPRESSION: No acute intracranial abnormality. Atrophy, chronic microvascular disease. Electronically Signed   By: Rolm Baptise M.D.  On: 08/07/2015 15:29   Ct Abdomen Pelvis W Contrast  08/07/2015  CLINICAL DATA:  Abdominal pain, blood in stool, increased confusion at night, back pain, nausea, personal history of dementia, at diabetes mellitus, thyroid cancer EXAM: CT ABDOMEN AND PELVIS WITH CONTRAST TECHNIQUE: Multidetector CT imaging of the abdomen and pelvis was performed using the standard protocol following bolus administration of intravenous contrast. Sagittal and coronal MPR images reconstructed from axial data set. CONTRAST:  46mL OMNIPAQUE IOHEXOL 300 MG/ML SOLN IV. Dilute oral contrast. COMPARISON:  11/18/2014 FINDINGS: Minimal bibasilar atelectasis. Mitral annular calcification. Scattered atherosclerotic calcifications. Retro aortic LEFT renal vein. Post cholecystectomy and appendectomy by history. Tiny RIGHT renal cyst. Minimally nodular medial limb RIGHT adrenal gland, stable. Liver, spleen, atrophic pancreas, kidneys, and adrenal glands otherwise normal. Unremarkable bladder, ureters, uterus and adnexa. Wall thickening of rectum and distal sigmoid colon questioned. Stomach and bowel loops otherwise  normal appearance. Tiny umbilical hernia containing fat. No mass, adenopathy, free air or free fluid. Numerous pelvic phleboliths. Multifactorial acquired spinal stenosis L4-L5 without acute osseous findings. IMPRESSION: Questionable wall thickening of the rectum and distal sigmoid colon, though this could be potentially related to artifact from underdistention. Endoscopy recommended to exclude distal colitis/proctitis. Multifactorial spinal stenosis L4-L5. Otherwise negative exam. Electronically Signed   By: Lavonia Dana M.D.   On: 08/07/2015 15:36   Dg Chest Port 1 View  08/07/2015  CLINICAL DATA:  Confusion. EXAM: PORTABLE CHEST 1 VIEW COMPARISON:  08/04/2015 FINDINGS: The heart size and mediastinal contours are within normal limits. Stable mild elevation of the right hemidiaphragm. There is no evidence of pulmonary edema, consolidation, pneumothorax, nodule or pleural fluid. The visualized skeletal structures are unremarkable. IMPRESSION: No active disease. Electronically Signed   By: Aletta Edouard M.D.   On: 08/07/2015 13:32   Assessment/Plan 1. GI bleed  2 Possible colitis. 3 Chronic gastrointestinal bleeding due to AV malformation   the patient is presenting with complaints of generalized fatigue as well as confusion. Here she is found to be having multiple episodes of diarrhea with maroon color blood. Patient's hemoglobin and hemodynamic currently are stable. Patient has history of chronic hematoma formation causing chronic GI bleed. With this the patient will be admitted in telemetry unit. She'll be placed on clear liquid diet. Requested ED physician to consult with gastroenterology. We'll put her on Protonix every 12 hours. CT of the abdomen is revealing rectal as well as sigmoid colon thickening which could reflect colitis. Will check C. difficile since she was recently on antibiotic.  4 iron deficiency anemia. H&H currently stable. Monitor every 6 hours.  5  Diabetes mellitus  type 2 in nonobese (HCC)  holding metformin and placing the patient on sliding scale insulin.  6  Hypothyroidism  continuing Synthroid.  7  History of fall 8  Mandibular fracture, closed ([place]) no acute intracranial abnormality or fractures or bleeding on CT scan. Neurological examination is unrevealing.  PTOT consultation, fall precautions. Conservatively managed medically with a fracture.  9  Acute encephalopathy Most likely in the setting of colitis with bleeding. CT of the head is unremarkable, neurological examination is unremarkable., Also possibility of progression of dementia cannot be ruled out with delirium. Serial neuro checks. Monitor on telemetry.  CVAs less likely  Nutrition: clear liquid diet  DVT Prophylaxis: subcutaneous Heparin  Advance goals of care discussion: DNR/DNI as per my discussion with patient, patient's daughter [name]  Consults: Gastroenterology   Family Communication: family was present at bedside, opportunity was given to ask question  and all questions were answered satisfactorily at the time of interview. Disposition: Admitted as inpatient, telemetry unit.  Author: Berle Mull, MD [facility] Pager: [phone] 08/07/2015  If 7PM-7AM, please contact night-coverage [email or web address] Password [email or web address]

## 2015-08-08 ENCOUNTER — Inpatient Hospital Stay (HOSPITAL_COMMUNITY): Payer: Medicare Other

## 2015-08-08 DIAGNOSIS — R410 Disorientation, unspecified: Secondary | ICD-10-CM

## 2015-08-08 LAB — CBC
HCT: 29.6 % — ABNORMAL LOW (ref 36.0–46.0)
HCT: 29.7 % — ABNORMAL LOW (ref 36.0–46.0)
HCT: 30.3 % — ABNORMAL LOW (ref 36.0–46.0)
HEMATOCRIT: 27.3 % — AB (ref 36.0–46.0)
HEMOGLOBIN: 10.1 g/dL — AB (ref 12.0–15.0)
HEMOGLOBIN: 10.2 g/dL — AB (ref 12.0–15.0)
HEMOGLOBIN: 10.3 g/dL — AB (ref 12.0–15.0)
Hemoglobin: 9.5 g/dL — ABNORMAL LOW (ref 12.0–15.0)
MCH: 30.6 pg (ref 26.0–34.0)
MCH: 30.8 pg (ref 26.0–34.0)
MCH: 30.7 pg (ref 26.0–34.0)
MCH: 31.1 pg (ref 26.0–34.0)
MCHC: 34.1 g/dL (ref 30.0–36.0)
MCHC: 34.3 g/dL (ref 30.0–36.0)
MCHC: 34 g/dL (ref 30.0–36.0)
MCHC: 34.8 g/dL (ref 30.0–36.0)
MCV: 89.7 fL (ref 78.0–100.0)
MCV: 89.7 fL (ref 78.0–100.0)
MCV: 90.2 fL (ref 78.0–100.0)
MCV: 89.5 fL (ref 78.0–100.0)
PLATELETS: 139 10*3/uL — AB (ref 150–400)
PLATELETS: 146 10*3/uL — AB (ref 150–400)
Platelets: 143 10*3/uL — ABNORMAL LOW (ref 150–400)
Platelets: 141 10*3/uL — ABNORMAL LOW (ref 150–400)
RBC: 3.3 MIL/uL — ABNORMAL LOW (ref 3.87–5.11)
RBC: 3.31 MIL/uL — AB (ref 3.87–5.11)
RBC: 3.36 MIL/uL — AB (ref 3.87–5.11)
RBC: 3.05 MIL/uL — AB (ref 3.87–5.11)
RDW: 13.8 % (ref 11.5–15.5)
RDW: 13.8 % (ref 11.5–15.5)
RDW: 13.9 % (ref 11.5–15.5)
RDW: 13.9 % (ref 11.5–15.5)
WBC: 5.6 10*3/uL (ref 4.0–10.5)
WBC: 4.9 10*3/uL (ref 4.0–10.5)
WBC: 6.5 10*3/uL (ref 4.0–10.5)
WBC: 5.6 10*3/uL (ref 4.0–10.5)

## 2015-08-08 LAB — COMPREHENSIVE METABOLIC PANEL
ALBUMIN: 3.5 g/dL (ref 3.5–5.0)
ALK PHOS: 51 U/L (ref 38–126)
ALT: 14 U/L (ref 14–54)
ANION GAP: 10 (ref 5–15)
AST: 25 U/L (ref 15–41)
BILIRUBIN TOTAL: 1 mg/dL (ref 0.3–1.2)
BUN: 5 mg/dL — ABNORMAL LOW (ref 6–20)
CALCIUM: 7.6 mg/dL — AB (ref 8.9–10.3)
CO2: 25 mmol/L (ref 22–32)
Chloride: 108 mmol/L (ref 101–111)
Creatinine, Ser: 0.6 mg/dL (ref 0.44–1.00)
Glucose, Bld: 120 mg/dL — ABNORMAL HIGH (ref 65–99)
POTASSIUM: 2.8 mmol/L — AB (ref 3.5–5.1)
Sodium: 143 mmol/L (ref 135–145)
TOTAL PROTEIN: 5.6 g/dL — AB (ref 6.5–8.1)

## 2015-08-08 LAB — LACTIC ACID, PLASMA: LACTIC ACID, VENOUS: 0.9 mmol/L (ref 0.5–2.0)

## 2015-08-08 LAB — C DIFFICILE QUICK SCREEN W PCR REFLEX
C DIFFICILE (CDIFF) TOXIN: NEGATIVE
C DIFFICLE (CDIFF) ANTIGEN: NEGATIVE
C Diff interpretation: NEGATIVE

## 2015-08-08 LAB — MRSA PCR SCREENING: MRSA BY PCR: POSITIVE — AB

## 2015-08-08 LAB — T4, FREE: FREE T4: 0.94 ng/dL (ref 0.61–1.12)

## 2015-08-08 MED ORDER — MUPIROCIN 2 % EX OINT
1.0000 "application " | TOPICAL_OINTMENT | Freq: Two times a day (BID) | CUTANEOUS | Status: DC
  Administered 2015-08-08 – 2015-08-10 (×5): 1 via NASAL
  Filled 2015-08-08 (×2): qty 22

## 2015-08-08 MED ORDER — INFLUENZA VAC SPLIT QUAD 0.5 ML IM SUSY
0.5000 mL | PREFILLED_SYRINGE | INTRAMUSCULAR | Status: AC
  Administered 2015-08-09: 0.5 mL via INTRAMUSCULAR
  Filled 2015-08-08: qty 0.5

## 2015-08-08 MED ORDER — CHLORHEXIDINE GLUCONATE CLOTH 2 % EX PADS
6.0000 | MEDICATED_PAD | Freq: Every day | CUTANEOUS | Status: DC
  Administered 2015-08-08 – 2015-08-10 (×3): 6 via TOPICAL

## 2015-08-08 NOTE — NC FL2 (Deleted)
[place] LEVEL OF CARE SCREENING TOOL     IDENTIFICATION  Patient Name: Kara Santos Birthdate: 1917/02/07 Sex: female Admission Date (Current Location): 08/07/2015  [facility] and [place] Number:     Facility and Address:  [facility] [address] [place]      Provider Number: [id number]  Attending Physician Name and Address:  Sinda Du, MD  Relative Name and Phone Number:       Current Level of Care: Hospital Recommended Level of Care: [place], Other (Comment) (Long term care) Prior Approval Number:    Date Approved/Denied:   PASRR Number:    Discharge Plan: Other (Comment) (ALF/Long Term Care)    Current Diagnoses: Patient Active Problem List   Diagnosis Date Noted  . GI bleed 08/07/2015  . Mandibular fracture, closed ([place]) 08/07/2015  . Acute encephalopathy 08/07/2015  . Hematochezia 08/07/2015  . Fall 11/20/2014  . Multiple rib fractures 11/17/2014  . AVM (arteriovenous malformation) of stomach, acquired with hemorrhage 12/04/2013  . Closed fracture of greater trochanter of femur ([place]) 02/06/2013  . History of fall 02/06/2013  . Iron deficiency anemia 05/05/2012  . Chronic gastrointestinal bleeding 03/04/2012  . Diabetes mellitus type 2 in nonobese ([place]) 03/04/2012  . Hypothyroidism 03/04/2012  . History of thyroid cancer 03/04/2012    Orientation ACTIVITIES/SOCIAL BLADDER RESPIRATION    Self  Active Continent Normal  BEHAVIORAL SYMPTOMS/MOOD NEUROLOGICAL BOWEL NUTRITION STATUS      Continent    PHYSICIAN VISITS COMMUNICATION OF NEEDS Height & Weight Skin    Verbally 4\' 11"  (149.9 cm) 97 lbs. Normal          AMBULATORY STATUS RESPIRATION    Supervision limited Normal      Personal Care Assistance Level of Assistance               Functional Limitations Info  Hearing   Hearing Info: Impaired         SPECIAL CARE FACTORS FREQUENCY                      Additional  Factors Info  Allergies Code Status Info:  (DNR) Allergies Info: Asa (Aspirin)           Current Medications (08/08/2015): Current Facility-Administered Medications  Medication Dose Route Frequency Provider Last Rate Last Dose  . 0.9 %  sodium chloride infusion   Intravenous Continuous Lavina Hamman, MD 100 mL/hr at 08/08/15 1140    . acetaminophen (TYLENOL) tablet 650 mg  650 mg Oral Q6H PRN Lavina Hamman, MD       Or  . acetaminophen (TYLENOL) suppository 650 mg  650 mg Rectal Q6H PRN Lavina Hamman, MD      . Chlorhexidine Gluconate Cloth 2 % PADS 6 each  6 each Topical Q0600 Sinda Du, MD   6 each at 08/08/15 1000  . ferrous sulfate tablet 325 mg  325 mg Oral Q breakfast Lavina Hamman, MD   325 mg at 08/08/15 0913  . levothyroxine (SYNTHROID, LEVOTHROID) tablet 100 mcg  100 mcg Oral QAC breakfast Lavina Hamman, MD   100 mcg at 08/08/15 0913  . mupirocin ointment (BACTROBAN) 2 % 1 application  1 application Nasal BID Sinda Du, MD   1 application at 38/10/17 0913  . ondansetron (ZOFRAN) tablet 4 mg  4 mg Oral Q6H PRN Lavina Hamman, MD       Or  . ondansetron [facility]) injection  4 mg  4 mg Intravenous Q6H PRN Lavina Hamman, MD      . pantoprazole (PROTONIX) injection 40 mg  40 mg Intravenous Q12H Lavina Hamman, MD   40 mg at 08/08/15 0913  . sodium chloride 0.9 % injection 3 mL  3 mL Intravenous Q12H Lavina Hamman, MD   3 mL at 08/08/15 0913   Do not use this list as official medication orders. Please verify with discharge summary.  Discharge Medications:   Medication List    ASK your doctor about these medications        acetaminophen 325 MG tablet  Commonly known as:  TYLENOL  Take 650 mg by mouth every 4 (four) hours as needed for pain.     cyanocobalamin 100 MCG tablet  Take 100 mcg by mouth daily.     ferrous sulfate 325 (65 FE) MG tablet  Take 325 mg by mouth daily with breakfast.     lansoprazole 30 MG capsule  Commonly known as:  PREVACID  Take  30 mg by mouth daily at 12 noon.     levothyroxine 100 MCG tablet  Commonly known as:  SYNTHROID, LEVOTHROID  Take 100 mcg by mouth daily before breakfast.     metFORMIN 500 MG 24 hr tablet  Commonly known as:  GLUCOPHAGE-XR  Take 500 mg by mouth daily with breakfast.     multivitamin tablet  Take 1 tablet by mouth daily.        Relevant Imaging Results:  Relevant Lab Results:  Recent Labs    Additional Information    Easten Maceachern, Clydene Pugh, LCSW

## 2015-08-08 NOTE — Evaluation (Signed)
Physical Therapy Evaluation Patient Details Name: Kara Santos MRN: [id number] DOB: 1916/11/02 Today's Date: 08/08/2015   History of Present Illness  Avin T Elsberry is a 81 y.o. female with Past medical history of AV malformation of the stomach, chronic GI bleeding, chronic iron deficiency anemia, diabetes mellitus, hypothyroidism, dementia, recurrent fall. The patient is presenting with complaints of confusion which has been ongoing for last few weeks progressively worsening, as well as generalized weakness and fatigue. Unsteady gait noted yesterday. Upon admission the patient denies HA, dizziness, angina, abd pain, nausea, burning urination. In October, pt on abx for UTI. Patient was recently seen in the ER for a fall and was found to be having mandibular fracture which is currently being managed conservatively. The patient is coming from home. At her baseline ambulates with walker. And is dependent for most of her ADL, but does not manages her medication on her own.  Clinical Impression  Pt demonstrating signs of mild cognitive decline at evaluation, hence recommending further in-depth cognitive evaluation from OT or ST to determine safety for D/C to home. Pt balance, strength, mobility, and coordination all demonstrating mild-moderate impairment, but likely at baseline. Pt description of falls at home, combined with formal balance screening creates a strong case to further investigate frequent falls as they relate to hemodynamics or medications. Pt report is consistent with falls related to hypotension, dehydration, and/or anemia. Having limited information from family at this time, PT recommending pt is unsafe to return to home, and will benefit from 24/7 supervision to assist with ADL, IADL, nutrition, and medications, that may play a more meaningful role in falls prevention at this time. Recommending D/C to LTC facility, or home with 24/7 supervision at discretion of physician, care management, and  family/caregivers, pending further assessment of cognition.     Follow Up Recommendations No PT follow up;Supervision/Assistance - 24 hour    Equipment Recommendations  None recommended by PT    Recommendations for Other Services       Precautions / Restrictions Precautions Precautions: None Restrictions Weight Bearing Restrictions: No      Mobility  Bed Mobility Overal bed mobility: Modified Independent Bed Mobility: Sidelying to Sit;Supine to Sit   Sidelying to sit: Modified independent (Device/Increase time) Supine to sit: Modified independent (Device/Increase time)        Transfers Overall transfer level: Needs assistance Equipment used: Rolling walker (2 wheeled) Transfers: Sit to/from Stand Sit to Stand: Supervision            Ambulation/Gait Ambulation/Gait assistance: Min guard Ambulation Distance (Feet): 60 Feet Assistive device: Rolling walker (2 wheeled)     Gait velocity interpretation: <1.8 ft/sec, indicative of risk for recurrent falls General Gait Details: Demonstrates safe use of RW. Very short steps, with slow gait speed.   Stairs            Wheelchair Mobility    Modified Rankin (Stroke Patients Only)       Balance Overall balance assessment: History of Falls;Modified Independent (Easy LOB with perturbation AP; more confusion with lateral perturbation testing. Forward reach is 10 inches. )                                           Pertinent Vitals/Pain Pain Assessment: No/denies pain    Home Living Family/patient expects to be discharged to:: Private residence Living Arrangements: Alone Available Help at Discharge: Family;Available  24 hours/day Type of Home: House Home Access: Stairs to enter Entrance Stairs-Rails: Right Entrance Stairs-Number of Steps: 5 Home Layout: One level        Prior Function                 Hand Dominance        Extremity/Trunk Assessment   Upper Extremity  Assessment: Generalized weakness;Overall [facility] for tasks assessed           Lower Extremity Assessment: Generalized weakness;Overall WFL for tasks assessed         Communication      Cognition Arousal/Alertness: Awake/alert Behavior During Therapy: WFL for tasks assessed/performed Overall Cognitive Status: No family/caregiver present to determine baseline cognitive functioning (Disoriented to time and situation. Unable to provide year or current president. )       Memory: Decreased short-term memory              General Comments      Exercises        Assessment/Plan    PT Assessment Patient needs continued PT services;Patent does not need any further PT services  PT Diagnosis Generalized weakness   PT Problem List Decreased balance;Decreased mobility  PT Treatment Interventions Gait training;Stair training;Therapeutic activities;Balance training   PT Goals (Current goals can be found in the Care Plan section) Acute Rehab PT Goals Patient Stated Goal: Pt want to go home adn to stop falling.  PT Goal Formulation: With patient Time For Goal Achievement: 08/22/15 Potential to Achieve Goals: Fair    Frequency Min 3X/week   Barriers to discharge Inaccessible home environment;Decreased caregiver support Pt demonstrating some cognitive deficits; recommending mor ein depth cognitive screening from OT or ST to determine safety of return to home.     Co-evaluation               End of Session Equipment Utilized During Treatment: Gait belt Activity Tolerance: Patient tolerated treatment well Patient left: in bed;with bed alarm set;with call bell/phone within reach Nurse Communication: Mobility status;Other (comment) (Speech Consult. )         Time: 0923-3007 PT Time Calculation (min) (ACUTE ONLY): 16 min   Charges:   PT Evaluation $Initial PT Evaluation Tier I: 1 Procedure     PT G Codes:        Buccola,Allan C 08/16/15, 1:48 PM 1:56 PM  Etta Grandchild, PT, DPT [place] License # [id number]

## 2015-08-08 NOTE — Consult Note (Signed)
Reason for Consult: GI bleed Referring Physician:   OLUCHI PUCCI is an 81 y.o. female.  HPI: Admitted thru the ED yesterday. She says she has been having rectal bleeding. She cannot tell me how she has been bleeding.  She has had some BRRB and some dark stools. (Patient is a poor historian). She tells me she fell in the yard but could not tell me when. Per records patient has been having periods of confusion and frequent falls. While in the ED she passed a large stool with blood.  There has been no nausea or vomiting. She denies fever.  Hx of anemia and had work up by Dr. Gala Romney in 2007.   Per records, she is not on NSAIDs.  She denies recent antibiotics. Per records she had been on an antibiotic for a UTI.  Presently being seen at [facility] at [facility] for iron deficiency anemia requiring iron infusions.   08/2006 Given Capsule: SUMMARY AND RECOMMENDATIONS: A few small gastric erosions. Numerous  small bowel AVMs as well as small bowel erosions, none with evidence of  active bleeding at the time of study. The study was reviewed by Dr.  Gala Romney. These small bowel findings could easily explain the patient's  iron deficiency anemia. I would recommend that she avoid aspirin and  NSAIDs if possible. Would continue to check periodic hemoglobin and  hematocrit. 08/2006 EGD and Colonoscopy:  CBCwell and was reactive to endoscopy.  COLONOSCOPY IMPRESSION: Normal rectum. Left-sided diverticulum and colonic  mucosa appeared normal.  EGD FINDINGS: Normal esophagus, small hiatal hernia, otherwise normal  stomach. Large duodenal, diverticulum, and D2, otherwise normal D1 and D2.  No explanation for patient's iron-deficiency anemia, based on today's    Past Medical History  Diagnosis Date  . Anemia 11/07/2011    chronic IDA requiring multiple transfusions  . Diabetes mellitus   . Hypothyroidism     s/p thyroid surgery for thyroid cancer  . GERD (gastroesophageal reflux disease)   . Iron  deficiency anemia 05/05/2012  . Depression   . Closed fracture of greater trochanter of femur ([place]) 02/2013    left  . AVM (arteriovenous malformation) of stomach, acquired with hemorrhage 12/04/2013  . Alzheimer's disease, early onset 32  . Fall at home 11/17/14    multiple rib fractures    Past Surgical History  Procedure Laterality Date  . Esophagogastroduodenoscopy  08/2006    Dr. Trevor Iha [facility], large duodenal diverticulum  . Colonoscopy  08/2006    Dr. Cecelia Byars sided diverticulosis  . Givens small bowel capsule  08/2006    few gastric erosions, numerous small bowel AVMs with SB erosions  . Double balloon enteroscopy  06/1008    [facility]-->APC of 12 SB AVMs in jejunum. 6 feet of jejunum inspected  . Right shoulder surgery    . Cholecystectomy    . Appendectomy    . Bilateral cataract extractions    . Breast reduction surgery    . Thyroid surgery      thyroid cancer    Family History  Problem Relation Age of Onset  . Cancer Mother   . Cancer Father   . Diabetes Father   . Diabetes Sister   . Cancer Brother     Social History:  reports that she has never smoked. She has never used smokeless tobacco. She reports that she does not drink alcohol or use illicit drugs.  Allergies:  Allergies  Allergen Reactions  . Asa [Aspirin]     Acid reflux  Medications: I have reviewed the patient's current medications.  Results for orders placed or performed during the hospital encounter of 08/07/15 (from the past 48 hour(s))  CBC with Differential     Status: Abnormal   Collection Time: 08/07/15 12:30 PM  Result Value Ref Range   WBC 6.6 4.0 - 10.5 K/uL   RBC 3.33 (L) 3.87 - 5.11 MIL/uL   Hemoglobin 10.3 (L) 12.0 - 15.0 g/dL   HCT 30.1 (L) 36.0 - 46.0 %   MCV 90.4 78.0 - 100.0 fL   MCH 30.9 26.0 - 34.0 pg   MCHC 34.2 30.0 - 36.0 g/dL   RDW 13.8 11.5 - 15.5 %   Platelets 127 (L) 150 - 400 K/uL   Neutrophils Relative % 83 %   Neutro Abs 5.4 1.7 - 7.7 K/uL    Lymphocytes Relative 9 %   Lymphs Abs 0.6 (L) 0.7 - 4.0 K/uL   Monocytes Relative 7 %   Monocytes Absolute 0.5 0.1 - 1.0 K/uL   Eosinophils Relative 1 %   Eosinophils Absolute 0.0 0.0 - 0.7 K/uL   Basophils Relative 0 %   Basophils Absolute 0.0 0.0 - 0.1 K/uL  Basic metabolic panel     Status: Abnormal   Collection Time: 08/07/15 12:30 PM  Result Value Ref Range   Sodium 139 135 - 145 mmol/L   Potassium 3.2 (L) 3.5 - 5.1 mmol/L   Chloride 103 101 - 111 mmol/L   CO2 25 22 - 32 mmol/L   Glucose, Bld 189 (H) 65 - 99 mg/dL   BUN 8 6 - 20 mg/dL   Creatinine, Ser 0.63 0.44 - 1.00 mg/dL   Calcium 8.3 (L) 8.9 - 10.3 mg/dL   GFR calc non Af Amer >60 >60 mL/min   GFR calc Af Amer >60 >60 mL/min    Comment: (NOTE) The eGFR has been calculated using the CKD EPI equation. This calculation has not been validated in all clinical situations. eGFR's persistently <60 mL/min signify possible Chronic Kidney Disease.    Anion gap 11 5 - 15  Troponin I     Status: None   Collection Time: 08/07/15 12:30 PM  Result Value Ref Range   Troponin I <0.03 <0.031 ng/mL    Comment:        NO INDICATION OF MYOCARDIAL INJURY.   TSH     Status: Abnormal   Collection Time: 08/07/15 12:30 PM  Result Value Ref Range   TSH 11.367 (H) 0.350 - 4.500 uIU/mL  Urinalysis, Routine w reflex microscopic (not at [facility])     Status: Abnormal   Collection Time: 08/07/15 12:35 PM  Result Value Ref Range   Color, Urine YELLOW YELLOW   APPearance CLEAR CLEAR   Specific Gravity, Urine 1.020 1.005 - 1.030   pH 6.5 5.0 - 8.0   Glucose, UA NEGATIVE NEGATIVE mg/dL   Hgb urine dipstick TRACE (A) NEGATIVE   Bilirubin Urine NEGATIVE NEGATIVE   Ketones, ur NEGATIVE NEGATIVE mg/dL   Protein, ur NEGATIVE NEGATIVE mg/dL   Urobilinogen, UA 0.2 0.0 - 1.0 mg/dL   Nitrite NEGATIVE NEGATIVE   Leukocytes, UA SMALL (A) NEGATIVE  Prealbumin     Status: Abnormal   Collection Time: 08/07/15 12:35 PM  Result Value Ref Range    Prealbumin 17.9 (L) 18 - 38 mg/dL    Comment: Performed at [facility]  Urine microscopic-add on     Status: None   Collection Time: 08/07/15 12:35 PM  Result Value Ref Range  Squamous Epithelial / LPF RARE RARE   WBC, UA 3-6 <3 WBC/hpf   RBC / HPF 0-2 <3 RBC/hpf  Acetaminophen level     Status: Abnormal   Collection Time: 08/07/15 12:38 PM  Result Value Ref Range   Acetaminophen (Tylenol), Serum <10 (L) 10 - 30 ug/mL    Comment:        THERAPEUTIC CONCENTRATIONS VARY SIGNIFICANTLY. A RANGE OF 10-30 ug/mL MAY BE AN EFFECTIVE CONCENTRATION FOR MANY PATIENTS. HOWEVER, SOME ARE BEST TREATED AT CONCENTRATIONS OUTSIDE THIS RANGE. ACETAMINOPHEN CONCENTRATIONS >150 ug/mL AT 4 HOURS AFTER INGESTION AND >50 ug/mL AT 12 HOURS AFTER INGESTION ARE OFTEN ASSOCIATED WITH TOXIC REACTIONS.   Brain natriuretic peptide     Status: None   Collection Time: 08/07/15 12:38 PM  Result Value Ref Range   B Natriuretic Peptide 55.0 0.0 - 100.0 pg/mL  Lactic acid, plasma     Status: Abnormal   Collection Time: 08/07/15 12:58 PM  Result Value Ref Range   Lactic Acid, Venous 3.0 (HH) 0.5 - 2.0 mmol/L    Comment: CRITICAL RESULT CALLED TO, READ BACK BY AND VERIFIED WITH: YOUNG, J AT 1334 ON 08/07/2015 BY AGUNDIZ, E.   POC occult blood, ED     Status: Abnormal   Collection Time: 08/07/15  2:22 PM  Result Value Ref Range   Fecal Occult Bld POSITIVE (A) NEGATIVE  Lactic acid, plasma     Status: Abnormal   Collection Time: 08/07/15  3:33 PM  Result Value Ref Range   Lactic Acid, Venous 2.2 (HH) 0.5 - 2.0 mmol/L    Comment: CRITICAL RESULT CALLED TO, READ BACK BY AND VERIFIED WITH: VOGLER,T ON 08/07/15 AT 1600 BY LOY,C   CBC     Status: Abnormal   Collection Time: 08/07/15  7:40 PM  Result Value Ref Range   WBC 6.3 4.0 - 10.5 K/uL   RBC 3.40 (L) 3.87 - 5.11 MIL/uL   Hemoglobin 10.5 (L) 12.0 - 15.0 g/dL   HCT 30.4 (L) 36.0 - 46.0 %   MCV 89.4 78.0 - 100.0 fL   MCH 30.9 26.0 - 34.0 pg    MCHC 34.5 30.0 - 36.0 g/dL   RDW 13.6 11.5 - 15.5 %   Platelets 140 (L) 150 - 400 K/uL  Protime-INR     Status: None   Collection Time: 08/07/15  7:40 PM  Result Value Ref Range   Prothrombin Time 15.2 11.6 - 15.2 seconds   INR 1.18 0.00 - 1.49  MRSA PCR Screening     Status: Abnormal   Collection Time: 08/07/15 11:25 PM  Result Value Ref Range   MRSA by PCR POSITIVE (A) NEGATIVE    Comment:        The GeneXpert MRSA Assay (FDA approved for NASAL specimens only), is one component of a comprehensive MRSA colonization surveillance program. It is not intended to diagnose MRSA infection nor to guide or monitor treatment for MRSA infections. RESULT CALLED TO, READ BACK BY AND VERIFIED WITH: COVINGTON L AT 0143 ON 110216 BY FORSYTH K   CBC     Status: Abnormal   Collection Time: 08/08/15  2:05 AM  Result Value Ref Range   WBC 5.6 4.0 - 10.5 K/uL   RBC 3.30 (L) 3.87 - 5.11 MIL/uL   Hemoglobin 10.1 (L) 12.0 - 15.0 g/dL   HCT 29.6 (L) 36.0 - 46.0 %   MCV 89.7 78.0 - 100.0 fL   MCH 30.6 26.0 - 34.0 pg   MCHC 34.1  30.0 - 36.0 g/dL   RDW 13.8 11.5 - 15.5 %   Platelets 143 (L) 150 - 400 K/uL    Ct Head Wo Contrast  08/07/2015  CLINICAL DATA:  Increased confusion at night.  Dementia. EXAM: CT HEAD WITHOUT CONTRAST TECHNIQUE: Contiguous axial images were obtained from the base of the skull through the vertex without intravenous contrast. COMPARISON:  08/04/2015 FINDINGS: There is atrophy and chronic small vessel disease changes. No acute intracranial abnormality. Specifically, no hemorrhage, hydrocephalus, mass lesion, acute infarction, or significant intracranial injury. No acute calvarial abnormality. Again noted is the fracture the right mandibular condyle, stable. IMPRESSION: No acute intracranial abnormality. Atrophy, chronic microvascular disease. Electronically Signed   By: Rolm Baptise M.D.   On: 08/07/2015 15:29   Ct Abdomen Pelvis W Contrast  08/07/2015  CLINICAL DATA:   Abdominal pain, blood in stool, increased confusion at night, back pain, nausea, personal history of dementia, at diabetes mellitus, thyroid cancer EXAM: CT ABDOMEN AND PELVIS WITH CONTRAST TECHNIQUE: Multidetector CT imaging of the abdomen and pelvis was performed using the standard protocol following bolus administration of intravenous contrast. Sagittal and coronal MPR images reconstructed from axial data set. CONTRAST:  50m OMNIPAQUE IOHEXOL 300 MG/ML SOLN IV. Dilute oral contrast. COMPARISON:  11/18/2014 FINDINGS: Minimal bibasilar atelectasis. Mitral annular calcification. Scattered atherosclerotic calcifications. Retro aortic LEFT renal vein. Post cholecystectomy and appendectomy by history. Tiny RIGHT renal cyst. Minimally nodular medial limb RIGHT adrenal gland, stable. Liver, spleen, atrophic pancreas, kidneys, and adrenal glands otherwise normal. Unremarkable bladder, ureters, uterus and adnexa. Wall thickening of rectum and distal sigmoid colon questioned. Stomach and bowel loops otherwise normal appearance. Tiny umbilical hernia containing fat. No mass, adenopathy, free air or free fluid. Numerous pelvic phleboliths. Multifactorial acquired spinal stenosis L4-L5 without acute osseous findings. IMPRESSION: Questionable wall thickening of the rectum and distal sigmoid colon, though this could be potentially related to artifact from underdistention. Endoscopy recommended to exclude distal colitis/proctitis. Multifactorial spinal stenosis L4-L5. Otherwise negative exam. Electronically Signed   By: MLavonia DanaM.D.   On: 08/07/2015 15:36   Dg Chest Port 1 View  08/07/2015  CLINICAL DATA:  Confusion. EXAM: PORTABLE CHEST 1 VIEW COMPARISON:  08/04/2015 FINDINGS: The heart size and mediastinal contours are within normal limits. Stable mild elevation of the right hemidiaphragm. There is no evidence of pulmonary edema, consolidation, pneumothorax, nodule or pleural fluid. The visualized skeletal structures  are unremarkable. IMPRESSION: No active disease. Electronically Signed   By: GAletta EdouardM.D.   On: 08/07/2015 13:32    Review of Systems  Cardiovascular: Negative for leg swelling.   Blood pressure 160/72, pulse 90, temperature 98 F (36.7 C), temperature source Oral, resp. rate 18, height '4\' 11"'  (1.499 m), weight 97 lb 4.8 oz (44.135 kg), SpO2 100 %. Physical Exam  Alert.Skin warm and dry. Oral mucosa is moist.   . Sclera anicteric, conjunctivae is pink. Thyroid not enlarged. No cervical lymphadenopathy. Lungs clear. Heart regular rate and rhythm. Loud murmur heard. Abdomen is soft. Bowel sounds are positive. No hepatomegaly. No abdominal masses felt. Tenderness rt lower abdomen.  No edema to lower extremities.    Assessment/Plan: GI bleed, small bowel AVMS noted in 2007 on Given Capsule. . Patient could have bleed from AVMs. C diff pending.  I wiill discuss with Dr. RLaural Golden    SETZER,TERRI W 08/08/2015, 8:10 AM     GI attending note;   Patient  IGala Murdochand examined earlier today. Patient's niece at bedside.   Patient is  78 year old Caucasian female who presents with acute GI bleed either from :     or from small bowel angiodysplasia which she has history of.   Patient not interested in further evaluation.     If bleeding continues will previsit options with the patient and her daughter [name] was    power of attorney.

## 2015-08-08 NOTE — NC FL2 (Deleted)
[place] LEVEL OF CARE SCREENING TOOL     IDENTIFICATION  Patient Name: Kara Santos Birthdate: 1917/03/07 Sex: female Admission Date (Current Location): 08/07/2015  [facility] and [place] Number:     Facility and Address:  [facility] [address], [place]      Provider Number: [id number]  Attending Physician Name and Address:  Sinda Du, MD  Relative Name and Phone Number:       Current Level of Care: Hospital Recommended Level of Care: [place], Other (Comment) (Long term care) Prior Approval Number:    Date Approved/Denied:   PASRR Number:    Discharge Plan: Other (Comment) (ALF/Long Term Care)    Current Diagnoses: Patient Active Problem List   Diagnosis Date Noted  . GI bleed 08/07/2015  . Mandibular fracture, closed (Kara Santos) 08/07/2015  . Acute encephalopathy 08/07/2015  . Hematochezia 08/07/2015  . Fall 11/20/2014  . Multiple rib fractures 11/17/2014  . AVM (arteriovenous malformation) of stomach, acquired with hemorrhage 12/04/2013  . Closed fracture of greater trochanter of femur (Kara Santos) 02/06/2013  . History of fall 02/06/2013  . Iron deficiency anemia 05/05/2012  . Chronic gastrointestinal bleeding 03/04/2012  . Diabetes mellitus type 2 in nonobese (Kara Santos) 03/04/2012  . Hypothyroidism 03/04/2012  . History of thyroid cancer 03/04/2012    Orientation ACTIVITIES/SOCIAL BLADDER RESPIRATION    Self  Active Continent Normal  BEHAVIORAL SYMPTOMS/MOOD NEUROLOGICAL BOWEL NUTRITION STATUS      Continent    PHYSICIAN VISITS COMMUNICATION OF NEEDS Height & Weight Skin    Verbally 4\' 11"  (149.9 cm) 97 lbs. Normal          AMBULATORY STATUS RESPIRATION    Supervision limited Normal      Personal Care Assistance Level of Assistance               Functional Limitations Info  Hearing   Hearing Info: Impaired         SPECIAL CARE FACTORS FREQUENCY                      Additional  Factors Info  Code Status (DNR)               Current Medications (08/08/2015): Current Facility-Administered Medications  Medication Dose Route Frequency Provider Last Rate Last Dose  . 0.9 %  sodium chloride infusion   Intravenous Continuous Lavina Hamman, MD 100 mL/hr at 08/08/15 1140    . acetaminophen (TYLENOL) tablet 650 mg  650 mg Oral Q6H PRN Lavina Hamman, MD       Or  . acetaminophen (TYLENOL) suppository 650 mg  650 mg Rectal Q6H PRN Lavina Hamman, MD      . Chlorhexidine Gluconate Cloth 2 % PADS 6 each  6 each Topical Q0600 Sinda Du, MD   6 each at 08/08/15 1000  . ferrous sulfate tablet 325 mg  325 mg Oral Q breakfast Lavina Hamman, MD   325 mg at 08/08/15 0913  . levothyroxine (SYNTHROID, LEVOTHROID) tablet 100 mcg  100 mcg Oral QAC breakfast Lavina Hamman, MD   100 mcg at 08/08/15 0913  . mupirocin ointment (BACTROBAN) 2 % 1 application  1 application Nasal BID Sinda Du, MD   1 application at 76/22/63 0913  . ondansetron (ZOFRAN) tablet 4 mg  4 mg Oral Q6H PRN Lavina Hamman, MD       Or  . ondansetron [facility]) injection 4 mg  4 mg Intravenous Q6H PRN Lavina Hamman, MD      . pantoprazole (PROTONIX) injection 40 mg  40 mg Intravenous Q12H Lavina Hamman, MD   40 mg at 08/08/15 0913  . sodium chloride 0.9 % injection 3 mL  3 mL Intravenous Q12H Lavina Hamman, MD   3 mL at 08/08/15 0913   Do not use this list as official medication orders. Please verify with discharge summary.  Discharge Medications:   Medication List    ASK your doctor about these medications        acetaminophen 325 MG tablet  Commonly known as:  TYLENOL  Take 650 mg by mouth every 4 (four) hours as needed for pain.     cyanocobalamin 100 MCG tablet  Take 100 mcg by mouth daily.     ferrous sulfate 325 (65 FE) MG tablet  Take 325 mg by mouth daily with breakfast.     lansoprazole 30 MG capsule  Commonly known as:  PREVACID  Take 30 mg by mouth daily at 12 noon.      levothyroxine 100 MCG tablet  Commonly known as:  SYNTHROID, LEVOTHROID  Take 100 mcg by mouth daily before breakfast.     metFORMIN 500 MG 24 hr tablet  Commonly known as:  GLUCOPHAGE-XR  Take 500 mg by mouth daily with breakfast.     multivitamin tablet  Take 1 tablet by mouth daily.        Relevant Imaging Results:  Relevant Lab Results:  Recent Labs    Additional Information    Kara Gully, LCSW [phone]

## 2015-08-08 NOTE — Clinical Social Work Note (Signed)
Clinical Social Work Assessment  Patient Details  Name: Kara Santos MRN: [id number] Date of Birth: 1917-09-07  Date of referral:  08/08/15               Reason for consult:  Facility Placement                Permission sought to share information with:    Permission granted to share information::     Name::        Agency::     Relationship::     Contact Information:     Housing/Transportation Living arrangements for the past 2 months:  Single Family Home Source of Information:  Adult Children, Patient Patient Interpreter Needed:  None Criminal Activity/Legal Involvement Pertinent to Current Situation/Hospitalization:  No - Comment as needed Significant Relationships:  Adult Children Lives with:  Self Do you feel safe going back to the place where you live?  Yes Need for family participation in patient care:  Yes (Comment)  Care giving concerns:  None identified.   Social Worker assessment / plan:  Patient's daughter, Kara Santos, was at bedside and provided most of the history.  Kara Santos advised that patient lives alone, ambulates independently (but also has a walker that she rarely uses), completes ADLs independently and sometimes drives herself to the grocery store.  She stated that she lives right behind patient and assist her daily.  Kara Santos indicated that patient does not take her medication as prescribed. She advised that patient will take her medication for her thyroid and diabetes appropriately but not her other medications.  She advised that patient's appetite is also decreasing.    Employment status:  Retired Forensic scientist:  Medicare PT Recommendations:  Not assessed at this time [place] / Referral to community resources:  [place] (ALF list)  Patient/Family's Response to care:  Family is agreeable to placement   Patient/Family's Understanding of and Emotional Response to Diagnosis, Current Treatment, and Prognosis: Family verbalizes understanding of  patient's diagnosis, treatment and prognosis.   Emotional Assessment Appearance:  Appears stated age Attitude/Demeanor/Rapport:   (Cooperative) Affect (typically observed):  Calm Orientation:  Fluctuating Orientation (Suspected and/or reported Sundowners) Alcohol / Substance use:  Not Applicable Psych involvement (Current and /or in the community):  No (Comment)  Discharge Needs  Concerns to be addressed:  Discharge Planning Concerns Readmission within the last 30 days:  No Current discharge risk:  None Barriers to Discharge:  No Barriers Identified   Ihor Gully, LCSW 08/08/2015, 1:25 PM [phone]

## 2015-08-08 NOTE — Progress Notes (Signed)
Subjective: This is a clarification of her history. While I was still at the hospital tied up with critically ill patient the patient and her caretaker came to my office. They initially were inquiring about how to get into a assisted living facility and we explained that to her caretaker. However her caretaker then went on to say that she has had multiple bouts of syncope which are without any sort of warning has been having diarrhea with black stool and is very weak. One of her syncopal episodes ended up causing her to have a fractured mandible. Today she says that she passes out without warning. She was sent to the emergency department because of her medical problems not the social issue of needing a higher level of care. I do believe that she is going to require a higher level of care ultimately however. Her CT scan shows what looks like some colitis.  Objective: Vital signs in last 24 hours: Temp:  [97.4 F (36.3 C)-98.2 F (36.8 C)] 98 F (36.7 C) (11/02 0629) Pulse Rate:  [74-90] 90 (11/02 0629) Resp:  [14-18] 18 (11/02 0629) BP: (137-182)/(64-89) 160/72 mmHg (11/02 0629) SpO2:  [95 %-100 %] 100 % (11/02 0629) Weight:  [43.092 kg (95 lb)-44.407 kg (97 lb 14.4 oz)] 44.135 kg (97 lb 4.8 oz) (11/02 0629) Weight change:  Last BM Date: 08/07/15  Intake/Output from previous day: 11/01 0701 - 11/02 0700 In: -  Out: 2 [Urine:1; Stool:1]  PHYSICAL EXAM General appearance: alert, cooperative and Mildly confused Resp: clear to auscultation bilaterally Cardio: regular rate and rhythm, S1, S2 normal, no murmur, click, rub or gallop GI: soft, non-tender; bowel sounds normal; no masses,  no organomegaly Extremities: extremities normal, atraumatic, no cyanosis or edema  Lab Results:  Results for orders placed or performed during the hospital encounter of 08/07/15 (from the past 48 hour(s))  CBC with Differential     Status: Abnormal   Collection Time: 08/07/15 12:30 PM  Result Value Ref Range    WBC 6.6 4.0 - 10.5 K/uL   RBC 3.33 (L) 3.87 - 5.11 MIL/uL   Hemoglobin 10.3 (L) 12.0 - 15.0 g/dL   HCT 30.1 (L) 36.0 - 46.0 %   MCV 90.4 78.0 - 100.0 fL   MCH 30.9 26.0 - 34.0 pg   MCHC 34.2 30.0 - 36.0 g/dL   RDW 13.8 11.5 - 15.5 %   Platelets 127 (L) 150 - 400 K/uL   Neutrophils Relative % 83 %   Neutro Abs 5.4 1.7 - 7.7 K/uL   Lymphocytes Relative 9 %   Lymphs Abs 0.6 (L) 0.7 - 4.0 K/uL   Monocytes Relative 7 %   Monocytes Absolute 0.5 0.1 - 1.0 K/uL   Eosinophils Relative 1 %   Eosinophils Absolute 0.0 0.0 - 0.7 K/uL   Basophils Relative 0 %   Basophils Absolute 0.0 0.0 - 0.1 K/uL  Basic metabolic panel     Status: Abnormal   Collection Time: 08/07/15 12:30 PM  Result Value Ref Range   Sodium 139 135 - 145 mmol/L   Potassium 3.2 (L) 3.5 - 5.1 mmol/L   Chloride 103 101 - 111 mmol/L   CO2 25 22 - 32 mmol/L   Glucose, Bld 189 (H) 65 - 99 mg/dL   BUN 8 6 - 20 mg/dL   Creatinine, Ser 0.63 0.44 - 1.00 mg/dL   Calcium 8.3 (L) 8.9 - 10.3 mg/dL   GFR calc non Af Amer >60 >60 mL/min   GFR calc Af  Amer >60 >60 mL/min    Comment: (NOTE) The eGFR has been calculated using the CKD EPI equation. This calculation has not been validated in all clinical situations. eGFR's persistently <60 mL/min signify possible Chronic Kidney Disease.    Anion gap 11 5 - 15  Troponin I     Status: None   Collection Time: 08/07/15 12:30 PM  Result Value Ref Range   Troponin I <0.03 <0.031 ng/mL    Comment:        NO INDICATION OF MYOCARDIAL INJURY.   TSH     Status: Abnormal   Collection Time: 08/07/15 12:30 PM  Result Value Ref Range   TSH 11.367 (H) 0.350 - 4.500 uIU/mL  Urinalysis, Routine w reflex microscopic (not at [facility])     Status: Abnormal   Collection Time: 08/07/15 12:35 PM  Result Value Ref Range   Color, Urine YELLOW YELLOW   APPearance CLEAR CLEAR   Specific Gravity, Urine 1.020 1.005 - 1.030   pH 6.5 5.0 - 8.0   Glucose, UA NEGATIVE NEGATIVE mg/dL   Hgb urine dipstick  TRACE (A) NEGATIVE   Bilirubin Urine NEGATIVE NEGATIVE   Ketones, ur NEGATIVE NEGATIVE mg/dL   Protein, ur NEGATIVE NEGATIVE mg/dL   Urobilinogen, UA 0.2 0.0 - 1.0 mg/dL   Nitrite NEGATIVE NEGATIVE   Leukocytes, UA SMALL (A) NEGATIVE  Prealbumin     Status: Abnormal   Collection Time: 08/07/15 12:35 PM  Result Value Ref Range   Prealbumin 17.9 (L) 18 - 38 mg/dL    Comment: Performed at [facility]  Urine microscopic-add on     Status: None   Collection Time: 08/07/15 12:35 PM  Result Value Ref Range   Squamous Epithelial / LPF RARE RARE   WBC, UA 3-6 <3 WBC/hpf   RBC / HPF 0-2 <3 RBC/hpf  Acetaminophen level     Status: Abnormal   Collection Time: 08/07/15 12:38 PM  Result Value Ref Range   Acetaminophen (Tylenol), Serum <10 (L) 10 - 30 ug/mL    Comment:        THERAPEUTIC CONCENTRATIONS VARY SIGNIFICANTLY. A RANGE OF 10-30 ug/mL MAY BE AN EFFECTIVE CONCENTRATION FOR MANY PATIENTS. HOWEVER, SOME ARE BEST TREATED AT CONCENTRATIONS OUTSIDE THIS RANGE. ACETAMINOPHEN CONCENTRATIONS >150 ug/mL AT 4 HOURS AFTER INGESTION AND >50 ug/mL AT 12 HOURS AFTER INGESTION ARE OFTEN ASSOCIATED WITH TOXIC REACTIONS.   Brain natriuretic peptide     Status: None   Collection Time: 08/07/15 12:38 PM  Result Value Ref Range   B Natriuretic Peptide 55.0 0.0 - 100.0 pg/mL  Lactic acid, plasma     Status: Abnormal   Collection Time: 08/07/15 12:58 PM  Result Value Ref Range   Lactic Acid, Venous 3.0 (HH) 0.5 - 2.0 mmol/L    Comment: CRITICAL RESULT CALLED TO, READ BACK BY AND VERIFIED WITH: YOUNG, J AT 1334 ON 08/07/2015 BY AGUNDIZ, E.   POC occult blood, ED     Status: Abnormal   Collection Time: 08/07/15  2:22 PM  Result Value Ref Range   Fecal Occult Bld POSITIVE (A) NEGATIVE  Lactic acid, plasma     Status: Abnormal   Collection Time: 08/07/15  3:33 PM  Result Value Ref Range   Lactic Acid, Venous 2.2 (HH) 0.5 - 2.0 mmol/L    Comment: CRITICAL RESULT CALLED TO, READ BACK BY  AND VERIFIED WITH: VOGLER,T ON 08/07/15 AT 1600 BY LOY,C   CBC     Status: Abnormal   Collection Time: 08/07/15  7:40 PM  Result Value Ref Range   WBC 6.3 4.0 - 10.5 K/uL   RBC 3.40 (L) 3.87 - 5.11 MIL/uL   Hemoglobin 10.5 (L) 12.0 - 15.0 g/dL   HCT 30.4 (L) 36.0 - 46.0 %   MCV 89.4 78.0 - 100.0 fL   MCH 30.9 26.0 - 34.0 pg   MCHC 34.5 30.0 - 36.0 g/dL   RDW 13.6 11.5 - 15.5 %   Platelets 140 (L) 150 - 400 K/uL  Protime-INR     Status: None   Collection Time: 08/07/15  7:40 PM  Result Value Ref Range   Prothrombin Time 15.2 11.6 - 15.2 seconds   INR 1.18 0.00 - 1.49  MRSA PCR Screening     Status: Abnormal   Collection Time: 08/07/15 11:25 PM  Result Value Ref Range   MRSA by PCR POSITIVE (A) NEGATIVE    Comment:        The GeneXpert MRSA Assay (FDA approved for NASAL specimens only), is one component of a comprehensive MRSA colonization surveillance program. It is not intended to diagnose MRSA infection nor to guide or monitor treatment for MRSA infections. RESULT CALLED TO, READ BACK BY AND VERIFIED WITH: COVINGTON L AT 0143 ON 110216 BY FORSYTH K   CBC     Status: Abnormal   Collection Time: 08/08/15  2:05 AM  Result Value Ref Range   WBC 5.6 4.0 - 10.5 K/uL   RBC 3.30 (L) 3.87 - 5.11 MIL/uL   Hemoglobin 10.1 (L) 12.0 - 15.0 g/dL   HCT 29.6 (L) 36.0 - 46.0 %   MCV 89.7 78.0 - 100.0 fL   MCH 30.6 26.0 - 34.0 pg   MCHC 34.1 30.0 - 36.0 g/dL   RDW 13.8 11.5 - 15.5 %   Platelets 143 (L) 150 - 400 K/uL  Comprehensive metabolic panel     Status: Abnormal   Collection Time: 08/08/15  7:33 AM  Result Value Ref Range   Sodium 143 135 - 145 mmol/L   Potassium 2.8 (L) 3.5 - 5.1 mmol/L   Chloride 108 101 - 111 mmol/L   CO2 25 22 - 32 mmol/L   Glucose, Bld 120 (H) 65 - 99 mg/dL   BUN 5 (L) 6 - 20 mg/dL   Creatinine, Ser 0.60 0.44 - 1.00 mg/dL   Calcium 7.6 (L) 8.9 - 10.3 mg/dL   Total Protein 5.6 (L) 6.5 - 8.1 g/dL   Albumin 3.5 3.5 - 5.0 g/dL   AST 25 15 - 41 U/L    ALT 14 14 - 54 U/L   Alkaline Phosphatase 51 38 - 126 U/L   Total Bilirubin 1.0 0.3 - 1.2 mg/dL   GFR calc non Af Amer >60 >60 mL/min   GFR calc Af Amer >60 >60 mL/min    Comment: (NOTE) The eGFR has been calculated using the CKD EPI equation. This calculation has not been validated in all clinical situations. eGFR's persistently <60 mL/min signify possible Chronic Kidney Disease.    Anion gap 10 5 - 15  CBC     Status: Abnormal   Collection Time: 08/08/15  7:33 AM  Result Value Ref Range   WBC 4.9 4.0 - 10.5 K/uL   RBC 3.31 (L) 3.87 - 5.11 MIL/uL   Hemoglobin 10.2 (L) 12.0 - 15.0 g/dL   HCT 29.7 (L) 36.0 - 46.0 %   MCV 89.7 78.0 - 100.0 fL   MCH 30.8 26.0 - 34.0 pg   MCHC 34.3 30.0 -  36.0 g/dL   RDW 13.8 11.5 - 15.5 %   Platelets 139 (L) 150 - 400 K/uL  Lactic acid, plasma     Status: None   Collection Time: 08/08/15  7:33 AM  Result Value Ref Range   Lactic Acid, Venous 0.9 0.5 - 2.0 mmol/L    ABGS No results for input(s): PHART, PO2ART, TCO2, HCO3 in the last 72 hours.  Invalid input(s): PCO2 CULTURES Recent Results (from the past 240 hour(s))  Urine culture     Status: None   Collection Time: 08/04/15  3:24 PM  Result Value Ref Range Status   Specimen Description URINE, CLEAN CATCH  Final   Special Requests NONE  Final   Culture   Final    1,000 COLONIES/mL INSIGNIFICANT GROWTH Performed at [facility]    Report Status 08/06/2015 FINAL  Final  MRSA PCR Screening     Status: Abnormal   Collection Time: 08/07/15 11:25 PM  Result Value Ref Range Status   MRSA by PCR POSITIVE (A) NEGATIVE Final    Comment:        The GeneXpert MRSA Assay (FDA approved for NASAL specimens only), is one component of a comprehensive MRSA colonization surveillance program. It is not intended to diagnose MRSA infection nor to guide or monitor treatment for MRSA infections. RESULT CALLED TO, READ BACK BY AND VERIFIED WITH: COVINGTON L AT 0143 ON 110216 BY FORSYTH K     Studies/Results: Ct Head Wo Contrast  08/07/2015  CLINICAL DATA:  Increased confusion at night.  Dementia. EXAM: CT HEAD WITHOUT CONTRAST TECHNIQUE: Contiguous axial images were obtained from the base of the skull through the vertex without intravenous contrast. COMPARISON:  08/04/2015 FINDINGS: There is atrophy and chronic small vessel disease changes. No acute intracranial abnormality. Specifically, no hemorrhage, hydrocephalus, mass lesion, acute infarction, or significant intracranial injury. No acute calvarial abnormality. Again noted is the fracture the right mandibular condyle, stable. IMPRESSION: No acute intracranial abnormality. Atrophy, chronic microvascular disease. Electronically Signed   By: Rolm Baptise M.D.   On: 08/07/2015 15:29   Ct Abdomen Pelvis W Contrast  08/07/2015  CLINICAL DATA:  Abdominal pain, blood in stool, increased confusion at night, back pain, nausea, personal history of dementia, at diabetes mellitus, thyroid cancer EXAM: CT ABDOMEN AND PELVIS WITH CONTRAST TECHNIQUE: Multidetector CT imaging of the abdomen and pelvis was performed using the standard protocol following bolus administration of intravenous contrast. Sagittal and coronal MPR images reconstructed from axial data set. CONTRAST:  76m OMNIPAQUE IOHEXOL 300 MG/ML SOLN IV. Dilute oral contrast. COMPARISON:  11/18/2014 FINDINGS: Minimal bibasilar atelectasis. Mitral annular calcification. Scattered atherosclerotic calcifications. Retro aortic LEFT renal vein. Post cholecystectomy and appendectomy by history. Tiny RIGHT renal cyst. Minimally nodular medial limb RIGHT adrenal gland, stable. Liver, spleen, atrophic pancreas, kidneys, and adrenal glands otherwise normal. Unremarkable bladder, ureters, uterus and adnexa. Wall thickening of rectum and distal sigmoid colon questioned. Stomach and bowel loops otherwise normal appearance. Tiny umbilical hernia containing fat. No mass, adenopathy, free air or free fluid.  Numerous pelvic phleboliths. Multifactorial acquired spinal stenosis L4-L5 without acute osseous findings. IMPRESSION: Questionable wall thickening of the rectum and distal sigmoid colon, though this could be potentially related to artifact from underdistention. Endoscopy recommended to exclude distal colitis/proctitis. Multifactorial spinal stenosis L4-L5. Otherwise negative exam. Electronically Signed   By: MLavonia DanaM.D.   On: 08/07/2015 15:36   Dg Chest Port 1 View  08/07/2015  CLINICAL DATA:  Confusion. EXAM: PORTABLE CHEST 1 VIEW  COMPARISON:  08/04/2015 FINDINGS: The heart size and mediastinal contours are within normal limits. Stable mild elevation of the right hemidiaphragm. There is no evidence of pulmonary edema, consolidation, pneumothorax, nodule or pleural fluid. The visualized skeletal structures are unremarkable. IMPRESSION: No active disease. Electronically Signed   By: Aletta Edouard M.D.   On: 08/07/2015 13:32    Medications:  Prior to Admission:  Prescriptions prior to admission  Medication Sig Dispense Refill Last Dose  . acetaminophen (TYLENOL) 325 MG tablet Take 650 mg by mouth every 4 (four) hours as needed for pain.   08/07/2015 at Unknown time  . cyanocobalamin 100 MCG tablet Take 100 mcg by mouth daily.   08/07/2015 at Unknown time  . ferrous sulfate 325 (65 FE) MG tablet Take 325 mg by mouth daily with breakfast.   08/07/2015 at Unknown time  . lansoprazole (PREVACID) 30 MG capsule Take 30 mg by mouth daily at 12 noon.   08/07/2015 at Unknown time  . levothyroxine (SYNTHROID, LEVOTHROID) 100 MCG tablet Take 100 mcg by mouth daily before breakfast.   08/07/2015 at Unknown time  . metFORMIN (GLUCOPHAGE-XR) 500 MG 24 hr tablet Take 500 mg by mouth daily with breakfast.    08/07/2015 at Unknown time  . Multiple Vitamin (MULTIVITAMIN) tablet Take 1 tablet by mouth daily.   08/07/2015 at Unknown time   Scheduled: . Chlorhexidine Gluconate Cloth  6 each Topical Q0600  . ferrous  sulfate  325 mg Oral Q breakfast  . levothyroxine  100 mcg Oral QAC breakfast  . mupirocin ointment  1 application Nasal BID  . pantoprazole (PROTONIX) IV  40 mg Intravenous Q12H  . sodium chloride  3 mL Intravenous Q12H   Continuous: . sodium chloride 100 mL/hr at 08/08/15 0108   WHQ:[id number] **OR** acetaminophen, ondansetron **OR** ondansetron (ZOFRAN) IV  Assesment: She has GI bleeding which is acute on chronic. 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 This is going to need some workup.  She has what looks like colitis on her CT scan and it consultation has been requested with GI.  She has chronic iron deficiency anemia  She has dementia at baseline. Principal Problem:   GI bleed Active Problems:   Chronic gastrointestinal bleeding   Diabetes mellitus type 2 in nonobese (HCC)   Hypothyroidism   Iron deficiency anemia   History of fall   Mandibular fracture, closed ([place])   Acute encephalopathy   Hematochezia    Plan: She will need cardiac monitor. She will need orthostatic blood pressure checks. CT of the brain. Echocardiogram. GI consultation    LOS: 1 day   Tameyah Koch L 08/08/2015, 9:01 AM

## 2015-08-08 NOTE — NC FL2 (Signed)
[place] LEVEL OF CARE SCREENING TOOL     IDENTIFICATION  Patient Name: Kara Santos Birthdate: 05-Aug-1917 Sex: female Admission Date (Current Location): 08/07/2015  [facility] and [place] Number:     Facility and Address:  [facility] [address] [place]      Provider Number: [id number]  Attending Physician Name and Address:  Sinda Du, MD  Relative Name and Phone Number:       Current Level of Care: Hospital Recommended Level of Care: [place], Other (Comment) (Long term care) Prior Approval Number:    Date Approved/Denied:   PASRR Number:    Discharge Plan: Other (Comment) (ALF/Long Term Care)    Current Diagnoses: Patient Active Problem List   Diagnosis Date Noted  . GI bleed 08/07/2015  . Mandibular fracture, closed ([place]) 08/07/2015  . Acute encephalopathy 08/07/2015  . Hematochezia 08/07/2015  . Fall 11/20/2014  . Multiple rib fractures 11/17/2014  . AVM (arteriovenous malformation) of stomach, acquired with hemorrhage 12/04/2013  . Closed fracture of greater trochanter of femur ([place]) 02/06/2013  . History of fall 02/06/2013  . Iron deficiency anemia 05/05/2012  . Chronic gastrointestinal bleeding 03/04/2012  . Diabetes mellitus type 2 in nonobese ([place]) 03/04/2012  . Hypothyroidism 03/04/2012  . History of thyroid cancer 03/04/2012    Orientation ACTIVITIES/SOCIAL BLADDER RESPIRATION    Self  Active Continent Normal  BEHAVIORAL SYMPTOMS/MOOD NEUROLOGICAL BOWEL NUTRITION STATUS      Continent    PHYSICIAN VISITS COMMUNICATION OF NEEDS Height & Weight Skin    Verbally 4\' 11"  (149.9 cm) 97 lbs. Normal          AMBULATORY STATUS RESPIRATION    Supervision limited Normal      Personal Care Assistance Level of Assistance               Functional Limitations Info  Hearing   Hearing Info: Impaired         SPECIAL CARE FACTORS FREQUENCY                      Additional  Factors Info  Allergies Code Status Info:  (DNR) Allergies Info: Asa (Aspirin)           Current Medications (08/08/2015): Current Facility-Administered Medications  Medication Dose Route Frequency Provider Last Rate Last Dose  . 0.9 %  sodium chloride infusion   Intravenous Continuous Lavina Hamman, MD 100 mL/hr at 08/08/15 1140    . acetaminophen (TYLENOL) tablet 650 mg  650 mg Oral Q6H PRN Lavina Hamman, MD       Or  . acetaminophen (TYLENOL) suppository 650 mg  650 mg Rectal Q6H PRN Lavina Hamman, MD      . Chlorhexidine Gluconate Cloth 2 % PADS 6 each  6 each Topical Q0600 Sinda Du, MD   6 each at 08/08/15 1000  . ferrous sulfate tablet 325 mg  325 mg Oral Q breakfast Lavina Hamman, MD   325 mg at 08/08/15 0913  . levothyroxine (SYNTHROID, LEVOTHROID) tablet 100 mcg  100 mcg Oral QAC breakfast Lavina Hamman, MD   100 mcg at 08/08/15 0913  . mupirocin ointment (BACTROBAN) 2 % 1 application  1 application Nasal BID Sinda Du, MD   1 application at 58/52/77 0913  . ondansetron (ZOFRAN) tablet 4 mg  4 mg Oral Q6H PRN Lavina Hamman, MD       Or  . ondansetron [facility]) injection  4 mg  4 mg Intravenous Q6H PRN Lavina Hamman, MD      . pantoprazole (PROTONIX) injection 40 mg  40 mg Intravenous Q12H Lavina Hamman, MD   40 mg at 08/08/15 0913  . sodium chloride 0.9 % injection 3 mL  3 mL Intravenous Q12H Lavina Hamman, MD   3 mL at 08/08/15 0913   Do not use this list as official medication orders. Please verify with discharge summary.  Discharge Medications:   Medication List    ASK your doctor about these medications        acetaminophen 325 MG tablet  Commonly known as:  TYLENOL  Take 650 mg by mouth every 4 (four) hours as needed for pain.     cyanocobalamin 100 MCG tablet  Take 100 mcg by mouth daily.     ferrous sulfate 325 (65 FE) MG tablet  Take 325 mg by mouth daily with breakfast.     lansoprazole 30 MG capsule  Commonly known as:  PREVACID  Take  30 mg by mouth daily at 12 noon.     levothyroxine 100 MCG tablet  Commonly known as:  SYNTHROID, LEVOTHROID  Take 100 mcg by mouth daily before breakfast.     metFORMIN 500 MG 24 hr tablet  Commonly known as:  GLUCOPHAGE-XR  Take 500 mg by mouth daily with breakfast.     multivitamin tablet  Take 1 tablet by mouth daily.        Relevant Imaging Results:  Relevant Lab Results:  Recent Labs    Additional Information  (MRSA, contact and enteric precautions)  Regena Delucchi D, LCSW

## 2015-08-08 NOTE — Care Management Note (Signed)
Case Management Note  Patient Details  Name: ARIAL GALLIGAN MRN: [id number] Date of Birth: 07/14/1917  Subjective/Objective:                  Pt admitted with GIB. Pt from home, lives alone, has dementia and has recently been falling frequently. Pt's daughter requesting placement at DC. PT has recommended 24/7 supervision or ALF.   Action/Plan: CSW is aware of need for placement need and will work with/pt family to arrange for placement at DC. Will cont to follow for DC planning.   Expected Discharge Date:    08/10/2015              Expected Discharge Plan:  Assisted Living / Rest Home  In-House Referral:  Clinical Social Work  Discharge planning Services  CM Consult  Post Acute Care Choice:  NA Choice offered to:  NA  DME Arranged:    DME Agency:     HH Arranged:    HH Agency:     Status of Service:  In process, will continue to follow  Medicare Important Message Given:    Date Medicare IM Given:    Medicare IM give by:    Date Additional Medicare IM Given:    Additional Medicare Important Message give by:     If discussed at [place] of Stay Meetings, dates discussed:    Additional Comments:  Sherald Barge, RN 08/08/2015, 2:23 PM

## 2015-08-09 ENCOUNTER — Inpatient Hospital Stay (HOSPITAL_COMMUNITY): Payer: Medicare Other

## 2015-08-09 DIAGNOSIS — K922 Gastrointestinal hemorrhage, unspecified: Secondary | ICD-10-CM | POA: Diagnosis not present

## 2015-08-09 DIAGNOSIS — R55 Syncope and collapse: Secondary | ICD-10-CM

## 2015-08-09 LAB — CBC WITH DIFFERENTIAL/PLATELET
BASOS ABS: 0 10*3/uL (ref 0.0–0.1)
BASOS PCT: 1 %
EOS PCT: 3 %
Eosinophils Absolute: 0.1 10*3/uL (ref 0.0–0.7)
HEMATOCRIT: 30.4 % — AB (ref 36.0–46.0)
Hemoglobin: 10.2 g/dL — ABNORMAL LOW (ref 12.0–15.0)
LYMPHS PCT: 21 %
Lymphs Abs: 1.1 10*3/uL (ref 0.7–4.0)
MCH: 30.3 pg (ref 26.0–34.0)
MCHC: 33.6 g/dL (ref 30.0–36.0)
MCV: 90.2 fL (ref 78.0–100.0)
MONO ABS: 0.5 10*3/uL (ref 0.1–1.0)
MONOS PCT: 9 %
NEUTROS ABS: 3.5 10*3/uL (ref 1.7–7.7)
Neutrophils Relative %: 66 %
PLATELETS: 142 10*3/uL — AB (ref 150–400)
RBC: 3.37 MIL/uL — ABNORMAL LOW (ref 3.87–5.11)
RDW: 13.8 % (ref 11.5–15.5)
WBC: 5.1 10*3/uL (ref 4.0–10.5)

## 2015-08-09 LAB — BASIC METABOLIC PANEL
Anion gap: 8 (ref 5–15)
CALCIUM: 7.3 mg/dL — AB (ref 8.9–10.3)
CO2: 27 mmol/L (ref 22–32)
CREATININE: 0.62 mg/dL (ref 0.44–1.00)
Chloride: 111 mmol/L (ref 101–111)
GFR calc Af Amer: >60 mL/min (ref >60)
GLUCOSE: 118 mg/dL — AB (ref 65–99)
POTASSIUM: 2.5 mmol/L — AB (ref 3.5–5.1)
SODIUM: 146 mmol/L — AB (ref 135–145)

## 2015-08-09 LAB — URINE CULTURE

## 2015-08-09 MED ORDER — POTASSIUM CHLORIDE CRYS ER 20 MEQ PO TBCR
40.0000 meq | EXTENDED_RELEASE_TABLET | Freq: Two times a day (BID) | ORAL | Status: AC
  Administered 2015-08-09 (×2): 40 meq via ORAL
  Filled 2015-08-09 (×2): qty 2

## 2015-08-09 NOTE — Progress Notes (Signed)
Patient critical potassium 2.5. Dr. Luan Pulling notified.

## 2015-08-09 NOTE — Progress Notes (Signed)
Subjective: She says she feels better. She has still been confused. Her potassium level is low today  Objective: Vital signs in last 24 hours: Temp:  [97.5 F (36.4 C)-98.9 F (37.2 C)] 97.8 F (36.6 C) (11/03 0629) Pulse Rate:  [71-82] 71 (11/03 0629) Resp:  [18] 18 (11/03 0629) BP: (147-165)/(58-78) 165/58 mmHg (11/03 0629) SpO2:  [100 %] 100 % (11/03 0629) Weight change:  Last BM Date: 08/08/15  Intake/Output from previous day: 11/02 0701 - 11/03 0700 In: 2560 [P.O.:1360; I.V.:1200] Out: 6 [Urine:3; Stool:3]  PHYSICAL EXAM General appearance: alert and Confused Resp: clear to auscultation bilaterally Cardio: regular rate and rhythm, S1, S2 normal, no murmur, click, rub or gallop GI: soft, non-tender; bowel sounds normal; no masses,  no organomegaly Extremities: extremities normal, atraumatic, no cyanosis or edema  Lab Results:  Results for orders placed or performed during the hospital encounter of 08/07/15 (from the past 48 hour(s))  CBC with Differential     Status: Abnormal   Collection Time: 08/07/15 12:30 PM  Result Value Ref Range   WBC 6.6 4.0 - 10.5 K/uL   RBC 3.33 (L) 3.87 - 5.11 MIL/uL   Hemoglobin 10.3 (L) 12.0 - 15.0 g/dL   HCT 30.1 (L) 36.0 - 46.0 %   MCV 90.4 78.0 - 100.0 fL   MCH 30.9 26.0 - 34.0 pg   MCHC 34.2 30.0 - 36.0 g/dL   RDW 13.8 11.5 - 15.5 %   Platelets 127 (L) 150 - 400 K/uL   Neutrophils Relative % 83 %   Neutro Abs 5.4 1.7 - 7.7 K/uL   Lymphocytes Relative 9 %   Lymphs Abs 0.6 (L) 0.7 - 4.0 K/uL   Monocytes Relative 7 %   Monocytes Absolute 0.5 0.1 - 1.0 K/uL   Eosinophils Relative 1 %   Eosinophils Absolute 0.0 0.0 - 0.7 K/uL   Basophils Relative 0 %   Basophils Absolute 0.0 0.0 - 0.1 K/uL  Basic metabolic panel     Status: Abnormal   Collection Time: 08/07/15 12:30 PM  Result Value Ref Range   Sodium 139 135 - 145 mmol/L   Potassium 3.2 (L) 3.5 - 5.1 mmol/L   Chloride 103 101 - 111 mmol/L   CO2 25 22 - 32 mmol/L   Glucose,  Bld 189 (H) 65 - 99 mg/dL   BUN 8 6 - 20 mg/dL   Creatinine, Ser 0.63 0.44 - 1.00 mg/dL   Calcium 8.3 (L) 8.9 - 10.3 mg/dL   GFR calc non Af Amer >60 >60 mL/min   GFR calc Af Amer >60 >60 mL/min    Comment: (NOTE) The eGFR has been calculated using the CKD EPI equation. This calculation has not been validated in all clinical situations. eGFR's persistently <60 mL/min signify possible Chronic Kidney Disease.    Anion gap 11 5 - 15  Troponin I     Status: None   Collection Time: 08/07/15 12:30 PM  Result Value Ref Range   Troponin I <0.03 <0.031 ng/mL    Comment:        NO INDICATION OF MYOCARDIAL INJURY.   TSH     Status: Abnormal   Collection Time: 08/07/15 12:30 PM  Result Value Ref Range   TSH 11.367 (H) 0.350 - 4.500 uIU/mL  Urinalysis, Routine w reflex microscopic (not at [facility])     Status: Abnormal   Collection Time: 08/07/15 12:35 PM  Result Value Ref Range   Color, Urine YELLOW YELLOW   APPearance CLEAR CLEAR  Specific Gravity, Urine 1.020 1.005 - 1.030   pH 6.5 5.0 - 8.0   Glucose, UA NEGATIVE NEGATIVE mg/dL   Hgb urine dipstick TRACE (A) NEGATIVE   Bilirubin Urine NEGATIVE NEGATIVE   Ketones, ur NEGATIVE NEGATIVE mg/dL   Protein, ur NEGATIVE NEGATIVE mg/dL   Urobilinogen, UA 0.2 0.0 - 1.0 mg/dL   Nitrite NEGATIVE NEGATIVE   Leukocytes, UA SMALL (A) NEGATIVE  Urine culture     Status: None   Collection Time: 08/07/15 12:35 PM  Result Value Ref Range   Specimen Description URINE, CATHETERIZED    Special Requests NONE    Culture      >=100,000 COLONIES/mL KLEBSIELLA SPECIES Performed at [facility]    Report Status 08/09/2015 FINAL    Organism ID, Bacteria KLEBSIELLA SPECIES       Susceptibility   Klebsiella species - MIC*    AMPICILLIN >=32 RESISTANT Resistant     CEFAZOLIN <=4 SENSITIVE Sensitive     CEFTRIAXONE <=1 SENSITIVE Sensitive     CIPROFLOXACIN <=0.25 SENSITIVE Sensitive     GENTAMICIN <=1 SENSITIVE Sensitive     IMIPENEM <=0.25  SENSITIVE Sensitive     NITROFURANTOIN 64 INTERMEDIATE Intermediate     TRIMETH/SULFA <=20 SENSITIVE Sensitive     AMPICILLIN/SULBACTAM 4 SENSITIVE Sensitive     PIP/TAZO <=4 SENSITIVE Sensitive     * >=100,000 COLONIES/mL KLEBSIELLA SPECIES  Prealbumin     Status: Abnormal   Collection Time: 08/07/15 12:35 PM  Result Value Ref Range   Prealbumin 17.9 (L) 18 - 38 mg/dL    Comment: Performed at [facility]  Urine microscopic-add on     Status: None   Collection Time: 08/07/15 12:35 PM  Result Value Ref Range   Squamous Epithelial / LPF RARE RARE   WBC, UA 3-6 <3 WBC/hpf   RBC / HPF 0-2 <3 RBC/hpf  Acetaminophen level     Status: Abnormal   Collection Time: 08/07/15 12:38 PM  Result Value Ref Range   Acetaminophen (Tylenol), Serum <10 (L) 10 - 30 ug/mL    Comment:        THERAPEUTIC CONCENTRATIONS VARY SIGNIFICANTLY. A RANGE OF 10-30 ug/mL MAY BE AN EFFECTIVE CONCENTRATION FOR MANY PATIENTS. HOWEVER, SOME ARE BEST TREATED AT CONCENTRATIONS OUTSIDE THIS RANGE. ACETAMINOPHEN CONCENTRATIONS >150 ug/mL AT 4 HOURS AFTER INGESTION AND >50 ug/mL AT 12 HOURS AFTER INGESTION ARE OFTEN ASSOCIATED WITH TOXIC REACTIONS.   Brain natriuretic peptide     Status: None   Collection Time: 08/07/15 12:38 PM  Result Value Ref Range   B Natriuretic Peptide 55.0 0.0 - 100.0 pg/mL  Lactic acid, plasma     Status: Abnormal   Collection Time: 08/07/15 12:58 PM  Result Value Ref Range   Lactic Acid, Venous 3.0 (HH) 0.5 - 2.0 mmol/L    Comment: CRITICAL RESULT CALLED TO, READ BACK BY AND VERIFIED WITH: YOUNG, J AT 1334 ON 08/07/2015 BY AGUNDIZ, E.   POC occult blood, ED     Status: Abnormal   Collection Time: 08/07/15  2:22 PM  Result Value Ref Range   Fecal Occult Bld POSITIVE (A) NEGATIVE  Lactic acid, plasma     Status: Abnormal   Collection Time: 08/07/15  3:33 PM  Result Value Ref Range   Lactic Acid, Venous 2.2 (HH) 0.5 - 2.0 mmol/L    Comment: CRITICAL RESULT CALLED TO, READ  BACK BY AND VERIFIED WITH: VOGLER,T ON 08/07/15 AT 1600 BY LOY,C   T4, free  Status: None   Collection Time: 08/07/15  7:20 PM  Result Value Ref Range   Free T4 0.94 0.61 - 1.12 ng/dL    Comment: Performed at [facility]  CBC     Status: Abnormal   Collection Time: 08/07/15  7:40 PM  Result Value Ref Range   WBC 6.3 4.0 - 10.5 K/uL   RBC 3.40 (L) 3.87 - 5.11 MIL/uL   Hemoglobin 10.5 (L) 12.0 - 15.0 g/dL   HCT 30.4 (L) 36.0 - 46.0 %   MCV 89.4 78.0 - 100.0 fL   MCH 30.9 26.0 - 34.0 pg   MCHC 34.5 30.0 - 36.0 g/dL   RDW 13.6 11.5 - 15.5 %   Platelets 140 (L) 150 - 400 K/uL  Protime-INR     Status: None   Collection Time: 08/07/15  7:40 PM  Result Value Ref Range   Prothrombin Time 15.2 11.6 - 15.2 seconds   INR 1.18 0.00 - 1.49  MRSA PCR Screening     Status: Abnormal   Collection Time: 08/07/15 11:25 PM  Result Value Ref Range   MRSA by PCR POSITIVE (A) NEGATIVE    Comment:        The GeneXpert MRSA Assay (FDA approved for NASAL specimens only), is one component of a comprehensive MRSA colonization surveillance program. It is not intended to diagnose MRSA infection nor to guide or monitor treatment for MRSA infections. RESULT CALLED TO, READ BACK BY AND VERIFIED WITH: COVINGTON L AT 0143 ON [id number] BY FORSYTH K   CBC     Status: Abnormal   Collection Time: 08/08/15  2:05 AM  Result Value Ref Range   WBC 5.6 4.0 - 10.5 K/uL   RBC 3.30 (L) 3.87 - 5.11 MIL/uL   Hemoglobin 10.1 (L) 12.0 - 15.0 g/dL   HCT 29.6 (L) 36.0 - 46.0 %   MCV 89.7 78.0 - 100.0 fL   MCH 30.6 26.0 - 34.0 pg   MCHC 34.1 30.0 - 36.0 g/dL   RDW 13.8 11.5 - 15.5 %   Platelets 143 (L) 150 - 400 K/uL  Comprehensive metabolic panel     Status: Abnormal   Collection Time: 08/08/15  7:33 AM  Result Value Ref Range   Sodium 143 135 - 145 mmol/L   Potassium 2.8 (L) 3.5 - 5.1 mmol/L   Chloride 108 101 - 111 mmol/L   CO2 25 22 - 32 mmol/L   Glucose, Bld 120 (H) 65 - 99 mg/dL   BUN 5 (L) 6 -  20 mg/dL   Creatinine, Ser 0.60 0.44 - 1.00 mg/dL   Calcium 7.6 (L) 8.9 - 10.3 mg/dL   Total Protein 5.6 (L) 6.5 - 8.1 g/dL   Albumin 3.5 3.5 - 5.0 g/dL   AST 25 15 - 41 U/L   ALT 14 14 - 54 U/L   Alkaline Phosphatase 51 38 - 126 U/L   Total Bilirubin 1.0 0.3 - 1.2 mg/dL   GFR calc non Af Amer >60 >60 mL/min   GFR calc Af Amer >60 >60 mL/min    Comment: (NOTE) The eGFR has been calculated using the CKD EPI equation. This calculation has not been validated in all clinical situations. eGFR's persistently <60 mL/min signify possible Chronic Kidney Disease.    Anion gap 10 5 - 15  CBC     Status: Abnormal   Collection Time: 08/08/15  7:33 AM  Result Value Ref Range   WBC 4.9 4.0 - 10.5 K/uL  RBC 3.31 (L) 3.87 - 5.11 MIL/uL   Hemoglobin 10.2 (L) 12.0 - 15.0 g/dL   HCT 29.7 (L) 36.0 - 46.0 %   MCV 89.7 78.0 - 100.0 fL   MCH 30.8 26.0 - 34.0 pg   MCHC 34.3 30.0 - 36.0 g/dL   RDW 13.8 11.5 - 15.5 %   Platelets 139 (L) 150 - 400 K/uL  Lactic acid, plasma     Status: None   Collection Time: 08/08/15  7:33 AM  Result Value Ref Range   Lactic Acid, Venous 0.9 0.5 - 2.0 mmol/L  C difficile quick scan w PCR reflex     Status: None   Collection Time: 08/08/15  9:45 AM  Result Value Ref Range   C Diff antigen NEGATIVE NEGATIVE   C Diff toxin NEGATIVE NEGATIVE   C Diff interpretation Negative for toxigenic C. difficile   CBC     Status: Abnormal   Collection Time: 08/08/15  1:45 PM  Result Value Ref Range   WBC 6.5 4.0 - 10.5 K/uL   RBC 3.36 (L) 3.87 - 5.11 MIL/uL   Hemoglobin 10.3 (L) 12.0 - 15.0 g/dL   HCT 30.3 (L) 36.0 - 46.0 %   MCV 90.2 78.0 - 100.0 fL   MCH 30.7 26.0 - 34.0 pg   MCHC 34.0 30.0 - 36.0 g/dL   RDW 13.9 11.5 - 15.5 %   Platelets 146 (L) 150 - 400 K/uL  CBC     Status: Abnormal   Collection Time: 08/08/15  7:50 PM  Result Value Ref Range   WBC 5.6 4.0 - 10.5 K/uL   RBC 3.05 (L) 3.87 - 5.11 MIL/uL   Hemoglobin 9.5 (L) 12.0 - 15.0 g/dL   HCT 27.3 (L) 36.0 -  46.0 %   MCV 89.5 78.0 - 100.0 fL   MCH 31.1 26.0 - 34.0 pg   MCHC 34.8 30.0 - 36.0 g/dL   RDW 13.9 11.5 - 15.5 %   Platelets 141 (L) 150 - 400 K/uL  CBC with Differential/Platelet     Status: Abnormal   Collection Time: 08/09/15  5:45 AM  Result Value Ref Range   WBC 5.1 4.0 - 10.5 K/uL   RBC 3.37 (L) 3.87 - 5.11 MIL/uL   Hemoglobin 10.2 (L) 12.0 - 15.0 g/dL   HCT 30.4 (L) 36.0 - 46.0 %   MCV 90.2 78.0 - 100.0 fL   MCH 30.3 26.0 - 34.0 pg   MCHC 33.6 30.0 - 36.0 g/dL   RDW 13.8 11.5 - 15.5 %   Platelets 142 (L) 150 - 400 K/uL   Neutrophils Relative % 66 %   Neutro Abs 3.5 1.7 - 7.7 K/uL   Lymphocytes Relative 21 %   Lymphs Abs 1.1 0.7 - 4.0 K/uL   Monocytes Relative 9 %   Monocytes Absolute 0.5 0.1 - 1.0 K/uL   Eosinophils Relative 3 %   Eosinophils Absolute 0.1 0.0 - 0.7 K/uL   Basophils Relative 1 %   Basophils Absolute 0.0 0.0 - 0.1 K/uL  Basic metabolic panel     Status: Abnormal   Collection Time: 08/09/15  5:45 AM  Result Value Ref Range   Sodium 146 (H) 135 - 145 mmol/L   Potassium 2.5 (LL) 3.5 - 5.1 mmol/L    Comment: CRITICAL RESULT CALLED TO, READ BACK BY AND VERIFIED WITH: KNIGHT,C AT 7:15AM ON 08/09/15 BY FESTERMAN,C    Chloride 111 101 - 111 mmol/L   CO2 27 22 - 32 mmol/L  Glucose, Bld 118 (H) 65 - 99 mg/dL   BUN <5 (L) 6 - 20 mg/dL   Creatinine, Ser 0.62 0.44 - 1.00 mg/dL   Calcium 7.3 (L) 8.9 - 10.3 mg/dL   GFR calc non Af Amer >60 >60 mL/min   GFR calc Af Amer >60 >60 mL/min    Comment: (NOTE) The eGFR has been calculated using the CKD EPI equation. This calculation has not been validated in all clinical situations. eGFR's persistently <60 mL/min signify possible Chronic Kidney Disease.    Anion gap 8 5 - 15    ABGS No results for input(s): PHART, PO2ART, TCO2, HCO3 in the last 72 hours.  Invalid input(s): PCO2 CULTURES Recent Results (from the past 240 hour(s))  Urine culture     Status: None   Collection Time: 08/04/15  3:24 PM  Result  Value Ref Range Status   Specimen Description URINE, CLEAN CATCH  Final   Special Requests NONE  Final   Culture   Final    1,000 COLONIES/mL INSIGNIFICANT GROWTH Performed at [facility]    Report Status 08/06/2015 FINAL  Final  Urine culture     Status: None   Collection Time: 08/07/15 12:35 PM  Result Value Ref Range Status   Specimen Description URINE, CATHETERIZED  Final   Special Requests NONE  Final   Culture   Final    >=100,000 COLONIES/mL KLEBSIELLA SPECIES Performed at [facility]    Report Status 08/09/2015 FINAL  Final   Organism ID, Bacteria KLEBSIELLA SPECIES  Final      Susceptibility   Klebsiella species - MIC*    AMPICILLIN >=32 RESISTANT Resistant     CEFAZOLIN <=4 SENSITIVE Sensitive     CEFTRIAXONE <=1 SENSITIVE Sensitive     CIPROFLOXACIN <=0.25 SENSITIVE Sensitive     GENTAMICIN <=1 SENSITIVE Sensitive     IMIPENEM <=0.25 SENSITIVE Sensitive     NITROFURANTOIN 64 INTERMEDIATE Intermediate     TRIMETH/SULFA <=20 SENSITIVE Sensitive     AMPICILLIN/SULBACTAM 4 SENSITIVE Sensitive     PIP/TAZO <=4 SENSITIVE Sensitive     * >=100,000 COLONIES/mL KLEBSIELLA SPECIES  MRSA PCR Screening     Status: Abnormal   Collection Time: 08/07/15 11:25 PM  Result Value Ref Range Status   MRSA by PCR POSITIVE (A) NEGATIVE Final    Comment:        The GeneXpert MRSA Assay (FDA approved for NASAL specimens only), is one component of a comprehensive MRSA colonization surveillance program. It is not intended to diagnose MRSA infection nor to guide or monitor treatment for MRSA infections. RESULT CALLED TO, READ BACK BY AND VERIFIED WITH: COVINGTON L AT 0143 ON [id number] BY FORSYTH K   C difficile quick scan w PCR reflex     Status: None   Collection Time: 08/08/15  9:45 AM  Result Value Ref Range Status   C Diff antigen NEGATIVE NEGATIVE Final   C Diff toxin NEGATIVE NEGATIVE Final   C Diff interpretation Negative for toxigenic C. difficile  Final    Studies/Results: Ct Head Wo Contrast  08/07/2015  CLINICAL DATA:  Increased confusion at night.  Dementia. EXAM: CT HEAD WITHOUT CONTRAST TECHNIQUE: Contiguous axial images were obtained from the base of the skull through the vertex without intravenous contrast. COMPARISON:  08/04/2015 FINDINGS: There is atrophy and chronic small vessel disease changes. No acute intracranial abnormality. Specifically, no hemorrhage, hydrocephalus, mass lesion, acute infarction, or significant intracranial injury. No acute calvarial abnormality. Again noted is  the fracture the right mandibular condyle, stable. IMPRESSION: No acute intracranial abnormality. Atrophy, chronic microvascular disease. Electronically Signed   By: Rolm Baptise M.D.   On: 08/07/2015 15:29   Ct Abdomen Pelvis W Contrast  08/07/2015  CLINICAL DATA:  Abdominal pain, blood in stool, increased confusion at night, back pain, nausea, personal history of dementia, at diabetes mellitus, thyroid cancer EXAM: CT ABDOMEN AND PELVIS WITH CONTRAST TECHNIQUE: Multidetector CT imaging of the abdomen and pelvis was performed using the standard protocol following bolus administration of intravenous contrast. Sagittal and coronal MPR images reconstructed from axial data set. CONTRAST:  4m OMNIPAQUE IOHEXOL 300 MG/ML SOLN IV. Dilute oral contrast. COMPARISON:  11/18/2014 FINDINGS: Minimal bibasilar atelectasis. Mitral annular calcification. Scattered atherosclerotic calcifications. Retro aortic LEFT renal vein. Post cholecystectomy and appendectomy by history. Tiny RIGHT renal cyst. Minimally nodular medial limb RIGHT adrenal gland, stable. Liver, spleen, atrophic pancreas, kidneys, and adrenal glands otherwise normal. Unremarkable bladder, ureters, uterus and adnexa. Wall thickening of rectum and distal sigmoid colon questioned. Stomach and bowel loops otherwise normal appearance. Tiny umbilical hernia containing fat. No mass, adenopathy, free air or free fluid.  Numerous pelvic phleboliths. Multifactorial acquired spinal stenosis L4-L5 without acute osseous findings. IMPRESSION: Questionable wall thickening of the rectum and distal sigmoid colon, though this could be potentially related to artifact from underdistention. Endoscopy recommended to exclude distal colitis/proctitis. Multifactorial spinal stenosis L4-L5. Otherwise negative exam. Electronically Signed   By: MLavonia DanaM.D.   On: 08/07/2015 15:36   Dg Chest Port 1 View  08/07/2015  CLINICAL DATA:  Confusion. EXAM: PORTABLE CHEST 1 VIEW COMPARISON:  08/04/2015 FINDINGS: The heart size and mediastinal contours are within normal limits. Stable mild elevation of the right hemidiaphragm. There is no evidence of pulmonary edema, consolidation, pneumothorax, nodule or pleural fluid. The visualized skeletal structures are unremarkable. IMPRESSION: No active disease. Electronically Signed   By: GAletta EdouardM.D.   On: 08/07/2015 13:32    Medications:  Prior to Admission:  Prescriptions prior to admission  Medication Sig Dispense Refill Last Dose  . acetaminophen (TYLENOL) 325 MG tablet Take 650 mg by mouth every 4 (four) hours as needed for pain.   08/07/2015 at Unknown time  . cyanocobalamin 100 MCG tablet Take 100 mcg by mouth daily.   08/07/2015 at Unknown time  . ferrous sulfate 325 (65 FE) MG tablet Take 325 mg by mouth daily with breakfast.   08/07/2015 at Unknown time  . lansoprazole (PREVACID) 30 MG capsule Take 30 mg by mouth daily at 12 noon.   08/07/2015 at Unknown time  . levothyroxine (SYNTHROID, LEVOTHROID) 100 MCG tablet Take 100 mcg by mouth daily before breakfast.   08/07/2015 at Unknown time  . metFORMIN (GLUCOPHAGE-XR) 500 MG 24 hr tablet Take 500 mg by mouth daily with breakfast.    08/07/2015 at Unknown time  . Multiple Vitamin (MULTIVITAMIN) tablet Take 1 tablet by mouth daily.   08/07/2015 at Unknown time   Scheduled: . Chlorhexidine Gluconate Cloth  6 each Topical Q0600  . ferrous  sulfate  325 mg Oral Q breakfast  . levothyroxine  100 mcg Oral QAC breakfast  . mupirocin ointment  1 application Nasal BID  . pantoprazole (PROTONIX) IV  40 mg Intravenous Q12H  . potassium chloride  40 mEq Oral BID  . sodium chloride  3 mL Intravenous Q12H   Continuous: . sodium chloride 100 mL/hr at 08/09/15 0854   PYTK:[id number]**OR** acetaminophen, ondansetron **OR** ondansetron (ZOFRAN) IV  Assesment: She was  admitted with acute GI bleeding. This is from one of many problems. She does not want any further workup. She has been acutely encephalopathic but has dementia at baseline and I think she is slightly better. Her potassium level is low today. Principal Problem:   GI bleed Active Problems:   Chronic gastrointestinal bleeding   Diabetes mellitus type 2 in nonobese (HCC)   Hypothyroidism   Iron deficiency anemia   History of fall   Mandibular fracture, closed ([place])   Acute encephalopathy   Hematochezia    Plan: Replace potassium. She may be able to be discharged to assisted living facility tomorrow    LOS: 2 days   Daija Routson L 08/09/2015, 9:04 AM

## 2015-08-09 NOTE — Progress Notes (Signed)
Physical Therapy Treatment Patient Details Name: Kara Santos MRN: [id number] DOB: 1917-03-18 Today's Date: 08/09/2015    History of Present Illness Kara Santos is a 81 y.o. female with Past medical history of AV malformation of the stomach, chronic GI bleeding, chronic iron deficiency anemia, diabetes mellitus, hypothyroidism, dementia, recurrent fall. The patient is presenting with complaints of confusion which has been ongoing for last few weeks progressively worsening, as well as generalized weakness and fatigue. Unsteady gait noted yesterday. Upon admission the patient denies HA, dizziness, angina, abd pain, nausea, burning urination. In October, pt on abx for UTI. Patient was recently seen in the ER for a fall and was found to be having mandibular fracture which is currently being managed conservatively. The patient is coming from home. At her baseline ambulates with walker. And is dependent for most of her ADL, but does not manages her medication on her own.    PT Comments    Pt alert and converting with family member in semirecumbent position in bed upon therapist entrance.  Pt willing to participate with therapy today.  Pt demonstrates decreased hearing and short term memory.  Session focus on safety awareness with transfers and gait.  Verbal cueing for proper hand placement prior sit to standing from bed and toilet.  Pt balance steady with ability to wash hands with no LOB episodes.  Increased distance with gait training with ability to demonstrate gait mechanics WNL, min cueing to improve posture able to demonstrate appropriate stride length with gait.  End of session pt left in chair with call bell within reach and chair alarm set.  No reports of pain through session.     Follow Up Recommendations        Equipment Recommendations       Recommendations for Other Services       Precautions / Restrictions Precautions Precautions: None Restrictions Weight Bearing Restrictions: No     Mobility  Bed Mobility Overal bed mobility: Independent Bed Mobility: Supine to Sit     Supine to sit: Supervision        Transfers Overall transfer level: Needs assistance Equipment used: Rolling walker (2 wheeled) Transfers: Sit to/from Stand Sit to Stand: Supervision         General transfer comment: Cueing for hand placement prior sit to stand  Ambulation/Gait Ambulation/Gait assistance: Supervision Ambulation Distance (Feet): 200 Feet Assistive device: Rolling walker (2 wheeled) Gait Pattern/deviations: WFL(Within Functional Limits)   Gait velocity interpretation: at or above normal speed for age/gender     Stairs            Wheelchair Mobility    Modified Rankin (Stroke Patients Only)       Balance                                    Cognition Arousal/Alertness: Awake/alert Behavior During Therapy: WFL for tasks assessed/performed Overall Cognitive Status: No family/caregiver present to determine baseline cognitive functioning       Memory: Decreased short-term memory              Exercises      General Comments        Pertinent Vitals/Pain Pain Assessment: No/denies pain    Home Living                      Prior Function  PT Goals (current goals can now be found in the care plan section) Progress towards PT goals: Progressing toward goals    Frequency       PT Plan Current plan remains appropriate    Co-evaluation             End of Session Equipment Utilized During Treatment: Gait belt Activity Tolerance: Patient tolerated treatment well Patient left: in chair;with call bell/phone within reach;with chair alarm set;with family/visitor present     Time: 1130-1153 PT Time Calculation (min) (ACUTE ONLY): 23 min  Charges:  $Gait Training: 8-22 mins $Therapeutic Activity: 8-22 mins                    G Codes:      Aldona Lento 08/09/2015, 2:36 PM

## 2015-08-09 NOTE — Clinical Social Work Placement (Signed)
   CLINICAL SOCIAL WORK PLACEMENT  NOTE  Date:  08/09/2015  Patient Details  Name: Kara Santos MRN: [id number] Date of Birth: 26-Oct-1916  Clinical Social Work is seeking post-discharge placement for this patient at the [place] level of care (*CSW will initial, date and re-position this form in  chart as items are completed):  Yes   Patient/family provided with [facility] Work Department's list of facilities offering this level of care within the geographic area requested by the patient (or if unable, by the patient's family).  Yes   Patient/family informed of their freedom to choose among providers that offer the needed level of care, that participate in Medicare, Medicaid or managed care program needed by the patient, have an available bed and are willing to accept the patient.  Yes   Patient/family informed of [facility]'s ownership interest in [facility] and [facility], as well as of the fact that they are under no obligation to receive care at these facilities.  PASRR submitted to EDS on       PASRR number received on       Existing PASRR number confirmed on       FL2 transmitted to all facilities in geographic area requested by pt/family on       FL2 transmitted to all facilities within larger geographic area on       Patient informed that his/her managed care company has contracts with or will negotiate with certain facilities, including the following:        Yes   Patient/family informed of bed offers received.  Patient chooses bed at Other - please specify in the comment section below:     Physician recommends and patient chooses bed at      Patient to be transferred to   on  .  Patient to be transferred to facility by       Patient family notified on   of transfer.  Name of family member notified:        PHYSICIAN       Additional Comment:  [facility]  _______________________________________________ Salome Arnt, LCSW 08/09/2015, 12:53 PM [phone]

## 2015-08-10 LAB — CBC WITH DIFFERENTIAL/PLATELET
BASOS ABS: 0 10*3/uL (ref 0.0–0.1)
BASOS PCT: 0 %
Eosinophils Absolute: 0.2 10*3/uL (ref 0.0–0.7)
Eosinophils Relative: 3 %
HEMATOCRIT: 29 % — AB (ref 36.0–46.0)
HEMOGLOBIN: 9.9 g/dL — AB (ref 12.0–15.0)
Lymphocytes Relative: 19 %
Lymphs Abs: 0.9 10*3/uL (ref 0.7–4.0)
MCH: 30.8 pg (ref 26.0–34.0)
MCHC: 34.1 g/dL (ref 30.0–36.0)
MCV: 90.3 fL (ref 78.0–100.0)
Monocytes Absolute: 0.5 10*3/uL (ref 0.1–1.0)
Monocytes Relative: 10 %
NEUTROS ABS: 3.4 10*3/uL (ref 1.7–7.7)
NEUTROS PCT: 68 %
Platelets: 146 10*3/uL — ABNORMAL LOW (ref 150–400)
RBC: 3.21 MIL/uL — ABNORMAL LOW (ref 3.87–5.11)
RDW: 13.8 % (ref 11.5–15.5)
WBC: 5 10*3/uL (ref 4.0–10.5)

## 2015-08-10 LAB — BASIC METABOLIC PANEL
ANION GAP: 6 (ref 5–15)
BUN: <5 mg/dL — ABNORMAL LOW (ref 6–20)
CALCIUM: 7.1 mg/dL — AB (ref 8.9–10.3)
CO2: 27 mmol/L (ref 22–32)
Chloride: 113 mmol/L — ABNORMAL HIGH (ref 101–111)
Creatinine, Ser: 0.56 mg/dL (ref 0.44–1.00)
Glucose, Bld: 112 mg/dL — ABNORMAL HIGH (ref 65–99)
POTASSIUM: 3.2 mmol/L — AB (ref 3.5–5.1)
Sodium: 146 mmol/L — ABNORMAL HIGH (ref 135–145)

## 2015-08-10 MED ORDER — POTASSIUM CHLORIDE CRYS ER 20 MEQ PO TBCR
40.0000 meq | EXTENDED_RELEASE_TABLET | Freq: Two times a day (BID) | ORAL | Status: DC
  Administered 2015-08-10: 40 meq via ORAL
  Filled 2015-08-10: qty 2

## 2015-08-10 MED ORDER — AMOXICILLIN 250 MG PO CAPS: 250.0000 mg | ORAL_CAPSULE | Freq: Three times a day (TID) | ORAL | Status: DC

## 2015-08-10 NOTE — NC FL2 (Signed)
[place] MEDICAID FL2 LEVEL OF CARE SCREENING TOOL     IDENTIFICATION  Patient Name: Kara Santos Birthdate: 1917-02-12 Sex: female Admission Date (Current Location): 08/07/2015  [facility] and [place] Number: [employer] and Address:  [place] [address], [place]      Provider Number: [id number]  Attending Physician Name and Address:  Sinda Du, MD  Relative Name and Phone Number:       Current Level of Care: Hospital Recommended Level of Care: [place], Other (Comment) (Long term care) Prior Approval Number:    Date Approved/Denied:   PASRR Number:    Discharge Plan: Other (Comment) (ALF/Long Term Care)    Current Diagnoses: Patient Active Problem List   Diagnosis Date Noted  . GI bleed 08/07/2015  . Mandibular fracture, closed ([place]) 08/07/2015  . Acute encephalopathy 08/07/2015  . Hematochezia 08/07/2015  . Fall 11/20/2014  . Multiple rib fractures 11/17/2014  . AVM (arteriovenous malformation) of stomach, acquired with hemorrhage 12/04/2013  . Closed fracture of greater trochanter of femur ([place]) 02/06/2013  . History of fall 02/06/2013  . Iron deficiency anemia 05/05/2012  . Chronic gastrointestinal bleeding 03/04/2012  . Diabetes mellitus type 2 in nonobese ([place]) 03/04/2012  . Hypothyroidism 03/04/2012  . History of thyroid cancer 03/04/2012    Orientation ACTIVITIES/SOCIAL BLADDER RESPIRATION    Self, Place  Family supportive Continent Normal  BEHAVIORAL SYMPTOMS/MOOD NEUROLOGICAL BOWEL NUTRITION STATUS      Continent Diet (Soft diet)  PHYSICIAN VISITS COMMUNICATION OF NEEDS Height & Weight Skin    Verbally 4\' 11"  (149.9 cm) 97 lbs. Other (Comment) (Abrasion to face. Generalized bruising.)          AMBULATORY STATUS RESPIRATION    Supervision limited Normal      Personal Care Assistance Level of Assistance  Bathing, Feeding, Dressing Bathing Assistance: Limited assistance Feeding  assistance: Limited assistance Dressing Assistance: Limited assistance      Functional Limitations Info  Hearing   Hearing Info: Impaired         SPECIAL CARE FACTORS FREQUENCY                      Additional Factors Info  Code Status, Isolation Precautions Code Status Info: DNR Allergies Info: Asa (Aspirin)     Isolation Precautions Info: 08/07/15 mrsa by pcr     Current Medications (08/10/2015): Current Facility-Administered Medications  Medication Dose Route Frequency Provider Last Rate Last Dose  . 0.9 %  sodium chloride infusion   Intravenous Continuous Lavina Hamman, MD 100 mL/hr at 08/09/15 0854    . acetaminophen (TYLENOL) tablet 650 mg  650 mg Oral Q6H PRN Lavina Hamman, MD       Or  . acetaminophen (TYLENOL) suppository 650 mg  650 mg Rectal Q6H PRN Lavina Hamman, MD      . Chlorhexidine Gluconate Cloth 2 % PADS 6 each  6 each Topical Q0600 Sinda Du, MD   6 each at 08/10/15 0545  . ferrous sulfate tablet 325 mg  325 mg Oral Q breakfast Lavina Hamman, MD   325 mg at 08/10/15 0901  . levothyroxine (SYNTHROID, LEVOTHROID) tablet 100 mcg  100 mcg Oral QAC breakfast Lavina Hamman, MD   100 mcg at 08/10/15 0901  . mupirocin ointment (BACTROBAN) 2 % 1 application  1 application Nasal BID Sinda Du, MD   1 application at 29/52/84 0902  . ondansetron (ZOFRAN) tablet 4 mg  4 mg Oral Q6H PRN Lavina Hamman, MD       Or  . ondansetron [facility]) injection 4 mg  4 mg Intravenous Q6H PRN Lavina Hamman, MD      . pantoprazole (PROTONIX) injection 40 mg  40 mg Intravenous Q12H Lavina Hamman, MD   40 mg at 08/10/15 0901  . potassium chloride SA (K-DUR,KLOR-CON) CR tablet 40 mEq  40 mEq Oral BID Sinda Du, MD   40 mEq at 08/10/15 0902  . sodium chloride 0.9 % injection 3 mL  3 mL Intravenous Q12H Lavina Hamman, MD   3 mL at 08/10/15 0902   Do not use this list as official medication orders. Please verify with discharge summary.  Discharge Medications:    Medication List    TAKE these medications        acetaminophen 325 MG tablet  Commonly known as:  TYLENOL  Take 650 mg by mouth every 4 (four) hours as needed for pain.     amoxicillin 250 MG capsule  Commonly known as:  AMOXIL  Take 1 capsule (250 mg total) by mouth 3 (three) times daily.     cyanocobalamin 100 MCG tablet  Take 100 mcg by mouth daily.     ferrous sulfate 325 (65 FE) MG tablet  Take 325 mg by mouth daily with breakfast.     lansoprazole 30 MG capsule  Commonly known as:  PREVACID  Take 30 mg by mouth daily at 12 noon.     levothyroxine 100 MCG tablet  Commonly known as:  SYNTHROID, LEVOTHROID  Take 100 mcg by mouth daily before breakfast.     metFORMIN 500 MG 24 hr tablet  Commonly known as:  GLUCOPHAGE-XR  Take 500 mg by mouth daily with breakfast.     multivitamin tablet  Take 1 tablet by mouth daily.        Relevant Imaging Results:  Relevant Lab Results:  Recent Labs    Additional Information  (MRSA, contact and enteric precautions)  Salome Arnt, [place]

## 2015-08-10 NOTE — Progress Notes (Signed)
She feels better. Hemoglobin level is pending. Assuming the hemoglobin level is okay and she can be transferred to assisted living facility later today.

## 2015-08-10 NOTE — Clinical Social Work Placement (Signed)
   CLINICAL SOCIAL WORK PLACEMENT  NOTE  Date:  08/10/2015  Patient Details  Name: Kara Santos MRN: [id number] Date of Birth: 02-Mar-1917  Clinical Social Work is seeking post-discharge placement for this patient at the [place] level of care (*CSW will initial, date and re-position this form in  chart as items are completed):  Yes   Patient/family provided with [facility] Work Department's list of facilities offering this level of care within the geographic area requested by the patient (or if unable, by the patient's family).  Yes   Patient/family informed of their freedom to choose among providers that offer the needed level of care, that participate in Medicare, Medicaid or managed care program needed by the patient, have an available bed and are willing to accept the patient.  Yes   Patient/family informed of [facility]'s ownership interest in [facility] and [facility], as well as of the fact that they are under no obligation to receive care at these facilities.  PASRR submitted to EDS on       PASRR number received on       Existing PASRR number confirmed on       FL2 transmitted to all facilities in geographic area requested by pt/family on 08/09/15     FL2 transmitted to all facilities within larger geographic area on       Patient informed that his/her managed care company has contracts with or will negotiate with certain facilities, including the following:        Yes   Patient/family informed of bed offers received.  Patient chooses bed at Other - please specify in the comment section below:     Physician recommends and patient chooses bed at      Patient to be transferred to Other - please specify in the comment section below: on 08/10/15.  Patient to be transferred to facility by [name]     Patient family notified on 08/10/15 of transfer.  Name of family member notified:  [name]- daughter     PHYSICIAN        Additional Comment:  [facility] [place]  _______________________________________________ Salome Arnt, [place] 08/10/2015, 12:00 PM [phone]

## 2015-08-10 NOTE — Progress Notes (Signed)
Patient discharged to [facility], [place].  IV removed - WNL.  Packet from SW sent with patient to facility, given to daughter.  Patient in stable condition at this time, assisted off unit via WC by NT.

## 2015-08-10 NOTE — Discharge Summary (Addendum)
Physician Discharge Summary  Patient ID: Kara Santos MRN: [id number] DOB/AGE: 1917-03-07 81 y.o. Primary Care Physician:Kara Accardi L, MD Admit date: 08/07/2015 Discharge date: 08/10/2015    Discharge Diagnoses:   Principal Problem:   GI bleed Active Problems:   Chronic gastrointestinal bleeding   Diabetes mellitus type 2 in nonobese (HCC)   Hypothyroidism   Iron deficiency anemia   History of fall   Mandibular fracture, closed ([place])   Acute encephalopathy   Hematochezia uti    Medication List    TAKE these medications        acetaminophen 325 MG tablet  Commonly known as:  TYLENOL  Take 650 mg by mouth every 4 (four) hours as needed for pain.     amoxicillin 250 MG capsule  Commonly known as:  AMOXIL  Take 1 capsule (250 mg total) by mouth 3 (three) times daily.     cyanocobalamin 100 MCG tablet  Take 100 mcg by mouth daily.     ferrous sulfate 325 (65 FE) MG tablet  Take 325 mg by mouth daily with breakfast.     lansoprazole 30 MG capsule  Commonly known as:  PREVACID  Take 30 mg by mouth daily at 12 noon.     levothyroxine 100 MCG tablet  Commonly known as:  SYNTHROID, LEVOTHROID  Take 100 mcg by mouth daily before breakfast.     metFORMIN 500 MG 24 hr tablet  Commonly known as:  GLUCOPHAGE-XR  Take 500 mg by mouth daily with breakfast.     multivitamin tablet  Take 1 tablet by mouth daily.        Discharged Condition: Improved    Consults: GI  Significant Diagnostic Studies: Dg Chest 1 View  08/04/2015  CLINICAL DATA:  Fall EXAM: CHEST 1 VIEW COMPARISON:  11/18/2014 FINDINGS: Lungs are essentially clear. Mild right basilar opacity, likely atelectasis. No pleural effusion or pneumothorax. The heart is normal in size. IMPRESSION: No evidence of acute cardiopulmonary disease. Electronically Signed   By: Kara Santos M.D.   On: 08/04/2015 13:52   Dg Ankle Complete Left  08/04/2015  CLINICAL DATA:  Status post fall with left ankle pain.  EXAM: LEFT ANKLE COMPLETE - 3+ VIEW COMPARISON:  None. FINDINGS: No evidence of acute fracture or subluxation. No focal bone lesion or bone destruction. Bone cortex and trabecular architecture appear intact. Plantar calcaneal spur, and os trigonum are noted. There is soft tissue swelling about the ankle. IMPRESSION: No acute fracture or dislocation identified about the left ankle. Soft tissue swelling about the ankle. Electronically Signed   By: Kara Santos M.D.   On: 08/04/2015 13:59   Ct Head Wo Contrast  08/07/2015  CLINICAL DATA:  Increased confusion at night.  Dementia. EXAM: CT HEAD WITHOUT CONTRAST TECHNIQUE: Contiguous axial images were obtained from the base of the skull through the vertex without intravenous contrast. COMPARISON:  08/04/2015 FINDINGS: There is atrophy and chronic small vessel disease changes. No acute intracranial abnormality. Specifically, no hemorrhage, hydrocephalus, mass lesion, acute infarction, or significant intracranial injury. No acute calvarial abnormality. Again noted is the fracture the right mandibular condyle, stable. IMPRESSION: No acute intracranial abnormality. Atrophy, chronic microvascular disease. Electronically Signed   By: Kara Santos M.D.   On: 08/07/2015 15:29   Ct Head Wo Contrast  08/04/2015  CLINICAL DATA:  81 year old female who fell and injured chin and teeth. Alzheimer's dementia. EXAM: CT HEAD WITHOUT CONTRAST CT MAXILLOFACIAL WITHOUT CONTRAST CT CERVICAL SPINE WITHOUT CONTRAST TECHNIQUE: Multidetector CT imaging of  the head, cervical spine, and maxillofacial structures were performed using the standard protocol without intravenous contrast. Multiplanar CT image reconstructions of the cervical spine and maxillofacial structures were also generated. COMPARISON:  Head CT 11/17/2014 FINDINGS: CT HEAD FINDINGS No intracranial hemorrhage. No parenchymal contusion. No midline shift or mass effect. Basilar cisterns are patent. No skull base fracture.  No fluid in the paranasal sinuses or mastoid air cells. Orbits are normal. There is generalized cortical atrophy. There is periventricular subcortical white matter hypodensities. Paranasal sinuses and  mastoid air cells are clear. CT MAXILLOFACIAL FINDINGS Orbital walls are intact. Intraconal contents are clear. Globes are not intact. Maxillary sinus walls are intact. There is thickening of sinus walls. Pterygoid plates are normal. There is no fluid the paranasal sinuses. No nasal bone fracture. Frontal sinuses are clear. No zygomatic arch fracture. There is a fracture of the condylar process of RIGHT mandible with the mandibular head displaced inferiorly and anteriorly (image 37, series 5). The ramus of the RIGHT mandible is migrated superiorly into the condylar fossa (sagittal image 13, series 9) (axial image 35, series 5 and 37 CT CERVICAL SPINE FINDINGS No prevertebral soft tissue swelling. There is joint space narrowing C3-C4 and and C5-6 with joint space widening at C4-C5. This pattern is not changed from CT of 11/17/2014. Multiple levels of endplate spurring and joint space narrowing normal facet articulation. Normal craniocervical junction. There is degenerative pannus posterior to C2 dens. No evidence epidural paraspinal hematoma. IMPRESSION: 1. No acute intracranial findings.  No change from prior. 2. Fracture of the RIGHT mandibular condylar process with displacement mandibular head anterior and inferiorly and superior migration of the RIGHT mandibular ramus into the condylar fossa. 3. 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 No change from prior CT. Findings conveyed toELIZABETH Santos on 08/04/2015  at13:52. Electronically Signed   By: Kara Santos M.D.   On: 08/04/2015 13:53   Ct Cervical Spine Wo Contrast  08/04/2015  CLINICAL DATA:  81 year old female who fell and injured chin and teeth. Alzheimer's dementia. EXAM: CT HEAD WITHOUT CONTRAST CT  MAXILLOFACIAL WITHOUT CONTRAST CT CERVICAL SPINE WITHOUT CONTRAST TECHNIQUE: Multidetector CT imaging of the head, cervical spine, and maxillofacial structures were performed using the standard protocol without intravenous contrast. Multiplanar CT image reconstructions of the cervical spine and maxillofacial structures were also generated. COMPARISON:  Head CT 11/17/2014 FINDINGS: CT HEAD FINDINGS No intracranial hemorrhage. No parenchymal contusion. No midline shift or mass effect. Basilar cisterns are patent. No skull base fracture. No fluid in the paranasal sinuses or mastoid air cells. Orbits are normal. There is generalized cortical atrophy. There is periventricular subcortical white matter hypodensities. Paranasal sinuses and  mastoid air cells are clear. CT MAXILLOFACIAL FINDINGS Orbital walls are intact. Intraconal contents are clear. Globes are not intact. Maxillary sinus walls are intact. There is thickening of sinus walls. Pterygoid plates are normal. There is no fluid the paranasal sinuses. No nasal bone fracture. Frontal sinuses are clear. No zygomatic arch fracture. There is a fracture of the condylar process of RIGHT mandible with the mandibular head displaced inferiorly and anteriorly (image 37, series 5). The ramus of the RIGHT mandible is migrated superiorly into the condylar fossa (sagittal image 13, series 9) (axial image 35, series 5 and 37 CT CERVICAL SPINE FINDINGS No prevertebral soft tissue swelling. There is joint space narrowing C3-C4 and and C5-6 with joint space widening at C4-C5. This pattern is not changed from CT of 11/17/2014. Multiple levels of endplate  spurring and joint space narrowing normal facet articulation. Normal craniocervical junction. There is degenerative pannus posterior to C2 dens. No evidence epidural paraspinal hematoma. IMPRESSION: 1. No acute intracranial findings.  No change from prior. 2. Fracture of the RIGHT mandibular condylar process with displacement  mandibular head anterior and inferiorly and superior migration of the RIGHT mandibular ramus into the condylar fossa. 3. 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 No change from prior CT. Findings conveyed toELIZABETH Santos on 08/04/2015  at13:52. Electronically Signed   By: Kara Santos M.D.   On: 08/04/2015 13:53   Ct Abdomen Pelvis W Contrast  08/07/2015  CLINICAL DATA:  Abdominal pain, blood in stool, increased confusion at night, back pain, nausea, personal history of dementia, at diabetes mellitus, thyroid cancer EXAM: CT ABDOMEN AND PELVIS WITH CONTRAST TECHNIQUE: Multidetector CT imaging of the abdomen and pelvis was performed using the standard protocol following bolus administration of intravenous contrast. Sagittal and coronal MPR images reconstructed from axial data set. CONTRAST:  47mL OMNIPAQUE IOHEXOL 300 MG/ML SOLN IV. Dilute oral contrast. COMPARISON:  11/18/2014 FINDINGS: Minimal bibasilar atelectasis. Mitral annular calcification. Scattered atherosclerotic calcifications. Retro aortic LEFT renal vein. Post cholecystectomy and appendectomy by history. Tiny RIGHT renal cyst. Minimally nodular medial limb RIGHT adrenal gland, stable. Liver, spleen, atrophic pancreas, kidneys, and adrenal glands otherwise normal. Unremarkable bladder, ureters, uterus and adnexa. Wall thickening of rectum and distal sigmoid colon questioned. Stomach and bowel loops otherwise normal appearance. Tiny umbilical hernia containing fat. No mass, adenopathy, free air or free fluid. Numerous pelvic phleboliths. Multifactorial acquired spinal stenosis L4-L5 without acute osseous findings. IMPRESSION: Questionable wall thickening of the rectum and distal sigmoid colon, though this could be potentially related to artifact from underdistention. Endoscopy recommended to exclude distal colitis/proctitis. Multifactorial spinal stenosis L4-L5. Otherwise negative exam. Electronically Signed    By: Lavonia Dana M.D.   On: 08/07/2015 15:36   Dg Chest Port 1 View  08/07/2015  CLINICAL DATA:  Confusion. EXAM: PORTABLE CHEST 1 VIEW COMPARISON:  08/04/2015 FINDINGS: The heart size and mediastinal contours are within normal limits. Stable mild elevation of the right hemidiaphragm. There is no evidence of pulmonary edema, consolidation, pneumothorax, nodule or pleural fluid. The visualized skeletal structures are unremarkable. IMPRESSION: No active disease. Electronically Signed   By: Aletta Edouard M.D.   On: 08/07/2015 13:32   Dg Hand Complete Left  08/04/2015  CLINICAL DATA:  Fall, wrist pain EXAM: LEFT HAND - COMPLETE 3+ VIEW COMPARISON:  None. FINDINGS: No evidence of fracture of the carpal or metacarpal bones. Radiocarpal joint is intact. Phalanges are normal. There is mild subluxation at the first carpal/metacarpal joint with associated sclerosis and joint space narrowing. No soft tissue injury. IMPRESSION: 1. No evidence of fracture. 2. 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 Electronically Signed   By: Kara Santos M.D.   On: 08/04/2015 13:59   Dg Hip Unilat With Pelvis 2-3 Views Left  08/04/2015  CLINICAL DATA:  Left hip pain status post EXAM: DG HIP (WITH OR WITHOUT PELVIS) 2-3V LEFT COMPARISON:  None. FINDINGS: There is no evidence of hip fracture or dislocation. Pelvic bones are intact. There is diffuse osteopenia. Phleboliths and vascular calcifications are noted in the pelvis. IMPRESSION: No acute fracture or dislocation identified about the left hip or pelvis. Electronically Signed   By: Kara Santos M.D.   On: 08/04/2015 13:54   Ct Maxillofacial Wo Cm  08/04/2015  CLINICAL DATA:  81 year old female  who fell and injured chin and teeth. Alzheimer's dementia. EXAM: CT HEAD WITHOUT CONTRAST CT MAXILLOFACIAL WITHOUT CONTRAST CT CERVICAL SPINE WITHOUT CONTRAST TECHNIQUE: Multidetector CT imaging of the head, cervical spine, and maxillofacial  structures were performed using the standard protocol without intravenous contrast. Multiplanar CT image reconstructions of the cervical spine and maxillofacial structures were also generated. COMPARISON:  Head CT 11/17/2014 FINDINGS: CT HEAD FINDINGS No intracranial hemorrhage. No parenchymal contusion. No midline shift or mass effect. Basilar cisterns are patent. No skull base fracture. No fluid in the paranasal sinuses or mastoid air cells. Orbits are normal. There is generalized cortical atrophy. There is periventricular subcortical white matter hypodensities. Paranasal sinuses and  mastoid air cells are clear. CT MAXILLOFACIAL FINDINGS Orbital walls are intact. Intraconal contents are clear. Globes are not intact. Maxillary sinus walls are intact. There is thickening of sinus walls. Pterygoid plates are normal. There is no fluid the paranasal sinuses. No nasal bone fracture. Frontal sinuses are clear. No zygomatic arch fracture. There is a fracture of the condylar process of RIGHT mandible with the mandibular head displaced inferiorly and anteriorly (image 37, series 5). The ramus of the RIGHT mandible is migrated superiorly into the condylar fossa (sagittal image 13, series 9) (axial image 35, series 5 and 37 CT CERVICAL SPINE FINDINGS No prevertebral soft tissue swelling. There is joint space narrowing C3-C4 and and C5-6 with joint space widening at C4-C5. This pattern is not changed from CT of 11/17/2014. Multiple levels of endplate spurring and joint space narrowing normal facet articulation. Normal craniocervical junction. There is degenerative pannus posterior to C2 dens. No evidence epidural paraspinal hematoma. IMPRESSION: 1. No acute intracranial findings.  No change from prior. 2. Fracture of the RIGHT mandibular condylar process with displacement mandibular head anterior and inferiorly and superior migration of the RIGHT mandibular ramus into the condylar fossa. 3. Multilevel disc osteophytic disease  of the spine without evidence acute fracture. No change from prior CT. Findings conveyed toELIZABETH Santos on 08/04/2015  at13:52. Electronically Signed   By: Kara Santos M.D.   On: 08/04/2015 13:53    Lab Results: Basic Metabolic Panel:  Recent Labs  08/09/15 0545 08/10/15 0550  NA 146* 146*  K 2.5* 3.2*  CL 111 113*  CO2 27 27  GLUCOSE 118* 112*  BUN <5* <5*  CREATININE 0.62 0.56  CALCIUM 7.3* 7.1*   Liver Function Tests:  Recent Labs  08/08/15 0733  AST 25  ALT 14  ALKPHOS 51  BILITOT 1.0  PROT 5.6*  ALBUMIN 3.5     CBC:  Recent Labs  08/07/15 1230  08/08/15 1950 08/09/15 0545  WBC 6.6  < > 5.6 5.1  NEUTROABS 5.4  --   --  3.5  HGB 10.3*  < > 9.5* 10.2*  HCT 30.1*  < > 27.3* 30.4*  MCV 90.4  < > 89.5 90.2  PLT 127*  < > 141* 142*  < > = values in this interval not displayed.  Recent Results (from the past 240 hour(s))  Urine culture     Status: None   Collection Time: 08/04/15  3:24 PM  Result Value Ref Range Status   Specimen Description URINE, CLEAN CATCH  Final   Special Requests NONE  Final   Culture   Final    1,000 COLONIES/mL INSIGNIFICANT GROWTH Performed at [facility]    Report Status 08/06/2015 FINAL  Final  Urine culture     Status: None   Collection Time: 08/07/15 12:35  PM  Result Value Ref Range Status   Specimen Description URINE, CATHETERIZED  Final   Special Requests NONE  Final   Culture   Final    >=100,000 COLONIES/mL KLEBSIELLA SPECIES Performed at [facility]    Report Status 08/09/2015 FINAL  Final   Organism ID, Bacteria KLEBSIELLA SPECIES  Final      Susceptibility   Klebsiella species - MIC*    AMPICILLIN >=32 RESISTANT Resistant     CEFAZOLIN <=4 SENSITIVE Sensitive     CEFTRIAXONE <=1 SENSITIVE Sensitive     CIPROFLOXACIN <=0.25 SENSITIVE Sensitive     GENTAMICIN <=1 SENSITIVE Sensitive     IMIPENEM <=0.25 SENSITIVE Sensitive     NITROFURANTOIN 64 INTERMEDIATE Intermediate      TRIMETH/SULFA <=20 SENSITIVE Sensitive     AMPICILLIN/SULBACTAM 4 SENSITIVE Sensitive     PIP/TAZO <=4 SENSITIVE Sensitive     * >=100,000 COLONIES/mL KLEBSIELLA SPECIES  MRSA PCR Screening     Status: Abnormal   Collection Time: 08/07/15 11:25 PM  Result Value Ref Range Status   MRSA by PCR POSITIVE (A) NEGATIVE Final    Comment:        The GeneXpert MRSA Assay (FDA approved for NASAL specimens only), is one component of a comprehensive MRSA colonization surveillance program. It is not intended to diagnose MRSA infection nor to guide or monitor treatment for MRSA infections. RESULT CALLED TO, READ BACK BY AND VERIFIED WITH: COVINGTON Santos AT 0143 ON [id number] BY FORSYTH K   C difficile quick scan w PCR reflex     Status: None   Collection Time: 08/08/15  9:45 AM  Result Value Ref Range Status   C Diff antigen NEGATIVE NEGATIVE Final   C Diff toxin NEGATIVE NEGATIVE Final   C Diff interpretation Negative for toxigenic C. difficile  Final     Hospital Course: This is a 81 year old who came in because of bleeding. She has chronic GI bleeding but had an acute exacerbation. She also had encephalopathy. She does have dementia at baseline. Her bleeding seemed to resolve fairly quickly. She had urine culture done grew Klebsiella which can be treated with cipro. After evaluation it was felt that she would be appropriate for assisted living facility level of care and that has been arranged  Discharge Exam: Blood pressure 162/66, pulse 73, temperature 98 F (36.7 C), temperature source Oral, resp. rate 20, height 4\' 11"  (1.499 m), weight 44.135 kg (97 lb 4.8 oz), SpO2 100 %. She is awake and alert. She is still mildly confused.  Disposition: Transfer to ALF. Her CBC from today is pending and assuming that her hemoglobin level looks okay she will be transferred`. I originally wrote for amoxicillin for her urinary tract infection but this should be ignored and she should be on Cipro 250 mg twice a  day for 7 days      Signed: Philipp Callegari Santos   08/10/2015, 9:23 AM

## 2015-08-10 NOTE — Care Management Note (Signed)
Case Management Note  Patient Details  Name: Kara Santos MRN: [id number] Date of Birth: 01/03/1917  Expected Discharge Date:    08/10/2015              Expected Discharge Plan:  Assisted Living / Rest Home  In-House Referral:  Clinical Social Work  Discharge planning Services  CM Consult  Post Acute Care Choice:  NA Choice offered to:  NA  DME Arranged:    DME Agency:     HH Arranged:    [place] Agency:     Status of Service:  Completed, signed off  Medicare Important Message Given:  Yes-second notification given Date Medicare IM Given:    Medicare IM give by:    Date Additional Medicare IM Given:    Additional Medicare Important Message give by:     If discussed at [place] of Stay Meetings, dates discussed:    Additional Comments: Pt discharging to ALF today, CSW has arranged for placement. Pt and family are in agreement to DC plan. No CM needs.  Sherald Barge, RN 08/10/2015, 12:15 PM

## 2015-08-10 NOTE — Care Management Important Message (Signed)
Important Message  Patient Details  Name: KAMAIYAH USELTON MRN: [id number] Date of Birth: 07/01/1917   Medicare Important Message Given:  Yes-second notification given    Sherald Barge, RN 08/10/2015, 12:15 PM

## 2015-08-14 DIAGNOSIS — S02611D Fracture of condylar process of right mandible, subsequent encounter for fracture with routine healing: Secondary | ICD-10-CM | POA: Diagnosis not present

## 2015-08-14 DIAGNOSIS — K922 Gastrointestinal hemorrhage, unspecified: Secondary | ICD-10-CM | POA: Diagnosis not present

## 2015-08-14 DIAGNOSIS — R260 Ataxic gait: Secondary | ICD-10-CM | POA: Diagnosis not present

## 2015-08-14 DIAGNOSIS — F0280 Dementia in other diseases classified elsewhere without behavioral disturbance: Secondary | ICD-10-CM | POA: Diagnosis not present

## 2015-08-14 DIAGNOSIS — M2578 Osteophyte, vertebrae: Secondary | ICD-10-CM | POA: Diagnosis not present

## 2015-08-14 DIAGNOSIS — M8580 Other specified disorders of bone density and structure, unspecified site: Secondary | ICD-10-CM | POA: Diagnosis not present

## 2015-08-14 DIAGNOSIS — Z7984 Long term (current) use of oral hypoglycemic drugs: Secondary | ICD-10-CM | POA: Diagnosis not present

## 2015-08-14 DIAGNOSIS — Z9181 History of falling: Secondary | ICD-10-CM | POA: Diagnosis not present

## 2015-08-14 DIAGNOSIS — E119 Type 2 diabetes mellitus without complications: Secondary | ICD-10-CM | POA: Diagnosis not present

## 2015-08-14 DIAGNOSIS — G309 Alzheimer's disease, unspecified: Secondary | ICD-10-CM | POA: Diagnosis not present

## 2015-08-14 DIAGNOSIS — Z8781 Personal history of (healed) traumatic fracture: Secondary | ICD-10-CM | POA: Diagnosis not present

## 2015-08-14 DIAGNOSIS — D509 Iron deficiency anemia, unspecified: Secondary | ICD-10-CM | POA: Diagnosis not present

## 2015-08-14 DIAGNOSIS — N390 Urinary tract infection, site not specified: Secondary | ICD-10-CM | POA: Diagnosis not present

## 2015-08-14 DIAGNOSIS — B961 Klebsiella pneumoniae [K. pneumoniae] as the cause of diseases classified elsewhere: Secondary | ICD-10-CM | POA: Diagnosis not present

## 2015-08-16 DIAGNOSIS — E119 Type 2 diabetes mellitus without complications: Secondary | ICD-10-CM | POA: Diagnosis not present

## 2015-08-16 DIAGNOSIS — S02611D Fracture of condylar process of right mandible, subsequent encounter for fracture with routine healing: Secondary | ICD-10-CM | POA: Diagnosis not present

## 2015-08-16 DIAGNOSIS — K922 Gastrointestinal hemorrhage, unspecified: Secondary | ICD-10-CM | POA: Diagnosis not present

## 2015-08-16 DIAGNOSIS — D509 Iron deficiency anemia, unspecified: Secondary | ICD-10-CM | POA: Diagnosis not present

## 2015-08-16 DIAGNOSIS — B961 Klebsiella pneumoniae [K. pneumoniae] as the cause of diseases classified elsewhere: Secondary | ICD-10-CM | POA: Diagnosis not present

## 2015-08-16 DIAGNOSIS — N390 Urinary tract infection, site not specified: Secondary | ICD-10-CM | POA: Diagnosis not present

## 2015-08-20 DIAGNOSIS — S02611D Fracture of condylar process of right mandible, subsequent encounter for fracture with routine healing: Secondary | ICD-10-CM | POA: Diagnosis not present

## 2015-08-20 DIAGNOSIS — E119 Type 2 diabetes mellitus without complications: Secondary | ICD-10-CM | POA: Diagnosis not present

## 2015-08-20 DIAGNOSIS — B961 Klebsiella pneumoniae [K. pneumoniae] as the cause of diseases classified elsewhere: Secondary | ICD-10-CM | POA: Diagnosis not present

## 2015-08-20 DIAGNOSIS — N390 Urinary tract infection, site not specified: Secondary | ICD-10-CM | POA: Diagnosis not present

## 2015-08-20 DIAGNOSIS — K922 Gastrointestinal hemorrhage, unspecified: Secondary | ICD-10-CM | POA: Diagnosis not present

## 2015-08-20 DIAGNOSIS — D509 Iron deficiency anemia, unspecified: Secondary | ICD-10-CM | POA: Diagnosis not present

## 2015-08-21 DIAGNOSIS — B961 Klebsiella pneumoniae [K. pneumoniae] as the cause of diseases classified elsewhere: Secondary | ICD-10-CM | POA: Diagnosis not present

## 2015-08-21 DIAGNOSIS — K922 Gastrointestinal hemorrhage, unspecified: Secondary | ICD-10-CM | POA: Diagnosis not present

## 2015-08-21 DIAGNOSIS — N390 Urinary tract infection, site not specified: Secondary | ICD-10-CM | POA: Diagnosis not present

## 2015-08-21 DIAGNOSIS — S02611D Fracture of condylar process of right mandible, subsequent encounter for fracture with routine healing: Secondary | ICD-10-CM | POA: Diagnosis not present

## 2015-08-21 DIAGNOSIS — D509 Iron deficiency anemia, unspecified: Secondary | ICD-10-CM | POA: Diagnosis not present

## 2015-08-21 DIAGNOSIS — E119 Type 2 diabetes mellitus without complications: Secondary | ICD-10-CM | POA: Diagnosis not present

## 2015-08-23 DIAGNOSIS — S02611D Fracture of condylar process of right mandible, subsequent encounter for fracture with routine healing: Secondary | ICD-10-CM | POA: Diagnosis not present

## 2015-08-23 DIAGNOSIS — N390 Urinary tract infection, site not specified: Secondary | ICD-10-CM | POA: Diagnosis not present

## 2015-08-23 DIAGNOSIS — D509 Iron deficiency anemia, unspecified: Secondary | ICD-10-CM | POA: Diagnosis not present

## 2015-08-23 DIAGNOSIS — K922 Gastrointestinal hemorrhage, unspecified: Secondary | ICD-10-CM | POA: Diagnosis not present

## 2015-08-23 DIAGNOSIS — E119 Type 2 diabetes mellitus without complications: Secondary | ICD-10-CM | POA: Diagnosis not present

## 2015-08-23 DIAGNOSIS — B961 Klebsiella pneumoniae [K. pneumoniae] as the cause of diseases classified elsewhere: Secondary | ICD-10-CM | POA: Diagnosis not present

## 2015-08-26 DIAGNOSIS — B961 Klebsiella pneumoniae [K. pneumoniae] as the cause of diseases classified elsewhere: Secondary | ICD-10-CM | POA: Diagnosis not present

## 2015-08-26 DIAGNOSIS — E119 Type 2 diabetes mellitus without complications: Secondary | ICD-10-CM | POA: Diagnosis not present

## 2015-08-26 DIAGNOSIS — D509 Iron deficiency anemia, unspecified: Secondary | ICD-10-CM | POA: Diagnosis not present

## 2015-08-26 DIAGNOSIS — S02611D Fracture of condylar process of right mandible, subsequent encounter for fracture with routine healing: Secondary | ICD-10-CM | POA: Diagnosis not present

## 2015-08-26 DIAGNOSIS — K922 Gastrointestinal hemorrhage, unspecified: Secondary | ICD-10-CM | POA: Diagnosis not present

## 2015-08-26 DIAGNOSIS — N390 Urinary tract infection, site not specified: Secondary | ICD-10-CM | POA: Diagnosis not present

## 2015-08-27 DIAGNOSIS — E119 Type 2 diabetes mellitus without complications: Secondary | ICD-10-CM | POA: Diagnosis not present

## 2015-08-27 DIAGNOSIS — K922 Gastrointestinal hemorrhage, unspecified: Secondary | ICD-10-CM | POA: Diagnosis not present

## 2015-08-27 DIAGNOSIS — S02611D Fracture of condylar process of right mandible, subsequent encounter for fracture with routine healing: Secondary | ICD-10-CM | POA: Diagnosis not present

## 2015-08-27 DIAGNOSIS — N390 Urinary tract infection, site not specified: Secondary | ICD-10-CM | POA: Diagnosis not present

## 2015-08-27 DIAGNOSIS — D509 Iron deficiency anemia, unspecified: Secondary | ICD-10-CM | POA: Diagnosis not present

## 2015-08-27 DIAGNOSIS — B961 Klebsiella pneumoniae [K. pneumoniae] as the cause of diseases classified elsewhere: Secondary | ICD-10-CM | POA: Diagnosis not present

## 2015-08-29 DIAGNOSIS — N390 Urinary tract infection, site not specified: Secondary | ICD-10-CM | POA: Diagnosis not present

## 2015-08-29 DIAGNOSIS — D509 Iron deficiency anemia, unspecified: Secondary | ICD-10-CM | POA: Diagnosis not present

## 2015-08-29 DIAGNOSIS — E119 Type 2 diabetes mellitus without complications: Secondary | ICD-10-CM | POA: Diagnosis not present

## 2015-08-29 DIAGNOSIS — B961 Klebsiella pneumoniae [K. pneumoniae] as the cause of diseases classified elsewhere: Secondary | ICD-10-CM | POA: Diagnosis not present

## 2015-08-29 DIAGNOSIS — S02611D Fracture of condylar process of right mandible, subsequent encounter for fracture with routine healing: Secondary | ICD-10-CM | POA: Diagnosis not present

## 2015-08-29 DIAGNOSIS — K922 Gastrointestinal hemorrhage, unspecified: Secondary | ICD-10-CM | POA: Diagnosis not present

## 2015-08-31 DIAGNOSIS — K922 Gastrointestinal hemorrhage, unspecified: Secondary | ICD-10-CM | POA: Diagnosis not present

## 2015-08-31 DIAGNOSIS — B961 Klebsiella pneumoniae [K. pneumoniae] as the cause of diseases classified elsewhere: Secondary | ICD-10-CM | POA: Diagnosis not present

## 2015-08-31 DIAGNOSIS — S02611D Fracture of condylar process of right mandible, subsequent encounter for fracture with routine healing: Secondary | ICD-10-CM | POA: Diagnosis not present

## 2015-08-31 DIAGNOSIS — N390 Urinary tract infection, site not specified: Secondary | ICD-10-CM | POA: Diagnosis not present

## 2015-08-31 DIAGNOSIS — D509 Iron deficiency anemia, unspecified: Secondary | ICD-10-CM | POA: Diagnosis not present

## 2015-08-31 DIAGNOSIS — E119 Type 2 diabetes mellitus without complications: Secondary | ICD-10-CM | POA: Diagnosis not present

## 2015-09-03 DIAGNOSIS — K922 Gastrointestinal hemorrhage, unspecified: Secondary | ICD-10-CM | POA: Diagnosis not present

## 2015-09-03 DIAGNOSIS — N390 Urinary tract infection, site not specified: Secondary | ICD-10-CM | POA: Diagnosis not present

## 2015-09-03 DIAGNOSIS — D509 Iron deficiency anemia, unspecified: Secondary | ICD-10-CM | POA: Diagnosis not present

## 2015-09-03 DIAGNOSIS — E119 Type 2 diabetes mellitus without complications: Secondary | ICD-10-CM | POA: Diagnosis not present

## 2015-09-03 DIAGNOSIS — S02611D Fracture of condylar process of right mandible, subsequent encounter for fracture with routine healing: Secondary | ICD-10-CM | POA: Diagnosis not present

## 2015-09-03 DIAGNOSIS — B961 Klebsiella pneumoniae [K. pneumoniae] as the cause of diseases classified elsewhere: Secondary | ICD-10-CM | POA: Diagnosis not present

## 2015-09-05 DIAGNOSIS — D509 Iron deficiency anemia, unspecified: Secondary | ICD-10-CM | POA: Diagnosis not present

## 2015-09-05 DIAGNOSIS — N390 Urinary tract infection, site not specified: Secondary | ICD-10-CM | POA: Diagnosis not present

## 2015-09-05 DIAGNOSIS — K922 Gastrointestinal hemorrhage, unspecified: Secondary | ICD-10-CM | POA: Diagnosis not present

## 2015-09-05 DIAGNOSIS — S02611D Fracture of condylar process of right mandible, subsequent encounter for fracture with routine healing: Secondary | ICD-10-CM | POA: Diagnosis not present

## 2015-09-05 DIAGNOSIS — B961 Klebsiella pneumoniae [K. pneumoniae] as the cause of diseases classified elsewhere: Secondary | ICD-10-CM | POA: Diagnosis not present

## 2015-09-05 DIAGNOSIS — E119 Type 2 diabetes mellitus without complications: Secondary | ICD-10-CM | POA: Diagnosis not present

## 2015-09-06 ENCOUNTER — Other Ambulatory Visit (HOSPITAL_COMMUNITY): Payer: Self-pay

## 2015-09-06 ENCOUNTER — Other Ambulatory Visit (HOSPITAL_COMMUNITY)
Admission: RE | Admit: 2015-09-06 | Discharge: 2015-09-06 | Disposition: A | Discharge status: Home / Self Care | Payer: Medicare Other | Source: Other Acute Inpatient Hospital | Attending: Pulmonary Disease | Admitting: Pulmonary Disease

## 2015-09-06 DIAGNOSIS — N390 Urinary tract infection, site not specified: Secondary | ICD-10-CM | POA: Insufficient documentation

## 2015-09-06 DIAGNOSIS — D509 Iron deficiency anemia, unspecified: Secondary | ICD-10-CM | POA: Diagnosis not present

## 2015-09-06 DIAGNOSIS — K922 Gastrointestinal hemorrhage, unspecified: Secondary | ICD-10-CM | POA: Diagnosis not present

## 2015-09-06 DIAGNOSIS — E119 Type 2 diabetes mellitus without complications: Secondary | ICD-10-CM | POA: Diagnosis not present

## 2015-09-06 DIAGNOSIS — S02611D Fracture of condylar process of right mandible, subsequent encounter for fracture with routine healing: Secondary | ICD-10-CM | POA: Diagnosis not present

## 2015-09-06 DIAGNOSIS — B961 Klebsiella pneumoniae [K. pneumoniae] as the cause of diseases classified elsewhere: Secondary | ICD-10-CM | POA: Diagnosis not present

## 2015-09-06 LAB — URINALYSIS, ROUTINE W REFLEX MICROSCOPIC
Bilirubin Urine: NEGATIVE
GLUCOSE, UA: NEGATIVE mg/dL
Hgb urine dipstick: NEGATIVE
Ketones, ur: NEGATIVE mg/dL
Nitrite: NEGATIVE
PH: 7 (ref 5.0–8.0)
Protein, ur: NEGATIVE mg/dL
SPECIFIC GRAVITY, URINE: 1.01 (ref 1.005–1.030)

## 2015-09-06 LAB — URINE MICROSCOPIC-ADD ON
BACTERIA UA: NONE SEEN
RBC / HPF: NONE SEEN RBC/hpf (ref 0–5)
Squamous Epithelial / LPF: NONE SEEN

## 2015-09-07 ENCOUNTER — Encounter (HOSPITAL_COMMUNITY): Payer: Self-pay

## 2015-09-07 ENCOUNTER — Encounter (HOSPITAL_COMMUNITY): Payer: Medicare Other | Attending: Oncology

## 2015-09-07 ENCOUNTER — Encounter (HOSPITAL_BASED_OUTPATIENT_CLINIC_OR_DEPARTMENT_OTHER): Payer: Medicare Other

## 2015-09-07 VITALS — BP 135/58 | HR 94 | Temp 97.5°F | Resp 16

## 2015-09-07 DIAGNOSIS — D509 Iron deficiency anemia, unspecified: Secondary | ICD-10-CM | POA: Insufficient documentation

## 2015-09-07 DIAGNOSIS — K31811 Angiodysplasia of stomach and duodenum with bleeding: Secondary | ICD-10-CM | POA: Insufficient documentation

## 2015-09-07 DIAGNOSIS — K922 Gastrointestinal hemorrhage, unspecified: Secondary | ICD-10-CM | POA: Diagnosis not present

## 2015-09-07 LAB — CBC WITH DIFFERENTIAL/PLATELET
BASOS ABS: 0 10*3/uL (ref 0.0–0.1)
BASOS PCT: 0 %
EOS ABS: 0.1 10*3/uL (ref 0.0–0.7)
Eosinophils Relative: 1 %
HCT: 33.4 % — ABNORMAL LOW (ref 36.0–46.0)
HEMOGLOBIN: 11.1 g/dL — AB (ref 12.0–15.0)
Lymphocytes Relative: 13 %
Lymphs Abs: 1.1 10*3/uL (ref 0.7–4.0)
MCH: 29.8 pg (ref 26.0–34.0)
MCHC: 33.2 g/dL (ref 30.0–36.0)
MCV: 89.8 fL (ref 78.0–100.0)
MONOS PCT: 8 %
Monocytes Absolute: 0.7 10*3/uL (ref 0.1–1.0)
NEUTROS PCT: 78 %
Neutro Abs: 6.8 10*3/uL (ref 1.7–7.7)
Platelets: 177 10*3/uL (ref 150–400)
RBC: 3.72 MIL/uL — AB (ref 3.87–5.11)
RDW: 14.1 % (ref 11.5–15.5)
WBC: 8.7 10*3/uL (ref 4.0–10.5)

## 2015-09-07 LAB — URINE CULTURE

## 2015-09-07 LAB — FERRITIN: FERRITIN: 98 ng/mL (ref 11–307)

## 2015-09-07 MED ORDER — SODIUM CHLORIDE 0.9 % IV SOLN
510.0000 mg | Freq: Once | INTRAVENOUS | Status: AC
  Administered 2015-09-07: 510 mg via INTRAVENOUS
  Filled 2015-09-07: qty 17

## 2015-09-07 MED ORDER — SODIUM CHLORIDE 0.9 % IV SOLN
Freq: Once | INTRAVENOUS | Status: AC
  Administered 2015-09-07: 13:00:00 via INTRAVENOUS

## 2015-09-07 NOTE — Progress Notes (Signed)
LABS DRAWN

## 2015-09-07 NOTE — Progress Notes (Signed)
Tolerated iron infusion without problems. Instructed daughter that we will call her if a second dose of iron is needed

## 2015-09-11 DIAGNOSIS — E119 Type 2 diabetes mellitus without complications: Secondary | ICD-10-CM | POA: Diagnosis not present

## 2015-09-11 DIAGNOSIS — N390 Urinary tract infection, site not specified: Secondary | ICD-10-CM | POA: Diagnosis not present

## 2015-09-11 DIAGNOSIS — S02611D Fracture of condylar process of right mandible, subsequent encounter for fracture with routine healing: Secondary | ICD-10-CM | POA: Diagnosis not present

## 2015-09-11 DIAGNOSIS — B961 Klebsiella pneumoniae [K. pneumoniae] as the cause of diseases classified elsewhere: Secondary | ICD-10-CM | POA: Diagnosis not present

## 2015-09-11 DIAGNOSIS — D509 Iron deficiency anemia, unspecified: Secondary | ICD-10-CM | POA: Diagnosis not present

## 2015-09-11 DIAGNOSIS — K922 Gastrointestinal hemorrhage, unspecified: Secondary | ICD-10-CM | POA: Diagnosis not present

## 2015-09-13 DIAGNOSIS — B961 Klebsiella pneumoniae [K. pneumoniae] as the cause of diseases classified elsewhere: Secondary | ICD-10-CM | POA: Diagnosis not present

## 2015-09-13 DIAGNOSIS — K922 Gastrointestinal hemorrhage, unspecified: Secondary | ICD-10-CM | POA: Diagnosis not present

## 2015-09-13 DIAGNOSIS — E119 Type 2 diabetes mellitus without complications: Secondary | ICD-10-CM | POA: Diagnosis not present

## 2015-09-13 DIAGNOSIS — N390 Urinary tract infection, site not specified: Secondary | ICD-10-CM | POA: Diagnosis not present

## 2015-09-13 DIAGNOSIS — S02611D Fracture of condylar process of right mandible, subsequent encounter for fracture with routine healing: Secondary | ICD-10-CM | POA: Diagnosis not present

## 2015-09-13 DIAGNOSIS — D509 Iron deficiency anemia, unspecified: Secondary | ICD-10-CM | POA: Diagnosis not present

## 2015-09-14 DIAGNOSIS — S02611D Fracture of condylar process of right mandible, subsequent encounter for fracture with routine healing: Secondary | ICD-10-CM | POA: Diagnosis not present

## 2015-09-14 DIAGNOSIS — N390 Urinary tract infection, site not specified: Secondary | ICD-10-CM | POA: Diagnosis not present

## 2015-09-14 DIAGNOSIS — E119 Type 2 diabetes mellitus without complications: Secondary | ICD-10-CM | POA: Diagnosis not present

## 2015-09-14 DIAGNOSIS — D509 Iron deficiency anemia, unspecified: Secondary | ICD-10-CM | POA: Diagnosis not present

## 2015-09-14 DIAGNOSIS — K922 Gastrointestinal hemorrhage, unspecified: Secondary | ICD-10-CM | POA: Diagnosis not present

## 2015-09-14 DIAGNOSIS — B961 Klebsiella pneumoniae [K. pneumoniae] as the cause of diseases classified elsewhere: Secondary | ICD-10-CM | POA: Diagnosis not present

## 2015-09-26 ENCOUNTER — Emergency Department (HOSPITAL_COMMUNITY)
Admission: EM | Admit: 2015-09-26 | Discharge: 2015-09-26 | Disposition: A | Discharge status: Home / Self Care | Payer: Medicare Other | Attending: Emergency Medicine | Admitting: Emergency Medicine

## 2015-09-26 ENCOUNTER — Encounter (HOSPITAL_COMMUNITY): Payer: Self-pay

## 2015-09-26 DIAGNOSIS — R251 Tremor, unspecified: Secondary | ICD-10-CM | POA: Diagnosis not present

## 2015-09-26 DIAGNOSIS — D509 Iron deficiency anemia, unspecified: Secondary | ICD-10-CM | POA: Diagnosis not present

## 2015-09-26 DIAGNOSIS — Z79899 Other long term (current) drug therapy: Secondary | ICD-10-CM | POA: Insufficient documentation

## 2015-09-26 DIAGNOSIS — D649 Anemia, unspecified: Secondary | ICD-10-CM | POA: Insufficient documentation

## 2015-09-26 DIAGNOSIS — K219 Gastro-esophageal reflux disease without esophagitis: Secondary | ICD-10-CM | POA: Diagnosis not present

## 2015-09-26 DIAGNOSIS — E039 Hypothyroidism, unspecified: Secondary | ICD-10-CM | POA: Insufficient documentation

## 2015-09-26 DIAGNOSIS — Z8781 Personal history of (healed) traumatic fracture: Secondary | ICD-10-CM | POA: Diagnosis not present

## 2015-09-26 DIAGNOSIS — G300 Alzheimer's disease with early onset: Secondary | ICD-10-CM | POA: Insufficient documentation

## 2015-09-26 DIAGNOSIS — R569 Unspecified convulsions: Secondary | ICD-10-CM | POA: Diagnosis not present

## 2015-09-26 DIAGNOSIS — R531 Weakness: Secondary | ICD-10-CM | POA: Diagnosis not present

## 2015-09-26 DIAGNOSIS — Z7984 Long term (current) use of oral hypoglycemic drugs: Secondary | ICD-10-CM | POA: Insufficient documentation

## 2015-09-26 DIAGNOSIS — Z8659 Personal history of other mental and behavioral disorders: Secondary | ICD-10-CM | POA: Diagnosis not present

## 2015-09-26 DIAGNOSIS — E119 Type 2 diabetes mellitus without complications: Secondary | ICD-10-CM | POA: Insufficient documentation

## 2015-09-26 LAB — CBC WITH DIFFERENTIAL/PLATELET
BASOS ABS: 0 10*3/uL (ref 0.0–0.1)
BASOS PCT: 1 %
Eosinophils Absolute: 0.1 10*3/uL (ref 0.0–0.7)
Eosinophils Relative: 1 %
HEMATOCRIT: 32.9 % — AB (ref 36.0–46.0)
HEMOGLOBIN: 11.3 g/dL — AB (ref 12.0–15.0)
LYMPHS PCT: 16 %
Lymphs Abs: 0.9 10*3/uL (ref 0.7–4.0)
MCH: 30.2 pg (ref 26.0–34.0)
MCHC: 34.3 g/dL (ref 30.0–36.0)
MCV: 88 fL (ref 78.0–100.0)
MONO ABS: 0.5 10*3/uL (ref 0.1–1.0)
Monocytes Relative: 8 %
NEUTROS ABS: 4.1 10*3/uL (ref 1.7–7.7)
NEUTROS PCT: 74 %
Platelets: 131 10*3/uL — ABNORMAL LOW (ref 150–400)
RBC: 3.74 MIL/uL — AB (ref 3.87–5.11)
RDW: 14.1 % (ref 11.5–15.5)
WBC: 5.6 10*3/uL (ref 4.0–10.5)

## 2015-09-26 LAB — BASIC METABOLIC PANEL
ANION GAP: 9 (ref 5–15)
BUN: 13 mg/dL (ref 6–20)
CALCIUM: 8.7 mg/dL — AB (ref 8.9–10.3)
CO2: 27 mmol/L (ref 22–32)
Chloride: 104 mmol/L (ref 101–111)
Creatinine, Ser: 0.66 mg/dL (ref 0.44–1.00)
GFR calc non Af Amer: >60 mL/min (ref >60)
GLUCOSE: 148 mg/dL — AB (ref 65–99)
POTASSIUM: 3.8 mmol/L (ref 3.5–5.1)
Sodium: 140 mmol/L (ref 135–145)

## 2015-09-26 LAB — TROPONIN I: Troponin I: <0.03 ng/mL (ref <0.031)

## 2015-09-26 NOTE — ED Notes (Signed)
Pt is now stating the back of her head feels heavy. States she does not have a headache

## 2015-09-26 NOTE — Discharge Instructions (Signed)
Return for any new or worse symptoms. Labs without any significant abnormalities.

## 2015-09-26 NOTE — ED Notes (Signed)
Pt's daughter here now and states her mom has tremors and jerky movement when she gets upset about things. States she had a complete workup last month and everything was ok

## 2015-09-26 NOTE — ED Provider Notes (Signed)
CSN: [id number]     Arrival date & time 09/26/15  [id number] History  By signing my name below, I, Rayna Sexton, attest that this documentation has been prepared under the direction and in the presence of Fredia Sorrow, MD. Electronically Signed: Rayna Sexton, ED Scribe. 09/26/2015. 11:02 AM.    Chief Complaint  Patient presents with  . Tremors   The history is provided by the patient and a relative. The history is limited by the condition of the patient. No language interpreter was used.   LEVEL 5 CAVEAT: DEMENTIA    HPI Comments: Kara Santos is a 81 y.o. female with a hx of dementia and DM who presents to the Emergency Department by ambulance due to tremors which occurred this morning. Pt's daughter notes that she has frequent "shaking spells" and this morning had an episode where she balled her fists and shook back and forth before crying. Her daughter confirms these are frequent and notes her nursing home is not familiar with these episodes. Her daughter confirms she was seen last month by her PCP and cleared physically but does note she has iron infusions every 3-4 months.   Past Medical History  Diagnosis Date  . Anemia 11/07/2011    chronic IDA requiring multiple transfusions  . Diabetes mellitus   . Hypothyroidism     s/p thyroid surgery for thyroid cancer  . GERD (gastroesophageal reflux disease)   . Iron deficiency anemia 05/05/2012  . Depression   . Closed fracture of greater trochanter of femur ([place]) 02/2013    left  . AVM (arteriovenous malformation) of stomach, acquired with hemorrhage 12/04/2013  . Alzheimer's disease, early onset 55  . Fall at home 11/17/14    multiple rib fractures   Past Surgical History  Procedure Laterality Date  . Esophagogastroduodenoscopy  08/2006    Dr. Trevor Iha [facility], large duodenal diverticulum  . Colonoscopy  08/2006    Dr. Cecelia Byars sided diverticulosis  . Givens small bowel capsule  08/2006    few gastric erosions, numerous small  bowel AVMs with SB erosions  . Double balloon enteroscopy  06/1008    [facility]-->APC of 12 SB AVMs in jejunum. 6 feet of jejunum inspected  . Right shoulder surgery    . Cholecystectomy    . Appendectomy    . Bilateral cataract extractions    . Breast reduction surgery    . Thyroid surgery      thyroid cancer   Family History  Problem Relation Age of Onset  . Cancer Mother   . Cancer Father   . Diabetes Father   . Diabetes Sister   . Cancer Brother    Social History  Substance Use Topics  . Smoking status: Never Smoker   . Smokeless tobacco: Never Used  . Alcohol Use: No   OB History    No data available     Review of Systems  Unable to perform ROS: Dementia  All other systems reviewed and are negative.  Allergies  Asa  Home Medications   Prior to Admission medications   Medication Sig Start Date End Date Taking? Authorizing Provider  acetaminophen (TYLENOL) 325 MG tablet Take 650 mg by mouth every 4 (four) hours as needed for pain.   Yes Historical Provider, MD  conjugated estrogens (PREMARIN) vaginal cream Place 1 Applicatorful vaginally 2 (two) times a week. On Tuesday and Friday.   Yes Historical Provider, MD  cyanocobalamin 100 MCG tablet Take 100 mcg by mouth daily.   Yes Historical  Provider, MD  ferrous sulfate 325 (65 FE) MG tablet Take 325 mg by mouth daily with breakfast.   Yes Historical Provider, MD  lansoprazole (PREVACID) 30 MG capsule Take 30 mg by mouth daily at 12 noon.   Yes Historical Provider, MD  levothyroxine (SYNTHROID, LEVOTHROID) 100 MCG tablet Take 100 mcg by mouth daily before breakfast.   Yes Historical Provider, MD  metFORMIN (GLUCOPHAGE-XR) 500 MG 24 hr tablet Take 500 mg by mouth daily with breakfast.    Yes Historical Provider, MD  Multiple Vitamin (MULTIVITAMIN) tablet Take 1 tablet by mouth daily.   Yes Historical Provider, MD  amoxicillin (AMOXIL) 250 MG capsule Take 1 capsule (250 mg total) by mouth 3 (three) times daily. Patient not  taking: Reported on 09/26/2015 08/10/15   Sinda Du, MD   BP 147/69 mmHg  Pulse 84  Temp(Src) 97.5 F (36.4 C) (Oral)  Resp 14  SpO2 99% Physical Exam  Constitutional: She appears well-developed and well-nourished.  HENT:  Head: Normocephalic and atraumatic.  Mouth/Throat: Oropharynx is clear and moist. No oropharyngeal exudate.  Eyes: EOM are normal. Pupils are equal, round, and reactive to light. No scleral icterus.  Neck: Normal range of motion. No tracheal deviation present.  Cardiovascular: Normal rate, regular rhythm and normal heart sounds.   Pulmonary/Chest: Effort normal and breath sounds normal. No respiratory distress. She has no wheezes. She has no rales.  Abdominal: Soft. Bowel sounds are normal. There is no tenderness.  Musculoskeletal: Normal range of motion. She exhibits no edema.  No swelling in bilateral ankles  Neurological: She is alert. No cranial nerve deficit. She exhibits normal muscle tone. Coordination normal.  Skin: Skin is warm and dry. She is not diaphoretic.  Psychiatric: She has a normal mood and affect. Her behavior is normal.  Nursing note and vitals reviewed.  ED Course  Procedures  DIAGNOSTIC STUDIES: Oxygen Saturation is 99% on RA, normal by my interpretation.    COORDINATION OF CARE: 10:59 AM Discussed next steps with pt and her daughter and they agreed to the plan.   Labs Review Labs Reviewed  CBC WITH DIFFERENTIAL/PLATELET - Abnormal; Notable for the following:    RBC 3.74 (*)    Hemoglobin 11.3 (*)    HCT 32.9 (*)    Platelets 131 (*)    All other components within normal limits  BASIC METABOLIC PANEL - Abnormal; Notable for the following:    Glucose, Bld 148 (*)    Calcium 8.7 (*)    All other components within normal limits  TROPONIN I   Results for orders placed or performed during the hospital encounter of 09/26/15  CBC with Differential  Result Value Ref Range   WBC 5.6 4.0 - 10.5 K/uL   RBC 3.74 (L) 3.87 - 5.11  MIL/uL   Hemoglobin 11.3 (L) 12.0 - 15.0 g/dL   HCT 32.9 (L) 36.0 - 46.0 %   MCV 88.0 78.0 - 100.0 fL   MCH 30.2 26.0 - 34.0 pg   MCHC 34.3 30.0 - 36.0 g/dL   RDW 14.1 11.5 - 15.5 %   Platelets 131 (L) 150 - 400 K/uL   Neutrophils Relative % 74 %   Neutro Abs 4.1 1.7 - 7.7 K/uL   Lymphocytes Relative 16 %   Lymphs Abs 0.9 0.7 - 4.0 K/uL   Monocytes Relative 8 %   Monocytes Absolute 0.5 0.1 - 1.0 K/uL   Eosinophils Relative 1 %   Eosinophils Absolute 0.1 0.0 - 0.7 K/uL  Basophils Relative 1 %   Basophils Absolute 0.0 0.0 - 0.1 K/uL  Basic metabolic panel  Result Value Ref Range   Sodium 140 135 - 145 mmol/L   Potassium 3.8 3.5 - 5.1 mmol/L   Chloride 104 101 - 111 mmol/L   CO2 27 22 - 32 mmol/L   Glucose, Bld 148 (H) 65 - 99 mg/dL   BUN 13 6 - 20 mg/dL   Creatinine, Ser 0.66 0.44 - 1.00 mg/dL   Calcium 8.7 (L) 8.9 - 10.3 mg/dL   GFR calc non Af Amer >60 >60 mL/min   GFR calc Af Amer >60 >60 mL/min   Anion gap 9 5 - 15  Troponin I  Result Value Ref Range   Troponin I <0.03 <0.031 ng/mL     Imaging Review No results found. I have personally reviewed and evaluated these lab results as part of my medical decision-making.   EKG Interpretation None      ED ECG REPORT   Date: 09/26/2015  Rate: 82  Rhythm: normal sinus rhythm and premature atrial contractions (PAC)  QRS Axis: normal  Intervals: normal  ST/T Wave abnormalities: normal  Conduction Disutrbances:none  Narrative Interpretation:   Old EKG Reviewed: none available  I have personally reviewed the EKG tracing and agree with the computerized printout as noted.   MDM   Final diagnoses:  Occasional tremors    The patient sent in from nursing facility for tremors and jerking when she got to her table for breakfast. Patient states that she passed out but family members say that nursing home stated that she did not pass out. Patient without any specific complaints right now neurologically's intact no focal  deficit has some mild dementia but able to answer some questions. Family states that that's baseline. Known to have a history of anemia but no significant anemia today. Electrolytes without significant abnormality EKG without any arrhythmias. Patient stable for discharge back to nursing facility.  I personally performed the services described in this documentation, which was scribed in my presence. The recorded information has been reviewed and is accurate.      Fredia Sorrow, MD 09/26/15 1106

## 2015-09-26 NOTE — ED Notes (Signed)
Pt states she got up, got dressed and went to the dinning room to eat. Sates she just feels week today. Denies other symptoms

## 2015-10-29 ENCOUNTER — Other Ambulatory Visit (HOSPITAL_COMMUNITY)
Admission: RE | Admit: 2015-10-29 | Discharge: 2015-10-29 | Disposition: A | Discharge status: Home / Self Care | Payer: Medicare Other | Source: Other Acute Inpatient Hospital | Attending: Pulmonary Disease | Admitting: Pulmonary Disease

## 2015-10-29 DIAGNOSIS — N390 Urinary tract infection, site not specified: Secondary | ICD-10-CM | POA: Diagnosis not present

## 2015-10-29 LAB — URINALYSIS, ROUTINE W REFLEX MICROSCOPIC
Bilirubin Urine: NEGATIVE
Glucose, UA: NEGATIVE mg/dL
HGB URINE DIPSTICK: NEGATIVE
KETONES UR: NEGATIVE mg/dL
LEUKOCYTES UA: NEGATIVE
Nitrite: NEGATIVE
PROTEIN: NEGATIVE mg/dL
Specific Gravity, Urine: 1.01 (ref 1.005–1.030)
pH: 5.5 (ref 5.0–8.0)

## 2015-10-31 LAB — URINE CULTURE: Culture: >=100000

## 2015-12-06 ENCOUNTER — Encounter (HOSPITAL_COMMUNITY): Payer: Medicare Other

## 2015-12-06 ENCOUNTER — Encounter (HOSPITAL_COMMUNITY): Payer: Medicare Other | Attending: Oncology

## 2015-12-06 ENCOUNTER — Ambulatory Visit (HOSPITAL_COMMUNITY): Payer: Medicare Other | Admitting: Hematology & Oncology

## 2015-12-06 ENCOUNTER — Encounter (HOSPITAL_BASED_OUTPATIENT_CLINIC_OR_DEPARTMENT_OTHER): Payer: Medicare Other | Admitting: Oncology

## 2015-12-06 VITALS — BP 139/60 | HR 75 | Temp 97.7°F | Resp 18

## 2015-12-06 DIAGNOSIS — K922 Gastrointestinal hemorrhage, unspecified: Secondary | ICD-10-CM | POA: Insufficient documentation

## 2015-12-06 DIAGNOSIS — D509 Iron deficiency anemia, unspecified: Secondary | ICD-10-CM | POA: Diagnosis not present

## 2015-12-06 DIAGNOSIS — D500 Iron deficiency anemia secondary to blood loss (chronic): Secondary | ICD-10-CM

## 2015-12-06 DIAGNOSIS — K31811 Angiodysplasia of stomach and duodenum with bleeding: Secondary | ICD-10-CM

## 2015-12-06 LAB — CBC WITH DIFFERENTIAL/PLATELET
Basophils Absolute: 0 10*3/uL (ref 0.0–0.1)
Basophils Relative: 0 %
EOS PCT: 3 %
Eosinophils Absolute: 0.2 10*3/uL (ref 0.0–0.7)
HCT: 30.4 % — ABNORMAL LOW (ref 36.0–46.0)
Hemoglobin: 10 g/dL — ABNORMAL LOW (ref 12.0–15.0)
LYMPHS ABS: 1.5 10*3/uL (ref 0.7–4.0)
LYMPHS PCT: 22 %
MCH: 28.7 pg (ref 26.0–34.0)
MCHC: 32.9 g/dL (ref 30.0–36.0)
MCV: 87.1 fL (ref 78.0–100.0)
MONO ABS: 0.5 10*3/uL (ref 0.1–1.0)
MONOS PCT: 8 %
Neutro Abs: 4.7 10*3/uL (ref 1.7–7.7)
Neutrophils Relative %: 67 %
PLATELETS: 162 10*3/uL (ref 150–400)
RBC: 3.49 MIL/uL — AB (ref 3.87–5.11)
RDW: 15.6 % — AB (ref 11.5–15.5)
WBC: 6.9 10*3/uL (ref 4.0–10.5)

## 2015-12-06 MED ORDER — SODIUM CHLORIDE 0.9 % IV SOLN
510.0000 mg | Freq: Once | INTRAVENOUS | Status: AC
  Administered 2015-12-06: 510 mg via INTRAVENOUS
  Filled 2015-12-06: qty 17

## 2015-12-06 MED ORDER — SODIUM CHLORIDE 0.9 % IV SOLN
Freq: Once | INTRAVENOUS | Status: AC
  Administered 2015-12-06: 14:00:00 via INTRAVENOUS

## 2015-12-06 NOTE — Patient Instructions (Signed)
[place] at [facility] Discharge Instructions  RECOMMENDATIONS MADE BY THE CONSULTANT AND ANY TEST RESULTS WILL BE SENT TO YOUR REFERRING PHYSICIAN.  Exam and discussion today with Kara Crigler, PA. Lab work in 3 and 6 months. IV iron in 3 and 6 months. Office visit in 6 months with Kara Santos.  Thank you for choosing [place] at [facility] to provide your oncology and hematology care.  To afford each patient quality time with our provider, please arrive at least 15 minutes before your scheduled appointment time.   Beginning January 23rd 2017 lab work for the [facility] will be done in the  Main lab at [facility] on 1st floor. If you have a lab appointment with the [facility] please come in thru the  Main Entrance and check in at the main information desk  You need to re-schedule your appointment should you arrive 10 or more minutes late.  We strive to give you quality time with our providers, and arriving late affects you and other patients whose appointments are after yours.  Also, if you no show three or more times for appointments you may be dismissed from the clinic at the providers discretion.     Again, thank you for choosing [facility].  Our hope is that these requests will decrease the amount of time that you wait before being seen by our physicians.       _____________________________________________________________  Should you have questions after your visit to [facility], please contact our office at [phone] between the hours of 8:30 a.m. and 4:30 p.m.  Voicemails left after 4:30 p.m. will not be returned until the following business day.  For prescription refill requests, have your pharmacy contact our office.         Resources For Cancer Patients and their Caregivers ? American Cancer Society: Can assist with transportation, wigs, general needs, runs Look Good Feel Better.         850-018-8899 ? Cancer Care: Provides financial assistance, online support groups, medication/co-pay assistance.  1-800-813-HOPE 239-769-7767) ? [facility] Assists [place] cancer patients and their families through emotional , educational and financial support.  318-611-2859 ? [place] DSS Where to apply for food stamps, Medicaid and utility assistance. (825) 865-2045 ? RCATS: Transportation to medical appointments. (670) 124-3820 ? Social Security Administration: May apply for disability if have a Stage IV cancer. 559-833-9308 3153406636 ? [employer], Disability and Transit Services: Assists with nutrition, care and transit needs. [phone]

## 2015-12-06 NOTE — Progress Notes (Signed)
Patient tolerated infusion well.  VSS.   

## 2015-12-06 NOTE — Assessment & Plan Note (Addendum)
Secondary to AVM and chronic blood loss requiring IV iron replacement every 3 months.  Supportive therapy plan developed to provide patient's historical requirement for iron and this plan is reviewed today.    Labs today as planned: CBC diff, ferritin.   IV Feraheme 510 mg today.  Based upon iron defict calculation today/tomorrow will determine the role for day 8 feraheme.    Labs in 3 and 6 months: CBC diff, ferritin.    Given the patient's difficulty getting to the clinic, we'll perform laboratory work in IVIG replacement the same day. The utility of the day 8 will be determined based upon iron deficit calculation following.   Return in 6 months for follow-up.

## 2015-12-06 NOTE — Progress Notes (Signed)
[name], MD [place] [address]  Iron deficiency anemia  CURRENT THERAPY: IV Feraheme 510 mg day 1 and 8 every 3 months with ferritin level  INTERVAL HISTORY: Kara Santos 82 y.o. female returns for followup of iron deficiency anemia requiring intravenous iron. 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  I personally reviewed and went over laboratory results with the patient.  The results are noted within this dictation.  They will be updated today. Hemoglobin today is 10 g/dL. Ferritin is pending at this time.  The patient is 82 years old.  She is now living at [place]. This is been a good thing for the patient. She is much more social and active being around others than she was when she was living at home by herself. In some respects this has been good for the patient's daughter is well, but she does report that she is now taking care of to household's, and two automobiles, and two separate sets of bills, etc. In this regard, it's been a little bit more hectic and difficult.  She denies any complaints today. She denies seeing any blood in her stool, black stool, or any other sites of bleeding.  Hematologically, she denies any complaints and ROS questioning is negative.  Past Medical History  Diagnosis Date  . Anemia 11/07/2011    chronic IDA requiring multiple transfusions  . Diabetes mellitus   . Hypothyroidism     s/p thyroid surgery for thyroid cancer  . GERD (gastroesophageal reflux disease)   . Iron deficiency anemia 05/05/2012  . Depression   . Closed fracture of greater trochanter of femur ([place]) 02/2013    left  . AVM (arteriovenous malformation) of stomach, acquired with hemorrhage 12/04/2013  . Alzheimer's disease, early onset 4  . Fall at home 11/17/14    multiple rib fractures    has Chronic gastrointestinal bleeding; Diabetes mellitus type  2 in nonobese [facility]); Hypothyroidism; History of thyroid cancer; Iron deficiency anemia; Closed fracture of greater trochanter of femur ([place]); History of fall; AVM (arteriovenous malformation) of stomach, acquired with hemorrhage; Multiple rib fractures; Fall; GI bleed; Mandibular fracture, closed ([place]); Acute encephalopathy; and Hematochezia on her problem list.     is allergic to asa.  Ms. Hevner does not currently have medications on file.  Past Surgical History  Procedure Laterality Date  . Esophagogastroduodenoscopy  08/2006    Dr. Trevor Iha h[facility], large duodenal diverticulum  . Colonoscopy  08/2006    Dr. Cecelia Byars sided diverticulosis  . Givens small bowel capsule  08/2006    few gastric erosions, numerous small bowel AVMs with SB erosions  . Double balloon enteroscopy  06/1008    [facility]-->APC of 12 SB AVMs in jejunum. 6 feet of jejunum inspected  . Right shoulder surgery    . Cholecystectomy    . Appendectomy    . Bilateral cataract extractions    . Breast reduction surgery    . Thyroid surgery      thyroid cancer    Denies any headaches, dizziness, double vision, fevers, chills, night sweats, nausea, vomiting, diarrhea, constipation, chest pain, heart palpitations, shortness of breath, blood in stool, black tarry stool, urinary pain, urinary burning, urinary frequency, hematuria.   PHYSICAL EXAMINATION  ECOG PERFORMANCE STATUS: 1 - Symptomatic but completely ambulatory  There were no vitals filed for this visit. Blood pressure 139/76 Pulse 83  Respirations 18 Temperature 97.28F Oxygen saturation 99% on room air.  GENERAL:alert, no distress, well nourished, well developed, comfortable, cooperative and smiling SKIN: skin color, texture, turgor are normal, no rashes or significant lesions HEAD: Normocephalic, No masses, lesions, tenderness or abnormalities EYES: normal, PERRLA, EOMI, Conjunctiva are pink and non-injected EARS: External ears normal OROPHARYNX:lips,  buccal mucosa, and tongue normal and mucous membranes are moist  NECK: supple, no stridor, trachea midline LYMPH:  not examined BREAST:not examined LUNGS: clear to auscultation  HEART: regular rate & rhythm ABDOMEN:normal bowel sounds BACK: Back symmetric, no curvature. EXTREMITIES:less then 2 second capillary refill, no joint deformities, effusion, or inflammation, no skin discoloration, no cyanosis  NEURO: alert & oriented x 3 with fluent speech, no focal motor/sensory deficits, pleasantly demented, gait normal   LABORATORY DATA: CBC    Component Value Date/Time   WBC 6.9 12/06/2015 1336   RBC 3.49* 12/06/2015 1336   RBC 3.96 11/21/2009 2230   HGB 10.0* 12/06/2015 1336   HCT 30.4* 12/06/2015 1336   PLT 162 12/06/2015 1336   MCV 87.1 12/06/2015 1336   MCH 28.7 12/06/2015 1336   MCHC 32.9 12/06/2015 1336   RDW 15.6* 12/06/2015 1336   LYMPHSABS 1.5 12/06/2015 1336   MONOABS 0.5 12/06/2015 1336   EOSABS 0.2 12/06/2015 1336   BASOSABS 0.0 12/06/2015 1336      Chemistry      Component Value Date/Time   NA 140 09/26/2015 0857   K 3.8 09/26/2015 0857   CL 104 09/26/2015 0857   CO2 27 09/26/2015 0857   BUN 13 09/26/2015 0857   CREATININE 0.66 09/26/2015 0857      Component Value Date/Time   CALCIUM 8.7* 09/26/2015 0857   ALKPHOS 51 08/08/2015 0733   AST 25 08/08/2015 0733   ALT 14 08/08/2015 0733   BILITOT 1.0 08/08/2015 0733      Lab Results  Component Value Date   IRON 77 09/14/2013   TIBC 228* 09/14/2013   FERRITIN 98 09/07/2015     RADIOGRAPHIC STUDIES:  No results found.   ASSESSMENT AND PLAN:  Iron deficiency anemia Secondary to AVM and chronic blood loss requiring IV iron replacement every 3 months.  Supportive therapy plan developed to provide patient's historical requirement for iron and this plan is reviewed today.    Labs today as planned: CBC diff, ferritin.   IV Feraheme 510 mg today.  Based upon iron defict calculation today/tomorrow will  determine the role for day 8 feraheme.    Labs in 3 and 6 months: CBC diff, ferritin.    Given the patient's difficulty getting to the clinic, we'll perform laboratory work in IVIG replacement the same day. The utility of the day 8 will be determined based upon iron deficit calculation following.   Return in 6 months for follow-up.   THERAPY PLAN:  We will continue to monitor counts and provide IV Feraheme as indicated following ferritin result confirmantion.  Frequency and dosing of supportive therapy plan can be altered as needed.  All questions were answered. The patient knows to call the clinic with any problems, questions or concerns. We can certainly see the patient much sooner if necessary.  Patient and plan discussed with Dr. Ancil Linsey and she is in agreement with the aforementioned.   This note is electronically signed by: [name] 12/06/2015 6:46 PM

## 2015-12-06 NOTE — Patient Instructions (Signed)
[place] at [facility] Discharge Instructions  RECOMMENDATIONS MADE BY THE CONSULTANT AND ANY TEST RESULTS WILL BE SENT TO YOUR REFERRING PHYSICIAN.  IV iron today.   Please see MD visit for more information.    Thank you for choosing [place] at [facility] to provide your oncology and hematology care.  To afford each patient quality time with our provider, please arrive at least 15 minutes before your scheduled appointment time.   Beginning January 23rd 2017 lab work for the [facility] will be done in the  Main lab at [facility] on 1st floor. If you have a lab appointment with the [facility] please come in thru the  Main Entrance and check in at the main information desk  You need to re-schedule your appointment should you arrive 10 or more minutes late.  We strive to give you quality time with our providers, and arriving late affects you and other patients whose appointments are after yours.  Also, if you no show three or more times for appointments you may be dismissed from the clinic at the providers discretion.     Again, thank you for choosing [facility].  Our hope is that these requests will decrease the amount of time that you wait before being seen by our physicians.       _____________________________________________________________  Should you have questions after your visit to [facility], please contact our office at [phone] between the hours of 8:30 a.m. and 4:30 p.m.  Voicemails left after 4:30 p.m. will not be returned until the following business day.  For prescription refill requests, have your pharmacy contact our office.         Resources For Cancer Patients and their Caregivers ? American Cancer Society: Can assist with transportation, wigs, general needs, runs Look Good Feel Better.        304-370-3331 ? Cancer Care: Provides financial assistance, online support groups,  medication/co-pay assistance.  1-800-813-HOPE 587-837-5227) ? [facility] Assists [place] cancer patients and their families through emotional , educational and financial support.  626-576-4652 ? [place] DSS Where to apply for food stamps, Medicaid and utility assistance. (579) 579-8445 ? RCATS: Transportation to medical appointments. (484)602-3261 ? Social Security Administration: May apply for disability if have a Stage IV cancer. 660-315-1058 415-076-6927 ? [employer], Disability and Transit Services: Assists with nutrition, care and transit needs. [phone]

## 2015-12-07 ENCOUNTER — Ambulatory Visit (HOSPITAL_COMMUNITY): Payer: Medicare Other | Admitting: Hematology & Oncology

## 2015-12-07 ENCOUNTER — Other Ambulatory Visit (HOSPITAL_COMMUNITY): Payer: Medicare Other

## 2015-12-07 ENCOUNTER — Encounter (HOSPITAL_COMMUNITY): Payer: Medicare Other

## 2015-12-07 ENCOUNTER — Ambulatory Visit (HOSPITAL_COMMUNITY): Payer: Medicare Other | Admitting: Oncology

## 2015-12-07 LAB — FERRITIN: Ferritin: 76 ng/mL (ref 11–307)

## 2016-01-15 ENCOUNTER — Other Ambulatory Visit (HOSPITAL_COMMUNITY)
Admission: RE | Admit: 2016-01-15 | Discharge: 2016-01-15 | Disposition: A | Discharge status: Home / Self Care | Payer: Medicare Other | Source: Other Acute Inpatient Hospital | Attending: Pulmonary Disease | Admitting: Pulmonary Disease

## 2016-01-15 DIAGNOSIS — N390 Urinary tract infection, site not specified: Secondary | ICD-10-CM | POA: Diagnosis not present

## 2016-01-15 LAB — URINE MICROSCOPIC-ADD ON: RBC / HPF: NONE SEEN RBC/hpf (ref 0–5)

## 2016-01-15 LAB — URINALYSIS, ROUTINE W REFLEX MICROSCOPIC
Bilirubin Urine: NEGATIVE
GLUCOSE, UA: NEGATIVE mg/dL
HGB URINE DIPSTICK: NEGATIVE
Ketones, ur: NEGATIVE mg/dL
Nitrite: NEGATIVE
PH: 5.5 (ref 5.0–8.0)
Protein, ur: NEGATIVE mg/dL
SPECIFIC GRAVITY, URINE: 1.01 (ref 1.005–1.030)

## 2016-01-17 LAB — URINE CULTURE: Culture: 9000 — AB

## 2016-01-25 ENCOUNTER — Other Ambulatory Visit (HOSPITAL_COMMUNITY)
Admission: AD | Admit: 2016-01-25 | Discharge: 2016-01-25 | Disposition: A | Discharge status: Home / Self Care | Payer: Medicare Other | Source: Other Acute Inpatient Hospital | Attending: Pulmonary Disease | Admitting: Pulmonary Disease

## 2016-01-25 DIAGNOSIS — N390 Urinary tract infection, site not specified: Secondary | ICD-10-CM | POA: Diagnosis not present

## 2016-01-25 LAB — URINALYSIS, ROUTINE W REFLEX MICROSCOPIC
BILIRUBIN URINE: NEGATIVE
Glucose, UA: 250 mg/dL — AB
Hgb urine dipstick: NEGATIVE
KETONES UR: NEGATIVE mg/dL
Leukocytes, UA: NEGATIVE
NITRITE: NEGATIVE
PH: 6 (ref 5.0–8.0)
PROTEIN: NEGATIVE mg/dL
Specific Gravity, Urine: 1.01 (ref 1.005–1.030)

## 2016-01-27 LAB — URINE CULTURE

## 2016-03-06 ENCOUNTER — Encounter (HOSPITAL_COMMUNITY): Payer: Medicare Other | Attending: Oncology

## 2016-03-06 ENCOUNTER — Other Ambulatory Visit (HOSPITAL_COMMUNITY)
Admission: RE | Admit: 2016-03-06 | Discharge: 2016-03-06 | Disposition: A | Discharge status: Home / Self Care | Payer: Medicare Other | Source: Other Acute Inpatient Hospital | Attending: Pulmonary Disease | Admitting: Pulmonary Disease

## 2016-03-06 ENCOUNTER — Other Ambulatory Visit (HOSPITAL_COMMUNITY): Payer: Medicare Other

## 2016-03-06 VITALS — BP 134/68 | HR 91 | Temp 97.6°F | Resp 18

## 2016-03-06 DIAGNOSIS — D509 Iron deficiency anemia, unspecified: Secondary | ICD-10-CM | POA: Insufficient documentation

## 2016-03-06 DIAGNOSIS — N390 Urinary tract infection, site not specified: Secondary | ICD-10-CM | POA: Insufficient documentation

## 2016-03-06 DIAGNOSIS — K31811 Angiodysplasia of stomach and duodenum with bleeding: Secondary | ICD-10-CM | POA: Insufficient documentation

## 2016-03-06 DIAGNOSIS — D500 Iron deficiency anemia secondary to blood loss (chronic): Secondary | ICD-10-CM

## 2016-03-06 DIAGNOSIS — K922 Gastrointestinal hemorrhage, unspecified: Secondary | ICD-10-CM | POA: Diagnosis not present

## 2016-03-06 LAB — URINALYSIS, ROUTINE W REFLEX MICROSCOPIC
BILIRUBIN URINE: NEGATIVE
Glucose, UA: NEGATIVE mg/dL
HGB URINE DIPSTICK: NEGATIVE
KETONES UR: NEGATIVE mg/dL
NITRITE: NEGATIVE
PROTEIN: NEGATIVE mg/dL
SPECIFIC GRAVITY, URINE: 1.015 (ref 1.005–1.030)
pH: 6 (ref 5.0–8.0)

## 2016-03-06 LAB — URINE MICROSCOPIC-ADD ON
RBC / HPF: NONE SEEN RBC/hpf (ref 0–5)
SQUAMOUS EPITHELIAL / LPF: NONE SEEN

## 2016-03-06 LAB — CBC WITH DIFFERENTIAL/PLATELET
Basophils Absolute: 0.1 10*3/uL (ref 0.0–0.1)
Basophils Relative: 1 %
EOS PCT: 3 %
Eosinophils Absolute: 0.3 10*3/uL (ref 0.0–0.7)
HCT: 32 % — ABNORMAL LOW (ref 36.0–46.0)
Hemoglobin: 10.2 g/dL — ABNORMAL LOW (ref 12.0–15.0)
LYMPHS ABS: 1.6 10*3/uL (ref 0.7–4.0)
LYMPHS PCT: 17 %
MCH: 28.5 pg (ref 26.0–34.0)
MCHC: 31.9 g/dL (ref 30.0–36.0)
MCV: 89.4 fL (ref 78.0–100.0)
MONOS PCT: 8 %
Monocytes Absolute: 0.7 10*3/uL (ref 0.1–1.0)
Neutro Abs: 6.8 10*3/uL (ref 1.7–7.7)
Neutrophils Relative %: 71 %
PLATELETS: 219 10*3/uL (ref 150–400)
RBC: 3.58 MIL/uL — AB (ref 3.87–5.11)
RDW: 16.5 % — ABNORMAL HIGH (ref 11.5–15.5)
WBC: 9.4 10*3/uL (ref 4.0–10.5)

## 2016-03-06 LAB — FERRITIN: Ferritin: 43 ng/mL (ref 11–307)

## 2016-03-06 MED ORDER — SODIUM CHLORIDE 0.9 % IV SOLN
Freq: Once | INTRAVENOUS | Status: AC
  Administered 2016-03-06: 14:00:00 via INTRAVENOUS

## 2016-03-06 MED ORDER — SODIUM CHLORIDE 0.9 % IV SOLN
510.0000 mg | Freq: Once | INTRAVENOUS | Status: AC
  Administered 2016-03-06: 510 mg via INTRAVENOUS
  Filled 2016-03-06: qty 17

## 2016-03-06 NOTE — Patient Instructions (Signed)
[facility] at [facility] Discharge Instructions  RECOMMENDATIONS MADE BY THE CONSULTANT AND ANY TEST RESULTS WILL BE SENT TO YOUR REFERRING PHYSICIAN.  Iron infusion today. Return as scheduled for lab work and office visit.  Thank you for choosing [facility] at [facility] to provide your oncology and hematology care.  To afford each patient quality time with our provider, please arrive at least 15 minutes before your scheduled appointment time.   Beginning January 23rd 2017 lab work for the Cancer Center will be done in the  Main lab at [facility] on 1st floor. If you have a lab appointment with the Cancer Center please come in thru the  Main Entrance and check in at the main information desk  You need to re-schedule your appointment should you arrive 10 or more minutes late.  We strive to give you quality time with our providers, and arriving late affects you and other patients whose appointments are after yours.  Also, if you no show three or more times for appointments you may be dismissed from the clinic at the providers discretion.     Again, thank you for choosing [facility].  Our hope is that these requests will decrease the amount of time that you wait before being seen by our physicians.       _____________________________________________________________  Should you have questions after your visit to  Cancer Center, please contact our office at [phone] between the hours of 8:30 a.m. and 4:30 p.m.  Voicemails left after 4:30 p.m. will not be returned until the following business day.  For prescription refill requests, have your pharmacy contact our office.         Resources For Cancer Patients and their Caregivers ? American Cancer Society: Can assist with transportation, wigs, general needs, runs Look Good Feel Better.        1-888-227-6333 ? Cancer Care: Provides financial assistance, online  support groups, medication/co-pay assistance.  1-800-813-HOPE (4673) ? [facility] Assists [place] cancer patients and their families through emotional , educational and financial support.  336-427-4357 ? [place] DSS Where to apply for food stamps, Medicaid and utility assistance. 336-342-1394 ? RCATS: Transportation to medical appointments. 336-347-2287 ? Social Security Administration: May apply for disability if have a Stage IV cancer. 336-342-7796 1-800-772-1213 ? [place] Aging, Disability and Transit Services: Assists with nutrition, care and transit needs. 336-349-2343  Cancer Center Support Programs: @[email or web address]@ > Cancer Support Group  2nd Tuesday of the month 1pm-2pm, Journey Room  > Creative Journey  3rd Tuesday of the month 1130am-1pm, Journey Room  > Look Good Feel Better  1st Wednesday of the month 10am-12 noon, Journey Room (Call American Cancer Society to register 1-800-395-5775)   

## 2016-03-07 LAB — URINE CULTURE

## 2016-03-07 MED ORDER — CYANOCOBALAMIN 1000 MCG/ML IJ SOLN
INTRAMUSCULAR | Status: AC
  Filled 2016-03-07: qty 1

## 2016-03-25 DIAGNOSIS — K210 Gastro-esophageal reflux disease with esophagitis: Secondary | ICD-10-CM | POA: Diagnosis not present

## 2016-03-25 DIAGNOSIS — I10 Essential (primary) hypertension: Secondary | ICD-10-CM | POA: Diagnosis not present

## 2016-03-25 DIAGNOSIS — D500 Iron deficiency anemia secondary to blood loss (chronic): Secondary | ICD-10-CM | POA: Diagnosis not present

## 2016-03-25 DIAGNOSIS — M1990 Unspecified osteoarthritis, unspecified site: Secondary | ICD-10-CM | POA: Diagnosis not present

## 2016-05-04 ENCOUNTER — Emergency Department (HOSPITAL_COMMUNITY): Payer: Medicare Other

## 2016-05-04 ENCOUNTER — Other Ambulatory Visit (HOSPITAL_COMMUNITY)
Admission: AD | Admit: 2016-05-04 | Discharge: 2016-05-04 | Disposition: A | Discharge status: Home / Self Care | Payer: Medicare Other | Source: Skilled Nursing Facility | Attending: Pulmonary Disease | Admitting: Pulmonary Disease

## 2016-05-04 ENCOUNTER — Encounter (HOSPITAL_COMMUNITY): Payer: Self-pay | Admitting: Emergency Medicine

## 2016-05-04 ENCOUNTER — Emergency Department (HOSPITAL_COMMUNITY)
Admission: EM | Admit: 2016-05-04 | Discharge: 2016-05-04 | Disposition: A | Discharge status: Home / Self Care | Payer: Medicare Other | Attending: Emergency Medicine | Admitting: Emergency Medicine

## 2016-05-04 DIAGNOSIS — E039 Hypothyroidism, unspecified: Secondary | ICD-10-CM | POA: Diagnosis not present

## 2016-05-04 DIAGNOSIS — W19XXXA Unspecified fall, initial encounter: Secondary | ICD-10-CM | POA: Insufficient documentation

## 2016-05-04 DIAGNOSIS — E119 Type 2 diabetes mellitus without complications: Secondary | ICD-10-CM | POA: Insufficient documentation

## 2016-05-04 DIAGNOSIS — Y939 Activity, unspecified: Secondary | ICD-10-CM | POA: Diagnosis not present

## 2016-05-04 DIAGNOSIS — Z79899 Other long term (current) drug therapy: Secondary | ICD-10-CM | POA: Diagnosis not present

## 2016-05-04 DIAGNOSIS — Y929 Unspecified place or not applicable: Secondary | ICD-10-CM | POA: Diagnosis not present

## 2016-05-04 DIAGNOSIS — Y999 Unspecified external cause status: Secondary | ICD-10-CM | POA: Insufficient documentation

## 2016-05-04 DIAGNOSIS — Z7984 Long term (current) use of oral hypoglycemic drugs: Secondary | ICD-10-CM | POA: Diagnosis not present

## 2016-05-04 DIAGNOSIS — Z792 Long term (current) use of antibiotics: Secondary | ICD-10-CM | POA: Insufficient documentation

## 2016-05-04 DIAGNOSIS — M25552 Pain in left hip: Secondary | ICD-10-CM | POA: Diagnosis not present

## 2016-05-04 DIAGNOSIS — S79912A Unspecified injury of left hip, initial encounter: Secondary | ICD-10-CM | POA: Diagnosis not present

## 2016-05-04 DIAGNOSIS — R51 Headache: Secondary | ICD-10-CM | POA: Diagnosis not present

## 2016-05-04 DIAGNOSIS — N390 Urinary tract infection, site not specified: Secondary | ICD-10-CM | POA: Insufficient documentation

## 2016-05-04 DIAGNOSIS — G309 Alzheimer's disease, unspecified: Secondary | ICD-10-CM | POA: Diagnosis not present

## 2016-05-04 LAB — URINALYSIS, ROUTINE W REFLEX MICROSCOPIC
BILIRUBIN URINE: NEGATIVE
Glucose, UA: NEGATIVE mg/dL
HGB URINE DIPSTICK: NEGATIVE
Ketones, ur: NEGATIVE mg/dL
Nitrite: NEGATIVE
Protein, ur: NEGATIVE mg/dL
pH: 6 (ref 5.0–8.0)

## 2016-05-04 LAB — CBC
HEMATOCRIT: 29.7 % — AB (ref 36.0–46.0)
Hemoglobin: 10 g/dL — ABNORMAL LOW (ref 12.0–15.0)
MCH: 29.8 pg (ref 26.0–34.0)
MCHC: 33.7 g/dL (ref 30.0–36.0)
MCV: 88.4 fL (ref 78.0–100.0)
PLATELETS: 168 10*3/uL (ref 150–400)
RBC: 3.36 MIL/uL — AB (ref 3.87–5.11)
RDW: 14.2 % (ref 11.5–15.5)
WBC: 6.3 10*3/uL (ref 4.0–10.5)

## 2016-05-04 LAB — URINE MICROSCOPIC-ADD ON

## 2016-05-04 NOTE — ED Provider Notes (Signed)
[facility] DEPT Provider Note   CSN: [id number] Arrival date & time: 05/04/16  1058  First Provider Contact:   First MD Initiated Contact with Patient 05/04/16 1106     By signing my name below, I, Eustaquio Maize, attest that this documentation has been prepared under the direction and in the presence of Noemi Chapel, MD. Electronically Signed: Eustaquio Maize, ED Scribe. 05/04/16. 11:13 AM   History   Chief Complaint Chief Complaint  Patient presents with  . Fall   LEVEL 5 CAVEAT for alzheimer's disease  HPI Kara Santos is a 82 y.o. female brought in by ambulance from [place], who presents to the Emergency Department for evaluation s/p possible witnessed ground level fall that occurred earlier today. Per traige report, pt did hit her head on the ground per staff. Unknown LOC. Pt states that the last thing she remembers is standing in front of her chest of drawers. She was feeling at baseline at that time. She is currently complaining of occiput head pain and points at her left hip and buttock and states she is having pain there as well. No prodromal symptoms. Denies vomiting, diarrhea, fever, or any other associated symptoms.    The history is provided by the patient. No language interpreter was used.    Past Medical History:  Diagnosis Date  . Alzheimer's disease, early onset 28  . Anemia 11/07/2011   chronic IDA requiring multiple transfusions  . AVM (arteriovenous malformation) of stomach, acquired with hemorrhage 12/04/2013  . Closed fracture of greater trochanter of femur ([place]) 02/2013   left  . Depression   . Diabetes mellitus   . Fall at home 11/17/14   multiple rib fractures  . GERD (gastroesophageal reflux disease)   . Hypothyroidism    s/p thyroid surgery for thyroid cancer  . Iron deficiency anemia 05/05/2012    Patient Active Problem List   Diagnosis Date Noted  . GI bleed 08/07/2015  . Mandibular fracture, closed ([place]) 08/07/2015  . Acute encephalopathy  08/07/2015  . Hematochezia 08/07/2015  . Fall 11/20/2014  . Multiple rib fractures 11/17/2014  . AVM (arteriovenous malformation) of stomach, acquired with hemorrhage 12/04/2013  . Closed fracture of greater trochanter of femur ([place]) 02/06/2013  . History of fall 02/06/2013  . Iron deficiency anemia 05/05/2012  . Chronic gastrointestinal bleeding 03/04/2012  . Diabetes mellitus type 2 in nonobese ([place]) 03/04/2012  . Hypothyroidism 03/04/2012  . History of thyroid cancer 03/04/2012    Past Surgical History:  Procedure Laterality Date  . APPENDECTOMY    . bilateral cataract extractions    . BREAST REDUCTION SURGERY    . CHOLECYSTECTOMY    . COLONOSCOPY  08/2006   Dr. Cecelia Byars sided diverticulosis  . double balloon enteroscopy  06/1008   [facility]-->APC of 12 SB AVMs in jejunum. 6 feet of jejunum inspected  . ESOPHAGOGASTRODUODENOSCOPY  08/2006   Dr. Trevor Iha [facility], large duodenal diverticulum  . Givens small bowel capsule  08/2006   few gastric erosions, numerous small bowel AVMs with SB erosions  . right shoulder surgery    . THYROID SURGERY     thyroid cancer    OB History    No data available       Home Medications    Prior to Admission medications   Medication Sig Start Date End Date Taking? Authorizing Provider  acetaminophen (TYLENOL) 325 MG tablet Take 650 mg by mouth every 4 (four) hours as needed for pain.    Historical Provider, MD  amoxicillin (  AMOXIL) 250 MG capsule Take 1 capsule (250 mg total) by mouth 3 (three) times daily. Patient not taking: Reported on 09/26/2015 08/10/15   Sinda Du, MD  conjugated estrogens (PREMARIN) vaginal cream Place 1 Applicatorful vaginally 2 (two) times a week. On Tuesday and Friday.    Historical Provider, MD  cyanocobalamin 100 MCG tablet Take 100 mcg by mouth daily.    Historical Provider, MD  ferrous sulfate 325 (65 FE) MG tablet Take 325 mg by mouth daily with breakfast.    Historical Provider, MD  lansoprazole  (PREVACID) 30 MG capsule Take 30 mg by mouth daily at 12 noon.    Historical Provider, MD  levothyroxine (SYNTHROID, LEVOTHROID) 100 MCG tablet Take 100 mcg by mouth daily before breakfast.    Historical Provider, MD  metFORMIN (GLUCOPHAGE-XR) 500 MG 24 hr tablet Take 500 mg by mouth daily with breakfast.     Historical Provider, MD  Multiple Vitamin (MULTIVITAMIN) tablet Take 1 tablet by mouth daily.    Historical Provider, MD    Family History Family History  Problem Relation Age of Onset  . Cancer Mother   . Cancer Father   . Diabetes Father   . Diabetes Sister   . Cancer Brother     Social History Social History  Substance Use Topics  . Smoking status: Never Smoker  . Smokeless tobacco: Never Used  . Alcohol use No     Allergies   Asa [aspirin]   Review of Systems Review of Systems  Unable to perform ROS: Dementia     Physical Exam Updated Vital Signs BP 147/81 (BP Location: Left Arm)   Pulse 92   Temp 98.2 F (36.8 C) (Oral)   Resp 14   Ht 5' (1.524 m)   Wt 100 lb (45.4 kg)   SpO2 96%   BMI 19.53 kg/m   Physical Exam  Constitutional: She appears well-developed and well-nourished. No distress.  HENT:  Head: Normocephalic and atraumatic.  Mouth/Throat: Oropharynx is clear and moist. No oropharyngeal exudate.  No palpable hematomas or depressions  Eyes: Conjunctivae and EOM are normal. Pupils are equal, round, and reactive to light. Right eye exhibits no discharge. Left eye exhibits no discharge. No scleral icterus.  Neck: Normal range of motion. Neck supple. No JVD present. No thyromegaly present.  Cardiovascular: Normal rate, regular rhythm and intact distal pulses.  Exam reveals no gallop and no friction rub.   Murmur heard. Soft systolic murmur  Pulmonary/Chest: Effort normal and breath sounds normal. No respiratory distress. She has no wheezes. She has no rales.  Abdominal: Soft. Bowel sounds are normal. She exhibits no distension and no mass. There  is no tenderness.  Musculoskeletal: Normal range of motion. She exhibits no edema or tenderness.  Full supple ROM of the left hip. Joints and compartments are diffusely supple.   Lymphadenopathy:    She has no cervical adenopathy.  Neurological: She is alert. Coordination normal.  Skin: Skin is warm and dry. No rash noted. No erythema.  Psychiatric: She has a normal mood and affect. Her behavior is normal.  Nursing note and vitals reviewed.    ED Treatments / Results   DIAGNOSTIC STUDIES: Oxygen Saturation is 96% on RA, normal by my interpretation.    COORDINATION OF CARE: 11:11 AM-Discussed treatment plan with pt at bedside and pt agreed to plan.   Labs (all labs ordered are listed, but only abnormal results are displayed) Labs Reviewed  CBC - Abnormal; Notable for the following:  Result Value   RBC 3.36 (*)    Hemoglobin 10.0 (*)    HCT 29.7 (*)    All other components within normal limits    EKG  EKG Interpretation  Date/Time:  Sunday May 04 2016 11:24:40 EDT Ventricular Rate:  88 PR Interval:    QRS Duration: 78 QT Interval:  366 QTC Calculation: 443 R Axis:   48 Text Interpretation:  Sinus rhythm Normal ECG since last tracing no significant change Confirmed by Sabra Heck  MD, [place] ([id number]) on 05/04/2016 1:40:13 PM       Radiology Dg Hip Unilat W Or Wo Pelvis 2-3 Views Left  Result Date: 05/04/2016 CLINICAL DATA:  Fall earlier today.  Left hip pain. EXAM: DG HIP (WITH OR WITHOUT PELVIS) 2-3V LEFT COMPARISON:  08/04/2015 FINDINGS: Mild degenerative changes in the hip joints bilaterally. No acute bony abnormality. Specifically, no fracture, subluxation, or dislocation. Soft tissues are intact. IMPRESSION: No acute bony abnormality. Electronically Signed   By: Rolm Baptise M.D.   On: 05/04/2016 11:45   Procedures Procedures (including critical care time)  Medications Ordered in ED Medications - No data to display   Initial Impression / Assessment and Plan  / ED Course  I have reviewed the triage vital signs and the nursing notes.  Pertinent labs & imaging results that were available during my care of the patient were reviewed by me and considered in my medical decision making (see chart for details).  Clinical Course  Comment By Time  ECG, xray and labs unremarkable - chronic anemia, stable for d/c Noemi Chapel, MD 07/30 1339    I personally performed the services described in this documentation, which was scribed in my presence. The recorded information has been reviewed and is accurate.   No fractures Normal ECG   Final Clinical Impressions(s) / ED Diagnoses   Final diagnoses:  Fall, initial encounter    New Prescriptions New Prescriptions   No medications on file     Noemi Chapel, MD 05/04/16 1345

## 2016-05-04 NOTE — Discharge Instructions (Signed)
Tylenol for pain No broken bones

## 2016-05-04 NOTE — ED Triage Notes (Signed)
Pt from [facility]. Per EMS, pt had a fall earlier this morning. Pt states that she remembers standing with her walker. The next thing she knew she was on the ground. Staff reports pt hit head. Unknown LOC. Pt c/o pain to LT buttock. No shortening or rotation noted to lower extremities. AOX4.

## 2016-05-06 LAB — URINE CULTURE

## 2016-05-14 DIAGNOSIS — M79675 Pain in left toe(s): Secondary | ICD-10-CM | POA: Diagnosis not present

## 2016-05-14 DIAGNOSIS — M79674 Pain in right toe(s): Secondary | ICD-10-CM | POA: Diagnosis not present

## 2016-05-14 DIAGNOSIS — B351 Tinea unguium: Secondary | ICD-10-CM | POA: Diagnosis not present

## 2016-06-11 ENCOUNTER — Other Ambulatory Visit (HOSPITAL_COMMUNITY): Payer: Self-pay | Admitting: *Deleted

## 2016-06-11 DIAGNOSIS — D509 Iron deficiency anemia, unspecified: Secondary | ICD-10-CM

## 2016-06-11 DIAGNOSIS — K31811 Angiodysplasia of stomach and duodenum with bleeding: Secondary | ICD-10-CM

## 2016-06-11 DIAGNOSIS — E039 Hypothyroidism, unspecified: Secondary | ICD-10-CM

## 2016-06-12 ENCOUNTER — Ambulatory Visit (HOSPITAL_COMMUNITY): Payer: Medicare Other

## 2016-06-12 ENCOUNTER — Encounter (HOSPITAL_COMMUNITY): Payer: Medicare Other | Attending: Hematology & Oncology | Admitting: Hematology & Oncology

## 2016-06-12 ENCOUNTER — Encounter (HOSPITAL_COMMUNITY): Payer: Self-pay | Admitting: Hematology & Oncology

## 2016-06-12 ENCOUNTER — Encounter (HOSPITAL_COMMUNITY): Payer: Medicare Other

## 2016-06-12 ENCOUNTER — Encounter (HOSPITAL_BASED_OUTPATIENT_CLINIC_OR_DEPARTMENT_OTHER): Payer: Medicare Other

## 2016-06-12 VITALS — BP 148/67 | HR 102 | Temp 98.2°F | Resp 18 | Wt 89.9 lb

## 2016-06-12 VITALS — BP 133/54 | HR 88 | Temp 97.3°F | Resp 18

## 2016-06-12 DIAGNOSIS — K31811 Angiodysplasia of stomach and duodenum with bleeding: Secondary | ICD-10-CM

## 2016-06-12 DIAGNOSIS — D509 Iron deficiency anemia, unspecified: Secondary | ICD-10-CM

## 2016-06-12 DIAGNOSIS — K922 Gastrointestinal hemorrhage, unspecified: Secondary | ICD-10-CM

## 2016-06-12 DIAGNOSIS — E039 Hypothyroidism, unspecified: Secondary | ICD-10-CM

## 2016-06-12 LAB — URINE MICROSCOPIC-ADD ON

## 2016-06-12 LAB — URINALYSIS, ROUTINE W REFLEX MICROSCOPIC
Bilirubin Urine: NEGATIVE
GLUCOSE, UA: 100 mg/dL — AB
Hgb urine dipstick: NEGATIVE
KETONES UR: NEGATIVE mg/dL
NITRITE: NEGATIVE
PH: 6 (ref 5.0–8.0)
Protein, ur: NEGATIVE mg/dL
SPECIFIC GRAVITY, URINE: 1.015 (ref 1.005–1.030)

## 2016-06-12 LAB — CBC
HEMATOCRIT: 29.7 % — AB (ref 36.0–46.0)
Hemoglobin: 9.4 g/dL — ABNORMAL LOW (ref 12.0–15.0)
MCH: 27.9 pg (ref 26.0–34.0)
MCHC: 31.6 g/dL (ref 30.0–36.0)
MCV: 88.1 fL (ref 78.0–100.0)
PLATELETS: 178 10*3/uL (ref 150–400)
RBC: 3.37 MIL/uL — AB (ref 3.87–5.11)
RDW: 15.7 % — ABNORMAL HIGH (ref 11.5–15.5)
WBC: 12.9 10*3/uL — ABNORMAL HIGH (ref 4.0–10.5)

## 2016-06-12 LAB — FERRITIN: Ferritin: 36 ng/mL (ref 11–307)

## 2016-06-12 MED ORDER — SODIUM CHLORIDE 0.9 % IV SOLN
510.0000 mg | Freq: Once | INTRAVENOUS | Status: AC
  Administered 2016-06-12: 510 mg via INTRAVENOUS
  Filled 2016-06-12: qty 17

## 2016-06-12 MED ORDER — SODIUM CHLORIDE 0.9 % IV SOLN
Freq: Once | INTRAVENOUS | Status: AC
  Administered 2016-06-12: 14:00:00 via INTRAVENOUS

## 2016-06-12 NOTE — Progress Notes (Signed)
Tolerated infusion w/o adverse reaction.  Alert, in no distress.  VSS.  Discharged ambulatory in c/o daughter.  

## 2016-06-12 NOTE — Progress Notes (Signed)
Kara Bogus, MD [place] [id number]  Iron deficiency anemia - Plan: CBC with Differential, Comprehensive metabolic panel, Ferritin  CURRENT THERAPY: IV Feraheme 510 mg day 1 and 8 every 3 months with ferritin level  INTERVAL HISTORY: Kara Santos 82 y.o. female returns for followup of iron deficiency anemia requiring intravenous iron. 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  Patient is accompanied by her daughter. She says she is feeling fine. When I told her that I heard she had a fall and had to go to the ER, Kara Santos says she doesn't recall that.   Kara Santos's daughter said her mom lost eleven pounds and does not eat very well. She snacks throughout the day, but does not have well balanced meals. When I asked Kara Santos what she likes to eat, she said she likes "anything that's good."   When I asked the patient how old her daughter is, she said she did not know. However, her daughter says that she is 26 years old. She will be 82 in two months.  Kara Santos says that she feels her mind is not as good as it was in the past, and she hopes it does not get any worse. She says she has hope her mind will last her whatever life she has left.   Kara Santos notes that she has some hip pain which makes it difficult for her to walk. She notes no other concerns. She denies any rectal bleeding.     Past Medical History:  Diagnosis Date  . Alzheimer's disease, early onset 27  . Anemia 11/07/2011   chronic IDA requiring multiple transfusions  . AVM (arteriovenous malformation) of stomach, acquired with hemorrhage 12/04/2013  . Closed fracture of greater trochanter of femur ([place]) 02/2013   left  . Depression   . Diabetes mellitus   . Fall at home 11/17/14   multiple rib fractures  . GERD (gastroesophageal reflux disease)   . Hypothyroidism    s/p thyroid surgery for thyroid cancer  . Iron deficiency anemia  05/05/2012    has Chronic gastrointestinal bleeding; Diabetes mellitus type 2 in nonobese Kara Santos); Hypothyroidism; History of thyroid cancer; Iron deficiency anemia; Closed fracture of greater trochanter of femur ([place]); History of fall; AVM (arteriovenous malformation) of stomach, acquired with hemorrhage; Multiple rib fractures; Fall; GI bleed; Mandibular fracture, closed ([place]); Acute encephalopathy; and Hematochezia on her problem list.     is allergic to asa [aspirin].  Kara Santos does not currently have medications on file.  Past Surgical History:  Procedure Laterality Date  . APPENDECTOMY    . bilateral cataract extractions    . BREAST REDUCTION SURGERY    . CHOLECYSTECTOMY    . COLONOSCOPY  08/2006   Dr. Cecelia Byars sided diverticulosis  . double balloon enteroscopy  06/1008   [facility]-->APC of 12 SB AVMs in jejunum. 6 feet of jejunum inspected  . ESOPHAGOGASTRODUODENOSCOPY  08/2006   Dr. Trevor Iha h[facility], large duodenal diverticulum  . Givens small bowel capsule  08/2006   few gastric erosions, numerous small bowel AVMs with SB erosions  . right shoulder surgery    . THYROID SURGERY     thyroid cancer    Positive for hip pain. Denies any headaches, dizziness, double vision, fevers, chills, night sweats, nausea, vomiting, diarrhea, constipation, chest pain, heart palpitations, shortness of breath, blood in stool, black tarry stool, urinary pain, urinary  burning, urinary frequency, hematuria. 14 point review of systems was performed and is negative except as detailed under history of present illness and above    PHYSICAL EXAMINATION  ECOG PERFORMANCE STATUS: 1 - Symptomatic but completely ambulatory  Vitals:   06/12/16 1310  BP: (!) 148/67  Pulse: (!) 102  Resp: 18  Temp: 98.2 F (36.8 C)    GENERAL:alert, no distress, well nourished, well developed, comfortable, cooperative and smiling Patient needed assistance getting on the bed for physical exam. SKIN: skin color,  texture, turgor are normal, no rashes or significant lesions HEAD: Normocephalic, No masses, lesions, tenderness or abnormalities EYES: normal, PERRLA, EOMI, Conjunctiva are pink and non-injected EARS: External ears normal OROPHARYNX:lips, buccal mucosa, and tongue normal and mucous membranes are moist  NECK: supple, no stridor, trachea midline LYMPH:  not examined BREAST:not examined LUNGS: clear to auscultation  HEART: regular rate & rhythm ABDOMEN:normal bowel sounds BACK: Back symmetric, no curvature. EXTREMITIES:less then 2 second capillary refill, no joint deformities, effusion, or inflammation, no skin discoloration, no cyanosis  NEURO: alert & oriented x 3 with fluent speech, no focal motor/sensory deficits, pleasantly demented, gait slow uses walker   LABORATORY DATA: CBC    Component Value Date/Time   WBC 12.9 (H) 06/12/2016 1247   RBC 3.37 (L) 06/12/2016 1247   HGB 9.4 (L) 06/12/2016 1247   HCT 29.7 (L) 06/12/2016 1247   PLT 178 06/12/2016 1247   MCV 88.1 06/12/2016 1247   MCH 27.9 06/12/2016 1247   MCHC 31.6 06/12/2016 1247   RDW 15.7 (H) 06/12/2016 1247   LYMPHSABS 1.6 03/06/2016 1500   MONOABS 0.7 03/06/2016 1500   EOSABS 0.3 03/06/2016 1500   BASOSABS 0.1 03/06/2016 1500      Chemistry      Component Value Date/Time   NA 140 09/26/2015 0857   K 3.8 09/26/2015 0857   CL 104 09/26/2015 0857   CO2 27 09/26/2015 0857   BUN 13 09/26/2015 0857   CREATININE 0.66 09/26/2015 0857      Component Value Date/Time   CALCIUM 8.7 (L) 09/26/2015 0857   ALKPHOS 51 08/08/2015 0733   AST 25 08/08/2015 0733   ALT 14 08/08/2015 0733   BILITOT 1.0 08/08/2015 0733       ASSESSMENT AND PLAN:  Iron deficiency anemia secondary to chronic GI related blood loss AVM's Advanced age Alzheimer's   THERAPY PLAN:  We will continue to monitor counts and provide IV Feraheme as indicated following ferritin levels and CBC's. She is somewhat more anemic today than her prior  baseline.  Frequency and dosing of supportive therapy plan can be altered as needed.  I will notify her when ferritin comes back. If needed, I will have her come back next week for additional feraheme.  We will continue with follow-up as previously arranged. Her daughter notes that they wish to continue with this schedule.   All questions were answered. The patient knows to call the clinic with any problems, questions or concerns. We can certainly see the patient much sooner if necessary.  This document serves as a record of services personally performed by Ancil Linsey, MD. It was created on her behalf by Elmyra Ricks, a trained medical scribe. The creation of this record is based on the scribe's personal observations and the provider's statements to them. This document has been checked and approved by the attending provider.  I have reviewed the above documentation for accuracy and completeness, and I agree with the above.  This note  is electronically signed [id number] Cyril Mourning, MD  06/12/2016 2:36 PM

## 2016-06-12 NOTE — Patient Instructions (Addendum)
[place] at [facility] Discharge Instructions  RECOMMENDATIONS MADE BY THE CONSULTANT AND ANY TEST RESULTS WILL BE SENT TO YOUR REFERRING PHYSICIAN.  You saw Dr. Whitney Muse today. Follow up in 3 months with labs and Iron the same day.  Thank you for choosing [place] at [facility] to provide your oncology and hematology care.  To afford each patient quality time with our provider, please arrive at least 15 minutes before your scheduled appointment time.   Beginning January 23rd 2017 lab work for the [facility] will be done in the  Main lab at [facility] on 1st floor. If you have a lab appointment with the [facility] please come in thru the  Main Entrance and check in at the main information desk  You need to re-schedule your appointment should you arrive 10 or more minutes late.  We strive to give you quality time with our providers, and arriving late affects you and other patients whose appointments are after yours.  Also, if you no show three or more times for appointments you may be dismissed from the clinic at the providers discretion.     Again, thank you for choosing [facility].  Our hope is that these requests will decrease the amount of time that you wait before being seen by our physicians.       _____________________________________________________________  Should you have questions after your visit to [facility], please contact our office at [phone] between the hours of 8:30 a.m. and 4:30 p.m.  Voicemails left after 4:30 p.m. will not be returned until the following business day.  For prescription refill requests, have your pharmacy contact our office.         Resources For Cancer Patients and their Caregivers ? American Cancer Society: Can assist with transportation, wigs, general needs, runs Look Good Feel Better.        671-788-0598 ? Cancer Care: Provides financial assistance,  online support groups, medication/co-pay assistance.  1-800-813-HOPE 636-087-3931) ? [facility] Assists [place] cancer patients and their families through emotional , educational and financial support.  (619)586-8510 ? [place] DSS Where to apply for food stamps, Medicaid and utility assistance. 469 470 2081 ? RCATS: Transportation to medical appointments. (903) 252-3973 ? Social Security Administration: May apply for disability if have a Stage IV cancer. 6368196181 743-546-3406 ? [employer], Disability and Transit Services: Assists with nutrition, care and transit needs. [place] Support Programs: @10RELATIVEDAYS @ > Cancer Support Group  2nd Tuesday of the month 1pm-2pm, Journey Room  > Creative Journey  3rd Tuesday of the month 1130am-1pm, Journey Room  > Look Good Feel Better  1st Wednesday of the month 10am-12 noon, Journey Room (Call [facility] to register [phone])

## 2016-06-12 NOTE — Patient Instructions (Signed)
[facility] at [facility] Discharge Instructions  RECOMMENDATIONS MADE BY THE CONSULTANT AND ANY TEST RESULTS WILL BE SENT TO YOUR REFERRING PHYSICIAN.  Iron infusion today. Return as scheduled for lab work and office visit.  Thank you for choosing  [facility] at [facility] to provide your oncology and hematology care.  To afford each patient quality time with our provider, please arrive at least 15 minutes before your scheduled appointment time.   Beginning January 23rd 2017 lab work for the Cancer Center will be done in the  Main lab at [facility] on 1st floor. If you have a lab appointment with the Cancer Center please come in thru the  Main Entrance and check in at the main information desk  You need to re-schedule your appointment should you arrive 10 or more minutes late.  We strive to give you quality time with our providers, and arriving late affects you and other patients whose appointments are after yours.  Also, if you no show three or more times for appointments you may be dismissed from the clinic at the providers discretion.     Again, thank you for choosing [facility].  Our hope is that these requests will decrease the amount of time that you wait before being seen by our physicians.       _____________________________________________________________  Should you have questions after your visit to [facility], please contact our office at [phone] between the hours of 8:30 a.m. and 4:30 p.m.  Voicemails left after 4:30 p.m. will not be returned until the following business day.  For prescription refill requests, have your pharmacy contact our office.         Resources For Cancer Patients and their Caregivers ? American Cancer Society: Can assist with transportation, wigs, general needs, runs Look Good Feel Better.        1-888-227-6333 ? Cancer Care: Provides financial assistance, online  support groups, medication/co-pay assistance.  1-800-813-HOPE (4673) ? [facility] Assists [place] cancer patients and their families through emotional , educational and financial support.  336-427-4357 ? [place] DSS Where to apply for food stamps, Medicaid and utility assistance. 336-342-1394 ? RCATS: Transportation to medical appointments. 336-347-2287 ? Social Security Administration: May apply for disability if have a Stage IV cancer. 336-342-7796 1-800-772-1213 ? [place] Aging, Disability and Transit Services: Assists with nutrition, care and transit needs. 336-349-2343  Cancer Center Support Programs: @10RELATIVEDAYS@ > Cancer Support Group  2nd Tuesday of the month 1pm-2pm, Journey Room  > Creative Journey  3rd Tuesday of the month 1130am-1pm, Journey Room  > Look Good Feel Better  1st Wednesday of the month 10am-12 noon, Journey Room (Call American Cancer Society to register 1-800-395-5775)   

## 2016-06-15 ENCOUNTER — Encounter (HOSPITAL_COMMUNITY): Payer: Self-pay | Admitting: Hematology & Oncology

## 2016-06-26 DIAGNOSIS — I10 Essential (primary) hypertension: Secondary | ICD-10-CM | POA: Diagnosis not present

## 2016-06-26 DIAGNOSIS — M1990 Unspecified osteoarthritis, unspecified site: Secondary | ICD-10-CM | POA: Diagnosis not present

## 2016-06-26 DIAGNOSIS — E119 Type 2 diabetes mellitus without complications: Secondary | ICD-10-CM | POA: Diagnosis not present

## 2016-06-26 DIAGNOSIS — D500 Iron deficiency anemia secondary to blood loss (chronic): Secondary | ICD-10-CM | POA: Diagnosis not present

## 2016-07-01 DIAGNOSIS — Z23 Encounter for immunization: Secondary | ICD-10-CM | POA: Diagnosis not present

## 2016-07-29 DIAGNOSIS — B351 Tinea unguium: Secondary | ICD-10-CM | POA: Diagnosis not present

## 2016-07-29 DIAGNOSIS — M79675 Pain in left toe(s): Secondary | ICD-10-CM | POA: Diagnosis not present

## 2016-07-29 DIAGNOSIS — M79674 Pain in right toe(s): Secondary | ICD-10-CM | POA: Diagnosis not present

## 2016-09-10 ENCOUNTER — Encounter (HOSPITAL_BASED_OUTPATIENT_CLINIC_OR_DEPARTMENT_OTHER): Payer: Medicare Other

## 2016-09-10 ENCOUNTER — Encounter (HOSPITAL_COMMUNITY): Payer: Self-pay | Admitting: Oncology

## 2016-09-10 ENCOUNTER — Encounter (HOSPITAL_COMMUNITY): Payer: Medicare Other | Attending: Oncology | Admitting: Oncology

## 2016-09-10 ENCOUNTER — Encounter (HOSPITAL_COMMUNITY): Payer: Medicare Other

## 2016-09-10 VITALS — BP 126/56 | HR 98 | Temp 97.8°F | Resp 20

## 2016-09-10 DIAGNOSIS — Z85850 Personal history of malignant neoplasm of thyroid: Secondary | ICD-10-CM | POA: Diagnosis not present

## 2016-09-10 DIAGNOSIS — Z9889 Other specified postprocedural states: Secondary | ICD-10-CM | POA: Diagnosis not present

## 2016-09-10 DIAGNOSIS — F329 Major depressive disorder, single episode, unspecified: Secondary | ICD-10-CM | POA: Insufficient documentation

## 2016-09-10 DIAGNOSIS — E119 Type 2 diabetes mellitus without complications: Secondary | ICD-10-CM | POA: Insufficient documentation

## 2016-09-10 DIAGNOSIS — K31811 Angiodysplasia of stomach and duodenum with bleeding: Secondary | ICD-10-CM | POA: Diagnosis not present

## 2016-09-10 DIAGNOSIS — E039 Hypothyroidism, unspecified: Secondary | ICD-10-CM | POA: Insufficient documentation

## 2016-09-10 DIAGNOSIS — D509 Iron deficiency anemia, unspecified: Secondary | ICD-10-CM

## 2016-09-10 DIAGNOSIS — Z809 Family history of malignant neoplasm, unspecified: Secondary | ICD-10-CM | POA: Diagnosis not present

## 2016-09-10 DIAGNOSIS — K922 Gastrointestinal hemorrhage, unspecified: Secondary | ICD-10-CM

## 2016-09-10 DIAGNOSIS — Z833 Family history of diabetes mellitus: Secondary | ICD-10-CM | POA: Diagnosis not present

## 2016-09-10 DIAGNOSIS — D500 Iron deficiency anemia secondary to blood loss (chronic): Secondary | ICD-10-CM

## 2016-09-10 DIAGNOSIS — G309 Alzheimer's disease, unspecified: Secondary | ICD-10-CM | POA: Insufficient documentation

## 2016-09-10 DIAGNOSIS — Z9049 Acquired absence of other specified parts of digestive tract: Secondary | ICD-10-CM | POA: Insufficient documentation

## 2016-09-10 DIAGNOSIS — K219 Gastro-esophageal reflux disease without esophagitis: Secondary | ICD-10-CM | POA: Diagnosis not present

## 2016-09-10 DIAGNOSIS — F0280 Dementia in other diseases classified elsewhere without behavioral disturbance: Secondary | ICD-10-CM | POA: Diagnosis not present

## 2016-09-10 LAB — CBC WITH DIFFERENTIAL/PLATELET
BASOS ABS: 0 10*3/uL (ref 0.0–0.1)
Basophils Relative: 0 %
EOS ABS: 0.2 10*3/uL (ref 0.0–0.7)
EOS PCT: 2 %
HCT: 31.3 % — ABNORMAL LOW (ref 36.0–46.0)
Hemoglobin: 9.5 g/dL — ABNORMAL LOW (ref 12.0–15.0)
LYMPHS ABS: 1.9 10*3/uL (ref 0.7–4.0)
Lymphocytes Relative: 20 %
MCH: 27.9 pg (ref 26.0–34.0)
MCHC: 30.4 g/dL (ref 30.0–36.0)
MCV: 92.1 fL (ref 78.0–100.0)
MONO ABS: 0.9 10*3/uL (ref 0.1–1.0)
Monocytes Relative: 9 %
Neutro Abs: 6.4 10*3/uL (ref 1.7–7.7)
Neutrophils Relative %: 69 %
PLATELETS: 213 10*3/uL (ref 150–400)
RBC: 3.4 MIL/uL — AB (ref 3.87–5.11)
RDW: 15.1 % (ref 11.5–15.5)
WBC: 9.4 10*3/uL (ref 4.0–10.5)

## 2016-09-10 LAB — FERRITIN: FERRITIN: 23 ng/mL (ref 11–307)

## 2016-09-10 LAB — COMPREHENSIVE METABOLIC PANEL
ALT: 16 U/L (ref 14–54)
AST: 29 U/L (ref 15–41)
Albumin: 3.9 g/dL (ref 3.5–5.0)
Alkaline Phosphatase: 59 U/L (ref 38–126)
Anion gap: 12 (ref 5–15)
BUN: 23 mg/dL — ABNORMAL HIGH (ref 6–20)
CHLORIDE: 104 mmol/L (ref 101–111)
CO2: 26 mmol/L (ref 22–32)
CREATININE: 0.9 mg/dL (ref 0.44–1.00)
Calcium: 7.7 mg/dL — ABNORMAL LOW (ref 8.9–10.3)
GFR calc non Af Amer: 52 mL/min — ABNORMAL LOW (ref >60)
Glucose, Bld: 178 mg/dL — ABNORMAL HIGH (ref 65–99)
POTASSIUM: 3.5 mmol/L (ref 3.5–5.1)
SODIUM: 142 mmol/L (ref 135–145)
Total Bilirubin: 0.6 mg/dL (ref 0.3–1.2)
Total Protein: 6.6 g/dL (ref 6.5–8.1)

## 2016-09-10 MED ORDER — SODIUM CHLORIDE 0.9 % IV SOLN
Freq: Once | INTRAVENOUS | Status: AC
  Administered 2016-09-10: 15:00:00 via INTRAVENOUS

## 2016-09-10 MED ORDER — FERUMOXYTOL INJECTION 510 MG/17 ML
510.0000 mg | Freq: Once | INTRAVENOUS | Status: AC
Start: 2016-09-10 — End: 2016-09-10
  Administered 2016-09-10: 510 mg via INTRAVENOUS
  Filled 2016-09-10: qty 17

## 2016-09-10 NOTE — Progress Notes (Signed)
Kara Bogus, MD [place]  [place] [address]  Iron deficiency anemia due to chronic blood loss  CURRENT THERAPY: IV iron replacement with Feraheme every 12 weeks  INTERVAL HISTORY: Kara Santos 82 y.o. female returns for followup of Iron deficiency anemia secondary to chronic GI blood loss, secondary to AVM and chronic blood loss requiring IV iron replacement every 3 months.    Hematologically she is doing well.  She is living at [facility] now which has not only been great for the patient but also her daughter who was previously her caregiver.  She denies any known blood loss.  She asked my opinion regarding her mind.  She asked about 4 times during our conversation.  Review of Systems  Constitutional: Negative.  Negative for chills, fever and weight loss.  HENT: Negative.   Eyes: Negative.   Respiratory: Negative.  Negative for cough.   Cardiovascular: Negative.  Negative for chest pain.  Gastrointestinal: Negative.  Negative for blood in stool and melena.  Genitourinary: Negative.   Musculoskeletal: Negative.   Skin: Negative.   Neurological: Negative.  Negative for weakness.  Endo/Heme/Allergies: Negative.   Psychiatric/Behavioral: Positive for memory loss.    Past Medical History:  Diagnosis Date  . Alzheimer's disease, early onset 50  . Anemia 11/07/2011   chronic IDA requiring multiple transfusions  . AVM (arteriovenous malformation) of stomach, acquired with hemorrhage 12/04/2013  . Closed fracture of greater trochanter of femur ([place]) 02/2013   left  . Depression   . Diabetes mellitus   . Fall at home 11/17/14   multiple rib fractures  . GERD (gastroesophageal reflux disease)   . Hypothyroidism    s/p thyroid surgery for thyroid cancer  . Iron deficiency anemia 05/05/2012    Past Surgical History:  Procedure Laterality Date  . APPENDECTOMY    . bilateral cataract extractions    . BREAST REDUCTION SURGERY    .  CHOLECYSTECTOMY    . COLONOSCOPY  08/2006   Dr. Cecelia Byars sided diverticulosis  . double balloon enteroscopy  06/1008   [facility]-->APC of 12 SB AVMs in jejunum. 6 feet of jejunum inspected  . ESOPHAGOGASTRODUODENOSCOPY  08/2006   Dr. Trevor Iha [facility], large duodenal diverticulum  . Givens small bowel capsule  08/2006   few gastric erosions, numerous small bowel AVMs with SB erosions  . right shoulder surgery    . THYROID SURGERY     thyroid cancer    Family History  Problem Relation Age of Onset  . Cancer Mother   . Cancer Father   . Diabetes Father   . Diabetes Sister   . Cancer Brother     Social History   Social History  . Marital status: Widowed    Spouse name: N/A  . Number of children: N/A  . Years of education: N/A   Social History Main Topics  . Smoking status: Never Smoker  . Smokeless tobacco: Never Used  . Alcohol use No  . Drug use: No  . Sexual activity: No   Other Topics Concern  . None   Social History Narrative  . None     PHYSICAL EXAMINATION  ECOG PERFORMANCE STATUS: 2 - Symptomatic, <50% confined to bed  Vitals:   09/10/16 1401  BP: (!) 153/75  Pulse: (!) 58  Resp: 18  Temp: 97.4 F (36.3 C)    GENERAL:alert, no distress, comfortable, cooperative, smiling and accompanied by her daughter, in chemo-recliner. SKIN: skin color,  texture, turgor are normal, no rashes or significant lesions HEAD: Normocephalic, No masses, lesions, tenderness or abnormalities EYES: normal, EOMI EARS: External ears normal OROPHARYNX:lips, buccal mucosa, and tongue normal and mucous membranes are moist  NECK: supple, trachea midline LYMPH:  not examined BREAST:not examined LUNGS: clear to auscultation  HEART: regular rate & rhythm ABDOMEN:abdomen soft and normal bowel sounds BACK: Back symmetric, no curvature. EXTREMITIES:less then 2 second capillary refill, no joint deformities, effusion, or inflammation, no skin discoloration, no cyanosis  NEURO:  alert & oriented x 3 with fluent speech, dementia.   LABORATORY DATA: CBC    Component Value Date/Time   WBC 9.4 09/10/2016 1346   RBC 3.40 (L) 09/10/2016 1346   HGB 9.5 (L) 09/10/2016 1346   HCT 31.3 (L) 09/10/2016 1346   PLT 213 09/10/2016 1346   MCV 92.1 09/10/2016 1346   MCH 27.9 09/10/2016 1346   MCHC 30.4 09/10/2016 1346   RDW 15.1 09/10/2016 1346   LYMPHSABS 1.9 09/10/2016 1346   MONOABS 0.9 09/10/2016 1346   EOSABS 0.2 09/10/2016 1346   BASOSABS 0.0 09/10/2016 1346      Chemistry      Component Value Date/Time   NA 142 09/10/2016 1346   K 3.5 09/10/2016 1346   CL 104 09/10/2016 1346   CO2 26 09/10/2016 1346   BUN 23 (H) 09/10/2016 1346   CREATININE 0.90 09/10/2016 1346      Component Value Date/Time   CALCIUM 7.7 (L) 09/10/2016 1346   ALKPHOS 59 09/10/2016 1346   AST 29 09/10/2016 1346   ALT 16 09/10/2016 1346   BILITOT 0.6 09/10/2016 1346     Lab Results  Component Value Date   IRON 77 09/14/2013   TIBC 228 (L) 09/14/2013   FERRITIN 36 06/12/2016     PENDING LABS:   RADIOGRAPHIC STUDIES:  No results found.   PATHOLOGY:    ASSESSMENT AND PLAN:  Iron deficiency anemia Iron deficiency anemia secondary to chronic GI blood loss, secondary to AVM and chronic blood loss requiring IV iron replacement every 3 months.    Labs today as planned: CBC diff, ferritin.   I personally reviewed and went over laboratory results with the patient.  The results are noted within this dictation.  HGB is stable, but minimally below baseline.  Ferritin is pending at this time.  IV Feraheme 510 mg today.  Based upon iron defict calculation today/tomorrow will determine the role for day 8 feraheme.    Labs in 3 months: CBC diff, renal function panel, iron/TIBC, ferritin.    Supportive therapy plan is reviewed.  Given the patient's difficulty getting to the clinic, we'll perform laboratory work and IV iron replacement the same day. The utility of the day 8 will be  determined based upon iron deficit calculation following.   Return in 3 months for follow-up.   ORDERS PLACED FOR THIS ENCOUNTER: No orders of the defined types were placed in this encounter.   MEDICATIONS PRESCRIBED THIS ENCOUNTER: Meds ordered this encounter  Medications  . furosemide (LASIX) 40 MG tablet  . DISCONTD: metFORMIN (GLUCOPHAGE) 500 MG tablet    THERAPY PLAN:  Continue supportive therapy plan as outlined above.  All questions were answered. The patient knows to call the clinic with any problems, questions or concerns. We can certainly see the patient much sooner if necessary.  Patient and plan discussed with Dr. Ancil Linsey and she is in agreement with the aforementioned.   This note is electronically signed by: Doy Mince  09/10/2016 5:46 PM

## 2016-09-10 NOTE — Patient Instructions (Signed)
[place] at [facility] Discharge Instructions  RECOMMENDATIONS MADE BY THE CONSULTANT AND ANY TEST RESULTS WILL BE SENT TO YOUR REFERRING PHYSICIAN.  Received Feraheme infusion today. Follow-up as scheduled. Call clinic for any questions or concerns  Thank you for choosing [place] at [facility] to provide your oncology and hematology care.  To afford each patient quality time with our provider, please arrive at least 15 minutes before your scheduled appointment time.   Beginning January 23rd 2017 lab work for the [facility] will be done in the  Main lab at [facility] on 1st floor. If you have a lab appointment with the [facility] please come in thru the  Main Entrance and check in at the main information desk  You need to re-schedule your appointment should you arrive 10 or more minutes late.  We strive to give you quality time with our providers, and arriving late affects you and other patients whose appointments are after yours.  Also, if you no show three or more times for appointments you may be dismissed from the clinic at the providers discretion.     Again, thank you for choosing [facility].  Our hope is that these requests will decrease the amount of time that you wait before being seen by our physicians.       _____________________________________________________________  Should you have questions after your visit to [facility], please contact our office at [phone] between the hours of 8:30 a.m. and 4:30 p.m.  Voicemails left after 4:30 p.m. will not be returned until the following business day.  For prescription refill requests, have your pharmacy contact our office.         Resources For Cancer Patients and their Caregivers ? American Cancer Society: Can assist with transportation, wigs, general needs, runs Look Good Feel Better.        445-175-2984 ? Cancer Care: Provides  financial assistance, online support groups, medication/co-pay assistance.  1-800-813-HOPE 864-213-3045) ? [place] Assists [place] cancer patients and their families through emotional , educational and financial support.  740-590-1833 ? [place] DSS Where to apply for food stamps, Medicaid and utility assistance. 8646864055 ? RCATS: Transportation to medical appointments. (501) 705-1535 ? Social Security Administration: May apply for disability if have a Stage IV cancer. [phone] 548-087-3428 ? [employer], Disability and Transit Services: Assists with nutrition, care and transit needs. [place] Support Programs: @10RELATIVEDAYS @ > Cancer Support Group  2nd Tuesday of the month 1pm-2pm, Journey Room  > Creative Journey  3rd Tuesday of the month 1130am-1pm, Journey Room  > Look Good Feel Better  1st Wednesday of the month 10am-12 noon, Journey Room (Call [place] to register [phone])

## 2016-09-10 NOTE — Patient Instructions (Addendum)
[place] at [facility] Discharge Instructions  RECOMMENDATIONS MADE BY THE CONSULTANT AND ANY TEST RESULTS WILL BE SENT TO YOUR REFERRING PHYSICIAN.  You were seen by Kirby Crigler PA-C. Return in 3 months for labs, IV iron, and follow up.    Thank you for choosing [place] at [facility] to provide your oncology and hematology care.  To afford each patient quality time with our provider, please arrive at least 15 minutes before your scheduled appointment time.   Beginning January 23rd 2017 lab work for the [facility] will be done in the  Main lab at [facility] on 1st floor. If you have a lab appointment with the [facility] please come in thru the  Main Entrance and check in at the main information desk  You need to re-schedule your appointment should you arrive 10 or more minutes late.  We strive to give you quality time with our providers, and arriving late affects you and other patients whose appointments are after yours.  Also, if you no show three or more times for appointments you may be dismissed from the clinic at the providers discretion.     Again, thank you for choosing [facility].  Our hope is that these requests will decrease the amount of time that you wait before being seen by our physicians.       _____________________________________________________________  Should you have questions after your visit to [facility], please contact our office at [phone] between the hours of 8:30 a.m. and 4:30 p.m.  Voicemails left after 4:30 p.m. will not be returned until the following business day.  For prescription refill requests, have your pharmacy contact our office.         Resources For Cancer Patients and their Caregivers ? American Cancer Society: Can assist with transportation, wigs, general needs, runs Look Good Feel Better.        ([phone] ? Cancer Care: Provides financial  assistance, online support groups, medication/co-pay assistance.  1-800-813-HOPE (724)268-8734) ? [place] Assists [place] cancer patients and their families through emotional , educational and financial support.  207-344-3235 ? [place] DSS Where to apply for food stamps, Medicaid and utility assistance. 626-079-0977 ? RCATS: Transportation to medical appointments. (715)826-4324 ? Social Security Administration: May apply for disability if have a Stage IV cancer. 201-210-4404 907-792-7493 ? [employer], Disability and Transit Services: Assists with nutrition, care and transit needs. [place] Support Programs: @10RELATIVEDAYS @ > Cancer Support Group  2nd Tuesday of the month 1pm-2pm, Journey Room  > Creative Journey  3rd Tuesday of the month 1130am-1pm, Journey Room  > Look Good Feel Better  1st Wednesday of the month 10am-12 noon, Journey Room (Call [place] to register [phone])

## 2016-09-10 NOTE — Assessment & Plan Note (Addendum)
Iron deficiency anemia secondary to chronic GI blood loss, secondary to AVM and chronic blood loss requiring IV iron replacement every 3 months.    Labs today as planned: CBC diff, ferritin.   I personally reviewed and went over laboratory results with the patient.  The results are noted within this dictation.  HGB is stable, but minimally below baseline.  Ferritin is pending at this time.  IV Feraheme 510 mg today.  Based upon iron defict calculation today/tomorrow will determine the role for day 8 feraheme.    Labs in 3 months: CBC diff, renal function panel, iron/TIBC, ferritin.    Supportive therapy plan is reviewed.  Given the patient's difficulty getting to the clinic, we'll perform laboratory work and IV iron replacement the same day. The utility of the day 8 will be determined based upon iron deficit calculation following.   Return in 3 months for follow-up.

## 2016-09-10 NOTE — Progress Notes (Signed)
Kara Santos tolerated Feraheme infusion well without complaints or incident. VSS upon discharge. Pt discharged self ambulatory using walker in satisfactory condition accompanied by daughter

## 2016-10-14 DIAGNOSIS — M79675 Pain in left toe(s): Secondary | ICD-10-CM | POA: Diagnosis not present

## 2016-10-14 DIAGNOSIS — M79674 Pain in right toe(s): Secondary | ICD-10-CM | POA: Diagnosis not present

## 2016-10-14 DIAGNOSIS — B351 Tinea unguium: Secondary | ICD-10-CM | POA: Diagnosis not present

## 2016-10-28 DIAGNOSIS — F0390 Unspecified dementia without behavioral disturbance: Secondary | ICD-10-CM | POA: Diagnosis not present

## 2016-10-28 DIAGNOSIS — E119 Type 2 diabetes mellitus without complications: Secondary | ICD-10-CM | POA: Diagnosis not present

## 2016-10-28 DIAGNOSIS — I10 Essential (primary) hypertension: Secondary | ICD-10-CM | POA: Diagnosis not present

## 2016-10-28 DIAGNOSIS — K210 Gastro-esophageal reflux disease with esophagitis: Secondary | ICD-10-CM | POA: Diagnosis not present

## 2016-12-15 ENCOUNTER — Other Ambulatory Visit (HOSPITAL_COMMUNITY): Payer: Self-pay | Admitting: *Deleted

## 2016-12-15 DIAGNOSIS — D508 Other iron deficiency anemias: Secondary | ICD-10-CM

## 2016-12-16 ENCOUNTER — Encounter (HOSPITAL_BASED_OUTPATIENT_CLINIC_OR_DEPARTMENT_OTHER): Payer: Medicare Other

## 2016-12-16 ENCOUNTER — Encounter (HOSPITAL_COMMUNITY): Payer: Medicare Other

## 2016-12-16 ENCOUNTER — Encounter (HOSPITAL_COMMUNITY): Payer: Medicare Other | Attending: Oncology | Admitting: Oncology

## 2016-12-16 ENCOUNTER — Encounter (HOSPITAL_COMMUNITY): Payer: Self-pay | Admitting: Oncology

## 2016-12-16 VITALS — BP 122/61 | HR 92 | Temp 98.3°F | Resp 16

## 2016-12-16 DIAGNOSIS — F329 Major depressive disorder, single episode, unspecified: Secondary | ICD-10-CM | POA: Insufficient documentation

## 2016-12-16 DIAGNOSIS — K922 Gastrointestinal hemorrhage, unspecified: Secondary | ICD-10-CM

## 2016-12-16 DIAGNOSIS — D509 Iron deficiency anemia, unspecified: Secondary | ICD-10-CM | POA: Diagnosis present

## 2016-12-16 DIAGNOSIS — F0280 Dementia in other diseases classified elsewhere without behavioral disturbance: Secondary | ICD-10-CM | POA: Diagnosis not present

## 2016-12-16 DIAGNOSIS — M79605 Pain in left leg: Secondary | ICD-10-CM | POA: Insufficient documentation

## 2016-12-16 DIAGNOSIS — G309 Alzheimer's disease, unspecified: Secondary | ICD-10-CM | POA: Insufficient documentation

## 2016-12-16 DIAGNOSIS — Z9889 Other specified postprocedural states: Secondary | ICD-10-CM | POA: Diagnosis not present

## 2016-12-16 DIAGNOSIS — D500 Iron deficiency anemia secondary to blood loss (chronic): Secondary | ICD-10-CM | POA: Insufficient documentation

## 2016-12-16 DIAGNOSIS — D508 Other iron deficiency anemias: Secondary | ICD-10-CM

## 2016-12-16 DIAGNOSIS — H6122 Impacted cerumen, left ear: Secondary | ICD-10-CM | POA: Insufficient documentation

## 2016-12-16 DIAGNOSIS — E119 Type 2 diabetes mellitus without complications: Secondary | ICD-10-CM | POA: Insufficient documentation

## 2016-12-16 DIAGNOSIS — K219 Gastro-esophageal reflux disease without esophagitis: Secondary | ICD-10-CM | POA: Insufficient documentation

## 2016-12-16 DIAGNOSIS — E039 Hypothyroidism, unspecified: Secondary | ICD-10-CM | POA: Diagnosis not present

## 2016-12-16 DIAGNOSIS — Z833 Family history of diabetes mellitus: Secondary | ICD-10-CM | POA: Insufficient documentation

## 2016-12-16 DIAGNOSIS — Z9049 Acquired absence of other specified parts of digestive tract: Secondary | ICD-10-CM | POA: Diagnosis not present

## 2016-12-16 DIAGNOSIS — Z85850 Personal history of malignant neoplasm of thyroid: Secondary | ICD-10-CM | POA: Diagnosis not present

## 2016-12-16 DIAGNOSIS — Z809 Family history of malignant neoplasm, unspecified: Secondary | ICD-10-CM | POA: Diagnosis not present

## 2016-12-16 DIAGNOSIS — K31811 Angiodysplasia of stomach and duodenum with bleeding: Secondary | ICD-10-CM

## 2016-12-16 LAB — COMPREHENSIVE METABOLIC PANEL
ALBUMIN: 4 g/dL (ref 3.5–5.0)
ALK PHOS: 64 U/L (ref 38–126)
ALT: 19 U/L (ref 14–54)
AST: 30 U/L (ref 15–41)
Anion gap: 13 (ref 5–15)
BILIRUBIN TOTAL: 0.4 mg/dL (ref 0.3–1.2)
BUN: 24 mg/dL — AB (ref 6–20)
CALCIUM: 8.8 mg/dL — AB (ref 8.9–10.3)
CO2: 24 mmol/L (ref 22–32)
CREATININE: 0.97 mg/dL (ref 0.44–1.00)
Chloride: 99 mmol/L — ABNORMAL LOW (ref 101–111)
GFR calc Af Amer: 55 mL/min — ABNORMAL LOW (ref >60)
GFR calc non Af Amer: 47 mL/min — ABNORMAL LOW (ref >60)
GLUCOSE: 192 mg/dL — AB (ref 65–99)
POTASSIUM: 3.9 mmol/L (ref 3.5–5.1)
Sodium: 136 mmol/L (ref 135–145)
TOTAL PROTEIN: 6.5 g/dL (ref 6.5–8.1)

## 2016-12-16 LAB — CBC WITH DIFFERENTIAL/PLATELET
BASOS ABS: 0 10*3/uL (ref 0.0–0.1)
Basophils Relative: 0 %
Eosinophils Absolute: 0.1 10*3/uL (ref 0.0–0.7)
Eosinophils Relative: 1 %
HEMATOCRIT: 30 % — AB (ref 36.0–46.0)
HEMOGLOBIN: 9.5 g/dL — AB (ref 12.0–15.0)
Lymphocytes Relative: 20 %
Lymphs Abs: 1.4 10*3/uL (ref 0.7–4.0)
MCH: 28.5 pg (ref 26.0–34.0)
MCHC: 31.7 g/dL (ref 30.0–36.0)
MCV: 90.1 fL (ref 78.0–100.0)
MONO ABS: 0.7 10*3/uL (ref 0.1–1.0)
MONOS PCT: 10 %
NEUTROS ABS: 5 10*3/uL (ref 1.7–7.7)
NEUTROS PCT: 69 %
Platelets: 203 10*3/uL (ref 150–400)
RBC: 3.33 MIL/uL — ABNORMAL LOW (ref 3.87–5.11)
RDW: 14.6 % (ref 11.5–15.5)
WBC: 7.3 10*3/uL (ref 4.0–10.5)

## 2016-12-16 LAB — FERRITIN: FERRITIN: 18 ng/mL (ref 11–307)

## 2016-12-16 MED ORDER — SODIUM CHLORIDE 0.9 % IV SOLN
Freq: Once | INTRAVENOUS | Status: AC
  Administered 2016-12-16: 15:00:00 via INTRAVENOUS

## 2016-12-16 MED ORDER — SODIUM CHLORIDE 0.9 % IV SOLN
510.0000 mg | Freq: Once | INTRAVENOUS | Status: AC
  Administered 2016-12-16: 510 mg via INTRAVENOUS
  Filled 2016-12-16: qty 17

## 2016-12-16 NOTE — Assessment & Plan Note (Addendum)
Iron deficiency anemia secondary to chronic GI blood loss, secondary to AVM and chronic blood loss requiring IV iron replacement every 3 months.    Labs today as planned: CBC diff, ferritin.   I personally reviewed and went over laboratory results with the patient.  The results are noted within this dictation.   IV Feraheme 510 mg today.  Based upon iron defict calculation today/tomorrow will determine the role for day 8 feraheme.    Labs in 3 months: CBC diff, BMET, iron/TIBC, ferritin.    Supportive therapy plan is reviewed.  Given the patient's difficulty getting to the clinic, we'll perform laboratory work and IV iron replacement the same day. The utility of the day 8 will be determined based upon iron deficit calculation following.   Due to her B/L cerumen impaction of ears and decrease in hearing, will refer patient to ENT for evaluation and management.  Return in 3 months for follow-up.

## 2016-12-16 NOTE — Patient Instructions (Signed)
[facility] at [facility] Discharge Instructions  RECOMMENDATIONS MADE BY THE CONSULTANT AND ANY TEST RESULTS WILL BE SENT TO YOUR REFERRING PHYSICIAN.  You were seen today by Tom Kefalas PA-C.   Thank you for choosing [facility] at [facility] to provide your oncology and hematology care.  To afford each patient quality time with our provider, please arrive at least 15 minutes before your scheduled appointment time.    If you have a lab appointment with the Cancer Center please come in thru the  Main Entrance and check in at the main information desk  You need to re-schedule your appointment should you arrive 10 or more minutes late.  We strive to give you quality time with our providers, and arriving late affects you and other patients whose appointments are after yours.  Also, if you no show three or more times for appointments you may be dismissed from the clinic at the providers discretion.     Again, thank you for choosing [facility].  Our hope is that these requests will decrease the amount of time that you wait before being seen by our physicians.       _____________________________________________________________  Should you have questions after your visit to [facility], please contact our office at [phone] between the hours of 8:30 a.m. and 4:30 p.m.  Voicemails left after 4:30 p.m. will not be returned until the following business day.  For prescription refill requests, have your pharmacy contact our office.       Resources For Cancer Patients and their Caregivers ? American Cancer Society: Can assist with transportation, wigs, general needs, runs Look Good Feel Better.        1-888-227-6333 ? Cancer Care: Provides financial assistance, online support groups, medication/co-pay assistance.  1-800-813-HOPE (4673) ? [facility] Assists [place] cancer patients and their  families through emotional , educational and financial support.  336-427-4357 ? [place] DSS Where to apply for food stamps, Medicaid and utility assistance. 336-342-1394 ? RCATS: Transportation to medical appointments. 336-347-2287 ? Social Security Administration: May apply for disability if have a Stage IV cancer. 336-342-7796 1-800-772-1213 ? [place] Aging, Disability and Transit Services: Assists with nutrition, care and transit needs. 336-349-2343  Cancer Center Support Programs: @10RELATIVEDAYS@ > Cancer Support Group  2nd Tuesday of the month 1pm-2pm, Journey Room  > Creative Journey  3rd Tuesday of the month 1130am-1pm, Journey Room  > Look Good Feel Better  1st Wednesday of the month 10am-12 noon, Journey Room (Call [employer] to register 1-800-395-5775)    

## 2016-12-16 NOTE — Patient Instructions (Signed)
[facility] at [facility] Discharge Instructions  RECOMMENDATIONS MADE BY THE CONSULTANT AND ANY TEST RESULTS WILL BE SENT TO YOUR REFERRING PHYSICIAN.  Iron infusion today. Return as scheduled.   Thank you for choosing [facility] at [facility] to provide your oncology and hematology care.  To afford each patient quality time with our provider, please arrive at least 15 minutes before your scheduled appointment time.    If you have a lab appointment with the Cancer Center please come in thru the  Main Entrance and check in at the main information desk  You need to re-schedule your appointment should you arrive 10 or more minutes late.  We strive to give you quality time with our providers, and arriving late affects you and other patients whose appointments are after yours.  Also, if you no show three or more times for appointments you may be dismissed from the clinic at the providers discretion.     Again, thank you for choosing [facility].  Our hope is that these requests will decrease the amount of time that you wait before being seen by our physicians.       _____________________________________________________________  Should you have questions after your visit to [facility], please contact our office at [phone] between the hours of 8:30 a.m. and 4:30 p.m.  Voicemails left after 4:30 p.m. will not be returned until the following business day.  For prescription refill requests, have your pharmacy contact our office.       Resources For Cancer Patients and their Caregivers ? American Cancer Society: Can assist with transportation, wigs, general needs, runs Look Good Feel Better.        1-888-227-6333 ? Cancer Care: Provides financial assistance, online support groups, medication/co-pay assistance.  1-800-813-HOPE (4673) ? [facility] Assists [place] cancer patients and their  families through emotional , educational and financial support.  336-427-4357 ? [place] DSS Where to apply for food stamps, Medicaid and utility assistance. 336-342-1394 ? RCATS: Transportation to medical appointments. 336-347-2287 ? Social Security Administration: May apply for disability if have a Stage IV cancer. 336-342-7796 1-800-772-1213 ? [place] Aging, Disability and Transit Services: Assists with nutrition, care and transit needs. 336-349-2343  Cancer Center Support Programs: @10RELATIVEDAYS@ > Cancer Support Group  2nd Tuesday of the month 1pm-2pm, Journey Room  > Creative Journey  3rd Tuesday of the month 1130am-1pm, Journey Room  > Look Good Feel Better  1st Wednesday of the month 10am-12 noon, Journey Room (Call [employer] to register 1-800-395-5775)   

## 2016-12-16 NOTE — Progress Notes (Addendum)
Kara Bogus, MD [facility] [place] [address]  Other iron deficiency anemia - Plan: CBC with Differential, Basic metabolic panel, Ferritin, Iron and TIBC  CURRENT THERAPY: IV iron replacement with Feraheme every 12 weeks  INTERVAL HISTORY: Kara Santos 83 y.o. female returns for followup of Iron deficiency anemia secondary to chronic GI blood loss, secondary to AVM and chronic blood loss requiring IV iron replacement every 3 months.    She is doing well.  She denies any hematology complaints.  She denies any blood in stool or dark stools.  She is a resident at [facility].  As a result, she is accompanied by an Environmental consultant from the facility.    Kara Santos is in great spirits and her mood is excellent.    She notes a left leg pain.  She reports a decrease in hearing.  Review of Systems  Constitutional: Negative.  Negative for chills, fever and weight loss.  HENT: Positive for hearing loss.   Eyes: Negative.   Respiratory: Negative.  Negative for cough.   Cardiovascular: Negative.  Negative for chest pain.  Gastrointestinal: Negative.  Negative for blood in stool, constipation, diarrhea, melena, nausea and vomiting.  Genitourinary: Negative.   Musculoskeletal: Negative.   Skin: Negative.   Neurological: Negative.  Negative for weakness.  Endo/Heme/Allergies: Negative.   Psychiatric/Behavioral: Positive for memory loss.    Past Medical History:  Diagnosis Date  . Alzheimer's disease, early onset 18  . Anemia 11/07/2011   chronic IDA requiring multiple transfusions  . AVM (arteriovenous malformation) of stomach, acquired with hemorrhage 12/04/2013  . Closed fracture of greater trochanter of femur ([place]) 02/2013   left  . Depression   . Diabetes mellitus   . Fall at home 11/17/14   multiple rib fractures  . GERD (gastroesophageal reflux disease)   . Hypothyroidism    s/p thyroid surgery for thyroid cancer  . Iron deficiency  anemia 05/05/2012    Past Surgical History:  Procedure Laterality Date  . APPENDECTOMY    . bilateral cataract extractions    . BREAST REDUCTION SURGERY    . CHOLECYSTECTOMY    . COLONOSCOPY  08/2006   Dr. Cecelia Byars sided diverticulosis  . double balloon enteroscopy  06/1008   [facility]-->APC of 12 SB AVMs in jejunum. 6 feet of jejunum inspected  . ESOPHAGOGASTRODUODENOSCOPY  08/2006   Dr. Trevor Iha [facility], large duodenal diverticulum  . Givens small bowel capsule  08/2006   few gastric erosions, numerous small bowel AVMs with SB erosions  . right shoulder surgery    . THYROID SURGERY     thyroid cancer    Family History  Problem Relation Age of Onset  . Cancer Mother   . Cancer Father   . Diabetes Father   . Diabetes Sister   . Cancer Brother     Social History   Social History  . Marital status: Widowed    Spouse name: N/A  . Number of children: N/A  . Years of education: N/A   Social History Main Topics  . Smoking status: Never Smoker  . Smokeless tobacco: Never Used  . Alcohol use No  . Drug use: No  . Sexual activity: No   Other Topics Concern  . None   Social History Narrative  . None     PHYSICAL EXAMINATION  ECOG PERFORMANCE STATUS: 2 - Symptomatic, <50% confined to bed  Vitals:   12/16/16 1425  BP: 127/90  Pulse: (!) 112  Resp: 18  Temp: 98 F (36.7 C)    GENERAL:alert, no distress, comfortable, cooperative, smiling and accompanied by nursing assistant. SKIN: skin color, texture, turgor are normal, no rashes or significant lesions HEAD: Normocephalic, No masses, lesions, tenderness or abnormalities EYES: normal, EOMI EARS: External ears normal.  B/L cerumen impaction without visualization of TM. OROPHARYNX:lips, buccal mucosa, and tongue normal and mucous membranes are moist  NECK: supple, trachea midline LYMPH:  not examined BREAST:not examined LUNGS: clear to auscultation  HEART: regular rate & rhythm ABDOMEN:abdomen soft and  normal bowel sounds BACK: Back symmetric, no curvature. EXTREMITIES:less then 2 second capillary refill, no joint deformities, effusion, or inflammation, no skin discoloration, no cyanosis  NEURO: alert & oriented x 3 with fluent speech, dementia.   LABORATORY DATA: CBC    Component Value Date/Time   WBC 7.3 12/16/2016 1349   RBC 3.33 (L) 12/16/2016 1349   HGB 9.5 (L) 12/16/2016 1349   HCT 30.0 (L) 12/16/2016 1349   PLT 203 12/16/2016 1349   MCV 90.1 12/16/2016 1349   MCH 28.5 12/16/2016 1349   MCHC 31.7 12/16/2016 1349   RDW 14.6 12/16/2016 1349   LYMPHSABS 1.4 12/16/2016 1349   MONOABS 0.7 12/16/2016 1349   EOSABS 0.1 12/16/2016 1349   BASOSABS 0.0 12/16/2016 1349      Chemistry      Component Value Date/Time   NA 136 12/16/2016 1349   K 3.9 12/16/2016 1349   CL 99 (L) 12/16/2016 1349   CO2 24 12/16/2016 1349   BUN 24 (H) 12/16/2016 1349   CREATININE 0.97 12/16/2016 1349      Component Value Date/Time   CALCIUM 8.8 (L) 12/16/2016 1349   ALKPHOS 64 12/16/2016 1349   AST 30 12/16/2016 1349   ALT 19 12/16/2016 1349   BILITOT 0.4 12/16/2016 1349     Lab Results  Component Value Date   IRON 77 09/14/2013   TIBC 228 (L) 09/14/2013   FERRITIN 23 09/10/2016     PENDING LABS:   RADIOGRAPHIC STUDIES:  No results found.   PATHOLOGY:    ASSESSMENT AND PLAN:  Iron deficiency anemia Iron deficiency anemia secondary to chronic GI blood loss, secondary to AVM and chronic blood loss requiring IV iron replacement every 3 months.    Labs today as planned: CBC diff, ferritin.   I personally reviewed and went over laboratory results with the patient.  The results are noted within this dictation.   IV Feraheme 510 mg today.  Based upon iron defict calculation today/tomorrow will determine the role for day 8 feraheme.    Labs in 3 months: CBC diff, BMET, iron/TIBC, ferritin.    Supportive therapy plan is reviewed.  Given the patient's difficulty getting to the  clinic, we'll perform laboratory work and IV iron replacement the same day. The utility of the day 8 will be determined based upon iron deficit calculation following.   Due to her B/L cerumen impaction of ears and decrease in hearing, will refer patient to ENT for evaluation and management.  Return in 3 months for follow-up.   ORDERS PLACED FOR THIS ENCOUNTER: Orders Placed This Encounter  Procedures  . CBC with Differential  . Basic metabolic panel  . Ferritin  . Iron and TIBC    MEDICATIONS PRESCRIBED THIS ENCOUNTER: No orders of the defined types were placed in this encounter.   THERAPY PLAN:  Continue supportive therapy plan as outlined above.  All questions were answered. The patient knows  to call the clinic with any problems, questions or concerns. We can certainly see the patient much sooner if necessary.  Patient and plan discussed with Dr. Twana First and she is in agreement with the aforementioned.   This note is electronically signed by: Doy Mince 12/16/2016 8:21 PM

## 2016-12-17 ENCOUNTER — Encounter (HOSPITAL_COMMUNITY): Payer: Self-pay | Admitting: Oncology

## 2016-12-17 ENCOUNTER — Encounter (HOSPITAL_COMMUNITY): Payer: Self-pay | Admitting: Lab

## 2016-12-17 ENCOUNTER — Other Ambulatory Visit (HOSPITAL_COMMUNITY): Payer: Self-pay | Admitting: Oncology

## 2016-12-17 NOTE — Progress Notes (Unsigned)
Referral to Dr. Benjamine Mola.  Appt 01/01/17 @ 1:30 ([place] office).  Contacted pt's daughter, Creola Corn w/appt details.

## 2016-12-25 ENCOUNTER — Encounter (HOSPITAL_BASED_OUTPATIENT_CLINIC_OR_DEPARTMENT_OTHER): Payer: Medicare Other

## 2016-12-25 ENCOUNTER — Encounter (HOSPITAL_COMMUNITY): Payer: Self-pay

## 2016-12-25 VITALS — BP 115/61 | HR 96 | Temp 97.8°F | Resp 18

## 2016-12-25 DIAGNOSIS — D500 Iron deficiency anemia secondary to blood loss (chronic): Secondary | ICD-10-CM

## 2016-12-25 DIAGNOSIS — D509 Iron deficiency anemia, unspecified: Secondary | ICD-10-CM

## 2016-12-25 DIAGNOSIS — K31811 Angiodysplasia of stomach and duodenum with bleeding: Secondary | ICD-10-CM

## 2016-12-25 DIAGNOSIS — K922 Gastrointestinal hemorrhage, unspecified: Secondary | ICD-10-CM

## 2016-12-25 MED ORDER — SODIUM CHLORIDE 0.9 % IV SOLN
510.0000 mg | Freq: Once | INTRAVENOUS | Status: AC
  Administered 2016-12-25: 510 mg via INTRAVENOUS
  Filled 2016-12-25: qty 17

## 2016-12-25 MED ORDER — SODIUM CHLORIDE 0.9 % IV SOLN
Freq: Once | INTRAVENOUS | Status: AC
  Administered 2016-12-25: 15:00:00 via INTRAVENOUS

## 2016-12-25 NOTE — Progress Notes (Signed)
Feraheme 510mg  given today per orders. Patient tolerated it well. Vitals stable, discharged home from clinic today via walker. Follow [place] as scheduled.

## 2016-12-25 NOTE — Patient Instructions (Signed)
[place] at [facility] Discharge Instructions  RECOMMENDATIONS MADE BY THE CONSULTANT AND ANY TEST RESULTS WILL BE SENT TO YOUR REFERRING PHYSICIAN.  Iron given today Gave patient new schedule Follow up as scheduled.  Thank you for choosing [place] at [facility] to provide your oncology and hematology care.  To afford each patient quality time with our provider, please arrive at least 15 minutes before your scheduled appointment time.    If you have a lab appointment with the [facility] please come in thru the  Main Entrance and check in at the main information desk  You need to re-schedule your appointment should you arrive 10 or more minutes late.  We strive to give you quality time with our providers, and arriving late affects you and other patients whose appointments are after yours.  Also, if you no show three or more times for appointments you may be dismissed from the clinic at the providers discretion.     Again, thank you for choosing [facility].  Our hope is that these requests will decrease the amount of time that you wait before being seen by our physicians.       _____________________________________________________________  Should you have questions after your visit to [facility], please contact our office at [phone] between the hours of 8:30 a.m. and 4:30 p.m.  Voicemails left after 4:30 p.m. will not be returned until the following business day.  For prescription refill requests, have your pharmacy contact our office.       Resources For Cancer Patients and their Caregivers ? American Cancer Society: Can assist with transportation, wigs, general needs, runs Look Good Feel Better.        (618)781-9059 ? Cancer Care: Provides financial assistance, online support groups, medication/co-pay assistance.  1-800-813-HOPE 580-284-4192) ? [place] Assists [place]  cancer patients and their families through emotional , educational and financial support.  3031079402 ? [place] DSS Where to apply for food stamps, Medicaid and utility assistance. 514-257-5871 ? RCATS: Transportation to medical appointments. (929)399-8262 ? Social Security Administration: May apply for disability if have a Stage IV cancer. (705) 882-6416 212 800 5692 ? [employer], Disability and Transit Services: Assists with nutrition, care and transit needs. [place] Support Programs: @10RELATIVEDAYS @ > Cancer Support Group  2nd Tuesday of the month 1pm-2pm, Journey Room  > Creative Journey  3rd Tuesday of the month 1130am-1pm, Journey Room  > Look Good Feel Better  1st Wednesday of the month 10am-12 noon, Journey Room (Call [facility] to register [phone])

## 2016-12-30 DIAGNOSIS — M79675 Pain in left toe(s): Secondary | ICD-10-CM | POA: Diagnosis not present

## 2016-12-30 DIAGNOSIS — M79674 Pain in right toe(s): Secondary | ICD-10-CM | POA: Diagnosis not present

## 2016-12-30 DIAGNOSIS — B351 Tinea unguium: Secondary | ICD-10-CM | POA: Diagnosis not present

## 2017-01-01 ENCOUNTER — Ambulatory Visit (INDEPENDENT_AMBULATORY_CARE_PROVIDER_SITE_OTHER): Payer: Medicare Other | Admitting: Otolaryngology

## 2017-03-17 ENCOUNTER — Encounter (HOSPITAL_COMMUNITY): Payer: Medicare Other

## 2017-03-17 ENCOUNTER — Encounter (HOSPITAL_BASED_OUTPATIENT_CLINIC_OR_DEPARTMENT_OTHER): Payer: Medicare Other | Admitting: Oncology

## 2017-03-17 ENCOUNTER — Ambulatory Visit (HOSPITAL_COMMUNITY): Payer: Medicare Other | Admitting: Oncology

## 2017-03-17 ENCOUNTER — Encounter (HOSPITAL_COMMUNITY): Payer: Self-pay | Admitting: Oncology

## 2017-03-17 ENCOUNTER — Encounter (HOSPITAL_COMMUNITY): Payer: Medicare Other | Attending: Oncology

## 2017-03-17 VITALS — BP 122/50 | HR 101 | Temp 98.5°F | Resp 18 | Wt 92.5 lb

## 2017-03-17 DIAGNOSIS — Z85850 Personal history of malignant neoplasm of thyroid: Secondary | ICD-10-CM | POA: Diagnosis not present

## 2017-03-17 DIAGNOSIS — G309 Alzheimer's disease, unspecified: Secondary | ICD-10-CM | POA: Insufficient documentation

## 2017-03-17 DIAGNOSIS — B351 Tinea unguium: Secondary | ICD-10-CM | POA: Diagnosis not present

## 2017-03-17 DIAGNOSIS — K922 Gastrointestinal hemorrhage, unspecified: Secondary | ICD-10-CM

## 2017-03-17 DIAGNOSIS — K31811 Angiodysplasia of stomach and duodenum with bleeding: Secondary | ICD-10-CM

## 2017-03-17 DIAGNOSIS — K219 Gastro-esophageal reflux disease without esophagitis: Secondary | ICD-10-CM | POA: Diagnosis not present

## 2017-03-17 DIAGNOSIS — E119 Type 2 diabetes mellitus without complications: Secondary | ICD-10-CM | POA: Insufficient documentation

## 2017-03-17 DIAGNOSIS — D509 Iron deficiency anemia, unspecified: Secondary | ICD-10-CM

## 2017-03-17 DIAGNOSIS — D508 Other iron deficiency anemias: Secondary | ICD-10-CM

## 2017-03-17 DIAGNOSIS — M79675 Pain in left toe(s): Secondary | ICD-10-CM | POA: Diagnosis not present

## 2017-03-17 DIAGNOSIS — M79674 Pain in right toe(s): Secondary | ICD-10-CM | POA: Diagnosis not present

## 2017-03-17 DIAGNOSIS — E039 Hypothyroidism, unspecified: Secondary | ICD-10-CM | POA: Diagnosis not present

## 2017-03-17 DIAGNOSIS — F329 Major depressive disorder, single episode, unspecified: Secondary | ICD-10-CM | POA: Insufficient documentation

## 2017-03-17 DIAGNOSIS — D500 Iron deficiency anemia secondary to blood loss (chronic): Secondary | ICD-10-CM | POA: Diagnosis not present

## 2017-03-17 LAB — IRON AND TIBC
IRON: 203 ug/dL — AB (ref 28–170)
Saturation Ratios: 55 % — ABNORMAL HIGH (ref 10.4–31.8)
TIBC: 367 ug/dL (ref 250–450)
UIBC: 164 ug/dL

## 2017-03-17 LAB — CBC WITH DIFFERENTIAL/PLATELET
BASOS PCT: 1 %
Basophils Absolute: 0 10*3/uL (ref 0.0–0.1)
Eosinophils Absolute: 0.1 10*3/uL (ref 0.0–0.7)
Eosinophils Relative: 1 %
HEMATOCRIT: 29.3 % — AB (ref 36.0–46.0)
Hemoglobin: 9.1 g/dL — ABNORMAL LOW (ref 12.0–15.0)
LYMPHS ABS: 1.3 10*3/uL (ref 0.7–4.0)
Lymphocytes Relative: 16 %
MCH: 29.2 pg (ref 26.0–34.0)
MCHC: 31.1 g/dL (ref 30.0–36.0)
MCV: 93.9 fL (ref 78.0–100.0)
MONO ABS: 0.7 10*3/uL (ref 0.1–1.0)
MONOS PCT: 9 %
NEUTROS ABS: 6.3 10*3/uL (ref 1.7–7.7)
Neutrophils Relative %: 73 %
Platelets: 206 10*3/uL (ref 150–400)
RBC: 3.12 MIL/uL — ABNORMAL LOW (ref 3.87–5.11)
RDW: 14 % (ref 11.5–15.5)
WBC: 8.4 10*3/uL (ref 4.0–10.5)

## 2017-03-17 LAB — FERRITIN: Ferritin: 21 ng/mL (ref 11–307)

## 2017-03-17 LAB — BASIC METABOLIC PANEL
Anion gap: 15 (ref 5–15)
BUN: 25 mg/dL — AB (ref 6–20)
CALCIUM: 8.8 mg/dL — AB (ref 8.9–10.3)
CO2: 25 mmol/L (ref 22–32)
CREATININE: 0.75 mg/dL (ref 0.44–1.00)
Chloride: 100 mmol/L — ABNORMAL LOW (ref 101–111)
GFR calc non Af Amer: >60 mL/min (ref >60)
GLUCOSE: 170 mg/dL — AB (ref 65–99)
Potassium: 4 mmol/L (ref 3.5–5.1)
Sodium: 140 mmol/L (ref 135–145)

## 2017-03-17 MED ORDER — SODIUM CHLORIDE 0.9 % IV SOLN
Freq: Once | INTRAVENOUS | Status: AC
  Administered 2017-03-17: 15:00:00 via INTRAVENOUS

## 2017-03-17 MED ORDER — FERUMOXYTOL INJECTION 510 MG/17 ML
510.0000 mg | Freq: Once | INTRAVENOUS | Status: AC
  Administered 2017-03-17: 510 mg via INTRAVENOUS
  Filled 2017-03-17: qty 17

## 2017-03-17 NOTE — Patient Instructions (Signed)
[facility] at [facility] Discharge Instructions  RECOMMENDATIONS MADE BY THE CONSULTANT AND ANY TEST RESULTS WILL BE SENT TO YOUR REFERRING PHYSICIAN.  You were seen today by Dr. Choksi. Return in 3 months for labs and follow up.   Thank you for choosing [facility] at [facility] to provide your oncology and hematology care.  To afford each patient quality time with our provider, please arrive at least 15 minutes before your scheduled appointment time.    If you have a lab appointment with the Cancer Center please come in thru the  Main Entrance and check in at the main information desk  You need to re-schedule your appointment should you arrive 10 or more minutes late.  We strive to give you quality time with our providers, and arriving late affects you and other patients whose appointments are after yours.  Also, if you no show three or more times for appointments you may be dismissed from the clinic at the providers discretion.     Again, thank you for choosing [facility].  Our hope is that these requests will decrease the amount of time that you wait before being seen by our physicians.       _____________________________________________________________  Should you have questions after your visit to  Cancer Center, please contact our office at [phone] between the hours of 8:30 a.m. and 4:30 p.m.  Voicemails left after 4:30 p.m. will not be returned until the following business day.  For prescription refill requests, have your pharmacy contact our office.       Resources For Cancer Patients and their Caregivers ? American Cancer Society: Can assist with transportation, wigs, general needs, runs Look Good Feel Better.        1-888-227-6333 ? Cancer Care: Provides financial assistance, online support groups, medication/co-pay assistance.  1-800-813-HOPE (4673) ? [facility] Assists  [place] cancer patients and their families through emotional , educational and financial support.  336-427-4357 ? [place] DSS Where to apply for food stamps, Medicaid and utility assistance. 336-342-1394 ? RCATS: Transportation to medical appointments. 336-347-2287 ? Social Security Administration: May apply for disability if have a Stage IV cancer. 336-342-7796 1-800-772-1213 ? [place] Aging, Disability and Transit Services: Assists with nutrition, care and transit needs. 336-349-2343  Cancer Center Support Programs: @10RELATIVEDAYS@ > Cancer Support Group  2nd Tuesday of the month 1pm-2pm, Journey Room  > Creative Journey  3rd Tuesday of the month 1130am-1pm, Journey Room  > Look Good Feel Better  1st Wednesday of the month 10am-12 noon, Journey Room (Call American Cancer Society to register 1-800-395-5775)    

## 2017-03-17 NOTE — Patient Instructions (Signed)
[facility] at [facility] Discharge Instructions  RECOMMENDATIONS MADE BY THE CONSULTANT AND ANY TEST RESULTS WILL BE SENT TO YOUR REFERRING PHYSICIAN.  feraheme given today Follow up as scheduled  Thank you for choosing [facility] at [facility] to provide your oncology and hematology care.  To afford each patient quality time with our provider, please arrive at least 15 minutes before your scheduled appointment time.    If you have a lab appointment with the Cancer Center please come in thru the  Main Entrance and check in at the main information desk  You need to re-schedule your appointment should you arrive 10 or more minutes late.  We strive to give you quality time with our providers, and arriving late affects you and other patients whose appointments are after yours.  Also, if you no show three or more times for appointments you may be dismissed from the clinic at the providers discretion.     Again, thank you for choosing [facility].  Our hope is that these requests will decrease the amount of time that you wait before being seen by our physicians.       _____________________________________________________________  Should you have questions after your visit to  Cancer Center, please contact our office at [phone] between the hours of 8:30 a.m. and 4:30 p.m.  Voicemails left after 4:30 p.m. will not be returned until the following business day.  For prescription refill requests, have your pharmacy contact our office.       Resources For Cancer Patients and their Caregivers ? American Cancer Society: Can assist with transportation, wigs, general needs, runs Look Good Feel Better.        1-888-227-6333 ? Cancer Care: Provides financial assistance, online support groups, medication/co-pay assistance.  1-800-813-HOPE (4673) ? [facility] Assists [place] cancer patients and  their families through emotional , educational and financial support.  336-427-4357 ? [place] DSS Where to apply for food stamps, Medicaid and utility assistance. 336-342-1394 ? RCATS: Transportation to medical appointments. 336-347-2287 ? Social Security Administration: May apply for disability if have a Stage IV cancer. 336-342-7796 1-800-772-1213 ? [place] Aging, Disability and Transit Services: Assists with nutrition, care and transit needs. 336-349-2343  Cancer Center Support Programs: @10RELATIVEDAYS@ > Cancer Support Group  2nd Tuesday of the month 1pm-2pm, Journey Room  > Creative Journey  3rd Tuesday of the month 1130am-1pm, Journey Room  > Look Good Feel Better  1st Wednesday of the month 10am-12 noon, Journey Room (Call American Cancer Society to register 1-800-395-5775)   

## 2017-03-17 NOTE — Progress Notes (Signed)
Kara Du, MD [address] [place] [address]  No diagnosis found.  CURRENT THERAPY: IV iron replacement with Feraheme every 12 weeks  INTERVAL HISTORY: Kara Santos 83 y.o. female returns for followup of Iron deficiency anemia secondary to chronic GI blood loss, secondary to AVM and chronic blood loss requiring IV iron replacement every 3 months.    She is doing well.  She denies any hematology complaints.  She denies any blood in stool or dark stools.  She is a resident at [facility].  As a result, she is accompanied by an Environmental consultant from the facility.    Kara Santos is in great spirits and her mood is excellent.    She notes a left leg pain.  She reports a decrease in hearing. 83 year old lady came today further follow-up. Essentially no change in her medical condition Patient does not report any bleeding Review of Systems  Constitutional: Negative.  Negative for chills, fever and weight loss.  HENT: Positive for hearing loss.   Eyes: Negative.   Respiratory: Negative.  Negative for cough.   Cardiovascular: Negative.  Negative for chest pain.  Gastrointestinal: Negative.  Negative for blood in stool, constipation, diarrhea, melena, nausea and vomiting.  Genitourinary: Negative.   Musculoskeletal: Negative.   Skin: Negative.   Neurological: Negative.  Negative for weakness.  Endo/Heme/Allergies: Negative.   Psychiatric/Behavioral: Positive for memory loss.    Past Medical History:  Diagnosis Date  . Alzheimer's disease, early onset 74  . Anemia 11/07/2011   chronic IDA requiring multiple transfusions  . AVM (arteriovenous malformation) of stomach, acquired with hemorrhage 12/04/2013  . Closed fracture of greater trochanter of femur ([place]) 02/2013   left  . Depression   . Diabetes mellitus   . Fall at home 11/17/14   multiple rib fractures  . GERD (gastroesophageal reflux disease)   . Hypothyroidism    s/p thyroid  surgery for thyroid cancer  . Iron deficiency anemia 05/05/2012    Past Surgical History:  Procedure Laterality Date  . APPENDECTOMY    . bilateral cataract extractions    . BREAST REDUCTION SURGERY    . CHOLECYSTECTOMY    . COLONOSCOPY  08/2006   Dr. Cecelia Byars sided diverticulosis  . double balloon enteroscopy  06/1008   [facility]-->APC of 12 SB AVMs in jejunum. 6 feet of jejunum inspected  . ESOPHAGOGASTRODUODENOSCOPY  08/2006   Dr. Trevor Iha [facility], large duodenal diverticulum  . Givens small bowel capsule  08/2006   few gastric erosions, numerous small bowel AVMs with SB erosions  . right shoulder surgery    . THYROID SURGERY     thyroid cancer    Family History  Problem Relation Age of Onset  . Cancer Mother   . Cancer Father   . Diabetes Father   . Diabetes Sister   . Cancer Brother     Social History   Social History  . Marital status: Widowed    Spouse name: N/A  . Number of children: N/A  . Years of education: N/A   Social History Main Topics  . Smoking status: Never Smoker  . Smokeless tobacco: Never Used  . Alcohol use No  . Drug use: No  . Sexual activity: No   Other Topics Concern  . Not on file   Social History Narrative  . No narrative on file     PHYSICAL EXAMINATION  ECOG PERFORMANCE STATUS: 2 - Symptomatic, <50% confined to bed  There were no vitals filed for this visit.  GENERAL:alert, no distress, comfortable, cooperative, smiling and accompanied by nursing assistant. SKIN: skin color, texture, turgor are normal, no rashes or significant lesions HEAD: Normocephalic, No masses, lesions, tenderness or abnormalities EYES: normal, EOMI EARS: External ears normal.  B/L cerumen impaction without visualization of TM. OROPHARYNX:lips, buccal mucosa, and tongue normal and mucous membranes are moist  NECK: supple, trachea midline LYMPH:  not examined BREAST:not examined LUNGS: clear to auscultation  HEART: regular rate &  rhythm ABDOMEN:abdomen soft and normal bowel sounds BACK: Back symmetric, no curvature. EXTREMITIES:less then 2 second capillary refill, no joint deformities, effusion, or inflammation, no skin discoloration, no cyanosis  NEURO: alert & oriented x 3 with fluent speech, dementia.   LABORATORY DATA: CBC    Component Value Date/Time   WBC 8.4 03/17/2017 1319   RBC 3.12 (L) 03/17/2017 1319   HGB 9.1 (L) 03/17/2017 1319   HCT 29.3 (L) 03/17/2017 1319   PLT 206 03/17/2017 1319   MCV 93.9 03/17/2017 1319   MCH 29.2 03/17/2017 1319   MCHC 31.1 03/17/2017 1319   RDW 14.0 03/17/2017 1319   LYMPHSABS 1.3 03/17/2017 1319   MONOABS 0.7 03/17/2017 1319   EOSABS 0.1 03/17/2017 1319   BASOSABS 0.0 03/17/2017 1319      Chemistry      Component Value Date/Time   NA 136 12/16/2016 1349   K 3.9 12/16/2016 1349   CL 99 (L) 12/16/2016 1349   CO2 24 12/16/2016 1349   BUN 24 (H) 12/16/2016 1349   CREATININE 0.97 12/16/2016 1349      Component Value Date/Time   CALCIUM 8.8 (L) 12/16/2016 1349   ALKPHOS 64 12/16/2016 1349   AST 30 12/16/2016 1349   ALT 19 12/16/2016 1349   BILITOT 0.4 12/16/2016 1349     Lab Results  Component Value Date   IRON 77 09/14/2013   TIBC 228 (L) 09/14/2013   FERRITIN 18 12/16/2016     PENDING LABS:   RADIOGRAPHIC STUDIES:  No results found.   PATHOLOGY:    ASSESSMENT AND PLAN:  Iron deficiency anemia due to chronic GI blood loss  Hemoglobin is stable Poor oral iron intolerance Ferritin is pending Continue intravenous iron today and reassessment in 3 months  ORDERS PLACED FOR THIS ENCOUNTER: Lab  and intravenous iron  MEDICATIONS PRESCRIBED THIS ENCOUNTER: Intravenous iron therapy  THERAPY PLAN:  Continue supportive therapy plan as outlined above.  All questions were answered. The patient knows to call the clinic with any problems, questions or concerns. We can certainly see the patient much sooner if necessary.  Patient and plan  discussed with Dr. Twana First and she is in agreement with the aforementioned.   This note is electronically signed by: Forest Gleason, MD 03/17/2017 2:10 PM

## 2017-03-17 NOTE — Progress Notes (Signed)
feraheme given today per orders. Patient tolerated it well. Vitals stable and discharged home from clinic with her walker.follow up as scheduled

## 2017-03-22 ENCOUNTER — Emergency Department (HOSPITAL_COMMUNITY)
Admission: EM | Admit: 2017-03-22 | Discharge: 2017-03-22 | Disposition: A | Discharge status: Home / Self Care | Payer: Medicare Other | Attending: Emergency Medicine | Admitting: Emergency Medicine

## 2017-03-22 ENCOUNTER — Encounter (HOSPITAL_COMMUNITY): Payer: Self-pay | Admitting: Emergency Medicine

## 2017-03-22 ENCOUNTER — Other Ambulatory Visit: Payer: Self-pay

## 2017-03-22 DIAGNOSIS — E119 Type 2 diabetes mellitus without complications: Secondary | ICD-10-CM | POA: Insufficient documentation

## 2017-03-22 DIAGNOSIS — Z7984 Long term (current) use of oral hypoglycemic drugs: Secondary | ICD-10-CM | POA: Insufficient documentation

## 2017-03-22 DIAGNOSIS — G308 Other Alzheimer's disease: Secondary | ICD-10-CM | POA: Diagnosis not present

## 2017-03-22 DIAGNOSIS — F0280 Dementia in other diseases classified elsewhere without behavioral disturbance: Secondary | ICD-10-CM | POA: Insufficient documentation

## 2017-03-22 DIAGNOSIS — Z79899 Other long term (current) drug therapy: Secondary | ICD-10-CM | POA: Insufficient documentation

## 2017-03-22 DIAGNOSIS — R531 Weakness: Secondary | ICD-10-CM | POA: Insufficient documentation

## 2017-03-22 DIAGNOSIS — E039 Hypothyroidism, unspecified: Secondary | ICD-10-CM | POA: Insufficient documentation

## 2017-03-22 DIAGNOSIS — Z85850 Personal history of malignant neoplasm of thyroid: Secondary | ICD-10-CM | POA: Diagnosis not present

## 2017-03-22 DIAGNOSIS — M6281 Muscle weakness (generalized): Secondary | ICD-10-CM | POA: Diagnosis not present

## 2017-03-22 DIAGNOSIS — R404 Transient alteration of awareness: Secondary | ICD-10-CM | POA: Diagnosis not present

## 2017-03-22 LAB — CBC
HEMATOCRIT: 31.9 % — AB (ref 36.0–46.0)
Hemoglobin: 9.9 g/dL — ABNORMAL LOW (ref 12.0–15.0)
MCH: 29.5 pg (ref 26.0–34.0)
MCHC: 31 g/dL (ref 30.0–36.0)
MCV: 94.9 fL (ref 78.0–100.0)
PLATELETS: 212 10*3/uL (ref 150–400)
RBC: 3.36 MIL/uL — AB (ref 3.87–5.11)
RDW: 15.2 % (ref 11.5–15.5)
WBC: 8.5 10*3/uL (ref 4.0–10.5)

## 2017-03-22 LAB — URINALYSIS, ROUTINE W REFLEX MICROSCOPIC
Bilirubin Urine: NEGATIVE
GLUCOSE, UA: NEGATIVE mg/dL
KETONES UR: NEGATIVE mg/dL
LEUKOCYTES UA: NEGATIVE
NITRITE: NEGATIVE
PH: 7 (ref 5.0–8.0)
PROTEIN: NEGATIVE mg/dL
Specific Gravity, Urine: 1.005 (ref 1.005–1.030)

## 2017-03-22 LAB — BASIC METABOLIC PANEL
Anion gap: 13 (ref 5–15)
BUN: 23 mg/dL — AB (ref 6–20)
CHLORIDE: 104 mmol/L (ref 101–111)
CO2: 26 mmol/L (ref 22–32)
CREATININE: 0.73 mg/dL (ref 0.44–1.00)
Calcium: 9.5 mg/dL (ref 8.9–10.3)
GFR calc Af Amer: >60 mL/min (ref >60)
Glucose, Bld: 158 mg/dL — ABNORMAL HIGH (ref 65–99)
POTASSIUM: 4.3 mmol/L (ref 3.5–5.1)
SODIUM: 143 mmol/L (ref 135–145)

## 2017-03-22 LAB — CBG MONITORING, ED
GLUCOSE-CAPILLARY: 182 mg/dL — AB (ref 65–99)
Glucose-Capillary: 153 mg/dL — ABNORMAL HIGH (ref 65–99)

## 2017-03-22 LAB — CK: Total CK: 47 U/L (ref 38–234)

## 2017-03-22 NOTE — ED Notes (Signed)
Patient tolerating PO fluid.

## 2017-03-22 NOTE — ED Triage Notes (Signed)
Pt sent from [facility] for evaluation of generalized weakness.  Pt is alert and oriented and states she just does not feel well.  States her head feels heavy and foolish.

## 2017-03-22 NOTE — ED Provider Notes (Signed)
[facility] DEPT Provider Note   CSN: [id number] Arrival date & time: 03/22/17  1200     History   Chief Complaint Chief Complaint  Patient presents with  . Weakness    HPI Kara Santos is a 83 y.o. female.  Patient sent here from her facility for evaluation of generalized weakness, and not feeling well.  She is a poor historian and only complains of a heavy sensation in her head and body.  Level 5 caveat-poor historian  HPI  Past Medical History:  Diagnosis Date  . Alzheimer's disease, early onset 65  . Anemia 11/07/2011   chronic IDA requiring multiple transfusions  . AVM (arteriovenous malformation) of stomach, acquired with hemorrhage 12/04/2013  . Closed fracture of greater trochanter of femur ([place]) 02/2013   left  . Depression   . Diabetes mellitus   . Fall at home 11/17/14   multiple rib fractures  . GERD (gastroesophageal reflux disease)   . Hypothyroidism    s/p thyroid surgery for thyroid cancer  . Iron deficiency anemia 05/05/2012    Patient Active Problem List   Diagnosis Date Noted  . GI bleed 08/07/2015  . Mandibular fracture, closed ([place]) 08/07/2015  . Acute encephalopathy 08/07/2015  . Hematochezia 08/07/2015  . Fall 11/20/2014  . Multiple rib fractures 11/17/2014  . AVM (arteriovenous malformation) of stomach, acquired with hemorrhage 12/04/2013  . Closed fracture of greater trochanter of femur ([place]) 02/06/2013  . History of fall 02/06/2013  . Iron deficiency anemia 05/05/2012  . Chronic gastrointestinal bleeding 03/04/2012  . Diabetes mellitus type 2 in nonobese ([place]) 03/04/2012  . Hypothyroidism 03/04/2012  . History of thyroid cancer 03/04/2012    Past Surgical History:  Procedure Laterality Date  . APPENDECTOMY    . bilateral cataract extractions    . BREAST REDUCTION SURGERY    . CHOLECYSTECTOMY    . COLONOSCOPY  08/2006   Dr. Cecelia Byars sided diverticulosis  . double balloon enteroscopy  06/1008   [facility]-->APC of 12 SB AVMs in  jejunum. 6 feet of jejunum inspected  . ESOPHAGOGASTRODUODENOSCOPY  08/2006   Dr. Trevor Iha [facility], large duodenal diverticulum  . Givens small bowel capsule  08/2006   few gastric erosions, numerous small bowel AVMs with SB erosions  . right shoulder surgery    . THYROID SURGERY     thyroid cancer    OB History    No data available       Home Medications    Prior to Admission medications   Medication Sig Start Date End Date Taking? Authorizing Provider  acetaminophen (TYLENOL) 500 MG tablet Take 1,000 mg by mouth 3 (three) times daily.   Yes [provider]  benzocaine-resorcinol (VAGISIL) 5-2 % vaginal cream Place 1 Applicatorful vaginally daily as needed for itching or irritation.   Yes [provider]  cyanocobalamin 100 MCG tablet Take 100 mcg by mouth daily.   Yes [provider]  ferrous sulfate 325 (65 FE) MG tablet Take 325 mg by mouth daily with breakfast.   Yes [provider]  furosemide (LASIX) 40 MG tablet  08/19/16  Yes [provider]  guaiFENesin-dextromethorphan (ROBITUSSIN DM) 100-10 MG/5ML syrup Take 10 mLs by mouth every 6 (six) hours as needed for cough.   Yes [provider]  levothyroxine (SYNTHROID, LEVOTHROID) 100 MCG tablet Take 100 mcg by mouth daily before breakfast.   Yes [provider]  metFORMIN (GLUCOPHAGE-XR) 500 MG 24 hr tablet Take 500 mg by mouth daily with breakfast.  Yes [provider]  Multiple Vitamin (MULTIVITAMIN) tablet Take 1 tablet by mouth daily.   Yes [provider]  pantoprazole (PROTONIX) 40 MG tablet Take 40 mg by mouth daily.   Yes [provider]  traMADol (ULTRAM) 50 MG tablet Take 50 mg by mouth every 6 (six) hours as needed for moderate pain. Pain not relieved by Tylenol.   Yes [provider]    Family History Family History  Problem Relation Age of Onset  . Cancer Mother   . Cancer Father   . Diabetes Father   .  Diabetes Sister   . Cancer Brother     Social History Social History  Substance Use Topics  . Smoking status: Never Smoker  . Smokeless tobacco: Never Used  . Alcohol use No     Allergies   Asa [aspirin]   Review of Systems Review of Systems  Unable to perform ROS: Dementia     Physical Exam Updated Vital Signs BP (!) 119/58   Pulse 95   Temp 97.8 F (36.6 C) (Oral)   Resp 18   Ht 5' (1.524 m)   Wt 42.2 kg (93 lb)   SpO2 100%   BMI 18.16 kg/m   Physical Exam  Constitutional: She appears well-developed. No distress.  Elderly, frail  HENT:  Head: Normocephalic and atraumatic.  Eyes: Conjunctivae and EOM are normal. Pupils are equal, round, and reactive to light.  Neck: Normal range of motion and phonation normal. Neck supple.  Cardiovascular: Normal rate and regular rhythm.   Pulmonary/Chest: Effort normal and breath sounds normal. She exhibits no tenderness.  Abdominal: Soft. She exhibits no distension. There is no tenderness. There is no guarding.  Musculoskeletal: Normal range of motion.  Mild edema left ankle and foot, no edema of the left pretibial space.  Normal range of motion arms and legs bilaterally.  Symmetric strength bilaterally.  Neurological: She is alert. She exhibits normal muscle tone.  Oriented to person and place.  No dysarthria or aphasia.  Skin: Skin is warm and dry.  Psychiatric: She has a normal mood and affect. Her behavior is normal.  Poor historian.  Pleasant, interactive.  Nursing note and vitals reviewed.    ED Treatments / Results  Labs (all labs ordered are listed, but only abnormal results are displayed) Labs Reviewed  BASIC METABOLIC PANEL - Abnormal; Notable for the following:       Result Value   Glucose, Bld 158 (*)    BUN 23 (*)    All other components within normal limits  CBC - Abnormal; Notable for the following:    RBC 3.36 (*)    Hemoglobin 9.9 (*)    HCT 31.9 (*)    All other components within normal limits   URINALYSIS, ROUTINE W REFLEX MICROSCOPIC - Abnormal; Notable for the following:    Color, Urine STRAW (*)    Hgb urine dipstick LARGE (*)    Bacteria, UA RARE (*)    Squamous Epithelial / LPF 0-5 (*)    All other components within normal limits  CBG MONITORING, ED - Abnormal; Notable for the following:    Glucose-Capillary 182 (*)    All other components within normal limits  CBG MONITORING, ED - Abnormal; Notable for the following:    Glucose-Capillary 153 (*)    All other components within normal limits  CK    EKG  EKG Interpretation  Date/Time:  Sunday March 22 2017 12:02:19 EDT Ventricular Rate:  87 PR  Interval:    QRS Duration: 80 QT Interval:  362 QTC Calculation: 436 R Axis:   64 Text Interpretation:  Sinus rhythm since last tracing no significant change Confirmed by Daleen Bo [phone]) on 03/22/2017 12:09:22 PM       Radiology No results found.  Procedures Procedures (including critical care time)  Medications Ordered in ED Medications - No data to display   Initial Impression / Assessment and Plan / ED Course  I have reviewed the triage vital signs and the nursing notes.  Pertinent labs & imaging results that were available during my care of the patient were reviewed by me and considered in my medical decision making (see chart for details).      Patient Vitals for the past 24 hrs:  BP Temp Temp src Pulse Resp SpO2 Height Weight  03/22/17 1500 (!) 119/58 - - - 18 - - -  03/22/17 1430 (!) 145/77 - - - (!) 22 - - -  03/22/17 1400 (!) 145/77 - - - (!) 30 - - -  03/22/17 1330 125/83 - - 95 20 100 % - -  03/22/17 1300 111/64 - - 89 20 100 % - -  03/22/17 1230 (!) 125/57 - - 84 18 100 % - -  03/22/17 1205 - - - 86 (!) 21 100 % - -  03/22/17 1204 139/68 97.8 F (36.6 C) Oral 88 18 100 % - -  03/22/17 1200 - - - - - - 5' (1.524 m) 42.2 kg (93 lb)    1:23 PM Reevaluation with update and discussion. After initial assessment and treatment, an updated  evaluation reveals at this time she is able to ambulate easily and has no further complaints.  Findings discussed and questions answered. Isair Inabinet L    Final Clinical Impressions(s) / ED Diagnoses   Final diagnoses:  Weakness    Nonspecific weakness and malaise, with reassuring evaluation.  Doubt serious bacterial infection metabolic instability or impending vascular collapse.   Nursing Notes Reviewed/ Care Coordinated Applicable Imaging Reviewed Interpretation of Laboratory Data incorporated into ED treatment  The patient appears reasonably screened and/or stabilized for discharge and I doubt any other medical condition or other [facility] requiring further screening, evaluation, or treatment in the ED at this time prior to discharge. p Plan: Home Medications-continue usual medications; Home Treatments-get plenty of rest, drink a lot of fluids and eat 3 meals each day; return here if the recommended treatment, does not improve the symptoms; Recommended follow up-PCP, 1 week, for checkup.   New Prescriptions Discharge Medication List as of 03/22/2017  3:21 PM       Daleen Bo, MD 03/22/17 2147

## 2017-03-22 NOTE — Discharge Instructions (Signed)
Testing today, was normal.  Continue taking her regular medications, and make sure you are eating and drinking well.  Follow up with your doctor if needed for problems.

## 2017-03-26 DIAGNOSIS — D500 Iron deficiency anemia secondary to blood loss (chronic): Secondary | ICD-10-CM | POA: Diagnosis not present

## 2017-03-26 DIAGNOSIS — I10 Essential (primary) hypertension: Secondary | ICD-10-CM | POA: Diagnosis not present

## 2017-03-26 DIAGNOSIS — F0390 Unspecified dementia without behavioral disturbance: Secondary | ICD-10-CM | POA: Diagnosis not present

## 2017-03-26 DIAGNOSIS — E119 Type 2 diabetes mellitus without complications: Secondary | ICD-10-CM | POA: Diagnosis not present

## 2017-05-28 DIAGNOSIS — E039 Hypothyroidism, unspecified: Secondary | ICD-10-CM | POA: Diagnosis not present

## 2017-05-28 DIAGNOSIS — I10 Essential (primary) hypertension: Secondary | ICD-10-CM | POA: Diagnosis not present

## 2017-05-28 DIAGNOSIS — F0390 Unspecified dementia without behavioral disturbance: Secondary | ICD-10-CM | POA: Diagnosis not present

## 2017-05-28 DIAGNOSIS — E119 Type 2 diabetes mellitus without complications: Secondary | ICD-10-CM | POA: Diagnosis not present

## 2017-06-05 ENCOUNTER — Other Ambulatory Visit (HOSPITAL_COMMUNITY): Payer: Self-pay | Admitting: *Deleted

## 2017-06-05 DIAGNOSIS — D508 Other iron deficiency anemias: Secondary | ICD-10-CM

## 2017-06-09 ENCOUNTER — Encounter (HOSPITAL_BASED_OUTPATIENT_CLINIC_OR_DEPARTMENT_OTHER): Payer: Medicare Other

## 2017-06-09 ENCOUNTER — Encounter (HOSPITAL_COMMUNITY): Payer: Self-pay | Admitting: Oncology

## 2017-06-09 ENCOUNTER — Encounter (HOSPITAL_BASED_OUTPATIENT_CLINIC_OR_DEPARTMENT_OTHER): Payer: Medicare Other | Admitting: Oncology

## 2017-06-09 ENCOUNTER — Encounter (HOSPITAL_COMMUNITY): Payer: Medicare Other | Attending: Oncology

## 2017-06-09 VITALS — BP 127/59 | HR 93 | Resp 18 | Ht 60.0 in | Wt 94.0 lb

## 2017-06-09 VITALS — BP 139/58 | HR 85 | Temp 98.4°F | Resp 16 | Wt 94.0 lb

## 2017-06-09 DIAGNOSIS — D500 Iron deficiency anemia secondary to blood loss (chronic): Secondary | ICD-10-CM | POA: Diagnosis not present

## 2017-06-09 DIAGNOSIS — K922 Gastrointestinal hemorrhage, unspecified: Secondary | ICD-10-CM

## 2017-06-09 DIAGNOSIS — E039 Hypothyroidism, unspecified: Secondary | ICD-10-CM | POA: Insufficient documentation

## 2017-06-09 DIAGNOSIS — D508 Other iron deficiency anemias: Secondary | ICD-10-CM

## 2017-06-09 DIAGNOSIS — E119 Type 2 diabetes mellitus without complications: Secondary | ICD-10-CM | POA: Diagnosis not present

## 2017-06-09 DIAGNOSIS — Z85850 Personal history of malignant neoplasm of thyroid: Secondary | ICD-10-CM | POA: Insufficient documentation

## 2017-06-09 DIAGNOSIS — K219 Gastro-esophageal reflux disease without esophagitis: Secondary | ICD-10-CM | POA: Diagnosis not present

## 2017-06-09 DIAGNOSIS — F329 Major depressive disorder, single episode, unspecified: Secondary | ICD-10-CM | POA: Diagnosis not present

## 2017-06-09 DIAGNOSIS — K31811 Angiodysplasia of stomach and duodenum with bleeding: Secondary | ICD-10-CM | POA: Diagnosis not present

## 2017-06-09 DIAGNOSIS — D509 Iron deficiency anemia, unspecified: Secondary | ICD-10-CM

## 2017-06-09 DIAGNOSIS — G309 Alzheimer's disease, unspecified: Secondary | ICD-10-CM | POA: Diagnosis not present

## 2017-06-09 LAB — COMPREHENSIVE METABOLIC PANEL
ALK PHOS: 56 U/L (ref 38–126)
ALT: 19 U/L (ref 14–54)
ANION GAP: 15 (ref 5–15)
AST: 36 U/L (ref 15–41)
Albumin: 4.2 g/dL (ref 3.5–5.0)
BILIRUBIN TOTAL: 0.4 mg/dL (ref 0.3–1.2)
BUN: 17 mg/dL (ref 6–20)
CALCIUM: 8.1 mg/dL — AB (ref 8.9–10.3)
CO2: 26 mmol/L (ref 22–32)
CREATININE: 0.86 mg/dL (ref 0.44–1.00)
Chloride: 100 mmol/L — ABNORMAL LOW (ref 101–111)
GFR calc Af Amer: >60 mL/min (ref >60)
GFR, EST NON AFRICAN AMERICAN: 54 mL/min — AB (ref >60)
Glucose, Bld: 165 mg/dL — ABNORMAL HIGH (ref 65–99)
Potassium: 3 mmol/L — ABNORMAL LOW (ref 3.5–5.1)
Sodium: 141 mmol/L (ref 135–145)
TOTAL PROTEIN: 6.8 g/dL (ref 6.5–8.1)

## 2017-06-09 LAB — CBC WITH DIFFERENTIAL/PLATELET
Basophils Absolute: 0.1 10*3/uL (ref 0.0–0.1)
Basophils Relative: 1 %
EOS ABS: 0.1 10*3/uL (ref 0.0–0.7)
Eosinophils Relative: 1 %
HEMATOCRIT: 30.1 % — AB (ref 36.0–46.0)
HEMOGLOBIN: 9.2 g/dL — AB (ref 12.0–15.0)
LYMPHS ABS: 1.6 10*3/uL (ref 0.7–4.0)
LYMPHS PCT: 17 %
MCH: 28.4 pg (ref 26.0–34.0)
MCHC: 30.6 g/dL (ref 30.0–36.0)
MCV: 92.9 fL (ref 78.0–100.0)
Monocytes Absolute: 0.7 10*3/uL (ref 0.1–1.0)
Monocytes Relative: 7 %
NEUTROS ABS: 7.1 10*3/uL (ref 1.7–7.7)
NEUTROS PCT: 74 %
Platelets: 222 10*3/uL (ref 150–400)
RBC: 3.24 MIL/uL — AB (ref 3.87–5.11)
RDW: 14.7 % (ref 11.5–15.5)
WBC: 9.6 10*3/uL (ref 4.0–10.5)

## 2017-06-09 LAB — IRON AND TIBC
IRON: 66 ug/dL (ref 28–170)
Saturation Ratios: 19 % (ref 10.4–31.8)
TIBC: 350 ug/dL (ref 250–450)
UIBC: 284 ug/dL

## 2017-06-09 LAB — FERRITIN: Ferritin: 15 ng/mL (ref 11–307)

## 2017-06-09 MED ORDER — FERUMOXYTOL INJECTION 510 MG/17 ML
510.0000 mg | Freq: Once | INTRAVENOUS | Status: AC
  Administered 2017-06-09: 510 mg via INTRAVENOUS
  Filled 2017-06-09: qty 17

## 2017-06-09 MED ORDER — SODIUM CHLORIDE 0.9 % IV SOLN
Freq: Once | INTRAVENOUS | Status: AC
  Administered 2017-06-09: 15:00:00 via INTRAVENOUS

## 2017-06-09 NOTE — Progress Notes (Signed)
Spoke with [name], Care Coordinator at [place], patient's place of residence.  Advised her of the infiltration and to help remind the patient to keep her arm elevated. She stated that she would also tell the other caregivers in her care.

## 2017-06-09 NOTE — Patient Instructions (Addendum)
[place] at [facility] Discharge Instructions  RECOMMENDATIONS MADE BY THE CONSULTANT AND ANY TEST RESULTS WILL BE SENT TO YOUR REFERRING PHYSICIAN.  You received your Feraheme infusion today.  Your infusion infiltrated therefore you have a large bruise to your right arm.  You should keep your right arm elevated for the next 24 hours.   Follow up as scheduled.  Thank you for choosing [place] at [facility] to provide your oncology and hematology care.  To afford each patient quality time with our provider, please arrive at least 15 minutes before your scheduled appointment time.    If you have a lab appointment with the [facility] please come in thru the  Main Entrance and check in at the main information desk  You need to re-schedule your appointment should you arrive 10 or more minutes late.  We strive to give you quality time with our providers, and arriving late affects you and other patients whose appointments are after yours.  Also, if you no show three or more times for appointments you may be dismissed from the clinic at the providers discretion.     Again, thank you for choosing [facility].  Our hope is that these requests will decrease the amount of time that you wait before being seen by our physicians.       _____________________________________________________________  Should you have questions after your visit to [facility], please contact our office at [phone] between the hours of 8:30 a.m. and 4:30 p.m.  Voicemails left after 4:30 p.m. will not be returned until the following business day.  For prescription refill requests, have your pharmacy contact our office.       Resources For Cancer Patients and their Caregivers ? American Cancer Society: Can assist with transportation, wigs, general needs, runs Look Good Feel Better.        ([phone] ? Cancer Care: Provides financial  assistance, online support groups, medication/co-pay assistance.  1-800-813-HOPE 6628281316) ? [place] Assists [place] cancer patients and their families through emotional , educational and financial support.  626-715-8291 ? [place] DSS Where to apply for food stamps, Medicaid and utility assistance. ([phone] 539-7026 ? RCATS: Transportation to medical appointments. 217-614-3452 ? Social Security Administration: May apply for disability if have a Stage IV cancer. 816-172-2499 [phone] ? [employer], Disability and Transit Services: Assists with nutrition, care and transit needs. [place] Support Programs: @10RELATIVEDAYS @ > Cancer Support Group  2nd Tuesday of the month 1pm-2pm, Journey Room  > Creative Journey  3rd Tuesday of the month 1130am-1pm, Journey Room  > Look Good Feel Better  1st Wednesday of the month 10am-12 noon, Journey Room (Call [place] to register [phone])

## 2017-06-09 NOTE — Progress Notes (Signed)
Patient tolerated infusion today. Patient's right arm swelling has decreased. Reminded patient to keep it elevated. Patient's family aware of keeping patient's arm elevated as well. They verbalize understanding.  Patient discharged ambulatory with walker. Patient discharged home with daughter in stable condition.  Follow up as scheduled.

## 2017-06-09 NOTE — Progress Notes (Signed)
At 1530 Feraheme was given per orders. Helped patient to bathroom. When we came back to the chair and sat down, noticed IV was leaking, pulled back her sleeve and noticed the IV had infiltrated. Immediately stopped iron infusion. Applied ice pack, elevated arm , notified Mike Craze NP. Will start another IV and proceed as ordered.

## 2017-06-09 NOTE — Progress Notes (Signed)
Sinda Du, MD [address] [place] [address]  No diagnosis found.  CURRENT THERAPY: IV iron replacement with Feraheme every 12 weeks  INTERVAL HISTORY: Kara Santos 83 y.o. female returns for followup of Iron deficiency anemia secondary to chronic GI blood loss, secondary to AVM and chronic blood loss requiring IV iron replacement every 3 months.    She presents today for feraheme treatment and for follow up. She states she has been doing well except for fatigue. She is a resident at [facility]. She is accompanied by her daughter [name] today. She denies any CP, SOB, abdominal pain, focal weakness.   Review of Systems  Constitutional: Negative.  Negative for chills, fever and weight loss.  HENT: Positive for hearing loss.   Eyes: Negative.   Respiratory: Negative.  Negative for cough.   Cardiovascular: Negative.  Negative for chest pain.  Gastrointestinal: Negative.  Negative for blood in stool, constipation, diarrhea, melena, nausea and vomiting.  Genitourinary: Negative.   Musculoskeletal: Negative.   Skin: Negative.   Neurological: Negative.  Negative for weakness.  Endo/Heme/Allergies: Negative.   Psychiatric/Behavioral: Positive for memory loss.    Past Medical History:  Diagnosis Date  . Alzheimer's disease, early onset 21  . Anemia 11/07/2011   chronic IDA requiring multiple transfusions  . AVM (arteriovenous malformation) of stomach, acquired with hemorrhage 12/04/2013  . Closed fracture of greater trochanter of femur ([place]) 02/2013   left  . Depression   . Diabetes mellitus   . Fall at home 11/17/14   multiple rib fractures  . GERD (gastroesophageal reflux disease)   . Hypothyroidism    s/p thyroid surgery for thyroid cancer  . Iron deficiency anemia 05/05/2012    Past Surgical History:  Procedure Laterality Date  . APPENDECTOMY    . bilateral cataract extractions    . BREAST REDUCTION SURGERY    .  CHOLECYSTECTOMY    . COLONOSCOPY  08/2006   Dr. Cecelia Byars sided diverticulosis  . double balloon enteroscopy  06/1008   [facility]-->APC of 12 SB AVMs in jejunum. 6 feet of jejunum inspected  . ESOPHAGOGASTRODUODENOSCOPY  08/2006   Dr. Trevor Iha hh, large duodenal diverticulum  . Givens small bowel capsule  08/2006   few gastric erosions, numerous small bowel AVMs with SB erosions  . right shoulder surgery    . THYROID SURGERY     thyroid cancer    Family History  Problem Relation Age of Onset  . Cancer Mother   . Cancer Father   . Diabetes Father   . Diabetes Sister   . Cancer Brother     Social History   Social History  . Marital status: Widowed    Spouse name: N/A  . Number of children: N/A  . Years of education: N/A   Social History Main Topics  . Smoking status: Never Smoker  . Smokeless tobacco: Never Used  . Alcohol use No  . Drug use: No  . Sexual activity: No   Other Topics Concern  . Not on file   Social History Narrative  . No narrative on file     PHYSICAL EXAMINATION  ECOG PERFORMANCE STATUS: 2 - Symptomatic, <50% confined to bed  Vitals:   06/09/17 1440  BP: (!) 127/59  Pulse: 93  Resp: 18  SpO2: 100%    Constitutional: Well-developed, well-nourished, and in no distress.   HENT:  Head: Normocephalic and atraumatic.  Mouth/Throat: No oropharyngeal exudate. Mucosa  moist. Eyes: Pupils are equal, round, and reactive to light. Conjunctivae are normal. No scleral icterus.  Neck: Normal range of motion. Neck supple. No JVD present.  Cardiovascular: Normal rate, regular rhythm and normal heart sounds.  Exam reveals no gallop and no friction rub.   No murmur heard. Pulmonary/Chest: Effort normal and breath sounds normal. No respiratory distress. No wheezes.No rales.  Abdominal: Soft. Bowel sounds are normal. No distension. There is no tenderness. There is no guarding.  Musculoskeletal: No edema or tenderness.  Lymphadenopathy:    No cervical  or supraclavicular adenopathy.  Neurological: Alert and oriented to person, place, and time. No cranial nerve deficit.  Skin: Skin is warm and dry. No rash noted. No erythema. No pallor.  Psychiatric: Affect and judgment normal.    LABORATORY DATA: CBC    Component Value Date/Time   WBC 9.6 06/09/2017 1341   RBC 3.24 (L) 06/09/2017 1341   HGB 9.2 (L) 06/09/2017 1341   HCT 30.1 (L) 06/09/2017 1341   PLT 222 06/09/2017 1341   MCV 92.9 06/09/2017 1341   MCH 28.4 06/09/2017 1341   MCHC 30.6 06/09/2017 1341   RDW 14.7 06/09/2017 1341   LYMPHSABS 1.6 06/09/2017 1341   MONOABS 0.7 06/09/2017 1341   EOSABS 0.1 06/09/2017 1341   BASOSABS 0.1 06/09/2017 1341      Chemistry      Component Value Date/Time   NA 141 06/09/2017 1341   K 3.0 (L) 06/09/2017 1341   CL 100 (L) 06/09/2017 1341   CO2 26 06/09/2017 1341   BUN 17 06/09/2017 1341   CREATININE 0.86 06/09/2017 1341      Component Value Date/Time   CALCIUM 8.1 (L) 06/09/2017 1341   ALKPHOS 56 06/09/2017 1341   AST 36 06/09/2017 1341   ALT 19 06/09/2017 1341   BILITOT 0.4 06/09/2017 1341     Lab Results  Component Value Date   IRON 203 (H) 03/17/2017   TIBC 367 03/17/2017   FERRITIN 21 03/17/2017        ASSESSMENT AND PLAN:  Iron deficiency anemia due to chronic GI blood loss.  PLAN: Hemoglobin 9.2 g/dL today. Proceed with feraheme today. RTC in 3 months for follow up with labs.  Orders Placed This Encounter  Procedures  . CBC with Differential    Standing Status:   Future    Standing Expiration Date:   06/09/2018  . Comprehensive metabolic panel    Standing Status:   Future    Standing Expiration Date:   06/09/2018  . Iron and TIBC    Standing Status:   Future    Standing Expiration Date:   06/09/2018  . Ferritin    Standing Status:   Future    Standing Expiration Date:   06/09/2018    This note is electronically signed by:   Twana First, MD 06/09/2017 2:47 PM

## 2017-06-09 NOTE — Pre-Procedure Instructions (Signed)
IV iron infiltration (R) forearm

## 2017-06-10 DIAGNOSIS — M79675 Pain in left toe(s): Secondary | ICD-10-CM | POA: Diagnosis not present

## 2017-06-10 DIAGNOSIS — B351 Tinea unguium: Secondary | ICD-10-CM | POA: Diagnosis not present

## 2017-06-10 DIAGNOSIS — M79674 Pain in right toe(s): Secondary | ICD-10-CM | POA: Diagnosis not present

## 2017-06-11 ENCOUNTER — Other Ambulatory Visit (HOSPITAL_COMMUNITY): Payer: Self-pay | Admitting: Oncology

## 2017-07-03 DIAGNOSIS — Z23 Encounter for immunization: Secondary | ICD-10-CM | POA: Diagnosis not present

## 2017-09-08 ENCOUNTER — Encounter (HOSPITAL_BASED_OUTPATIENT_CLINIC_OR_DEPARTMENT_OTHER): Payer: Medicare Other

## 2017-09-08 ENCOUNTER — Encounter (HOSPITAL_COMMUNITY): Payer: Self-pay | Admitting: Oncology

## 2017-09-08 ENCOUNTER — Encounter (HOSPITAL_COMMUNITY): Payer: Medicare Other | Attending: Oncology

## 2017-09-08 ENCOUNTER — Other Ambulatory Visit: Payer: Self-pay

## 2017-09-08 ENCOUNTER — Encounter (HOSPITAL_BASED_OUTPATIENT_CLINIC_OR_DEPARTMENT_OTHER): Payer: Medicare Other | Admitting: Oncology

## 2017-09-08 VITALS — BP 117/69 | HR 102 | Temp 98.1°F | Resp 18 | Wt 95.9 lb

## 2017-09-08 VITALS — BP 104/56 | HR 89 | Temp 97.7°F | Resp 18

## 2017-09-08 DIAGNOSIS — K922 Gastrointestinal hemorrhage, unspecified: Secondary | ICD-10-CM

## 2017-09-08 DIAGNOSIS — D509 Iron deficiency anemia, unspecified: Secondary | ICD-10-CM

## 2017-09-08 DIAGNOSIS — D500 Iron deficiency anemia secondary to blood loss (chronic): Secondary | ICD-10-CM | POA: Diagnosis not present

## 2017-09-08 DIAGNOSIS — K31811 Angiodysplasia of stomach and duodenum with bleeding: Secondary | ICD-10-CM | POA: Diagnosis not present

## 2017-09-08 DIAGNOSIS — D508 Other iron deficiency anemias: Secondary | ICD-10-CM

## 2017-09-08 LAB — COMPREHENSIVE METABOLIC PANEL
ALT: 19 U/L (ref 14–54)
ANION GAP: 12 (ref 5–15)
AST: 29 U/L (ref 15–41)
Albumin: 4.1 g/dL (ref 3.5–5.0)
Alkaline Phosphatase: 65 U/L (ref 38–126)
BILIRUBIN TOTAL: 0.6 mg/dL (ref 0.3–1.2)
BUN: 26 mg/dL — AB (ref 6–20)
CHLORIDE: 102 mmol/L (ref 101–111)
CO2: 26 mmol/L (ref 22–32)
Calcium: 8.4 mg/dL — ABNORMAL LOW (ref 8.9–10.3)
Creatinine, Ser: 0.98 mg/dL (ref 0.44–1.00)
GFR, EST AFRICAN AMERICAN: 54 mL/min — AB (ref >60)
GFR, EST NON AFRICAN AMERICAN: 46 mL/min — AB (ref >60)
Glucose, Bld: 153 mg/dL — ABNORMAL HIGH (ref 65–99)
POTASSIUM: 3.3 mmol/L — AB (ref 3.5–5.1)
Sodium: 140 mmol/L (ref 135–145)
TOTAL PROTEIN: 6.8 g/dL (ref 6.5–8.1)

## 2017-09-08 LAB — CBC WITH DIFFERENTIAL/PLATELET
BASOS ABS: 0 10*3/uL (ref 0.0–0.1)
Basophils Relative: 0 %
EOS PCT: 1 %
Eosinophils Absolute: 0.1 10*3/uL (ref 0.0–0.7)
HEMATOCRIT: 29.6 % — AB (ref 36.0–46.0)
Hemoglobin: 9.1 g/dL — ABNORMAL LOW (ref 12.0–15.0)
LYMPHS PCT: 19 %
Lymphs Abs: 1.9 10*3/uL (ref 0.7–4.0)
MCH: 28.6 pg (ref 26.0–34.0)
MCHC: 30.7 g/dL (ref 30.0–36.0)
MCV: 93.1 fL (ref 78.0–100.0)
MONO ABS: 0.9 10*3/uL (ref 0.1–1.0)
MONOS PCT: 8 %
NEUTROS ABS: 7.3 10*3/uL (ref 1.7–7.7)
Neutrophils Relative %: 72 %
PLATELETS: 250 10*3/uL (ref 150–400)
RBC: 3.18 MIL/uL — ABNORMAL LOW (ref 3.87–5.11)
RDW: 14.1 % (ref 11.5–15.5)
WBC: 10.2 10*3/uL (ref 4.0–10.5)

## 2017-09-08 MED ORDER — SODIUM CHLORIDE 0.9 % IV SOLN
Freq: Once | INTRAVENOUS | Status: AC
  Administered 2017-09-08: 14:00:00 via INTRAVENOUS

## 2017-09-08 MED ORDER — SODIUM CHLORIDE 0.9 % IV SOLN
510.0000 mg | Freq: Once | INTRAVENOUS | Status: AC
  Administered 2017-09-08: 510 mg via INTRAVENOUS
  Filled 2017-09-08: qty 17

## 2017-09-08 NOTE — Progress Notes (Signed)
Tolerated infusion w/o adverse reaction.  Alert, in no distress.  VSS.  Discharged ambulatory in c/o daughter.  

## 2017-09-08 NOTE — Patient Instructions (Signed)
[place] at [facility] Discharge Instructions  RECOMMENDATIONS MADE BY THE CONSULTANT AND ANY TEST RESULTS WILL BE SENT TO YOUR REFERRING PHYSICIAN.  You were seen today by Dr. Twana First You will get your iron infusion today Follow up in 3 months with labs  Thank you for choosing [place] at [facility] to provide your oncology and hematology care.  To afford each patient quality time with our provider, please arrive at least 15 minutes before your scheduled appointment time.    If you have a lab appointment with the [facility] please come in thru the  Main Entrance and check in at the main information desk  You need to re-schedule your appointment should you arrive 10 or more minutes late.  We strive to give you quality time with our providers, and arriving late affects you and other patients whose appointments are after yours.  Also, if you no show three or more times for appointments you may be dismissed from the clinic at the providers discretion.     Again, thank you for choosing [facility].  Our hope is that these requests will decrease the amount of time that you wait before being seen by our physicians.       _____________________________________________________________  Should you have questions after your visit to [facility], please contact our office at [phone] between the hours of 8:30 a.m. and 4:30 p.m.  Voicemails left after 4:30 p.m. will not be returned until the following business day.  For prescription refill requests, have your pharmacy contact our office.       Resources For Cancer Patients and their Caregivers ? American Cancer Society: Can assist with transportation, wigs, general needs, runs Look Good Feel Better.        ([phone] ? Cancer Care: Provides financial assistance, online support groups, medication/co-pay assistance.  1-800-813-HOPE (713) 251-6980) ? [place] Assists [place] cancer patients and their families through emotional , educational and financial support.  787-727-8481 ? [place] DSS Where to apply for food stamps, Medicaid and utility assistance. (978)112-2516 ? [employer]: Transportation to medical appointments. 208-454-6728 ? Social Security Administration: May apply for disability if have a Stage IV cancer. ([phone] 099-7285 6141774294 ? [employer], Disability and Transit Services: Assists with nutrition, care and transit needs. [place] Support Programs: @10RELATIVEDAYS @ > Cancer Support Group  2nd Tuesday of the month 1pm-2pm, Journey Room  > Creative Journey  3rd Tuesday of the month 1130am-1pm, Journey Room  > Look Good Feel Better  1st Wednesday of the month 10am-12 noon, Journey Room (Call [place] to register [phone])

## 2017-09-08 NOTE — Progress Notes (Signed)
Kara Du, MD [address] [place] [address]  Other iron deficiency anemia - Plan: CBC with Differential, Comprehensive metabolic panel, Iron and TIBC, Ferritin  CURRENT THERAPY: IV iron replacement with Feraheme every 12 weeks  INTERVAL HISTORY: Kara Santos 83 y.o. female returns for followup of Iron deficiency anemia secondary to chronic GI blood loss, secondary to AVM and chronic blood loss requiring IV iron replacement every 3 months.    She presents today for feraheme treatment and for follow up. She states she has been doing well. She is a resident at [facility]. She is accompanied by her daughter Kara Santos today. She denies any CP, SOB, abdominal pain, focal weakness. She states her only problems are hearing loss. She denies any blood in her stool or dark stools.   Review of Systems  Constitutional: Negative.  Negative for chills, fever and weight loss.  HENT: Positive for hearing loss.   Eyes: Negative.   Respiratory: Negative.  Negative for cough.   Cardiovascular: Negative.  Negative for chest pain.  Gastrointestinal: Negative.  Negative for blood in stool, constipation, diarrhea, melena, nausea and vomiting.  Genitourinary: Negative.   Musculoskeletal: Negative.   Skin: Negative.   Neurological: Negative.  Negative for weakness.  Endo/Heme/Allergies: Negative.   Psychiatric/Behavioral: Positive for memory loss.    Past Medical History:  Diagnosis Date  . Alzheimer's disease, early onset 8  . Anemia 11/07/2011   chronic IDA requiring multiple transfusions  . AVM (arteriovenous malformation) of stomach, acquired with hemorrhage 12/04/2013  . Closed fracture of greater trochanter of femur ([place]) 02/2013   left  . Depression   . Diabetes mellitus   . Fall at home 11/17/14   multiple rib fractures  . GERD (gastroesophageal reflux disease)   . Hypothyroidism    s/p thyroid surgery for thyroid cancer  . Iron deficiency  anemia 05/05/2012    Past Surgical History:  Procedure Laterality Date  . APPENDECTOMY    . bilateral cataract extractions    . BREAST REDUCTION SURGERY    . CHOLECYSTECTOMY    . COLONOSCOPY  08/2006   Dr. Cecelia Byars sided diverticulosis  . double balloon enteroscopy  06/1008   [facility]-->APC of 12 SB AVMs in jejunum. 6 feet of jejunum inspected  . ESOPHAGOGASTRODUODENOSCOPY  08/2006   Dr. Trevor Iha [facility], large duodenal diverticulum  . Givens small bowel capsule  08/2006   few gastric erosions, numerous small bowel AVMs with SB erosions  . right shoulder surgery    . THYROID SURGERY     thyroid cancer    Family History  Problem Relation Age of Onset  . Cancer Mother   . Cancer Father   . Diabetes Father   . Diabetes Sister   . Cancer Brother     Social History   Socioeconomic History  . Marital status: Widowed    Spouse name: None  . Number of children: None  . Years of education: None  . Highest education level: None  Social Needs  . Financial resource strain: None  . Food insecurity - worry: None  . Food insecurity - inability: None  . Transportation needs - medical: None  . Transportation needs - non-medical: None  Occupational History  . None  Tobacco Use  . Smoking status: Never Smoker  . Smokeless tobacco: Never Used  Substance and Sexual Activity  . Alcohol use: No  . Drug use: No  . Sexual activity: No  Other  Topics Concern  . None  Social History Narrative  . None     PHYSICAL EXAMINATION  ECOG PERFORMANCE STATUS: 2 - Symptomatic, <50% confined to bed  Vitals:   09/08/17 1327  BP: 117/69  Pulse: (!) 102  Resp: 18  Temp: 98.1 F (36.7 C)  SpO2: 100%    Constitutional: Well-developed, well-nourished, and in no distress.   HENT:  Head: Normocephalic and atraumatic.  Mouth/Throat: No oropharyngeal exudate. Mucosa moist. Eyes: Pupils are equal, round, and reactive to light. Conjunctivae are normal. No scleral icterus.  Neck: Normal  range of motion. Neck supple. No JVD present.  Cardiovascular: Normal rate, regular rhythm and normal heart sounds.  Exam reveals no gallop and no friction rub.   No murmur heard. Pulmonary/Chest: Effort normal and breath sounds normal. No respiratory distress. No wheezes.No rales.  Abdominal: Soft. Bowel sounds are normal. No distension. There is no tenderness. There is no guarding.  Musculoskeletal: No edema or tenderness.  Lymphadenopathy:    No cervical or supraclavicular adenopathy.  Neurological: Alert and oriented to person, place, and time. No cranial nerve deficit.  Skin: Skin is warm and dry. No rash noted. No erythema. No pallor.  Psychiatric: Affect and judgment normal.    LABORATORY DATA: CBC    Component Value Date/Time   WBC 10.2 09/08/2017 1300   RBC 3.18 (L) 09/08/2017 1300   HGB 9.1 (L) 09/08/2017 1300   HCT 29.6 (L) 09/08/2017 1300   PLT 250 09/08/2017 1300   MCV 93.1 09/08/2017 1300   MCH 28.6 09/08/2017 1300   MCHC 30.7 09/08/2017 1300   RDW 14.1 09/08/2017 1300   LYMPHSABS 1.9 09/08/2017 1300   MONOABS 0.9 09/08/2017 1300   EOSABS 0.1 09/08/2017 1300   BASOSABS 0.0 09/08/2017 1300      Chemistry      Component Value Date/Time   NA 141 06/09/2017 1341   K 3.0 (L) 06/09/2017 1341   CL 100 (L) 06/09/2017 1341   CO2 26 06/09/2017 1341   BUN 17 06/09/2017 1341   CREATININE 0.86 06/09/2017 1341      Component Value Date/Time   CALCIUM 8.1 (L) 06/09/2017 1341   ALKPHOS 56 06/09/2017 1341   AST 36 06/09/2017 1341   ALT 19 06/09/2017 1341   BILITOT 0.4 06/09/2017 1341     Lab Results  Component Value Date   IRON 66 06/09/2017   TIBC 350 06/09/2017   FERRITIN 15 06/09/2017        ASSESSMENT AND PLAN:  Iron deficiency anemia due to chronic GI blood loss.  PLAN: Hemoglobin 9.1 g/dL today. Proceed with feraheme today. RTC in 3 months for follow up with labs.  Orders Placed This Encounter  Procedures  . CBC with Differential    Standing  Status:   Future    Standing Expiration Date:   09/08/2018  . Comprehensive metabolic panel    Standing Status:   Future    Standing Expiration Date:   09/08/2018  . Iron and TIBC    Standing Status:   Future    Standing Expiration Date:   09/08/2018  . Ferritin    Standing Status:   Future    Standing Expiration Date:   09/08/2018    This note is electronically signed by:   Twana First, MD 09/08/2017 1:32 PM

## 2017-11-30 ENCOUNTER — Encounter (HOSPITAL_COMMUNITY): Payer: PRIVATE HEALTH INSURANCE

## 2017-12-01 DIAGNOSIS — I10 Essential (primary) hypertension: Secondary | ICD-10-CM | POA: Diagnosis not present

## 2017-12-01 DIAGNOSIS — F0390 Unspecified dementia without behavioral disturbance: Secondary | ICD-10-CM | POA: Diagnosis not present

## 2017-12-01 DIAGNOSIS — M1990 Unspecified osteoarthritis, unspecified site: Secondary | ICD-10-CM | POA: Diagnosis not present

## 2017-12-01 DIAGNOSIS — E039 Hypothyroidism, unspecified: Secondary | ICD-10-CM | POA: Diagnosis not present

## 2017-12-07 ENCOUNTER — Other Ambulatory Visit (HOSPITAL_COMMUNITY): Payer: PRIVATE HEALTH INSURANCE

## 2017-12-07 ENCOUNTER — Ambulatory Visit (HOSPITAL_COMMUNITY): Payer: PRIVATE HEALTH INSURANCE

## 2017-12-08 ENCOUNTER — Ambulatory Visit (HOSPITAL_COMMUNITY): Payer: PRIVATE HEALTH INSURANCE

## 2017-12-08 ENCOUNTER — Other Ambulatory Visit (HOSPITAL_COMMUNITY): Payer: PRIVATE HEALTH INSURANCE

## 2017-12-10 ENCOUNTER — Other Ambulatory Visit: Payer: Self-pay

## 2017-12-10 ENCOUNTER — Other Ambulatory Visit (HOSPITAL_COMMUNITY): Payer: PRIVATE HEALTH INSURANCE

## 2017-12-10 ENCOUNTER — Inpatient Hospital Stay (HOSPITAL_COMMUNITY): Payer: Medicare Other

## 2017-12-10 ENCOUNTER — Encounter (HOSPITAL_COMMUNITY): Payer: Self-pay

## 2017-12-10 ENCOUNTER — Encounter (HOSPITAL_COMMUNITY): Payer: Self-pay | Admitting: Internal Medicine

## 2017-12-10 ENCOUNTER — Inpatient Hospital Stay (HOSPITAL_COMMUNITY): Payer: Medicare Other | Attending: Internal Medicine | Admitting: Internal Medicine

## 2017-12-10 VITALS — BP 114/54 | HR 82 | Temp 97.6°F | Resp 16

## 2017-12-10 DIAGNOSIS — Q2733 Arteriovenous malformation of digestive system vessel: Secondary | ICD-10-CM | POA: Insufficient documentation

## 2017-12-10 DIAGNOSIS — D508 Other iron deficiency anemias: Secondary | ICD-10-CM

## 2017-12-10 DIAGNOSIS — E119 Type 2 diabetes mellitus without complications: Secondary | ICD-10-CM | POA: Diagnosis not present

## 2017-12-10 DIAGNOSIS — D500 Iron deficiency anemia secondary to blood loss (chronic): Secondary | ICD-10-CM | POA: Diagnosis not present

## 2017-12-10 DIAGNOSIS — K922 Gastrointestinal hemorrhage, unspecified: Secondary | ICD-10-CM

## 2017-12-10 DIAGNOSIS — E039 Hypothyroidism, unspecified: Secondary | ICD-10-CM | POA: Diagnosis not present

## 2017-12-10 DIAGNOSIS — K31811 Angiodysplasia of stomach and duodenum with bleeding: Secondary | ICD-10-CM

## 2017-12-10 DIAGNOSIS — D509 Iron deficiency anemia, unspecified: Secondary | ICD-10-CM

## 2017-12-10 LAB — COMPREHENSIVE METABOLIC PANEL
ALT: 21 U/L (ref 14–54)
ANION GAP: 16 — AB (ref 5–15)
AST: 34 U/L (ref 15–41)
Albumin: 4 g/dL (ref 3.5–5.0)
Alkaline Phosphatase: 86 U/L (ref 38–126)
BUN: 21 mg/dL — ABNORMAL HIGH (ref 6–20)
CALCIUM: 8.5 mg/dL — AB (ref 8.9–10.3)
CHLORIDE: 99 mmol/L — AB (ref 101–111)
CO2: 26 mmol/L (ref 22–32)
CREATININE: 0.75 mg/dL (ref 0.44–1.00)
Glucose, Bld: 162 mg/dL — ABNORMAL HIGH (ref 65–99)
Potassium: 3.4 mmol/L — ABNORMAL LOW (ref 3.5–5.1)
Sodium: 141 mmol/L (ref 135–145)
Total Bilirubin: 0.6 mg/dL (ref 0.3–1.2)
Total Protein: 7.2 g/dL (ref 6.5–8.1)

## 2017-12-10 LAB — CBC WITH DIFFERENTIAL/PLATELET
Basophils Absolute: 0.1 10*3/uL (ref 0.0–0.1)
Basophils Relative: 0 %
EOS ABS: 0.2 10*3/uL (ref 0.0–0.7)
EOS PCT: 2 %
HCT: 29.5 % — ABNORMAL LOW (ref 36.0–46.0)
Hemoglobin: 8.9 g/dL — ABNORMAL LOW (ref 12.0–15.0)
LYMPHS ABS: 1.6 10*3/uL (ref 0.7–4.0)
LYMPHS PCT: 13 %
MCH: 28.6 pg (ref 26.0–34.0)
MCHC: 30.2 g/dL (ref 30.0–36.0)
MCV: 94.9 fL (ref 78.0–100.0)
MONO ABS: 1.1 10*3/uL — AB (ref 0.1–1.0)
MONOS PCT: 9 %
Neutro Abs: 9.4 10*3/uL — ABNORMAL HIGH (ref 1.7–7.7)
Neutrophils Relative %: 76 %
PLATELETS: 250 10*3/uL (ref 150–400)
RBC: 3.11 MIL/uL — AB (ref 3.87–5.11)
RDW: 15.5 % (ref 11.5–15.5)
WBC: 12.3 10*3/uL — ABNORMAL HIGH (ref 4.0–10.5)

## 2017-12-10 LAB — IRON AND TIBC
IRON: 260 ug/dL — AB (ref 28–170)
SATURATION RATIOS: 85 % — AB (ref 10.4–31.8)
TIBC: 307 ug/dL (ref 250–450)
UIBC: 47 ug/dL

## 2017-12-10 LAB — FERRITIN: FERRITIN: 21 ng/mL (ref 11–307)

## 2017-12-10 MED ORDER — SODIUM CHLORIDE 0.9 % IV SOLN
510.0000 mg | Freq: Once | INTRAVENOUS | Status: AC
  Administered 2017-12-10: 510 mg via INTRAVENOUS
  Filled 2017-12-10: qty 17

## 2017-12-10 MED ORDER — SODIUM CHLORIDE 0.9 % IV SOLN
Freq: Once | INTRAVENOUS | Status: AC
  Administered 2017-12-10: 14:00:00 via INTRAVENOUS

## 2017-12-10 NOTE — Patient Instructions (Signed)
[facility] at [facility] Discharge Instructions  Received Feraheme infusion today. Follow-up as scheduled. Call clinic for any questions or concerns   Thank you for choosing [facility] at [facility] to provide your oncology and hematology care.  To afford each patient quality time with our provider, please arrive at least 15 minutes before your scheduled appointment time.   If you have a lab appointment with the Cancer Center please come in thru the  Main Entrance and check in at the main information desk  You need to re-schedule your appointment should you arrive 10 or more minutes late.  We strive to give you quality time with our providers, and arriving late affects you and other patients whose appointments are after yours.  Also, if you no show three or more times for appointments you may be dismissed from the clinic at the providers discretion.     Again, thank you for choosing [facility].  Our hope is that these requests will decrease the amount of time that you wait before being seen by our physicians.       _____________________________________________________________  Should you have questions after your visit to [facility], please contact our office at [phone] between the hours of 8:30 a.m. and 4:30 p.m.  Voicemails left after 4:30 p.m. will not be returned until the following business day.  For prescription refill requests, have your pharmacy contact our office.       Resources For Cancer Patients and their Caregivers ? American Cancer Society: Can assist with transportation, wigs, general needs, runs Look Good Feel Better.        1-888-227-6333 ? Cancer Care: Provides financial assistance, online support groups, medication/co-pay assistance.  1-800-813-HOPE (4673) ? [facility] Assists [place] cancer patients and their families through emotional , educational and  financial support.  336-427-4357 ? [place] DSS Where to apply for food stamps, Medicaid and utility assistance. 336-342-1394 ? RCATS: Transportation to medical appointments. 336-347-2287 ? Social Security Administration: May apply for disability if have a Stage IV cancer. 336-342-7796 1-800-772-1213 ? [place] Aging, Disability and Transit Services: Assists with nutrition, care and transit needs. 336-349-2343  Cancer Center Support Programs:   > Cancer Support Group  2nd Tuesday of the month 1pm-2pm, Journey Room   > Creative Journey  3rd Tuesday of the month 1130am-1pm, Journey Room    

## 2017-12-10 NOTE — Patient Instructions (Signed)
[facility] at [facility] Discharge Instructions   You were seen today by Dr. Zoila Shutter. Dr. Walden Field went over her labs the labs we have so far are not bad but some labs are still pending.    Thank you for choosing [place] at [facility] to provide your oncology and hematology care.  To afford each patient quality time with our provider, please arrive at least 15 minutes before your scheduled appointment time.    If you have a lab appointment with the [facility] please come in thru the  Main Entrance and check in at the main information desk  You need to re-schedule your appointment should you arrive 10 or more minutes late.  We strive to give you quality time with our providers, and arriving late affects you and other patients whose appointments are after yours.  Also, if you no show three or more times for appointments you may be dismissed from the clinic at the providers discretion.     Again, thank you for choosing [facility].  Our hope is that these requests will decrease the amount of time that you wait before being seen by our physicians.       _____________________________________________________________  Should you have questions after your visit to [facility], please contact our office at [phone] between the hours of 8:30 a.m. and 4:30 p.m.  Voicemails left after 4:30 p.m. will not be returned until the following business day.  For prescription refill requests, have your pharmacy contact our office.       Resources For Cancer Patients and their Caregivers ? American Cancer Society: Can assist with transportation, wigs, general needs, runs Look Good Feel Better.        480-711-1341 ? Cancer Care: Provides financial assistance, online support groups, medication/co-pay assistance.  1-800-813-HOPE 534-005-5302) ? [facility] Assists [place] cancer patients and their  families through emotional , educational and financial support.  573-229-7466 ? [place] DSS Where to apply for food stamps, Medicaid and utility assistance. 534-121-2094 ? RCATS: Transportation to medical appointments. 450 447 2195 ? Social Security Administration: May apply for disability if have a Stage IV cancer. 680-040-6760 (681) 400-6066 ? [employer], Disability and Transit Services: Assists with nutrition, care and transit needs. [place] Support Programs:   > Cancer Support Group  2nd Tuesday of the month 1pm-2pm, Journey Room   > Creative Journey  3rd Tuesday of the month 1130am-1pm, Journey Room

## 2017-12-10 NOTE — Progress Notes (Signed)
Kara Santos tolerated Feraheme infusion well without complaints or incident. VSS upon discharge. Pt discharged self ambulatory using walker in satisfactory condition accompanied by family member

## 2017-12-15 DIAGNOSIS — Z20828 Contact with and (suspected) exposure to other viral communicable diseases: Secondary | ICD-10-CM | POA: Diagnosis not present

## 2017-12-23 ENCOUNTER — Other Ambulatory Visit: Payer: Self-pay

## 2017-12-23 ENCOUNTER — Encounter (HOSPITAL_COMMUNITY): Payer: Self-pay | Admitting: Emergency Medicine

## 2017-12-23 ENCOUNTER — Inpatient Hospital Stay (HOSPITAL_COMMUNITY)
Admission: EM | Admit: 2017-12-23 | Discharge: 2017-12-26 | DRG: 682 | Disposition: A | Discharge status: Skilled Nursing Facility | Payer: Medicare Other | Attending: Pulmonary Disease | Admitting: Pulmonary Disease

## 2017-12-23 DIAGNOSIS — G309 Alzheimer's disease, unspecified: Secondary | ICD-10-CM | POA: Diagnosis present

## 2017-12-23 DIAGNOSIS — Z7189 Other specified counseling: Secondary | ICD-10-CM

## 2017-12-23 DIAGNOSIS — Z681 Body mass index (BMI) 19 or less, adult: Secondary | ICD-10-CM | POA: Diagnosis not present

## 2017-12-23 DIAGNOSIS — Z515 Encounter for palliative care: Secondary | ICD-10-CM

## 2017-12-23 DIAGNOSIS — E119 Type 2 diabetes mellitus without complications: Secondary | ICD-10-CM | POA: Diagnosis not present

## 2017-12-23 DIAGNOSIS — E039 Hypothyroidism, unspecified: Secondary | ICD-10-CM | POA: Diagnosis not present

## 2017-12-23 DIAGNOSIS — F0280 Dementia in other diseases classified elsewhere without behavioral disturbance: Secondary | ICD-10-CM | POA: Diagnosis present

## 2017-12-23 DIAGNOSIS — N179 Acute kidney failure, unspecified: Secondary | ICD-10-CM | POA: Diagnosis not present

## 2017-12-23 DIAGNOSIS — Z66 Do not resuscitate: Secondary | ICD-10-CM | POA: Diagnosis present

## 2017-12-23 DIAGNOSIS — E860 Dehydration: Secondary | ICD-10-CM | POA: Diagnosis not present

## 2017-12-23 DIAGNOSIS — E43 Unspecified severe protein-calorie malnutrition: Secondary | ICD-10-CM

## 2017-12-23 DIAGNOSIS — Z79899 Other long term (current) drug therapy: Secondary | ICD-10-CM

## 2017-12-23 DIAGNOSIS — E876 Hypokalemia: Secondary | ICD-10-CM | POA: Diagnosis not present

## 2017-12-23 DIAGNOSIS — D509 Iron deficiency anemia, unspecified: Secondary | ICD-10-CM | POA: Diagnosis not present

## 2017-12-23 DIAGNOSIS — K31819 Angiodysplasia of stomach and duodenum without bleeding: Secondary | ICD-10-CM | POA: Diagnosis present

## 2017-12-23 DIAGNOSIS — K31811 Angiodysplasia of stomach and duodenum with bleeding: Secondary | ICD-10-CM | POA: Diagnosis present

## 2017-12-23 DIAGNOSIS — Z7984 Long term (current) use of oral hypoglycemic drugs: Secondary | ICD-10-CM

## 2017-12-23 DIAGNOSIS — R1310 Dysphagia, unspecified: Secondary | ICD-10-CM | POA: Diagnosis not present

## 2017-12-23 DIAGNOSIS — K219 Gastro-esophageal reflux disease without esophagitis: Secondary | ICD-10-CM | POA: Diagnosis present

## 2017-12-23 LAB — CBC WITH DIFFERENTIAL/PLATELET
Basophils Absolute: 0 10*3/uL (ref 0.0–0.1)
Basophils Relative: 0 %
Eosinophils Absolute: 0.1 10*3/uL (ref 0.0–0.7)
Eosinophils Relative: 1 %
HCT: 30.5 % — ABNORMAL LOW (ref 36.0–46.0)
Hemoglobin: 9.6 g/dL — ABNORMAL LOW (ref 12.0–15.0)
Lymphocytes Relative: 10 %
Lymphs Abs: 1 10*3/uL (ref 0.7–4.0)
MCH: 29.7 pg (ref 26.0–34.0)
MCHC: 31.5 g/dL (ref 30.0–36.0)
MCV: 94.4 fL (ref 78.0–100.0)
Monocytes Absolute: 0.7 10*3/uL (ref 0.1–1.0)
Monocytes Relative: 7 %
Neutro Abs: 8.4 10*3/uL — ABNORMAL HIGH (ref 1.7–7.7)
Neutrophils Relative %: 82 %
Platelets: 222 10*3/uL (ref 150–400)
RBC: 3.23 MIL/uL — ABNORMAL LOW (ref 3.87–5.11)
RDW: 16.8 % — ABNORMAL HIGH (ref 11.5–15.5)
WBC: 10.2 10*3/uL (ref 4.0–10.5)

## 2017-12-23 LAB — URINALYSIS, ROUTINE W REFLEX MICROSCOPIC
Bilirubin Urine: NEGATIVE
Glucose, UA: NEGATIVE mg/dL
Hgb urine dipstick: NEGATIVE
Ketones, ur: NEGATIVE mg/dL
Leukocytes, UA: NEGATIVE
Nitrite: NEGATIVE
Protein, ur: NEGATIVE mg/dL
Specific Gravity, Urine: 1.012 (ref 1.005–1.030)
pH: 6 (ref 5.0–8.0)

## 2017-12-23 LAB — BASIC METABOLIC PANEL
Anion gap: 18 — ABNORMAL HIGH (ref 5–15)
BUN: 30 mg/dL — ABNORMAL HIGH (ref 6–20)
CO2: 24 mmol/L (ref 22–32)
Calcium: 7.8 mg/dL — ABNORMAL LOW (ref 8.9–10.3)
Chloride: 97 mmol/L — ABNORMAL LOW (ref 101–111)
Creatinine, Ser: 2.03 mg/dL — ABNORMAL HIGH (ref 0.44–1.00)
GFR calc Af Amer: 22 mL/min — ABNORMAL LOW (ref >60)
GFR calc non Af Amer: 19 mL/min — ABNORMAL LOW (ref >60)
Glucose, Bld: 195 mg/dL — ABNORMAL HIGH (ref 65–99)
Potassium: 2.8 mmol/L — ABNORMAL LOW (ref 3.5–5.1)
Sodium: 139 mmol/L (ref 135–145)

## 2017-12-23 MED ORDER — POTASSIUM CHLORIDE CRYS ER 20 MEQ PO TBCR
60.0000 meq | EXTENDED_RELEASE_TABLET | Freq: Once | ORAL | Status: AC
  Administered 2017-12-23: 60 meq via ORAL
  Filled 2017-12-23: qty 3

## 2017-12-23 MED ORDER — SODIUM CHLORIDE 0.9 % IV BOLUS (SEPSIS)
1000.0000 mL | Freq: Once | INTRAVENOUS | Status: AC
  Administered 2017-12-23: 1000 mL via INTRAVENOUS

## 2017-12-23 NOTE — ED Notes (Signed)
O2 monitor not on finger.

## 2017-12-23 NOTE — ED Triage Notes (Signed)
PT from [facility].  Having difficulty swallowing.  Pt on Levaquin currently

## 2017-12-23 NOTE — ED Notes (Signed)
MD provider at bedside

## 2017-12-24 ENCOUNTER — Encounter (HOSPITAL_COMMUNITY): Payer: Self-pay

## 2017-12-24 ENCOUNTER — Other Ambulatory Visit: Payer: Self-pay

## 2017-12-24 DIAGNOSIS — Z515 Encounter for palliative care: Secondary | ICD-10-CM | POA: Diagnosis not present

## 2017-12-24 DIAGNOSIS — E860 Dehydration: Secondary | ICD-10-CM

## 2017-12-24 DIAGNOSIS — Z681 Body mass index (BMI) 19 or less, adult: Secondary | ICD-10-CM | POA: Diagnosis not present

## 2017-12-24 DIAGNOSIS — E43 Unspecified severe protein-calorie malnutrition: Secondary | ICD-10-CM | POA: Diagnosis present

## 2017-12-24 DIAGNOSIS — G309 Alzheimer's disease, unspecified: Secondary | ICD-10-CM | POA: Diagnosis present

## 2017-12-24 DIAGNOSIS — D508 Other iron deficiency anemias: Secondary | ICD-10-CM

## 2017-12-24 DIAGNOSIS — F0280 Dementia in other diseases classified elsewhere without behavioral disturbance: Secondary | ICD-10-CM | POA: Diagnosis present

## 2017-12-24 DIAGNOSIS — K31811 Angiodysplasia of stomach and duodenum with bleeding: Secondary | ICD-10-CM | POA: Diagnosis not present

## 2017-12-24 DIAGNOSIS — N179 Acute kidney failure, unspecified: Secondary | ICD-10-CM | POA: Diagnosis not present

## 2017-12-24 DIAGNOSIS — D509 Iron deficiency anemia, unspecified: Secondary | ICD-10-CM | POA: Diagnosis present

## 2017-12-24 DIAGNOSIS — E119 Type 2 diabetes mellitus without complications: Secondary | ICD-10-CM | POA: Diagnosis not present

## 2017-12-24 DIAGNOSIS — K219 Gastro-esophageal reflux disease without esophagitis: Secondary | ICD-10-CM | POA: Diagnosis present

## 2017-12-24 DIAGNOSIS — Z66 Do not resuscitate: Secondary | ICD-10-CM | POA: Diagnosis present

## 2017-12-24 DIAGNOSIS — Z7984 Long term (current) use of oral hypoglycemic drugs: Secondary | ICD-10-CM | POA: Diagnosis not present

## 2017-12-24 DIAGNOSIS — E039 Hypothyroidism, unspecified: Secondary | ICD-10-CM

## 2017-12-24 DIAGNOSIS — E876 Hypokalemia: Secondary | ICD-10-CM | POA: Diagnosis present

## 2017-12-24 DIAGNOSIS — K31819 Angiodysplasia of stomach and duodenum without bleeding: Secondary | ICD-10-CM | POA: Diagnosis present

## 2017-12-24 DIAGNOSIS — Z79899 Other long term (current) drug therapy: Secondary | ICD-10-CM | POA: Diagnosis not present

## 2017-12-24 DIAGNOSIS — Z7189 Other specified counseling: Secondary | ICD-10-CM | POA: Diagnosis not present

## 2017-12-24 LAB — BASIC METABOLIC PANEL
ANION GAP: 14 (ref 5–15)
BUN: 29 mg/dL — ABNORMAL HIGH (ref 6–20)
CALCIUM: 7 mg/dL — AB (ref 8.9–10.3)
CO2: 21 mmol/L — ABNORMAL LOW (ref 22–32)
Chloride: 105 mmol/L (ref 101–111)
Creatinine, Ser: 1.88 mg/dL — ABNORMAL HIGH (ref 0.44–1.00)
GFR, EST AFRICAN AMERICAN: 24 mL/min — AB (ref >60)
GFR, EST NON AFRICAN AMERICAN: 21 mL/min — AB (ref >60)
Glucose, Bld: 154 mg/dL — ABNORMAL HIGH (ref 65–99)
Potassium: 3.3 mmol/L — ABNORMAL LOW (ref 3.5–5.1)
Sodium: 140 mmol/L (ref 135–145)

## 2017-12-24 LAB — CBC
HCT: 27.4 % — ABNORMAL LOW (ref 36.0–46.0)
HEMOGLOBIN: 8.6 g/dL — AB (ref 12.0–15.0)
MCH: 29.9 pg (ref 26.0–34.0)
MCHC: 31.4 g/dL (ref 30.0–36.0)
MCV: 95.1 fL (ref 78.0–100.0)
Platelets: 177 10*3/uL (ref 150–400)
RBC: 2.88 MIL/uL — AB (ref 3.87–5.11)
RDW: 16.9 % — ABNORMAL HIGH (ref 11.5–15.5)
WBC: 9.8 10*3/uL (ref 4.0–10.5)

## 2017-12-24 LAB — GLUCOSE, CAPILLARY
GLUCOSE-CAPILLARY: 155 mg/dL — AB (ref 65–99)
GLUCOSE-CAPILLARY: 144 mg/dL — AB (ref 65–99)
Glucose-Capillary: 173 mg/dL — ABNORMAL HIGH (ref 65–99)
Glucose-Capillary: 95 mg/dL (ref 65–99)
Glucose-Capillary: 138 mg/dL — ABNORMAL HIGH (ref 65–99)

## 2017-12-24 LAB — MRSA PCR SCREENING: MRSA by PCR: POSITIVE — AB

## 2017-12-24 LAB — SODIUM, URINE, RANDOM: Sodium, Ur: 79 mmol/L

## 2017-12-24 LAB — MAGNESIUM: MAGNESIUM: 1.3 mg/dL — AB (ref 1.7–2.4)

## 2017-12-24 LAB — CREATININE, URINE, RANDOM: CREATININE, URINE: 59.23 mg/dL

## 2017-12-24 MED ORDER — TRAMADOL HCL 50 MG PO TABS: 50.0000 mg | ORAL_TABLET | Freq: Four times a day (QID) | ORAL | Status: DC | PRN

## 2017-12-24 MED ORDER — ONDANSETRON HCL 4 MG/2ML IJ SOLN: 4.0000 mg | Freq: Four times a day (QID) | INTRAMUSCULAR | Status: DC | PRN

## 2017-12-24 MED ORDER — FERROUS SULFATE 325 (65 FE) MG PO TABS
325.0000 mg | ORAL_TABLET | Freq: Every day | ORAL | Status: DC
  Administered 2017-12-24 – 2017-12-26 (×3): 325 mg via ORAL
  Filled 2017-12-24 (×3): qty 1

## 2017-12-24 MED ORDER — ADULT MULTIVITAMIN W/MINERALS CH
1.0000 | ORAL_TABLET | Freq: Every day | ORAL | Status: DC
  Administered 2017-12-24 – 2017-12-26 (×3): 1 via ORAL
  Filled 2017-12-24 (×3): qty 1

## 2017-12-24 MED ORDER — MAGNESIUM SULFATE 2 GM/50ML IV SOLN
2.0000 g | Freq: Once | INTRAVENOUS | Status: AC
  Administered 2017-12-24: 2 g via INTRAVENOUS

## 2017-12-24 MED ORDER — POTASSIUM CHLORIDE IN NACL 40-0.9 MEQ/L-% IV SOLN
INTRAVENOUS | Status: AC
  Administered 2017-12-24: 75 mL/h via INTRAVENOUS
  Filled 2017-12-24: qty 1000

## 2017-12-24 MED ORDER — LEVOTHYROXINE SODIUM 50 MCG PO TABS
25.0000 ug | ORAL_TABLET | Freq: Every day | ORAL | Status: DC
  Administered 2017-12-24 – 2017-12-26 (×3): 25 ug via ORAL
  Filled 2017-12-24 (×3): qty 1

## 2017-12-24 MED ORDER — SODIUM CHLORIDE 0.9 % IV SOLN
INTRAVENOUS | Status: DC
  Administered 2017-12-24 – 2017-12-25 (×3): via INTRAVENOUS

## 2017-12-24 MED ORDER — ONDANSETRON HCL 4 MG PO TABS: 4.0000 mg | ORAL_TABLET | Freq: Four times a day (QID) | ORAL | Status: DC | PRN

## 2017-12-24 MED ORDER — BISACODYL 5 MG PO TBEC: 5.0000 mg | DELAYED_RELEASE_TABLET | Freq: Every day | ORAL | Status: DC | PRN

## 2017-12-24 MED ORDER — INSULIN ASPART 100 UNIT/ML ~~LOC~~ SOLN
0.0000 [IU] | SUBCUTANEOUS | Status: DC
  Administered 2017-12-24: 2 [IU] via SUBCUTANEOUS
  Administered 2017-12-24 – 2017-12-25 (×3): 1 [IU] via SUBCUTANEOUS
  Administered 2017-12-25: 2 [IU] via SUBCUTANEOUS
  Administered 2017-12-25: 3 [IU] via SUBCUTANEOUS
  Administered 2017-12-26: 2 [IU] via SUBCUTANEOUS
  Administered 2017-12-26: 3 [IU] via SUBCUTANEOUS
  Administered 2017-12-26: 1 [IU] via SUBCUTANEOUS

## 2017-12-24 MED ORDER — SODIUM CHLORIDE 0.9% FLUSH
3.0000 mL | Freq: Two times a day (BID) | INTRAVENOUS | Status: DC
  Administered 2017-12-24 – 2017-12-25 (×2): 3 mL via INTRAVENOUS

## 2017-12-24 MED ORDER — SERTRALINE HCL 50 MG PO TABS
100.0000 mg | ORAL_TABLET | Freq: Every day | ORAL | Status: DC
  Administered 2017-12-24 – 2017-12-26 (×3): 100 mg via ORAL
  Filled 2017-12-24 (×3): qty 2

## 2017-12-24 MED ORDER — VITAMIN B-12 100 MCG PO TABS
100.0000 ug | ORAL_TABLET | Freq: Every day | ORAL | Status: DC
  Administered 2017-12-24 – 2017-12-26 (×3): 100 ug via ORAL
  Filled 2017-12-24 (×3): qty 1

## 2017-12-24 MED ORDER — ENSURE ENLIVE PO LIQD
237.0000 mL | Freq: Three times a day (TID) | ORAL | Status: DC
  Administered 2017-12-24 – 2017-12-25 (×4): 237 mL via ORAL

## 2017-12-24 MED ORDER — PANTOPRAZOLE SODIUM 40 MG PO TBEC
40.0000 mg | DELAYED_RELEASE_TABLET | Freq: Every day | ORAL | Status: DC
  Administered 2017-12-24 – 2017-12-26 (×3): 40 mg via ORAL
  Filled 2017-12-24 (×3): qty 1

## 2017-12-24 MED ORDER — ACETAMINOPHEN 325 MG PO TABS: 650.0000 mg | ORAL_TABLET | Freq: Four times a day (QID) | ORAL | Status: DC | PRN

## 2017-12-24 MED ORDER — MAGNESIUM SULFATE 2 GM/50ML IV SOLN
2.0000 g | Freq: Once | INTRAVENOUS | Status: AC
  Administered 2017-12-24: 2 g via INTRAVENOUS
  Filled 2017-12-24: qty 50

## 2017-12-24 MED ORDER — SENNOSIDES-DOCUSATE SODIUM 8.6-50 MG PO TABS: 1.0000 | ORAL_TABLET | Freq: Every evening | ORAL | Status: DC | PRN

## 2017-12-24 MED ORDER — ACETAMINOPHEN 650 MG RE SUPP: 650.0000 mg | Freq: Four times a day (QID) | RECTAL | Status: DC | PRN

## 2017-12-24 MED ORDER — GUAIFENESIN-DM 100-10 MG/5ML PO SYRP: 10.0000 mL | ORAL_SOLUTION | Freq: Four times a day (QID) | ORAL | Status: DC | PRN

## 2017-12-24 NOTE — Plan of Care (Signed)
progressing 

## 2017-12-24 NOTE — ED Provider Notes (Signed)
[facility] Provider Note   CSN: [id number] Arrival date & time: 12/23/17  1900     History   Chief Complaint Chief Complaint  Patient presents with  . Dysphagia    HPI Kara Santos is a 84 y.o. female.  HPI   5 years young female sent for evaluation after she was refusing to take some of her medications.  Patient herself is pleasantly demented.  She has no acute complaints.  She is questioning why she is here.  Daughter is at the side.  She reports that she might perhaps be a little bit more confused than she typically is.  Past Medical History:  Diagnosis Date  . Alzheimer's disease, early onset 44  . Anemia 11/07/2011   chronic IDA requiring multiple transfusions  . AVM (arteriovenous malformation) of stomach, acquired with hemorrhage 12/04/2013  . Closed fracture of greater trochanter of femur ([place]) 02/2013   left  . Depression   . Diabetes mellitus   . Fall at home 11/17/14   multiple rib fractures  . GERD (gastroesophageal reflux disease)   . Hypothyroidism    s/p thyroid surgery for thyroid cancer  . Iron deficiency anemia 05/05/2012    Patient Active Problem List   Diagnosis Date Noted  . AKI (acute kidney injury) ([place]) 12/23/2017  . GI bleed 08/07/2015  . Mandibular fracture, closed ([place]) 08/07/2015  . Acute encephalopathy 08/07/2015  . Hematochezia 08/07/2015  . Fall 11/20/2014  . Multiple rib fractures 11/17/2014  . AVM (arteriovenous malformation) of stomach, acquired with hemorrhage 12/04/2013  . Closed fracture of greater trochanter of femur ([place]) 02/06/2013  . History of fall 02/06/2013  . Iron deficiency anemia 05/05/2012  . Chronic gastrointestinal bleeding 03/04/2012  . Diabetes mellitus type 2 in nonobese ([place]) 03/04/2012  . Hypothyroidism 03/04/2012  . History of thyroid cancer 03/04/2012    Past Surgical History:  Procedure Laterality Date  . APPENDECTOMY    . bilateral cataract extractions    . BREAST REDUCTION  SURGERY    . CHOLECYSTECTOMY    . COLONOSCOPY  08/2006   Dr. Cecelia Byars sided diverticulosis  . double balloon enteroscopy  06/1008   [facility]-->APC of 12 SB AVMs in jejunum. 6 feet of jejunum inspected  . ESOPHAGOGASTRODUODENOSCOPY  08/2006   Dr. Trevor Iha [facility], large duodenal diverticulum  . Givens small bowel capsule  08/2006   few gastric erosions, numerous small bowel AVMs with SB erosions  . right shoulder surgery    . THYROID SURGERY     thyroid cancer    OB History   None      Home Medications    Prior to Admission medications   Medication Sig Start Date End Date Taking? Authorizing Provider  acetaminophen (TYLENOL) 500 MG tablet Take 1,000 mg by mouth 3 (three) times daily.    [provider]  benzocaine-resorcinol (VAGISIL) 5-2 % vaginal cream Place 1 Applicatorful vaginally daily as needed for itching or irritation.    [provider]  cyanocobalamin 100 MCG tablet Take 100 mcg by mouth daily.    [provider]  ferrous sulfate 325 (65 FE) MG tablet Take 325 mg by mouth daily with breakfast.    [provider]  furosemide (LASIX) 40 MG tablet  08/19/16   [provider]  guaiFENesin-dextromethorphan (ROBITUSSIN DM) 100-10 MG/5ML syrup Take 10 mLs by mouth every 6 (six) hours as needed for cough.    [provider]  levothyroxine (SYNTHROID, LEVOTHROID) 25 MCG tablet  08/12/17   [provider]  metFORMIN (GLUCOPHAGE-XR) 500 MG 24 hr tablet Take 500 mg by mouth daily with breakfast.     [provider]  Multiple Vitamin (MULTIVITAMIN) tablet Take 1 tablet by mouth daily.    [provider]  pantoprazole (PROTONIX) 40 MG tablet Take 40 mg by mouth daily.    [provider]  sertraline (ZOLOFT) 100 MG tablet Take 1 tablet by mouth daily. 12/05/17   [provider]  traMADol (ULTRAM) 50 MG tablet Take 50 mg by mouth every 6 (six) hours as needed for moderate pain. Pain not  relieved by Tylenol.    [provider]    Family History Family History  Problem Relation Age of Onset  . Cancer Mother   . Cancer Father   . Diabetes Father   . Diabetes Sister   . Cancer Brother     Social History Social History   Tobacco Use  . Smoking status: Never Smoker  . Smokeless tobacco: Never Used  Substance Use Topics  . Alcohol use: No  . Drug use: No     Allergies   Asa [aspirin]   Review of Systems Review of Systems  All systems reviewed and negative, other than as noted in HPI.  Physical Exam Updated Vital Signs BP 114/64 (BP Location: Left Arm)   Pulse 84   Temp 97.8 F (36.6 C) (Oral)   Resp 16   Ht 5' (1.524 m)   Wt 39.7 kg (87 lb 8.4 oz)   SpO2 99%   BMI 17.09 kg/m   Physical Exam  Constitutional: She appears well-developed and well-nourished. No distress.  HENT:  Head: Normocephalic and atraumatic.  Eyes: Conjunctivae are normal. Right eye exhibits no discharge. Left eye exhibits no discharge.  Neck: Neck supple.  Cardiovascular: Normal rate, regular rhythm and normal heart sounds. Exam reveals no gallop and no friction rub.  No murmur heard. Pulmonary/Chest: Effort normal and breath sounds normal. No respiratory distress.  Abdominal: Soft. She exhibits no distension. There is no tenderness.  Musculoskeletal: She exhibits no edema or tenderness.  Neurological: She is alert.  Skin: Skin is warm and dry.  Psychiatric: She has a normal mood and affect. Her behavior is normal. Thought content normal.  Nursing note and vitals reviewed.    ED Treatments / Results  Labs (all labs ordered are listed, but only abnormal results are displayed) Labs Reviewed  CBC WITH DIFFERENTIAL/PLATELET - Abnormal; Notable for the following components:      Result Value   RBC 3.23 (*)    Hemoglobin 9.6 (*)    HCT 30.5 (*)    RDW 16.8 (*)    Neutro Abs 8.4 (*)    All other components within normal limits  BASIC METABOLIC PANEL -  Abnormal; Notable for the following components:   Potassium 2.8 (*)    Chloride 97 (*)    Glucose, Bld 195 (*)    BUN 30 (*)    Creatinine, Ser 2.03 (*)    Calcium 7.8 (*)    GFR calc non Af Amer 19 (*)    GFR calc Af Amer 22 (*)    Anion gap 18 (*)    All other components within normal limits  BASIC METABOLIC PANEL - Abnormal; Notable for the following components:   Potassium 3.3 (*)    CO2 21 (*)    Glucose, Bld 154 (*)    BUN 29 (*)    Creatinine, Ser 1.88 (*)  Calcium 7.0 (*)    GFR calc non Af Amer 21 (*)    GFR calc Af Amer 24 (*)    All other components within normal limits  CBC - Abnormal; Notable for the following components:   RBC 2.88 (*)    Hemoglobin 8.6 (*)    HCT 27.4 (*)    RDW 16.9 (*)    All other components within normal limits  MAGNESIUM - Abnormal; Notable for the following components:   Magnesium 1.3 (*)    All other components within normal limits  GLUCOSE, CAPILLARY - Abnormal; Notable for the following components:   Glucose-Capillary 155 (*)    All other components within normal limits  GLUCOSE, CAPILLARY - Abnormal; Notable for the following components:   Glucose-Capillary 144 (*)    All other components within normal limits  MRSA PCR SCREENING  URINALYSIS, ROUTINE W REFLEX MICROSCOPIC  SODIUM, URINE, RANDOM  CREATININE, URINE, RANDOM  UREA NITROGEN, URINE    EKG  EKG Interpretation None       Radiology No results found.  Procedures Procedures (including critical care time)  Medications Ordered in ED Medications  vitamin B-12 (CYANOCOBALAMIN) tablet 100 mcg (has no administration in time range)  ferrous sulfate tablet 325 mg (has no administration in time range)  guaiFENesin-dextromethorphan (ROBITUSSIN DM) 100-10 MG/5ML syrup 10 mL (has no administration in time range)  levothyroxine (SYNTHROID, LEVOTHROID) tablet 25 mcg (has no administration in time range)  multivitamin with minerals tablet 1 tablet (has no administration in  time range)  pantoprazole (PROTONIX) EC tablet 40 mg (has no administration in time range)  sertraline (ZOLOFT) tablet 100 mg (has no administration in time range)  traMADol (ULTRAM) tablet 50 mg (has no administration in time range)  sodium chloride flush (NS) 0.9 % injection 3 mL (3 mLs Intravenous Not Given 12/24/17 0059)  0.9 % NaCl with KCl 40 mEq / L  infusion (75 mL/hr Intravenous New Bag/Given 12/24/17 0115)  acetaminophen (TYLENOL) tablet 650 mg (has no administration in time range)    Or  acetaminophen (TYLENOL) suppository 650 mg (has no administration in time range)  senna-docusate (Senokot-S) tablet 1 tablet (has no administration in time range)  bisacodyl (DULCOLAX) EC tablet 5 mg (has no administration in time range)  ondansetron (ZOFRAN) tablet 4 mg (has no administration in time range)    Or  ondansetron (ZOFRAN) injection 4 mg (has no administration in time range)  insulin aspart (novoLOG) injection 0-9 Units (0 Units Subcutaneous Not Given 12/24/17 0515)  0.9 %  sodium chloride infusion (has no administration in time range)  sodium chloride 0.9 % bolus 1,000 mL (0 mLs Intravenous Stopped 12/24/17 0058)  potassium chloride SA (K-DUR,KLOR-CON) CR tablet 60 mEq (60 mEq Oral Given 12/23/17 2250)  magnesium sulfate IVPB 2 g 50 mL (0 g Intravenous Stopped 12/24/17 0200)     Initial Impression / Assessment and Plan / ED Course  I have reviewed the triage vital signs and the nursing notes.  Pertinent labs & imaging results that were available during my care of the patient were reviewed by me and considered in my medical decision making (see chart for details).     84 year old female with a change in her mental status.  She appears to some dehydration and hypokalemia.  Supplementation, IV fluids.  Will admit for ongoing treatment.  Final Clinical Impressions(s) / ED Diagnoses   Final diagnoses:  Dehydration  Hypokalemia    ED Discharge Orders    None  Virgel Manifold, MD 12/24/17 [phone]

## 2017-12-24 NOTE — Evaluation (Signed)
Clinical/Bedside Swallow Evaluation Patient Details  Name: Kara Santos MRN: [id number] Date of Birth: 1917-04-21  Today's Date: 12/24/2017 Time: SLP Start Time (ACUTE ONLY): 1506 SLP Stop Time (ACUTE ONLY): 1532 SLP Time Calculation (min) (ACUTE ONLY): 26 min  Past Medical History:  Past Medical History:  Diagnosis Date  . Alzheimer's disease, early onset 89  . Anemia 11/07/2011   chronic IDA requiring multiple transfusions  . AVM (arteriovenous malformation) of stomach, acquired with hemorrhage 12/04/2013  . Closed fracture of greater trochanter of femur ([place]) 02/2013   left  . Depression   . Diabetes mellitus   . Fall at home 11/17/14   multiple rib fractures  . GERD (gastroesophageal reflux disease)   . Hypothyroidism    s/p thyroid surgery for thyroid cancer  . Iron deficiency anemia 05/05/2012   Past Surgical History:  Past Surgical History:  Procedure Laterality Date  . APPENDECTOMY    . bilateral cataract extractions    . BREAST REDUCTION SURGERY    . CHOLECYSTECTOMY    . COLONOSCOPY  08/2006   Dr. Cecelia Byars sided diverticulosis  . double balloon enteroscopy  06/1008   [facility]-->APC of 12 SB AVMs in jejunum. 6 feet of jejunum inspected  . ESOPHAGOGASTRODUODENOSCOPY  08/2006   Dr. Trevor Iha [facility], large duodenal diverticulum  . Givens small bowel capsule  08/2006   few gastric erosions, numerous small bowel AVMs with SB erosions  . right shoulder surgery    . THYROID SURGERY     thyroid cancer   HPI:  84 y.o. female with medical history significant for Alzheimer's dementia chronic iron deficiency anemia attributed to chronic, blood loss from gastric AVM, type 2 diabetes mellitus, and hypothyroidism, now presenting to the emergency department for evaluation of not eating or drinking.  Patient is accompanied by her daughter who assist with the history.  Family was notified today by the nursing facility that the patient was being transported to the emergency department  for evaluation of not eating or drinking.  Patient herself has not voiced any complaints and seems to be in her usual state per report of family.  She is asking for water in the emergency department, drinking with no apparent difficulty or choking.   Assessment / Plan / Recommendation Clinical Impression   Pt presents with a normal oropharyngeal swallow with impaired mastication only noted d/t ill-fitting dentures during solid intake; she was able to masticate slowly, but thoroughly.  She is in the process of acquiring new partials to assist with intake.  No overt s/s of aspiration noted throughout intake of thin liquids-solids.  Recommend continue Regular/thin liquid diet with mild aspiration risk only d/t cognitive impairment as pt was frequently distracted during intake with minimal verbal redirection required during snack.  ST will f/u x1 for diet tolerance while in acute setting. SLP Visit Diagnosis: Dysphagia, unspecified (R13.10)    Aspiration Risk  Mild aspiration risk    Diet Recommendation   Regular/thin liquids  Medication Administration: Whole meds with puree    Other  Recommendations Oral Care Recommendations: Oral care BID   Follow up Recommendations Skilled Nursing facility      Frequency and Duration 1x for meal tolerance          Prognosis Prognosis for Safe Diet Advancement: Good      Swallow Study   General Date of Onset: 12/23/17 HPI: 84 y.o. female with medical history significant for Alzheimer's dementia chronic iron deficiency anemia attributed to chronic, blood loss from gastric AVM, type  2 diabetes mellitus, and hypothyroidism, now presenting to the emergency department for evaluation of not eating or drinking.  Patient is accompanied by her daughter who assist with the history.  Family was notified today by the nursing facility that the patient was being transported to the emergency department for evaluation of not eating or drinking.  Patient herself has not  voiced any complaints and seems to be in her usual state per report of family.  She is asking for water in the emergency department, drinking with no apparent difficulty or choking. Type of Study: Bedside Swallow Evaluation Previous Swallow Assessment: n/a Diet Prior to this Study: Regular;Thin liquids Temperature Spikes Noted: No Respiratory Status: Room air History of Recent Intubation: No Behavior/Cognition: Alert;Confused Oral Cavity Assessment: Dry Oral Care Completed by SLP: Recent completion by staff Oral Cavity - Dentition: Adequate natural dentition;Dentures, top Vision: Functional for self-feeding Self-Feeding Abilities: Able to feed self;Needs set up Patient Positioning: Upright in bed Baseline Vocal Quality: Normal Volitional Cough: Strong Volitional Swallow: Able to elicit    Oral/Motor/Sensory Function Overall Oral Motor/Sensory Function: Within functional limits   Ice Chips Ice chips: Not tested   Thin Liquid Thin Liquid: Within functional limits Presentation: Cup;Straw    Nectar Thick Nectar Thick Liquid: Not tested   Honey Thick Honey Thick Liquid: Not tested   Puree Puree: Within functional limits Presentation: Self Fed   Solid      Solid: Impaired Presentation: Self Fed Oral Phase Impairments: Impaired mastication Oral Phase Functional Implications: Impaired mastication Other Comments: (ill-fitting dentures)        Elvina Sidle, M.S., CCC-SLP 12/24/2017,3:59 PM

## 2017-12-24 NOTE — Consult Note (Signed)
Consultation Note Date: 12/24/2017   Patient Name: Kara Santos  DOB: 12/14/1916  MRN: [id number]  Age / Sex: 84 y.o., female  PCP: Sinda Du, MD Referring Physician: Sinda Du, MD  Reason for Consultation: Establishing goals of care and Psychosocial/spiritual support  HPI/Patient Profile: 84 y.o. female  with past medical history of Alzheimer's disease, anemia of iron deficiency requiring iron transfusions every 3 months, left hip fracture 2014, AV malformation in stomach with hemorrhage in 2015, diabetes, GERD, hypothyroid admitted on 12/23/2017 with acute kidney injury related to poor by mouth intake, possibly related to dementia..   Clinical Assessment and Goals of Care: Kara Santos is resting quietly in bed.  She is eating some ice cream from a cup, feeding herself without signs and symptoms of aspiration.  She makes and keeps eye contact, is calm and cooperative, pleasant.  Present today at bedside is daughter, Creola Corn.  We talked about Kara Santos's chronic and acute health problems.  We talked about what is normal and expected for dementia declines.  I share a diagram of the chronic illness pathway.  We talked about the treatment plan including IV fluids, magnesium and potassium.  I share my concerns that Kara Santos will improve with treatment, but return to [facility] and again become dehydrated.  [name] states that this is her concern also.  We talked about hospice in place at [facility] as an extra layer of support.  Daughter [name] agrees to referral through [facility].  We talked about continuing to treat the treatable.  Conference with case management related to "treat the treatable" hospice referral.  Healthcare power of attorney NEXT OF KIN -daughter, Creola Corn.   SUMMARY OF RECOMMENDATIONS   Continue to treat the treatable but no extraordinary measures such as CPR or intubation. Return  to [facility] with the benefits of hospice in place, continuing to treat the treatable.  Code Status/Advance Care Planning:  DNR  Symptom Management:   Per hospitalist, no additional needs at this time.  Palliative Prophylaxis:   Aspiration  Additional Recommendations (Limitations, Scope, Preferences):  Treat the treatable but no CPR, no intubation.  Psycho-social/Spiritual:   Desire for further Chaplaincy support:no  Additional Recommendations: Caregiving  Support/Resources and Education on Hospice  Prognosis:   < 6 months, would not be surprising based on advanced age, frailty, mid to late stage dementia, electrolyte imbalance, history of iron deficient anemia requiring transfusions every 3 months.  Discharge Planning: Return to [place], residential ALF, with hospice and palliative care of [facility] and home services.      Primary Diagnoses: Present on Admission: . AKI (acute kidney injury) ([place]) . AVM (arteriovenous malformation) of stomach, acquired with hemorrhage . Hypothyroidism . Iron deficiency anemia   I have reviewed the medical record, interviewed the patient and family, and examined the patient. The following aspects are pertinent.  Past Medical History:  Diagnosis Date  . Alzheimer's disease, early onset 46  . Anemia 11/07/2011   chronic IDA requiring multiple transfusions  . AVM (arteriovenous malformation) of stomach,  acquired with hemorrhage 12/04/2013  . Closed fracture of greater trochanter of femur ([place]) 02/2013   left  . Depression   . Diabetes mellitus   . Fall at home 11/17/14   multiple rib fractures  . GERD (gastroesophageal reflux disease)   . Hypothyroidism    s/p thyroid surgery for thyroid cancer  . Iron deficiency anemia 05/05/2012   Social History   Socioeconomic History  . Marital status: Widowed    Spouse name: Not on file  . Number of children: Not on file  . Years of education: Not on file  . Highest education  level: Not on file  Occupational History  . Not on file  Social Needs  . Financial resource strain: Not on file  . Food insecurity:    Worry: Not on file    Inability: Not on file  . Transportation needs:    Medical: Not on file    Non-medical: Not on file  Tobacco Use  . Smoking status: Never Smoker  . Smokeless tobacco: Never Used  Substance and Sexual Activity  . Alcohol use: No  . Drug use: No  . Sexual activity: Never  Lifestyle  . Physical activity:    Days per week: Not on file    Minutes per session: Not on file  . Stress: Not on file  Relationships  . Social connections:    Talks on phone: Not on file    Gets together: Not on file    Attends religious service: Not on file    Active member of club or organization: Not on file    Attends meetings of clubs or organizations: Not on file    Relationship status: Not on file  Other Topics Concern  . Not on file  Social History Narrative  . Not on file   Family History  Problem Relation Age of Onset  . Cancer Mother   . Cancer Father   . Diabetes Father   . Diabetes Sister   . Cancer Brother    Scheduled Meds: . feeding supplement (ENSURE ENLIVE)  237 mL Oral TID BM  . ferrous sulfate  325 mg Oral Q breakfast  . insulin aspart  0-9 Units Subcutaneous Q4H  . levothyroxine  25 mcg Oral QAC breakfast  . multivitamin with minerals  1 tablet Oral Daily  . pantoprazole  40 mg Oral Daily  . sertraline  100 mg Oral Daily  . sodium chloride flush  3 mL Intravenous Q12H  . cyanocobalamin  100 mcg Oral Daily   Continuous Infusions: . sodium chloride 75 mL/hr at 12/24/17 1000   PRN Meds:.acetaminophen **OR** acetaminophen, bisacodyl, guaiFENesin-dextromethorphan, ondansetron **OR** ondansetron (ZOFRAN) IV, senna-docusate, traMADol Medications Prior to Admission:  Prior to Admission medications   Medication Sig Start Date End Date Taking? Authorizing Provider  acetaminophen (TYLENOL) 500 MG tablet Take 1,000 mg by  mouth 3 (three) times daily.   Yes [provider]  benzocaine-resorcinol (VAGISIL) 5-2 % vaginal cream Place 1 Applicatorful vaginally daily as needed for itching or irritation.   Yes [provider]  cyanocobalamin 100 MCG tablet Take 100 mcg by mouth daily.   Yes [provider]  ferrous sulfate 325 (65 FE) MG tablet Take 325 mg by mouth daily with breakfast.   Yes [provider]  furosemide (LASIX) 40 MG tablet Take 40 mg by mouth daily.  08/19/16  Yes [provider]  guaiFENesin-dextromethorphan (ROBITUSSIN DM) 100-10 MG/5ML syrup Take 10 mLs by mouth every 6 (six) hours  as needed for cough.   Yes [provider]  levofloxacin (LEVAQUIN) 500 MG tablet Take 500 mg by mouth daily.   Yes [provider]  levothyroxine (SYNTHROID, LEVOTHROID) 25 MCG tablet Take 25 mcg by mouth daily before breakfast.  08/12/17  Yes [provider]  loperamide (IMODIUM A-D) 2 MG tablet Take 2 mg by mouth every 2 (two) hours as needed for diarrhea or loose stools.   Yes [provider]  metFORMIN (GLUCOPHAGE-XR) 500 MG 24 hr tablet Take 500 mg by mouth daily with breakfast.    Yes [provider]  Multiple Vitamin (MULTIVITAMIN) tablet Take 1 tablet by mouth daily.   Yes [provider]  ondansetron (ZOFRAN) 4 MG tablet Take 4 mg by mouth 4 (four) times daily as needed for nausea or vomiting.   Yes [provider]  oseltamivir (TAMIFLU) 75 MG capsule Take 75 mg by mouth daily.   Yes [provider]  pantoprazole (PROTONIX) 40 MG tablet Take 40 mg by mouth daily.   Yes [provider]  sertraline (ZOLOFT) 100 MG tablet Take 1 tablet by mouth daily. 12/05/17  Yes [provider]  sulfamethoxazole-trimethoprim (BACTRIM DS,SEPTRA DS) 800-160 MG tablet Take 1 tablet by mouth 2 (two) times daily.   Yes [provider]  traMADol (ULTRAM) 50 MG tablet Take 50 mg by mouth every 6 (six)  hours as needed for moderate pain. Pain not relieved by Tylenol.   Yes [provider]   Allergies  Allergen Reactions  . Asa [Aspirin]     Acid reflux    Review of Systems  Unable to perform ROS: Dementia    Physical Exam  Constitutional: No distress.  Appears weak and frail, chronically ill.  Makes and keeps eye contact  HENT:  Head: Atraumatic.  Some temporal wasting  Cardiovascular: Normal rate.  Pulmonary/Chest: Effort normal. No respiratory distress.  Abdominal: Soft. She exhibits no distension. There is no guarding.  Musculoskeletal: She exhibits no edema.  Muscle wasting  Neurological: She is alert.  Known dementia  Skin: Skin is warm and dry.  Psychiatric:  Calm and cooperative, pleasant  Nursing note and vitals reviewed.   Vital Signs: BP (!) 112/50 (BP Location: Left Arm)   Pulse 86   Temp 98.1 F (36.7 C) (Oral)   Resp 16   Ht 5' (1.524 m)   Wt 39.7 kg (87 lb 8.4 oz)   SpO2 98%   BMI 17.09 kg/m  Pain Scale: PAINAD   Pain Score: 0-No pain   SpO2: SpO2: 98 % O2 Device:SpO2: 98 % O2 Flow Rate: .   IO: Intake/output summary:   Intake/Output Summary (Last 24 hours) at 12/24/2017 1627 Last data filed at 12/24/2017 1310 Gross per 24 hour  Intake 836.25 ml  Output -  Net 836.25 ml    LBM: Last BM Date: 12/23/17 Baseline Weight: Weight: 43.1 kg (95 lb) Most recent weight: Weight: 39.7 kg (87 lb 8.4 oz)     Palliative Assessment/Data:   Flowsheet Rows     Most Recent Value  Intake Tab  Referral Department  Hospitalist  Unit at Time of Referral  Cardiac/Telemetry Unit  Palliative Care Primary Diagnosis  Nephrology  Date Notified  12/24/17  Reason for referral  Clarify Goals of Care, Advance Care Planning  Date of Admission  12/23/17  Date first seen by Palliative Care  12/24/17  # of days Palliative referral response time  0 Day(s)  # of days IP prior to  Palliative referral  1  Clinical Assessment  Palliative Performance Scale  Score  40%  Pain Max last 24 hours  Not able to report  Pain Min Last 24 hours  Not able to report  Dyspnea Max Last 24 Hours  Not able to report  Dyspnea Min Last 24 hours  Not able to report  Psychosocial & Spiritual Assessment  Palliative Care Outcomes  Patient/Family meeting held?  Yes  Who was at the meeting?  Patient and daughter at [place] regarding hospice, Provided advance care planning, Provided psychosocial or spiritual support, Clarified goals of care  Patient/Family wishes: Interventions discontinued/not started   Mechanical Ventilation      Time In: 1430 Time Out: 1520 Time Total: 50 minutes Greater than 50%  of this time was spent counseling and coordinating care related to the above assessment and plan.  Signed by: Drue Novel, NP   Please contact Palliative Medicine Team phone at [phone] for questions and concerns.  For individual provider: See Shea Evans

## 2017-12-24 NOTE — Progress Notes (Signed)
Subjective: She was brought to the hospital with acute kidney injury.  She apparently not been eating or drinking well at her assisted living facility.  Her assisted living facility has had a substantial outbreak of influenza and other viral illnesses and she may have got something from there.  She is very confused this morning.  Objective: Vital signs in last 24 hours: Temp:  [97.6 F (36.4 C)-98 F (36.7 C)] 97.8 F (36.6 C) (03/21 0500) Pulse Rate:  [81-90] 84 (03/21 0500) Resp:  [16-19] 16 (03/21 0500) BP: (93-118)/(55-77) 114/64 (03/21 0500) SpO2:  [84 %-100 %] 99 % (03/21 0500) Weight:  [39.7 kg (87 lb 8.4 oz)-43.1 kg (95 lb)] 39.7 kg (87 lb 8.4 oz) (03/21 0100) Weight change:  Last BM Date: 12/23/17  Intake/Output from previous day: 03/20 0701 - 03/21 0700 In: 356.3 [I.V.:356.3] Out: -   PHYSICAL EXAM General appearance: alert and Confused Resp: clear to auscultation bilaterally Cardio: regular rate and rhythm, S1, S2 normal, no murmur, click, rub or gallop GI: soft, non-tender; bowel sounds normal; no masses,  no organomegaly Extremities: extremities normal, atraumatic, no cyanosis or edema Mucous membranes are dry.  Skin turgor is poor  Lab Results:  Results for orders placed or performed during the hospital encounter of 12/23/17 (from the past 48 hour(s))  Urinalysis, Routine w reflex microscopic     Status: None   Collection Time: 12/23/17  8:03 PM  Result Value Ref Range   Color, Urine YELLOW YELLOW   APPearance CLEAR CLEAR   Specific Gravity, Urine 1.012 1.005 - 1.030   pH 6.0 5.0 - 8.0   Glucose, UA NEGATIVE NEGATIVE mg/dL   Hgb urine dipstick NEGATIVE NEGATIVE   Bilirubin Urine NEGATIVE NEGATIVE   Ketones, ur NEGATIVE NEGATIVE mg/dL   Protein, ur NEGATIVE NEGATIVE mg/dL   Nitrite NEGATIVE NEGATIVE   Leukocytes, UA NEGATIVE NEGATIVE    Comment: Performed at [facility], [address], [place], [place] [address]  Sodium, urine, random     Status: None    Collection Time: 12/23/17  8:03 PM  Result Value Ref Range   Sodium, Ur 79 mmol/L    Comment: Performed at [facility], [address] [place], [place] [address]  Creatinine, urine, random     Status: None   Collection Time: 12/23/17  8:03 PM  Result Value Ref Range   Creatinine, Urine 59.23 mg/dL    Comment: Performed at [facility], [address] [place], [place] [address]  CBC with Differential     Status: Abnormal   Collection Time: 12/23/17 10:04 PM  Result Value Ref Range   WBC 10.2 4.0 - 10.5 K/uL   RBC 3.23 (L) 3.87 - 5.11 MIL/uL   Hemoglobin 9.6 (L) 12.0 - 15.0 g/dL   HCT 30.5 (L) 36.0 - 46.0 %   MCV 94.4 78.0 - 100.0 fL   MCH 29.7 26.0 - 34.0 pg   MCHC 31.5 30.0 - 36.0 g/dL   RDW 16.8 (H) 11.5 - 15.5 %   Platelets 222 150 - 400 K/uL   Neutrophils Relative % 82 %   Neutro Abs 8.4 (H) 1.7 - 7.7 K/uL   Lymphocytes Relative 10 %   Lymphs Abs 1.0 0.7 - 4.0 K/uL   Monocytes Relative 7 %   Monocytes Absolute 0.7 0.1 - 1.0 K/uL   Eosinophils Relative 1 %   Eosinophils Absolute 0.1 0.0 - 0.7 K/uL   Basophils Relative 0 %   Basophils Absolute 0.0 0.0 - 0.1 K/uL  Comment: Performed at [facility], [address] [place], [place] [address]  Basic metabolic panel     Status: Abnormal   Collection Time: 12/23/17 10:04 PM  Result Value Ref Range   Sodium 139 135 - 145 mmol/L   Potassium 2.8 (L) 3.5 - 5.1 mmol/L   Chloride 97 (L) 101 - 111 mmol/L   CO2 24 22 - 32 mmol/L   Glucose, Bld 195 (H) 65 - 99 mg/dL   BUN 30 (H) 6 - 20 mg/dL   Creatinine, Ser 2.03 (H) 0.44 - 1.00 mg/dL   Calcium 7.8 (L) 8.9 - 10.3 mg/dL   GFR calc non Af Amer 19 (L) >60 mL/min   GFR calc Af Amer 22 (L) >60 mL/min    Comment: (NOTE) The eGFR has been calculated using the CKD EPI equation. This calculation has not been validated in all clinical situations. eGFR's persistently <60 mL/min signify possible Chronic Kidney Disease.    Anion gap 18 (H) 5 - 15    Comment: Performed at [facility], [address], [place], [place] [address]  Glucose, capillary     Status: Abnormal   Collection Time: 12/24/17  4:19 AM  Result Value Ref Range   Glucose-Capillary 155 (H) 65 - 99 mg/dL  Basic metabolic panel     Status: Abnormal   Collection Time: 12/24/17  5:55 AM  Result Value Ref Range   Sodium 140 135 - 145 mmol/L   Potassium 3.3 (L) 3.5 - 5.1 mmol/L   Chloride 105 101 - 111 mmol/L   CO2 21 (L) 22 - 32 mmol/L   Glucose, Bld 154 (H) 65 - 99 mg/dL   BUN 29 (H) 6 - 20 mg/dL   Creatinine, Ser 1.88 (H) 0.44 - 1.00 mg/dL   Calcium 7.0 (L) 8.9 - 10.3 mg/dL   GFR calc non Af Amer 21 (L) >60 mL/min   GFR calc Af Amer 24 (L) >60 mL/min    Comment: (NOTE) The eGFR has been calculated using the CKD EPI equation. This calculation has not been validated in all clinical situations. eGFR's persistently <60 mL/min signify possible Chronic Kidney Disease.    Anion gap 14 5 - 15    Comment: Performed at [facility], [address], [place], [place] [address]  CBC     Status: Abnormal   Collection Time: 12/24/17  5:55 AM  Result Value Ref Range   WBC 9.8 4.0 - 10.5 K/uL   RBC 2.88 (L) 3.87 - 5.11 MIL/uL   Hemoglobin 8.6 (L) 12.0 - 15.0 g/dL   HCT 27.4 (L) 36.0 - 46.0 %   MCV 95.1 78.0 - 100.0 fL   MCH 29.9 26.0 - 34.0 pg   MCHC 31.4 30.0 - 36.0 g/dL   RDW 16.9 (H) 11.5 - 15.5 %   Platelets 177 150 - 400 K/uL    Comment: Performed at [facility], [address] [place], [place] [address]  Magnesium     Status: Abnormal   Collection Time: 12/24/17  5:55 AM  Result Value Ref Range   Magnesium 1.3 (L) 1.7 - 2.4 mg/dL    Comment: Performed at [facility], [address], [place], [place] [address]  Glucose, capillary     Status: Abnormal   Collection Time: 12/24/17  8:21 AM  Result Value Ref Range   Glucose-Capillary 144 (H) 65 - 99 mg/dL    ABGS No results for input(s): PHART, PO2ART, TCO2, HCO3 in the last 72 hours.  Invalid input(s):  PCO2 CULTURES No results found for this  or any previous visit (from the past 240 hour(s)). Studies/Results: No results found.  Medications:  Prior to Admission:  Medications Prior to Admission  Medication Sig Dispense Refill Last Dose  . acetaminophen (TYLENOL) 500 MG tablet Take 1,000 mg by mouth 3 (three) times daily.   Taking  . benzocaine-resorcinol (VAGISIL) 5-2 % vaginal cream Place 1 Applicatorful vaginally daily as needed for itching or irritation.   Taking  . cyanocobalamin 100 MCG tablet Take 100 mcg by mouth daily.   Taking  . ferrous sulfate 325 (65 FE) MG tablet Take 325 mg by mouth daily with breakfast.   Taking  . furosemide (LASIX) 40 MG tablet    Taking  . guaiFENesin-dextromethorphan (ROBITUSSIN DM) 100-10 MG/5ML syrup Take 10 mLs by mouth every 6 (six) hours as needed for cough.   Taking  . levothyroxine (SYNTHROID, LEVOTHROID) 25 MCG tablet    Taking  . metFORMIN (GLUCOPHAGE-XR) 500 MG 24 hr tablet Take 500 mg by mouth daily with breakfast.    Taking  . Multiple Vitamin (MULTIVITAMIN) tablet Take 1 tablet by mouth daily.   Taking  . pantoprazole (PROTONIX) 40 MG tablet Take 40 mg by mouth daily.   Taking  . sertraline (ZOLOFT) 100 MG tablet Take 1 tablet by mouth daily.   Taking  . traMADol (ULTRAM) 50 MG tablet Take 50 mg by mouth every 6 (six) hours as needed for moderate pain. Pain not relieved by Tylenol.   Taking   Scheduled: . ferrous sulfate  325 mg Oral Q breakfast  . insulin aspart  0-9 Units Subcutaneous Q4H  . levothyroxine  25 mcg Oral QAC breakfast  . multivitamin with minerals  1 tablet Oral Daily  . pantoprazole  40 mg Oral Daily  . sertraline  100 mg Oral Daily  . sodium chloride flush  3 mL Intravenous Q12H  . cyanocobalamin  100 mcg Oral Daily   Continuous: . sodium chloride    . 0.9 % NaCl with KCl 40 mEq / L 75 mL/hr (12/24/17 0115)   TZG:[id number] **OR** acetaminophen, bisacodyl, guaiFENesin-dextromethorphan, ondansetron **OR** ondansetron (ZOFRAN) IV, senna-docusate,  traMADol  Assesment: She was admitted with acute kidney injury.  She was not eating or drinking and there was some question as to whether she had some dysphagia so she is going to have a speech evaluation.  Her kidney function is better but not back to normal.  She has hypokalemia and low magnesium level.  Potassium is being replaced and magnesium will be replaced  She has diabetes and her blood sugar is okay at the moment  She has chronic iron deficiency anemia from AV malformations in her stomach and her hemoglobin has gone down some but I think some of that may be delutional  She has severe dementia and she is much more confused than usual   Principal Problem:   AKI (acute kidney injury) ([place]) Active Problems:   Diabetes mellitus type 2 in nonobese ([place])   Hypothyroidism   Iron deficiency anemia   AVM (arteriovenous malformation) of stomach, acquired with hemorrhage    Plan: Replace magnesium.  Continue other treatments.    LOS: 0 days   Brittish Bolinger L 12/24/2017, 8:51 AM

## 2017-12-24 NOTE — H&P (Signed)
History and Physical    EDDY Santos QBH:[id number] DOB: 08-Aug-1917 DOA: 12/23/2017  PCP: Kara Du, MD   Patient coming from: Nursing home  Chief Complaint: Not eating or drinking   HPI: Kara Santos is a 84 y.o. female with medical history significant for Alzheimer's dementia chronic iron deficiency anemia attributed to chronic, blood loss from gastric AVM, type 2 diabetes mellitus, and hypothyroidism, now presenting to the emergency department for evaluation of not eating or drinking.  Patient is accompanied by her daughter who assist with the history.  Family was notified today by the nursing facility that the patient was being transported to the emergency department for evaluation of not eating or drinking.  Patient herself has not voiced any complaints and seems to be in her usual state per report of family.  She is asking for water in the emergency department, drinking with no apparent difficulty or choking.  ED Course: Upon arrival to the ED, patient is found to beafebrile, saturating adequately on room air, and with vitals otherwise stable. Chemistry panel is notable for a potassium of 2.8 and creatinine of 2.03, up from 0.75 earlier this month.  CBC is notable for stable normocytic anemia with hemoglobin of 9.6.  Urinalysis is unremarkable.  Patient was treated with 1 L of normal saline in the emergency department and given a 60 mEq oral potassium supplement but she was unable to swallow this.  She remains hemodynamically stable and in no apparent respiratory distress in the emergency department and will be admitted to the medical-surgical unit for ongoing evaluation and management of acute kidney injury, likely prerenal and secondary to not eating or drinking with concern for possible dysphasia.  Review of Systems:  All other systems reviewed and apart from HPI, are negative.  Past Medical History:  Diagnosis Date  . Alzheimer's disease, early onset 67  . Anemia 11/07/2011   chronic IDA requiring multiple transfusions  . AVM (arteriovenous malformation) of stomach, acquired with hemorrhage 12/04/2013  . Closed fracture of greater trochanter of femur ([place]) 02/2013   left  . Depression   . Diabetes mellitus   . Fall at home 11/17/14   multiple rib fractures  . GERD (gastroesophageal reflux disease)   . Hypothyroidism    s/p thyroid surgery for thyroid cancer  . Iron deficiency anemia 05/05/2012    Past Surgical History:  Procedure Laterality Date  . APPENDECTOMY    . bilateral cataract extractions    . BREAST REDUCTION SURGERY    . CHOLECYSTECTOMY    . COLONOSCOPY  08/2006   Dr. Cecelia Byars sided diverticulosis  . double balloon enteroscopy  06/1008   [facility]-->APC of 12 SB AVMs in jejunum. 6 feet of jejunum inspected  . ESOPHAGOGASTRODUODENOSCOPY  08/2006   Dr. Trevor Iha [facility], large duodenal diverticulum  . Givens small bowel capsule  08/2006   few gastric erosions, numerous small bowel AVMs with SB erosions  . right shoulder surgery    . THYROID SURGERY     thyroid cancer     reports that  has never smoked. she has never used smokeless tobacco. She reports that she does not drink alcohol or use drugs.  Allergies  Allergen Reactions  . Asa [Aspirin]     Acid reflux     Family History  Problem Relation Age of Onset  . Cancer Mother   . Cancer Father   . Diabetes Father   . Diabetes Sister   . Cancer Brother      Prior  to Admission medications   Medication Sig Start Date End Date Taking? Authorizing Provider  acetaminophen (TYLENOL) 500 MG tablet Take 1,000 mg by mouth 3 (three) times daily.    [provider]  benzocaine-resorcinol (VAGISIL) 5-2 % vaginal cream Place 1 Applicatorful vaginally daily as needed for itching or irritation.    [provider]  cyanocobalamin 100 MCG tablet Take 100 mcg by mouth daily.    [provider]  ferrous sulfate 325 (65 FE) MG tablet Take 325 mg by mouth daily with  breakfast.    [provider]  furosemide (LASIX) 40 MG tablet  08/19/16   [provider]  guaiFENesin-dextromethorphan (ROBITUSSIN DM) 100-10 MG/5ML syrup Take 10 mLs by mouth every 6 (six) hours as needed for cough.    [provider]  levothyroxine (SYNTHROID, LEVOTHROID) 25 MCG tablet  08/12/17   [provider]  metFORMIN (GLUCOPHAGE-XR) 500 MG 24 hr tablet Take 500 mg by mouth daily with breakfast.     [provider]  Multiple Vitamin (MULTIVITAMIN) tablet Take 1 tablet by mouth daily.    [provider]  pantoprazole (PROTONIX) 40 MG tablet Take 40 mg by mouth daily.    [provider]  sertraline (ZOLOFT) 100 MG tablet Take 1 tablet by mouth daily. 12/05/17   [provider]  traMADol (ULTRAM) 50 MG tablet Take 50 mg by mouth every 6 (six) hours as needed for moderate pain. Pain not relieved by Tylenol.    [provider]    Physical Exam: Vitals:   12/23/17 2130 12/23/17 2200 12/23/17 2230 12/23/17 2300  BP: 105/64 93/77 101/68 (!) 101/59  Pulse: 85 84 86 83  Resp:      Temp:      TempSrc:      SpO2: 97% (!) 84% 90% 99%  Weight:      Height:          Constitutional: NAD, calm  Eyes: PERTLA, lids and conjunctivae normal ENMT: Mucous membranes are dry. Posterior pharynx clear of any exudate or lesions.   Neck: normal, supple, no masses, no thyromegaly Respiratory: clear to auscultation bilaterally, no wheezing, no crackles. Normal respiratory effort.    Cardiovascular: S1 & S2 heard, regular rate and rhythm. No significant JVD. Abdomen: No distension, no tenderness. Bowel sounds active.  Musculoskeletal: no clubbing / cyanosis. No joint deformity upper and lower extremities.    Skin: no significant rashes, lesions, ulcers. Warm, dry, well-perfused. Neurologic: CN 2-12 grossly intact. Sensation intact. Strength 5/5 in all 4 limbs. Gross hearing deficit.  Psychiatric:  Alert and oriented to person  and place only. Pleasant and cooperative.      Labs on Admission: I have personally reviewed following labs and imaging studies  CBC: Recent Labs  Lab 12/23/17 2204  WBC 10.2  NEUTROABS 8.4*  HGB 9.6*  HCT 30.5*  MCV 94.4  PLT 245   Basic Metabolic Panel: Recent Labs  Lab 12/23/17 2204  NA 139  K 2.8*  CL 97*  CO2 24  GLUCOSE 195*  BUN 30*  CREATININE 2.03*  CALCIUM 7.8*   GFR: Estimated Creatinine Clearance: 10.3 mL/min (A) (by C-G formula based on SCr of 2.03 mg/dL (H)). Liver Function Tests: No results for input(s): AST, ALT, ALKPHOS, BILITOT, PROT, ALBUMIN in the last 168 hours. No results for input(s): LIPASE, AMYLASE in the last 168 hours. No results for input(s): AMMONIA in the last 168 hours. Coagulation Profile: No results for input(s): INR, PROTIME in the last  168 hours. Cardiac Enzymes: No results for input(s): CKTOTAL, CKMB, CKMBINDEX, TROPONINI in the last 168 hours. BNP (last 3 results) No results for input(s): PROBNP in the last 8760 hours. HbA1C: No results for input(s): HGBA1C in the last 72 hours. CBG: No results for input(s): GLUCAP in the last 168 hours. Lipid Profile: No results for input(s): CHOL, HDL, LDLCALC, TRIG, CHOLHDL, LDLDIRECT in the last 72 hours. Thyroid Function Tests: No results for input(s): TSH, T4TOTAL, FREET4, T3FREE, THYROIDAB in the last 72 hours. Anemia Panel: No results for input(s): VITAMINB12, FOLATE, FERRITIN, TIBC, IRON, RETICCTPCT in the last 72 hours. Urine analysis:    Component Value Date/Time   COLORURINE YELLOW 12/23/2017 2003   APPEARANCEUR CLEAR 12/23/2017 2003   LABSPEC 1.012 12/23/2017 2003   PHURINE 6.0 12/23/2017 2003   GLUCOSEU NEGATIVE 12/23/2017 2003   HGBUR NEGATIVE 12/23/2017 2003   [place] NEGATIVE 12/23/2017 2003   [place] 12/23/2017 2003   PROTEINUR NEGATIVE 12/23/2017 2003   UROBILINOGEN 0.2 08/07/2015 1235   NITRITE NEGATIVE 12/23/2017 2003   LEUKOCYTESUR NEGATIVE  12/23/2017 2003   Sepsis Labs: @LABRCNTIP (procalcitonin:4,lacticidven:4) )No results found for this or any previous visit (from the past 240 hour(s)).   Radiological Exams on Admission: No results found.  EKG: Not performed.   Assessment/Plan  1. Acute kidney injury; ?dysphagia   - Presents with reports of not eating or drinking today at nursing facility  - She is asking for water in ED and drinking without apparent difficulty or choking, but could not tolerate an oral potassium supplement  - She is found to have SCr of 2.03 on admission, up from 0.75 earlier this month  - Likely a prerenal azotemia in setting of not eating or drinking  - Treated in ED with 1 liter NS  - Continue IVF hydration, check urine chemistries, consult with SLP for swallow eval, renally-dose medications, avoid nephrotoxins, repeat chem panel in am    2. Anemia; chronic GI blood-loss; gastric AVM - Hgb is 9.6 on admission, stable  - She has chronic IDA attributed to chronic GI blood-loss from AVM and managed with iron infusions q54mos   3. Hypothyroidism  - Continue Synthroid   4. Type II DM  - No recent A1c on file  - Managed with metformin at home, held on admission  - Follow CBGs and start SSI with Novolog while in hospital    5. Hypokalemia  - Serum potassium is 2.8 on admission  - Given 60 mEq oral supplement in ED, but was unable to swallow it  - Treat with IV KCl and magnesium, continue cardiac monitoring, repeat chem panel in am     DVT prophylaxis: SCD's  Code Status: DNR Family Communication: Daughter updated at bedside Consults called: None Admission status: Inpatient    Vianne Bulls, MD [facility] Pager [phone]  If 7PM-7AM, please contact night-coverage [email or web address] Password [email or web address]  12/24/2017, 12:12 AM

## 2017-12-24 NOTE — Progress Notes (Signed)
Initial Nutrition Assessment  DOCUMENTATION CODES:   Underweight, Severe malnutrition in context of chronic illness  INTERVENTION:   - Ensure Enlive po BID, each supplement provides 350 kcal and 20 grams of protein - Continue MVI with minerals - Encourage PO intake  Monitor magnesium, potassium, and phosphorus daily for at least 3 days, MD to replete as needed, as pt is at risk for refeeding syndrome given severe protein-calorie malnutrition, limited PO intake in days prior to admission, and significant weight loss.  NUTRITION DIAGNOSIS:   Severe Malnutrition related to chronic illness(dementia) as evidenced by severe fat depletion, severe muscle depletion, percent weight loss(8.7% in 3.5 months).  GOAL:   Patient will meet greater than or equal to 90% of their needs  MONITOR:   PO intake, Supplement acceptance, Labs, Weight trends  REASON FOR ASSESSMENT:   Other (Comment)(low BMI)   ASSESSMENT:   84 year old female who presented to ED from SNF after refusing to take her medications and not eating or drinking. Pt with PMH significant for Alzheimer's, anemia, depression, DM type 2, and GERD. Pt admitted for AKI. Concern for dysphagia.  Spoke with pt at bedside. Pt reports her "mind is better today" and expresses appreciation for staff at [facility]. RD noted consult to palliative care.  Pt reports having a poor appetite both PTA and currently. RD noted unfinished lunch meal tray in room at time of visit. Pt had consumed a few bites of cake, some sips of beverages, and some sweet potatoes. RD encouraged pt to eat more if she was able, but pt states, "I'm just not hungry." Noted that pt spit out a bite of chewed up meat.  RD asked if pt would be interested in receiving Ensure Enlive oral nutrition supplements, and pt stated she would. RD to order.  Pt unable to provide a detailed diet and weight history. No family members in room at time of RD visit. Pt was able to report her UBW  as "just over 100 lbs." Pt unsure when she last weighed this. Pt endorses weight loss stating, "Look at my arms!" Per weight history in chart, pt has lost approximately 8.5 lbs in 3.5 months (8.7% weight loss, significant for timeframe).  Medications reviewed and include: 325 mg ferrous sulfate daily, sliding scale Novolog, 25 mcg levothyroxine, MVI with minerals, 40 mg Protonix daily, 100 mcg vitamin B-12 daily  Labs reviewed: potassium 3.3, CO2 21, BUN 29, creatinine 1.88, calcium 7.0, magnesium 1.3, hemoglobin 8.6, HCT 27.4 CBGs: 173, 144, 155  NUTRITION - FOCUSED PHYSICAL EXAM:    Most Recent Value  Orbital Region  Moderate depletion  Upper Arm Region  Moderate depletion  Thoracic and Lumbar Region  Severe depletion  Buccal Region  Severe depletion  Temple Region  Moderate depletion  Clavicle Bone Region  Severe depletion  Scapular Bone Region  Severe depletion  Dorsal Hand  Moderate depletion  Patellar Region  Severe depletion  Anterior Thigh Region  Moderate depletion  Posterior Calf Region  Severe depletion  Edema (RD Assessment)  None  Hair  Reviewed  Eyes  Reviewed  Mouth  Reviewed  Skin  Reviewed  Nails  Reviewed       Diet Order:  Diet regular Room service appropriate? Yes; Fluid consistency: Thin  EDUCATION NEEDS:   No education needs have been identified at this time  Skin:  Skin Assessment: Reviewed RN Assessment  Last BM:  12/23/17  Height:   Ht Readings from Last 1 Encounters:  12/24/17 5' (1.524 m)  Weight:   Wt Readings from Last 1 Encounters:  12/24/17 87 lb 8.4 oz (39.7 kg)    Ideal Body Weight:  56.8 kg  BMI:  Body mass index is 17.09 kg/m.  Estimated Nutritional Needs:   Kcal:  1200-1400 kcal/day  Protein:  50-60 grams/day  Fluid:  1.2-1.4 L/day    Gaynell Face, MS, RD, LDN Pager: [phone] Weekend/After Hours: [phone]

## 2017-12-24 NOTE — Progress Notes (Signed)
SLP Cancellation Note  Patient Details Name: Kara Santos MRN: [id number] DOB: [birth year]   Cancelled treatment:       Reason Eval/Treat Not Completed: Other (comment)(Pt with nursing for self care); SLP will check back later for BSE.  Thank you,  Genene Churn, [place]    [place] 12/24/2017, 2:20 PM

## 2017-12-25 DIAGNOSIS — E43 Unspecified severe protein-calorie malnutrition: Secondary | ICD-10-CM

## 2017-12-25 LAB — GLUCOSE, CAPILLARY
GLUCOSE-CAPILLARY: 153 mg/dL — AB (ref 65–99)
GLUCOSE-CAPILLARY: 220 mg/dL — AB (ref 65–99)
GLUCOSE-CAPILLARY: 80 mg/dL (ref 65–99)
GLUCOSE-CAPILLARY: 90 mg/dL (ref 65–99)
Glucose-Capillary: 113 mg/dL — ABNORMAL HIGH (ref 65–99)
Glucose-Capillary: 127 mg/dL — ABNORMAL HIGH (ref 65–99)
Glucose-Capillary: 215 mg/dL — ABNORMAL HIGH (ref 65–99)

## 2017-12-25 LAB — CBC WITH DIFFERENTIAL/PLATELET
BASOS ABS: 0 10*3/uL (ref 0.0–0.1)
BASOS PCT: 0 %
EOS PCT: 2 %
Eosinophils Absolute: 0.2 10*3/uL (ref 0.0–0.7)
HCT: 27.1 % — ABNORMAL LOW (ref 36.0–46.0)
Hemoglobin: 8.3 g/dL — ABNORMAL LOW (ref 12.0–15.0)
LYMPHS PCT: 11 %
Lymphs Abs: 0.8 10*3/uL (ref 0.7–4.0)
MCH: 29.4 pg (ref 26.0–34.0)
MCHC: 30.6 g/dL (ref 30.0–36.0)
MCV: 96.1 fL (ref 78.0–100.0)
MONO ABS: 0.4 10*3/uL (ref 0.1–1.0)
Monocytes Relative: 6 %
NEUTROS ABS: 5.6 10*3/uL (ref 1.7–7.7)
Neutrophils Relative %: 81 %
PLATELETS: 155 10*3/uL (ref 150–400)
RBC: 2.82 MIL/uL — ABNORMAL LOW (ref 3.87–5.11)
RDW: 16.6 % — AB (ref 11.5–15.5)
WBC: 7 10*3/uL (ref 4.0–10.5)

## 2017-12-25 LAB — BASIC METABOLIC PANEL
Anion gap: 9 (ref 5–15)
BUN: 17 mg/dL (ref 6–20)
CO2: 20 mmol/L — ABNORMAL LOW (ref 22–32)
Calcium: 7.4 mg/dL — ABNORMAL LOW (ref 8.9–10.3)
Chloride: 115 mmol/L — ABNORMAL HIGH (ref 101–111)
Creatinine, Ser: 0.92 mg/dL (ref 0.44–1.00)
GFR calc Af Amer: 58 mL/min — ABNORMAL LOW (ref >60)
GFR, EST NON AFRICAN AMERICAN: 50 mL/min — AB (ref >60)
GLUCOSE: 133 mg/dL — AB (ref 65–99)
POTASSIUM: 3.2 mmol/L — AB (ref 3.5–5.1)
SODIUM: 144 mmol/L (ref 135–145)

## 2017-12-25 LAB — MAGNESIUM: MAGNESIUM: 2.1 mg/dL (ref 1.7–2.4)

## 2017-12-25 LAB — UREA NITROGEN, URINE: Urea Nitrogen, Ur: 330 mg/dL

## 2017-12-25 MED ORDER — MUPIROCIN 2 % EX OINT
1.0000 "application " | TOPICAL_OINTMENT | Freq: Two times a day (BID) | CUTANEOUS | Status: DC
  Administered 2017-12-25 – 2017-12-26 (×3): 1 via NASAL
  Filled 2017-12-25: qty 22

## 2017-12-25 MED ORDER — CHLORHEXIDINE GLUCONATE CLOTH 2 % EX PADS: 6.0000 | MEDICATED_PAD | Freq: Every day | CUTANEOUS | Status: DC

## 2017-12-25 NOTE — Care Management (Signed)
CM consult to make referral to [facility]. CM contacted Hospice provider and they say Kara Santos of [facility] must contact them with referral. CM has made CSW aware.

## 2017-12-25 NOTE — NC FL2 (Signed)
[place] MEDICAID FL2 LEVEL OF CARE SCREENING TOOL     IDENTIFICATION  Patient Name: Kara Santos Birthdate: 1916-12-01 Sex: female Admission Date (Current Location): 12/23/2017  [facility] and [place] Number:  [employer] and Address:  [facility] [address] [place]      Provider Number: [id number]  Attending Physician Name and Address:  Sinda Du, MD  Relative Name and Phone Number:       Current Level of Care: Hospital Recommended Level of Care: [place] Prior Approval Number:    Date Approved/Denied:   PASRR Number:    Discharge Plan: Other (Comment)(ALF)    Current Diagnoses: Patient Active Problem List   Diagnosis Date Noted  . Protein-calorie malnutrition, severe 12/25/2017  . Dehydration   . Goals of care, counseling/discussion   . Palliative care by specialist   . Encounter for hospice care discussion   . AKI (acute kidney injury) ([place]) 12/23/2017  . GI bleed 08/07/2015  . Mandibular fracture, closed ([place]) 08/07/2015  . Acute encephalopathy 08/07/2015  . Hematochezia 08/07/2015  . Fall 11/20/2014  . Multiple rib fractures 11/17/2014  . AVM (arteriovenous malformation) of stomach, acquired with hemorrhage 12/04/2013  . Closed fracture of greater trochanter of femur ([place]) 02/06/2013  . History of fall 02/06/2013  . Iron deficiency anemia 05/05/2012  . Chronic gastrointestinal bleeding 03/04/2012  . Diabetes mellitus type 2 in nonobese ([place]) 03/04/2012  . Hypothyroidism 03/04/2012  . History of thyroid cancer 03/04/2012    Orientation RESPIRATION BLADDER Height & Weight     Self, Place  Normal Incontinent Weight: 87 lb 8.4 oz (39.7 kg) Height:  5' (152.4 cm)  BEHAVIORAL SYMPTOMS/MOOD NEUROLOGICAL BOWEL NUTRITION STATUS      Incontinent Diet(regular)  AMBULATORY STATUS COMMUNICATION OF NEEDS Skin   Independent(walker) Verbally Normal                       Personal Care Assistance  Level of Assistance  Bathing, Feeding, Dressing Bathing Assistance: Limited assistance Feeding assistance: Independent Dressing Assistance: Limited assistance     Functional Limitations Info  Sight, Hearing, Speech Sight Info: Adequate Hearing Info: Adequate Speech Info: Adequate    SPECIAL CARE FACTORS FREQUENCY                       Contractures Contractures Info: Not present    Additional Factors Info  Code Status, Allergies, Psychotropic Code Status Info: DNR Allergies Info: [place] Psychotropic Info: Zoloft         Current Medications (12/25/2017):  This is the current hospital active medication list Current Facility-Administered Medications  Medication Dose Route Frequency Provider Last Rate Last Dose  . 0.9 %  sodium chloride infusion   Intravenous Continuous Sinda Du, MD 75 mL/hr at 12/25/17 0220    . acetaminophen (TYLENOL) tablet 650 mg  650 mg Oral Q6H PRN Opyd, Ilene Qua, MD       Or  . acetaminophen (TYLENOL) suppository 650 mg  650 mg Rectal Q6H PRN Opyd, Ilene Qua, MD      . bisacodyl (DULCOLAX) EC tablet 5 mg  5 mg Oral Daily PRN Opyd, Ilene Qua, MD      . Derrill Memo ON 12/26/2017] Chlorhexidine Gluconate Cloth 2 % PADS 6 each  6 each Topical Q0600 Sinda Du, MD      . feeding supplement (ENSURE ENLIVE) (ENSURE ENLIVE) liquid 237 mL  237 mL Oral TID BM Sinda Du,  MD   237 mL at 12/25/17 1307  . ferrous sulfate tablet 325 mg  325 mg Oral Q breakfast Opyd, Ilene Qua, MD   325 mg at 12/25/17 0907  . guaiFENesin-dextromethorphan (ROBITUSSIN DM) 100-10 MG/5ML syrup 10 mL  10 mL Oral Q6H PRN Opyd, Ilene Qua, MD      . insulin aspart (novoLOG) injection 0-9 Units  0-9 Units Subcutaneous Q4H Opyd, Ilene Qua, MD   3 Units at 12/25/17 1307  . levothyroxine (SYNTHROID, LEVOTHROID) tablet 25 mcg  25 mcg Oral QAC breakfast Opyd, Ilene Qua, MD   25 mcg at 12/25/17 0631  . multivitamin with minerals tablet 1 tablet  1 tablet Oral Daily Opyd, Ilene Qua, MD   1  tablet at 12/25/17 0907  . mupirocin ointment (BACTROBAN) 2 % 1 application  1 application Nasal BID Sinda Du, MD      . ondansetron [facility]) tablet 4 mg  4 mg Oral Q6H PRN Opyd, Ilene Qua, MD       Or  . ondansetron (ZOFRAN) injection 4 mg  4 mg Intravenous Q6H PRN Opyd, Ilene Qua, MD      . pantoprazole (PROTONIX) EC tablet 40 mg  40 mg Oral Daily Opyd, Ilene Qua, MD   40 mg at 12/25/17 0907  . senna-docusate (Senokot-S) tablet 1 tablet  1 tablet Oral QHS PRN Opyd, Ilene Qua, MD      . sertraline (ZOLOFT) tablet 100 mg  100 mg Oral Daily Opyd, Ilene Qua, MD   100 mg at 12/25/17 0907  . sodium chloride flush (NS) 0.9 % injection 3 mL  3 mL Intravenous Q12H Opyd, Ilene Qua, MD   3 mL at 12/25/17 0906  . traMADol (ULTRAM) tablet 50 mg  50 mg Oral Q6H PRN Opyd, Ilene Qua, MD      . vitamin B-12 (CYANOCOBALAMIN) tablet 100 mcg  100 mcg Oral Daily Opyd, Ilene Qua, MD   100 mcg at 12/25/17 0907     Discharge Medications: Medication List    STOP taking these medications   furosemide 40 MG tablet Commonly known as:  LASIX   levofloxacin 500 MG tablet Commonly known as:  LEVAQUIN     TAKE these medications   acetaminophen 500 MG tablet Commonly known as:  TYLENOL Take 1,000 mg by mouth 3 (three) times daily.   benzocaine-resorcinol 5-2 % vaginal cream Commonly known as:  VAGISIL Place 1 Applicatorful vaginally daily as needed for itching or irritation.   cyanocobalamin 100 MCG tablet Take 100 mcg by mouth daily.   ferrous sulfate 325 (65 FE) MG tablet Take 325 mg by mouth daily with breakfast.   guaiFENesin-dextromethorphan 100-10 MG/5ML syrup Commonly known as:  ROBITUSSIN DM Take 10 mLs by mouth every 6 (six) hours as needed for cough.   levothyroxine 25 MCG tablet Commonly known as:  SYNTHROID, LEVOTHROID Take 25 mcg by mouth daily before breakfast.   loperamide 2 MG tablet Commonly known as:  IMODIUM A-D Take 2 mg by mouth every 2 (two) hours as needed  for diarrhea or loose stools.   metFORMIN 500 MG 24 hr tablet Commonly known as:  GLUCOPHAGE-XR Take 500 mg by mouth daily with breakfast.   multivitamin tablet Take 1 tablet by mouth daily.   ondansetron 4 MG tablet Commonly known as:  ZOFRAN Take 4 mg by mouth 4 (four) times daily as needed for nausea or vomiting.   oseltamivir 75 MG capsule Commonly known as:  TAMIFLU Take 75 mg by mouth daily.  pantoprazole 40 MG tablet Commonly known as:  PROTONIX Take 40 mg by mouth daily.   sertraline 100 MG tablet Commonly known as:  ZOLOFT Take 1 tablet by mouth daily.   sulfamethoxazole-trimethoprim 800-160 MG tablet Commonly known as:  BACTRIM DS,SEPTRA DS Take 1 tablet by mouth 2 (two) times daily.   traMADol 50 MG tablet Commonly known as:  ULTRAM Take 50 mg by mouth every 6 (six) hours as needed for moderate pain. Pain not relieved by Tylenol.        Relevant Imaging Results:  Relevant Lab Results:   Additional Information 3.21.19 mrsa by pcr  Hydia Copelin, Clydene Pugh, LCSW

## 2017-12-25 NOTE — Care Management (Signed)
Patient Information   Patient Name Kara Santos, Kara Santos ([id number]) Sex Female DOB [birth year]  Room Bed  A318 A318-01  Patient Demographics   Address [place] [address] Phone [phone] (Home)  Patient Ethnicity & Race   Ethnic Group Patient Race  Not Hispanic or Latino White or Caucasian  Emergency Contact(s)   Name Relation Home Work Mobile  Kara Santos Daughter [phone]    Documents on File    Status Date Received Description  Documents for the Patient  EMR Patient Summary Not Received    [place] Received 03/06/11   [place] E-Signature HIPAA Notice of Privacy Received 11/08/10   [place] E-Signature HIPAA Notice of Privacy Spanish Not Received    Driver's License Received 39/76/73   Advance Directives/Living Will/HCPOA/POA Not Received    Driver's License Not Received    Historic Radiology Documentation Not Received    Insurance Card Not Received    [place] Not Received    Financial Application Not Received    Insurance Card Received 05/13/12 [place] HIPAA NOTICE OF PRIVACY - Scanned Received 05/13/12 RCGD  HIM ROI Authorization Not Received  RCGD--05/13/12 note to PCP  HIM ROI Authorization Not Received  RCGD--08/02/12 note to PCP  AMB Correspondence Not Received  03/14 [facility]  AMB Correspondence  12/12/13 02/15 Leonard Schwartz MD, E  Other Photo ID Not Received    Release of Information     Insurance Card   NEW MEDICARE 2018  Insurance Card Received 06/09/17   AMB Correspondence (Deleted) 01/03/13 [facility]  Documents for the Encounter  AOB (Assignment of Insurance Benefits) Not Received    E-signature AOB Signed 12/23/17   MEDICARE RIGHTS Not Received    E-signature Medicare Rights Signed 12/23/17   Cardiac Monitoring Strip Received 12/24/17   ED Patient Billing Extract   ED PB Billing Extract  Cardiac Monitoring Strip Shift  Summary Received 12/24/17   Admission Information   Attending Provider Admitting Provider Admission Type Admission Date/Time  Sinda Du, MD Vianne Bulls, MD Emergency 12/23/17 1902  Discharge Date Hospital Service Auth/Cert Status Service Area   Internal Medicine Incomplete [facility]  Unit Room/Bed Admission Status   AP-DEPT 300 A318/A318-01 Admission (Confirmed)   Admission   Complaint  Dysphagia  Hospital Account   Name Acct ID Class Status Primary Coverage  Laniah, Grimm [id number] Inpatient Open MEDICARE - MEDICARE PART A AND B      Guarantor Account (for [place] [id number])   Name Relation to Pt Service Area Active? Acct Type  Birdie Hopes T Self CHSA Yes Personal/Family  Address Phone    [address] [place], [place] [address] [phone])        Coverage Information (for Hospital Account [id number])   1. [place] PART A AND B   F/O Payor/Plan Precert #  MEDICARE/MEDICARE PART A AND B   Subscriber Subscriber #  Gwenyth, Dingee [id number]  Address Phone  [address] [place], [place] [address]   2. CIGNA/GENERIC CIGNA   F/O Payor/Plan Precert #  CIGNA/GENERIC [employer] Subscriber #  Gene, Colee [id number]  Address Phone  [place] [address] [phone]

## 2017-12-25 NOTE — Progress Notes (Signed)
Subjective: She says she feels better.  She is still confused.  She did not appear to have any significant swallowing issues based on bedside swallow evaluation except for her cognitive impairment Objective: Vital signs in last 24 hours: Temp:  [98.1 F (36.7 C)-98.4 F (36.9 C)] 98.4 F (36.9 C) (03/22 0452) Pulse Rate:  [73-86] 73 (03/22 0452) Resp:  [16] 16 (03/21 1500) BP: (102-112)/(50-59) 102/59 (03/22 0452) SpO2:  [87 %-99 %] 95 % (03/22 0452) Weight change:  Last BM Date: 12/24/17  Intake/Output from previous day: 03/21 0701 - 03/22 0700 In: 2220 [P.O.:720; I.V.:1500] Out: -   PHYSICAL EXAM General appearance: alert, cooperative and Confused but less so than yesterday Resp: clear to auscultation bilaterally Cardio: regular rate and rhythm, S1, S2 normal, no murmur, click, rub or gallop GI: soft, non-tender; bowel sounds normal; no masses,  no organomegaly Extremities: extremities normal, atraumatic, no cyanosis or edema  Lab Results:  Results for orders placed or performed during the hospital encounter of 12/23/17 (from the past 48 hour(s))  Urinalysis, Routine w reflex microscopic     Status: None   Collection Time: 12/23/17  8:03 PM  Result Value Ref Range   Color, Urine YELLOW YELLOW   APPearance CLEAR CLEAR   Specific Gravity, Urine 1.012 1.005 - 1.030   pH 6.0 5.0 - 8.0   Glucose, UA NEGATIVE NEGATIVE mg/dL   Hgb urine dipstick NEGATIVE NEGATIVE   Bilirubin Urine NEGATIVE NEGATIVE   Ketones, ur NEGATIVE NEGATIVE mg/dL   Protein, ur NEGATIVE NEGATIVE mg/dL   Nitrite NEGATIVE NEGATIVE   Leukocytes, UA NEGATIVE NEGATIVE    Comment: Performed at [facility], [address] [place], [place] [address]  Sodium, urine, random     Status: None   Collection Time: 12/23/17  8:03 PM  Result Value Ref Range   Sodium, Ur 79 mmol/L    Comment: Performed at [facility], [address] [place], [place] [address]  Creatinine, urine, random     Status: None   Collection  Time: 12/23/17  8:03 PM  Result Value Ref Range   Creatinine, Urine 59.23 mg/dL    Comment: Performed at [facility], [address] [place], [place] [address]  CBC with Differential     Status: Abnormal   Collection Time: 12/23/17 10:04 PM  Result Value Ref Range   WBC 10.2 4.0 - 10.5 K/uL   RBC 3.23 (L) 3.87 - 5.11 MIL/uL   Hemoglobin 9.6 (L) 12.0 - 15.0 g/dL   HCT 30.5 (L) 36.0 - 46.0 %   MCV 94.4 78.0 - 100.0 fL   MCH 29.7 26.0 - 34.0 pg   MCHC 31.5 30.0 - 36.0 g/dL   RDW 16.8 (H) 11.5 - 15.5 %   Platelets 222 150 - 400 K/uL   Neutrophils Relative % 82 %   Neutro Abs 8.4 (H) 1.7 - 7.7 K/uL   Lymphocytes Relative 10 %   Lymphs Abs 1.0 0.7 - 4.0 K/uL   Monocytes Relative 7 %   Monocytes Absolute 0.7 0.1 - 1.0 K/uL   Eosinophils Relative 1 %   Eosinophils Absolute 0.1 0.0 - 0.7 K/uL   Basophils Relative 0 %   Basophils Absolute 0.0 0.0 - 0.1 K/uL    Comment: Performed at [facility], [address], [place],  [address]  Basic metabolic panel     Status: Abnormal   Collection Time: 12/23/17 10:04 PM  Result Value Ref Range   Sodium 139 135 - 145 mmol/L  Potassium 2.8 (L) 3.5 - 5.1 mmol/L   Chloride 97 (L) 101 - 111 mmol/L   CO2 24 22 - 32 mmol/L   Glucose, Bld 195 (H) 65 - 99 mg/dL   BUN 30 (H) 6 - 20 mg/dL   Creatinine, Ser 2.03 (H) 0.44 - 1.00 mg/dL   Calcium 7.8 (L) 8.9 - 10.3 mg/dL   GFR calc non Af Amer 19 (L) >60 mL/min   GFR calc Af Amer 22 (L) >60 mL/min    Comment: (NOTE) The eGFR has been calculated using the CKD EPI equation. This calculation has not been validated in all clinical situations. eGFR's persistently <60 mL/min signify possible Chronic Kidney Disease.    Anion gap 18 (H) 5 - 15    Comment: Performed at [facility], [address] [place], [place] [address]  Glucose, capillary     Status: Abnormal   Collection Time: 12/24/17  4:19 AM  Result Value Ref Range   Glucose-Capillary 155 (H) 65 - 99 mg/dL  Basic metabolic panel     Status:  Abnormal   Collection Time: 12/24/17  5:55 AM  Result Value Ref Range   Sodium 140 135 - 145 mmol/L   Potassium 3.3 (L) 3.5 - 5.1 mmol/L   Chloride 105 101 - 111 mmol/L   CO2 21 (L) 22 - 32 mmol/L   Glucose, Bld 154 (H) 65 - 99 mg/dL   BUN 29 (H) 6 - 20 mg/dL   Creatinine, Ser 1.88 (H) 0.44 - 1.00 mg/dL   Calcium 7.0 (L) 8.9 - 10.3 mg/dL   GFR calc non Af Amer 21 (L) >60 mL/min   GFR calc Af Amer 24 (L) >60 mL/min    Comment: (NOTE) The eGFR has been calculated using the CKD EPI equation. This calculation has not been validated in all clinical situations. eGFR's persistently <60 mL/min signify possible Chronic Kidney Disease.    Anion gap 14 5 - 15    Comment: Performed at [facility], [address], [place], [place] [address]  CBC     Status: Abnormal   Collection Time: 12/24/17  5:55 AM  Result Value Ref Range   WBC 9.8 4.0 - 10.5 K/uL   RBC 2.88 (L) 3.87 - 5.11 MIL/uL   Hemoglobin 8.6 (L) 12.0 - 15.0 g/dL   HCT 27.4 (L) 36.0 - 46.0 %   MCV 95.1 78.0 - 100.0 fL   MCH 29.9 26.0 - 34.0 pg   MCHC 31.4 30.0 - 36.0 g/dL   RDW 16.9 (H) 11.5 - 15.5 %   Platelets 177 150 - 400 K/uL    Comment: Performed at [facility], [address], [place], [place] [address]  Magnesium     Status: Abnormal   Collection Time: 12/24/17  5:55 AM  Result Value Ref Range   Magnesium 1.3 (L) 1.7 - 2.4 mg/dL    Comment: Performed at [facility], [address], [place], [place] [address]  MRSA PCR Screening     Status: Abnormal   Collection Time: 12/24/17  6:46 AM  Result Value Ref Range   MRSA by PCR POSITIVE (A) NEGATIVE    Comment:        The GeneXpert MRSA Assay (FDA approved for NASAL specimens only), is one component of a comprehensive MRSA colonization surveillance program. It is not intended to diagnose MRSA infection nor to guide or monitor treatment for MRSA infections. RESULT CALLED TO, READ BACK BY AND VERIFIED WITH: BROWN S. AT 0940A ON [id number] BY  THOMPSON S. Performed at  [facility], [address], [place], [place] [address]   Glucose, capillary     Status: Abnormal   Collection Time: 12/24/17  8:21 AM  Result Value Ref Range   Glucose-Capillary 144 (H) 65 - 99 mg/dL  Glucose, capillary     Status: Abnormal   Collection Time: 12/24/17 11:14 AM  Result Value Ref Range   Glucose-Capillary 173 (H) 65 - 99 mg/dL  Glucose, capillary     Status: None   Collection Time: 12/24/17  4:08 PM  Result Value Ref Range   Glucose-Capillary 95 65 - 99 mg/dL  Glucose, capillary     Status: Abnormal   Collection Time: 12/24/17  8:22 PM  Result Value Ref Range   Glucose-Capillary 138 (H) 65 - 99 mg/dL   Comment 1 Notify RN    Comment 2 Document in Chart   Glucose, capillary     Status: None   Collection Time: 12/25/17 12:59 AM  Result Value Ref Range   Glucose-Capillary 80 65 - 99 mg/dL   Comment 1 Notify RN    Comment 2 Document in Chart   Glucose, capillary     Status: Abnormal   Collection Time: 12/25/17  4:51 AM  Result Value Ref Range   Glucose-Capillary 113 (H) 65 - 99 mg/dL   Comment 1 Notify RN    Comment 2 Document in Chart   Basic metabolic panel     Status: Abnormal   Collection Time: 12/25/17  6:47 AM  Result Value Ref Range   Sodium 144 135 - 145 mmol/L   Potassium 3.2 (L) 3.5 - 5.1 mmol/L   Chloride 115 (H) 101 - 111 mmol/L   CO2 20 (L) 22 - 32 mmol/L   Glucose, Bld 133 (H) 65 - 99 mg/dL   BUN 17 6 - 20 mg/dL   Creatinine, Ser 0.92 0.44 - 1.00 mg/dL   Calcium 7.4 (L) 8.9 - 10.3 mg/dL   GFR calc non Af Amer 50 (L) >60 mL/min   GFR calc Af Amer 58 (L) >60 mL/min    Comment: (NOTE) The eGFR has been calculated using the CKD EPI equation. This calculation has not been validated in all clinical situations. eGFR's persistently <60 mL/min signify possible Chronic Kidney Disease.    Anion gap 9 5 - 15    Comment: Performed at [facility], [address], [place], [place] [address]  CBC with Differential/Platelet     Status: Abnormal    Collection Time: 12/25/17  6:47 AM  Result Value Ref Range   WBC 7.0 4.0 - 10.5 K/uL   RBC 2.82 (L) 3.87 - 5.11 MIL/uL   Hemoglobin 8.3 (L) 12.0 - 15.0 g/dL   HCT 27.1 (L) 36.0 - 46.0 %   MCV 96.1 78.0 - 100.0 fL   MCH 29.4 26.0 - 34.0 pg   MCHC 30.6 30.0 - 36.0 g/dL   RDW 16.6 (H) 11.5 - 15.5 %   Platelets 155 150 - 400 K/uL   Neutrophils Relative % 81 %   Neutro Abs 5.6 1.7 - 7.7 K/uL   Lymphocytes Relative 11 %   Lymphs Abs 0.8 0.7 - 4.0 K/uL   Monocytes Relative 6 %   Monocytes Absolute 0.4 0.1 - 1.0 K/uL   Eosinophils Relative 2 %   Eosinophils Absolute 0.2 0.0 - 0.7 K/uL   Basophils Relative 0 %   Basophils Absolute 0.0 0.0 - 0.1 K/uL    Comment: Performed at [facility],  [address], [place], [place] [address]  Magnesium     Status: None   Collection Time: 12/25/17  6:47 AM  Result Value Ref Range   Magnesium 2.1 1.7 - 2.4 mg/dL    Comment: Performed at [facility], [address], [place], [place] [address]  Glucose, capillary     Status: Abnormal   Collection Time: 12/25/17  7:51 AM  Result Value Ref Range   Glucose-Capillary 127 (H) 65 - 99 mg/dL    ABGS No results for input(s): PHART, PO2ART, TCO2, HCO3 in the last 72 hours.  Invalid input(s): PCO2 CULTURES Recent Results (from the past 240 hour(s))  MRSA PCR Screening     Status: Abnormal   Collection Time: 12/24/17  6:46 AM  Result Value Ref Range Status   MRSA by PCR POSITIVE (A) NEGATIVE Final    Comment:        The GeneXpert MRSA Assay (FDA approved for NASAL specimens only), is one component of a comprehensive MRSA colonization surveillance program. It is not intended to diagnose MRSA infection nor to guide or monitor treatment for MRSA infections. RESULT CALLED TO, READ BACK BY AND VERIFIED WITH: BROWN S. AT 0940A ON [id number] BY THOMPSON S. Performed at [facility], [address], [place],  [address]    Studies/Results: No results found.  Medications:  Prior to Admission:   Medications Prior to Admission  Medication Sig Dispense Refill Last Dose  . acetaminophen (TYLENOL) 500 MG tablet Take 1,000 mg by mouth 3 (three) times daily.   12/23/2017 at 1400  . benzocaine-resorcinol (VAGISIL) 5-2 % vaginal cream Place 1 Applicatorful vaginally daily as needed for itching or irritation.   Taking  . cyanocobalamin 100 MCG tablet Take 100 mcg by mouth daily.   12/23/2017 at 0900  . ferrous sulfate 325 (65 FE) MG tablet Take 325 mg by mouth daily with breakfast.   12/23/2017 at 0900  . furosemide (LASIX) 40 MG tablet Take 40 mg by mouth daily.    12/23/2017 at 0900  . guaiFENesin-dextromethorphan (ROBITUSSIN DM) 100-10 MG/5ML syrup Take 10 mLs by mouth every 6 (six) hours as needed for cough.   Taking  . levofloxacin (LEVAQUIN) 500 MG tablet Take 500 mg by mouth daily.   12/23/2017 at 0900  . levothyroxine (SYNTHROID, LEVOTHROID) 25 MCG tablet Take 25 mcg by mouth daily before breakfast.    12/23/2017 at 0600  . loperamide (IMODIUM A-D) 2 MG tablet Take 2 mg by mouth every 2 (two) hours as needed for diarrhea or loose stools.     . metFORMIN (GLUCOPHAGE-XR) 500 MG 24 hr tablet Take 500 mg by mouth daily with breakfast.    12/23/2017 at 0900  . Multiple Vitamin (MULTIVITAMIN) tablet Take 1 tablet by mouth daily.   12/23/2017 at 0900  . ondansetron (ZOFRAN) 4 MG tablet Take 4 mg by mouth 4 (four) times daily as needed for nausea or vomiting.     [name] Kitchen oseltamivir (TAMIFLU) 75 MG capsule Take 75 mg by mouth daily.   12/23/2017 at 0900  . pantoprazole (PROTONIX) 40 MG tablet Take 40 mg by mouth daily.   12/23/2017 at 0900  . sertraline (ZOLOFT) 100 MG tablet Take 1 tablet by mouth daily.   12/23/2017 at 0900  . sulfamethoxazole-trimethoprim (BACTRIM DS,SEPTRA DS) 800-160 MG tablet Take 1 tablet by mouth 2 (two) times daily.   12/23/2017 at 1800  . traMADol (ULTRAM) 50 MG tablet Take 50 mg by mouth every 6 (six) hours as needed for moderate  pain. Pain not relieved by Tylenol.   Taking    Scheduled: . feeding supplement (ENSURE ENLIVE)  237 mL Oral TID BM  . ferrous sulfate  325 mg Oral Q breakfast  . insulin aspart  0-9 Units Subcutaneous Q4H  . levothyroxine  25 mcg Oral QAC breakfast  . multivitamin with minerals  1 tablet Oral Daily  . pantoprazole  40 mg Oral Daily  . sertraline  100 mg Oral Daily  . sodium chloride flush  3 mL Intravenous Q12H  . cyanocobalamin  100 mcg Oral Daily   Continuous: . sodium chloride 75 mL/hr at 12/25/17 0220   WYS:[id number] **OR** acetaminophen, bisacodyl, guaiFENesin-dextromethorphan, ondansetron **OR** ondansetron (ZOFRAN) IV, senna-docusate, traMADol  Assesment: She was admitted with acute kidney injury from dehydration.  She is better.  I think if she is able to eat today she could go back to the assisted living facility with hospice backup Principal Problem:   AKI (acute kidney injury) ([place]) Active Problems:   Diabetes mellitus type 2 in nonobese ([place])   Hypothyroidism   Iron deficiency anemia   AVM (arteriovenous malformation) of stomach, acquired with hemorrhage   Dehydration   Goals of care, counseling/discussion   Palliative care by specialist   Encounter for hospice care discussion   Protein-calorie malnutrition, severe    Plan: As above    LOS: 1 day   Makael Stein L 12/25/2017, 8:26 AM

## 2017-12-25 NOTE — Care Management Important Message (Signed)
Important Message  Patient Details  Name: SHELLA LAHMAN MRN: [id number] Date of Birth: [birth year]   Medicare Important Message Given:  Yes    Sherald Barge, RN 12/25/2017, 9:34 AM

## 2017-12-25 NOTE — Discharge Summary (Signed)
Physician Discharge Summary  Patient ID: Kara Santos MRN: [id number] DOB/AGE: 82-14-1918 82 y.o. Primary Care Physician:Laiklyn Pilkenton, Percell Miller, MD Admit date: 12/23/2017 Discharge date: 12/25/2017    Discharge Diagnoses:   Principal Problem:   AKI (acute kidney injury) [facility]) Active Problems:   Diabetes mellitus type 2 in nonobese ([place])   Hypothyroidism   Iron deficiency anemia   AVM (arteriovenous malformation) of stomach, acquired with hemorrhage   Dehydration   Goals of care, counseling/discussion   Palliative care by specialist   Encounter for hospice care discussion   Protein-calorie malnutrition, severe Dementia  Allergies as of 12/25/2017      Reactions   Asa [aspirin]    Acid reflux      Medication List    STOP taking these medications   furosemide 40 MG tablet Commonly known as:  LASIX   levofloxacin 500 MG tablet Commonly known as:  LEVAQUIN     TAKE these medications   acetaminophen 500 MG tablet Commonly known as:  TYLENOL Take 1,000 mg by mouth 3 (three) times daily.   benzocaine-resorcinol 5-2 % vaginal cream Commonly known as:  VAGISIL Place 1 Applicatorful vaginally daily as needed for itching or irritation.   cyanocobalamin 100 MCG tablet Take 100 mcg by mouth daily.   ferrous sulfate 325 (65 FE) MG tablet Take 325 mg by mouth daily with breakfast.   guaiFENesin-dextromethorphan 100-10 MG/5ML syrup Commonly known as:  ROBITUSSIN DM Take 10 mLs by mouth every 6 (six) hours as needed for cough.   levothyroxine 25 MCG tablet Commonly known as:  SYNTHROID, LEVOTHROID Take 25 mcg by mouth daily before breakfast.   loperamide 2 MG tablet Commonly known as:  IMODIUM A-D Take 2 mg by mouth every 2 (two) hours as needed for diarrhea or loose stools.   metFORMIN 500 MG 24 hr tablet Commonly known as:  GLUCOPHAGE-XR Take 500 mg by mouth daily with breakfast.   multivitamin tablet Take 1 tablet by mouth daily.   ondansetron 4 MG tablet Commonly  known as:  ZOFRAN Take 4 mg by mouth 4 (four) times daily as needed for nausea or vomiting.   oseltamivir 75 MG capsule Commonly known as:  TAMIFLU Take 75 mg by mouth daily.   pantoprazole 40 MG tablet Commonly known as:  PROTONIX Take 40 mg by mouth daily.   sertraline 100 MG tablet Commonly known as:  ZOLOFT Take 1 tablet by mouth daily.   sulfamethoxazole-trimethoprim 800-160 MG tablet Commonly known as:  BACTRIM DS,SEPTRA DS Take 1 tablet by mouth 2 (two) times daily.   traMADol 50 MG tablet Commonly known as:  ULTRAM Take 50 mg by mouth every 6 (six) hours as needed for moderate pain. Pain not relieved by Tylenol.       Discharged Condition: Improved    Consults: Palliative care  Significant Diagnostic Studies: No results found.  Lab Results: Basic Metabolic Panel: Recent Labs    12/24/17 0555 12/25/17 0647  NA 140 144  K 3.3* 3.2*  CL 105 115*  CO2 21* 20*  GLUCOSE 154* 133*  BUN 29* 17  CREATININE 1.88* 0.92  CALCIUM 7.0* 7.4*  MG 1.3* 2.1   Liver Function Tests: No results for input(s): AST, ALT, ALKPHOS, BILITOT, PROT, ALBUMIN in the last 72 hours.   CBC: Recent Labs    12/23/17 2204 12/24/17 0555 12/25/17 0647  WBC 10.2 9.8 7.0  NEUTROABS 8.4*  --  5.6  HGB 9.6* 8.6* 8.3*  HCT 30.5* 27.4* 27.1*  MCV 94.4  95.1 96.1  PLT 222 177 155    Recent Results (from the past 240 hour(s))  MRSA PCR Screening     Status: Abnormal   Collection Time: 12/24/17  6:46 AM  Result Value Ref Range Status   MRSA by PCR POSITIVE (A) NEGATIVE Final    Comment:        The GeneXpert MRSA Assay (FDA approved for NASAL specimens only), is one component of a comprehensive MRSA colonization surveillance program. It is not intended to diagnose MRSA infection nor to guide or monitor treatment for MRSA infections. RESULT CALLED TO, READ BACK BY AND VERIFIED WITH: BROWN S. AT 0940A ON [id number] BY THOMPSON S. Performed at [facility], [address],  [place], [place] [address]      Hospital Course: This is a 84 year old who came to the hospital from an assisted living facility because she was not eating or drinking.  There has been an outbreak of influenza at the assisted living facility and she had been started on medication for prophylaxis.  There is also been other viral illnesses at her assisted living facility.  She was found to be dehydrated with acute kidney injury.  She had fluid replacement and improved.  At baseline she is very confused with dementia and that was worse on admission but she is back at baseline now.  She is able to eat and drink now did not have any significant swallowing issues except related to her mental status when she had speech evaluation and is ready for transfer back to her assisted living facility.  She had palliative medicine consultation and it is felt that she is appropriate for hospice care.  Discharge Exam: Blood pressure (!) 102/59, pulse 73, temperature 98.4 F (36.9 C), temperature source Oral, resp. rate 16, height 5' (1.524 m), weight 39.7 kg (87 lb 8.4 oz), SpO2 95 %. She is awake and alert.  She is confused.  Her chest is clear.  Disposition: Back to assisted living facility with hospice care      Signed: Shawnae Leiva L   12/25/2017, 9:07 AM

## 2017-12-26 LAB — GLUCOSE, CAPILLARY
GLUCOSE-CAPILLARY: 90 mg/dL (ref 65–99)
GLUCOSE-CAPILLARY: 179 mg/dL — AB (ref 65–99)
Glucose-Capillary: 128 mg/dL — ABNORMAL HIGH (ref 65–99)

## 2017-12-26 NOTE — Progress Notes (Signed)
Pt accidentally pulled out IV. MD was made aware and ordered ok to leave IV out. Will continue to monitor patient.

## 2017-12-26 NOTE — Progress Notes (Signed)
Discharge summary and FL2 faxed to [facility].  Spoke to [place] and reported having "all the paperwork needed and ok for patient to discharge."  Patient's daughter at bedside to transport patient back to [place].

## 2017-12-26 NOTE — Progress Notes (Signed)
It was too late yesterday for her to go back to her assisted living facility.  She looks good this morning back to her baseline and she can be discharged today

## 2017-12-26 NOTE — Progress Notes (Addendum)
CSW spoke with RN Stanton Kidney.  RN Stanton Kidney spoke with facility [name]).  Facility has received all paperwork needed per RN Stanton Kidney  Daughter transported patient to [facility].  Reed Breech LCSWA [phone]

## 2017-12-27 NOTE — Progress Notes (Signed)
Patient's daughter at bedside.  Packet given.  Patient stable at time of discharge.

## 2018-01-04 ENCOUNTER — Other Ambulatory Visit (HOSPITAL_COMMUNITY): Payer: Self-pay | Admitting: Internal Medicine

## 2018-01-04 NOTE — Progress Notes (Signed)
Diagnosis Other iron deficiency anemia - Plan: CBC with Differential/Platelet, Comprehensive metabolic panel, Ferritin, Lactate dehydrogenase  Staging Cancer Staging No matching staging information was found for the patient.  Assessment and Plan:  1.  IDA.  84 y.o. Santos returns for followup of Iron deficiency anemia secondary to chronic GI blood loss, secondary to AVM and chronic blood loss requiring IV iron replacement every 3 months.  Labs done 12/10/2017 show a hemoglobin of 8.9, ferritin of 21, creatinine 0.75.  She will receive Ferraheme today.  She will have repeat labs in 6-8 weeks.  She will be seen for follow-up in 4-6 months.    2.  Hypothyroidism.  Pt is on Synthroid.  Follow-up with PCP.    3.  AVMs.  Likely cause of IDA.  Follow-up with GI as recommended.    4.  DM.  Continue to follow-up with PCP.     Interval History:  84 y.o. Santos followed by Dr. Talbert Cage for iron deficiency anemia secondary to chronic GI blood loss, secondary to AVM and chronic blood loss requiring IV iron replacement every 3 months.     Current Status:  Pt  is here today for feraheme treatment and for follow up. She states her only problems are hearing loss.  She denies any blood in her stool or dark stools.    Problem List Patient Active Problem List   Diagnosis Date Noted  . Protein-calorie malnutrition, severe [E43] 12/25/2017  . Dehydration [E86.0]   . Goals of care, counseling/discussion [Z71.89]   . Palliative care by specialist [Z51.5]   . Encounter for hospice care discussion [Z71.89]   . AKI (acute kidney injury) ([place]) [N17.9] 12/23/2017  . GI bleed [K92.2] 08/07/2015  . Mandibular fracture, closed ([place]) [S02.609A] 08/07/2015  . Acute encephalopathy [G93.40] 08/07/2015  . Hematochezia [K92.1] 08/07/2015  . Fall [W19.XXXA] 11/20/2014  . Multiple rib fractures [S22.49XA] 11/17/2014  . AVM (arteriovenous malformation) of stomach, acquired with hemorrhage [K31.811] 12/04/2013  . Closed  fracture of greater trochanter of femur ([place]) [S72.113A] 02/06/2013  . History of fall [Z91.81] 02/06/2013  . Iron deficiency anemia [D50.9] 05/05/2012  . Chronic gastrointestinal bleeding [K92.2] 03/04/2012  . Diabetes mellitus type 2 in nonobese ([place]) [E11.9] 03/04/2012  . Hypothyroidism [E03.9] 03/04/2012  . History of thyroid cancer [Z85.850] 03/04/2012    Past Medical History Past Medical History:  Diagnosis Date  . Alzheimer's disease, early onset 58  . Anemia 11/07/2011   chronic IDA requiring multiple transfusions  . AVM (arteriovenous malformation) of stomach, acquired with hemorrhage 12/04/2013  . Closed fracture of greater trochanter of femur ([place]) 02/2013   left  . Depression   . Diabetes mellitus   . Fall at home 11/17/14   multiple rib fractures  . GERD (gastroesophageal reflux disease)   . Hypothyroidism    s/p thyroid surgery for thyroid cancer  . Iron deficiency anemia 05/05/2012    Past Surgical History Past Surgical History:  Procedure Laterality Date  . APPENDECTOMY    . bilateral cataract extractions    . BREAST REDUCTION SURGERY    . CHOLECYSTECTOMY    . COLONOSCOPY  08/2006   Dr. Cecelia Byars sided diverticulosis  . double balloon enteroscopy  06/1008   [facility]-->APC of 12 SB AVMs in jejunum. 6 feet of jejunum inspected  . ESOPHAGOGASTRODUODENOSCOPY  08/2006   Dr. Trevor Iha [facility], large duodenal diverticulum  . Giv[place] small bowel capsule  08/2006   few gastric erosions, numerous small bowel AVMs with SB erosions  . right shoulder  surgery    . THYROID SURGERY     thyroid cancer    Family History Family History  Problem Relation Age of Onset  . Cancer Mother   . Cancer Father   . Diabetes Father   . Diabetes Sister   . Cancer Brother      Social History  reports that she has never smoked. She has never used smokeless tobacco. She reports that she does not drink alcohol or use drugs.  Medications  Current Outpatient Medications:  .   acetaminophen (TYLENOL) 500 MG tablet, Take 1,000 mg by mouth 3 (three) times daily., Disp: , Rfl:  .  benzocaine-resorcinol (VAGISIL) 5-2 % vaginal cream, Place 1 Applicatorful vaginally daily as needed for itching or irritation., Disp: , Rfl:  .  cyanocobalamin 100 MCG tablet, Take 100 mcg by mouth daily., Disp: , Rfl:  .  ferrous sulfate 325 (65 FE) MG tablet, Take 325 mg by mouth daily with breakfast., Disp: , Rfl:  .  guaiFENesin-dextromethorphan (ROBITUSSIN DM) 100-10 MG/5ML syrup, Take 10 mLs by mouth every 6 (six) hours as needed for cough., Disp: , Rfl:  .  levothyroxine (SYNTHROID, LEVOTHROID) 25 MCG tablet, Take 25 mcg by mouth daily before breakfast. , Disp: , Rfl:  .  metFORMIN (GLUCOPHAGE-XR) 500 MG 24 hr tablet, Take 500 mg by mouth daily with breakfast. , Disp: , Rfl:  .  Multiple Vitamin (MULTIVITAMIN) tablet, Take 1 tablet by mouth daily., Disp: , Rfl:  .  pantoprazole (PROTONIX) 40 MG tablet, Take 40 mg by mouth daily., Disp: , Rfl:  .  sertraline (ZOLOFT) 100 MG tablet, Take 1 tablet by mouth daily., Disp: , Rfl:  .  traMADol (ULTRAM) 50 MG tablet, Take 50 mg by mouth every 6 (six) hours as needed for moderate pain. Pain not relieved by Tylenol., Disp: , Rfl:  .  loperamide (IMODIUM A-D) 2 MG tablet, Take 2 mg by mouth every 2 (two) hours as needed for diarrhea or loose stools., Disp: , Rfl:  .  ondansetron (ZOFRAN) 4 MG tablet, Take 4 mg by mouth 4 (four) times daily as needed for nausea or vomiting., Disp: , Rfl:  .  oseltamivir (TAMIFLU) 75 MG capsule, Take 75 mg by mouth daily., Disp: , Rfl:  .  sulfamethoxazole-trimethoprim (BACTRIM DS,SEPTRA DS) 800-160 MG tablet, Take 1 tablet by mouth 2 (two) times daily., Disp: , Rfl:   Allergies Asa [aspirin]  Review of Systems Review of Systems - Oncology ROS as per HPI otherwise 12 point ROS is negative.   Physical Exam  Vitals Wt Readings from Last 3 Encounters:  12/24/17 87 lb 8.4 oz (39.7 kg)  09/08/17 95 lb 14.4 oz  (43.5 kg)  06/09/17 94 lb (42.6 kg)   Temp Readings from Last 3 Encounters:  12/26/17 98.4 F (36.9 C) (Oral)  12/10/17 97.6 F (36.4 C) (Oral)  09/08/17 97.7 F (36.5 C) (Oral)   BP Readings from Last 3 Encounters:  12/26/17 124/67  12/10/17 (!) 114/54  09/08/17 (!) 104/56   Pulse Readings from Last 3 Encounters:  12/26/17 74  12/10/17 82  09/08/17 89   Constitutional: Well-developed, well-nourished, and in no distress.   HENT: Head: Normocephalic and atraumatic.  Mouth/Throat: No oropharyngeal exudate. Mucosa moist. Eyes: Pupils are equal, round, and reactive to light. Conjunctivae are normal. No scleral icterus.  Neck: Normal range of motion. Neck supple. No JVD present.  Cardiovascular: Normal rate, regular rhythm and normal heart sounds.  Exam reveals no gallop and no friction  rub.   No murmur heard. Pulmonary/Chest: Effort normal and breath sounds normal. No respiratory distress. No wheezes.No rales.  Abdominal: Soft. Bowel sounds are normal. No distension. There is no tenderness. There is no guarding.  Musculoskeletal: No edema or tenderness.  Lymphadenopathy: No cervical, axillary  or supraclavicular adenopathy.  Neurological: Alert and oriented to person, place, and time. No cranial nerve deficit.  Skin: Skin is warm and dry. No rash noted. No erythema. No pallor.  Psychiatric: Affect and judgment normal.   Labs Lab on 12/10/2017  Component Date Value Ref Range Status  . WBC 12/10/2017 12.3* 4.0 - 10.5 K/uL Final  . RBC 12/10/2017 3.11* 3.87 - 5.11 MIL/uL Final  . Hemoglobin 12/10/2017 8.9* 12.0 - 15.0 g/dL Final  . HCT 12/10/2017 29.5* 36.0 - 46.0 % Final  . MCV 12/10/2017 94.9  78.0 - 100.0 fL Final  . MCH 12/10/2017 28.6  26.0 - 34.0 pg Final  . MCHC 12/10/2017 30.2  30.0 - 36.0 g/dL Final  . RDW 12/10/2017 15.5  11.5 - 15.5 % Final  . Platelets 12/10/2017 250  150 - 400 K/uL Final  . Neutrophils Relative % 12/10/2017 76  % Final  . Neutro Abs 12/10/2017  9.4* 1.7 - 7.7 K/uL Final  . Lymphocytes Relative 12/10/2017 13  % Final  . Lymphs Abs 12/10/2017 1.6  0.7 - 4.0 K/uL Final  . Monocytes Relative 12/10/2017 9  % Final  . Monocytes Absolute 12/10/2017 1.1* 0.1 - 1.0 K/uL Final  . Eosinophils Relative 12/10/2017 2  % Final  . Eosinophils Absolute 12/10/2017 0.2  0.0 - 0.7 K/uL Final  . Basophils Relative 12/10/2017 0  % Final  . Basophils Absolute 12/10/2017 0.1  0.0 - 0.1 K/uL Final   Performed at [facility], [address], [place], [place] [address]  . Sodium 12/10/2017 141  135 - 145 mmol/L Final  . Potassium 12/10/2017 3.4* 3.5 - 5.1 mmol/L Final  . Chloride 12/10/2017 99* 101 - 111 mmol/L Final  . CO2 12/10/2017 26  22 - 32 mmol/L Final  . Glucose, Bld 12/10/2017 162* 65 - 99 mg/dL Final  . BUN 12/10/2017 21* 6 - 20 mg/dL Final  . Creatinine, Ser 12/10/2017 0.75  0.44 - 1.00 mg/dL Final  . Calcium 12/10/2017 8.5* 8.9 - 10.3 mg/dL Final  . Total Protein 12/10/2017 7.2  6.5 - 8.1 g/dL Final  . Albumin 12/10/2017 4.0  3.5 - 5.0 g/dL Final  . AST 12/10/2017 34  15 - 41 U/L Final  . ALT 12/10/2017 21  14 - 54 U/L Final  . Alkaline Phosphatase 12/10/2017 86  38 - 126 U/L Final  . Total Bilirubin 12/10/2017 0.6  0.3 - 1.2 mg/dL Final  . GFR calc non Af Amer 12/10/2017 >60  >60 mL/min Final  . GFR calc Af Amer 12/10/2017 >60  >60 mL/min Final   Comment: (NOTE) The eGFR has been calculated using the CKD EPI equation. This calculation has not been validated in all clinical situations. eGFR's persistently <60 mL/min signify possible Chronic Kidney Disease.   Georgiann Hahn gap 12/10/2017 16* 5 - 15 Final   Performed at [facility], [address] [place], [place] [address]  . Iron 12/10/2017 260* 28 - 170 ug/dL Final  . TIBC 12/10/2017 307  250 - 450 ug/dL Final  . Saturation Ratios 12/10/2017 85* 10.4 - 31.8 % Final  . UIBC 12/10/2017 47  ug/dL Final   Performed at [place],  [place] [address]  .  Ferritin 12/10/2017 21  11 - 307 ng/mL Final   Performed at [facility], [place] [address] [place], [place] [address]     Pathology Orders Placed This Encounter  Procedures  . CBC with Differential/Platelet    Standing Status:   Future    Standing Expiration Date:   12/11/2018  . Comprehensive metabolic panel    Standing Status:   Future    Standing Expiration Date:   12/11/2018  . Ferritin    Standing Status:   Future    Standing Expiration Date:   12/11/2018  . Lactate dehydrogenase    Standing Status:   Future    Standing Expiration Date:   12/11/2018       Zoila Shutter MD

## 2018-02-11 DIAGNOSIS — M79675 Pain in left toe(s): Secondary | ICD-10-CM | POA: Diagnosis not present

## 2018-02-11 DIAGNOSIS — B351 Tinea unguium: Secondary | ICD-10-CM | POA: Diagnosis not present

## 2018-02-11 DIAGNOSIS — M79674 Pain in right toe(s): Secondary | ICD-10-CM | POA: Diagnosis not present

## 2018-02-19 DIAGNOSIS — Z9181 History of falling: Secondary | ICD-10-CM | POA: Diagnosis not present

## 2018-02-19 DIAGNOSIS — I10 Essential (primary) hypertension: Secondary | ICD-10-CM | POA: Diagnosis not present

## 2018-02-19 DIAGNOSIS — R600 Localized edema: Secondary | ICD-10-CM | POA: Diagnosis not present

## 2018-02-19 DIAGNOSIS — D500 Iron deficiency anemia secondary to blood loss (chronic): Secondary | ICD-10-CM | POA: Diagnosis not present

## 2018-02-19 DIAGNOSIS — Z85850 Personal history of malignant neoplasm of thyroid: Secondary | ICD-10-CM | POA: Diagnosis not present

## 2018-02-19 DIAGNOSIS — E119 Type 2 diabetes mellitus without complications: Secondary | ICD-10-CM | POA: Diagnosis not present

## 2018-02-19 DIAGNOSIS — Z9089 Acquired absence of other organs: Secondary | ICD-10-CM | POA: Diagnosis not present

## 2018-02-19 DIAGNOSIS — M1990 Unspecified osteoarthritis, unspecified site: Secondary | ICD-10-CM | POA: Diagnosis not present

## 2018-02-19 DIAGNOSIS — Z7984 Long term (current) use of oral hypoglycemic drugs: Secondary | ICD-10-CM | POA: Diagnosis not present

## 2018-02-19 DIAGNOSIS — F0390 Unspecified dementia without behavioral disturbance: Secondary | ICD-10-CM | POA: Diagnosis not present

## 2018-02-19 DIAGNOSIS — K210 Gastro-esophageal reflux disease with esophagitis: Secondary | ICD-10-CM | POA: Diagnosis not present

## 2018-02-23 DIAGNOSIS — F0390 Unspecified dementia without behavioral disturbance: Secondary | ICD-10-CM | POA: Diagnosis not present

## 2018-02-23 DIAGNOSIS — E119 Type 2 diabetes mellitus without complications: Secondary | ICD-10-CM | POA: Diagnosis not present

## 2018-02-23 DIAGNOSIS — R600 Localized edema: Secondary | ICD-10-CM | POA: Diagnosis not present

## 2018-02-23 DIAGNOSIS — I10 Essential (primary) hypertension: Secondary | ICD-10-CM | POA: Diagnosis not present

## 2018-02-23 DIAGNOSIS — D500 Iron deficiency anemia secondary to blood loss (chronic): Secondary | ICD-10-CM | POA: Diagnosis not present

## 2018-02-23 DIAGNOSIS — K210 Gastro-esophageal reflux disease with esophagitis: Secondary | ICD-10-CM | POA: Diagnosis not present

## 2018-02-25 DIAGNOSIS — D500 Iron deficiency anemia secondary to blood loss (chronic): Secondary | ICD-10-CM | POA: Diagnosis not present

## 2018-02-25 DIAGNOSIS — R600 Localized edema: Secondary | ICD-10-CM | POA: Diagnosis not present

## 2018-02-25 DIAGNOSIS — K210 Gastro-esophageal reflux disease with esophagitis: Secondary | ICD-10-CM | POA: Diagnosis not present

## 2018-02-25 DIAGNOSIS — F0390 Unspecified dementia without behavioral disturbance: Secondary | ICD-10-CM | POA: Diagnosis not present

## 2018-02-25 DIAGNOSIS — E119 Type 2 diabetes mellitus without complications: Secondary | ICD-10-CM | POA: Diagnosis not present

## 2018-02-25 DIAGNOSIS — I10 Essential (primary) hypertension: Secondary | ICD-10-CM | POA: Diagnosis not present

## 2018-03-02 DIAGNOSIS — F0390 Unspecified dementia without behavioral disturbance: Secondary | ICD-10-CM | POA: Diagnosis not present

## 2018-03-02 DIAGNOSIS — I10 Essential (primary) hypertension: Secondary | ICD-10-CM | POA: Diagnosis not present

## 2018-03-02 DIAGNOSIS — K210 Gastro-esophageal reflux disease with esophagitis: Secondary | ICD-10-CM | POA: Diagnosis not present

## 2018-03-02 DIAGNOSIS — D500 Iron deficiency anemia secondary to blood loss (chronic): Secondary | ICD-10-CM | POA: Diagnosis not present

## 2018-03-02 DIAGNOSIS — E119 Type 2 diabetes mellitus without complications: Secondary | ICD-10-CM | POA: Diagnosis not present

## 2018-03-02 DIAGNOSIS — R600 Localized edema: Secondary | ICD-10-CM | POA: Diagnosis not present

## 2018-03-04 DIAGNOSIS — D500 Iron deficiency anemia secondary to blood loss (chronic): Secondary | ICD-10-CM | POA: Diagnosis not present

## 2018-03-04 DIAGNOSIS — F0390 Unspecified dementia without behavioral disturbance: Secondary | ICD-10-CM | POA: Diagnosis not present

## 2018-03-04 DIAGNOSIS — K210 Gastro-esophageal reflux disease with esophagitis: Secondary | ICD-10-CM | POA: Diagnosis not present

## 2018-03-04 DIAGNOSIS — R600 Localized edema: Secondary | ICD-10-CM | POA: Diagnosis not present

## 2018-03-04 DIAGNOSIS — I10 Essential (primary) hypertension: Secondary | ICD-10-CM | POA: Diagnosis not present

## 2018-03-04 DIAGNOSIS — E119 Type 2 diabetes mellitus without complications: Secondary | ICD-10-CM | POA: Diagnosis not present

## 2018-03-09 DIAGNOSIS — R600 Localized edema: Secondary | ICD-10-CM | POA: Diagnosis not present

## 2018-03-09 DIAGNOSIS — K210 Gastro-esophageal reflux disease with esophagitis: Secondary | ICD-10-CM | POA: Diagnosis not present

## 2018-03-09 DIAGNOSIS — F0390 Unspecified dementia without behavioral disturbance: Secondary | ICD-10-CM | POA: Diagnosis not present

## 2018-03-09 DIAGNOSIS — D500 Iron deficiency anemia secondary to blood loss (chronic): Secondary | ICD-10-CM | POA: Diagnosis not present

## 2018-03-09 DIAGNOSIS — I509 Heart failure, unspecified: Secondary | ICD-10-CM | POA: Diagnosis not present

## 2018-03-09 DIAGNOSIS — E119 Type 2 diabetes mellitus without complications: Secondary | ICD-10-CM | POA: Diagnosis not present

## 2018-03-09 DIAGNOSIS — I10 Essential (primary) hypertension: Secondary | ICD-10-CM | POA: Diagnosis not present

## 2018-03-11 ENCOUNTER — Other Ambulatory Visit (HOSPITAL_COMMUNITY): Payer: PRIVATE HEALTH INSURANCE

## 2018-03-11 ENCOUNTER — Ambulatory Visit (HOSPITAL_COMMUNITY): Payer: PRIVATE HEALTH INSURANCE | Admitting: Internal Medicine

## 2018-03-12 DIAGNOSIS — D500 Iron deficiency anemia secondary to blood loss (chronic): Secondary | ICD-10-CM | POA: Diagnosis not present

## 2018-03-12 DIAGNOSIS — I10 Essential (primary) hypertension: Secondary | ICD-10-CM | POA: Diagnosis not present

## 2018-03-12 DIAGNOSIS — K210 Gastro-esophageal reflux disease with esophagitis: Secondary | ICD-10-CM | POA: Diagnosis not present

## 2018-03-12 DIAGNOSIS — R600 Localized edema: Secondary | ICD-10-CM | POA: Diagnosis not present

## 2018-03-12 DIAGNOSIS — E119 Type 2 diabetes mellitus without complications: Secondary | ICD-10-CM | POA: Diagnosis not present

## 2018-03-12 DIAGNOSIS — F0390 Unspecified dementia without behavioral disturbance: Secondary | ICD-10-CM | POA: Diagnosis not present

## 2018-03-16 DIAGNOSIS — D500 Iron deficiency anemia secondary to blood loss (chronic): Secondary | ICD-10-CM | POA: Diagnosis not present

## 2018-03-16 DIAGNOSIS — R600 Localized edema: Secondary | ICD-10-CM | POA: Diagnosis not present

## 2018-03-16 DIAGNOSIS — E119 Type 2 diabetes mellitus without complications: Secondary | ICD-10-CM | POA: Diagnosis not present

## 2018-03-16 DIAGNOSIS — K210 Gastro-esophageal reflux disease with esophagitis: Secondary | ICD-10-CM | POA: Diagnosis not present

## 2018-03-16 DIAGNOSIS — I10 Essential (primary) hypertension: Secondary | ICD-10-CM | POA: Diagnosis not present

## 2018-03-16 DIAGNOSIS — F0390 Unspecified dementia without behavioral disturbance: Secondary | ICD-10-CM | POA: Diagnosis not present

## 2018-03-18 ENCOUNTER — Inpatient Hospital Stay (HOSPITAL_COMMUNITY): Payer: Medicare Other

## 2018-03-18 ENCOUNTER — Inpatient Hospital Stay (HOSPITAL_COMMUNITY): Payer: Medicare Other | Attending: Internal Medicine

## 2018-03-18 ENCOUNTER — Inpatient Hospital Stay (HOSPITAL_BASED_OUTPATIENT_CLINIC_OR_DEPARTMENT_OTHER): Payer: Medicare Other | Admitting: Internal Medicine

## 2018-03-18 ENCOUNTER — Other Ambulatory Visit: Payer: Self-pay

## 2018-03-18 ENCOUNTER — Encounter (HOSPITAL_COMMUNITY): Payer: Self-pay | Admitting: Internal Medicine

## 2018-03-18 VITALS — BP 109/78 | HR 85 | Temp 98.4°F | Resp 16 | Wt 90.4 lb

## 2018-03-18 VITALS — BP 121/57 | HR 84 | Temp 97.6°F | Resp 18

## 2018-03-18 DIAGNOSIS — Q2733 Arteriovenous malformation of digestive system vessel: Secondary | ICD-10-CM

## 2018-03-18 DIAGNOSIS — D508 Other iron deficiency anemias: Secondary | ICD-10-CM

## 2018-03-18 DIAGNOSIS — E119 Type 2 diabetes mellitus without complications: Secondary | ICD-10-CM

## 2018-03-18 DIAGNOSIS — D509 Iron deficiency anemia, unspecified: Secondary | ICD-10-CM

## 2018-03-18 DIAGNOSIS — R238 Other skin changes: Secondary | ICD-10-CM

## 2018-03-18 DIAGNOSIS — E039 Hypothyroidism, unspecified: Secondary | ICD-10-CM | POA: Diagnosis not present

## 2018-03-18 DIAGNOSIS — D500 Iron deficiency anemia secondary to blood loss (chronic): Secondary | ICD-10-CM | POA: Diagnosis not present

## 2018-03-18 DIAGNOSIS — K922 Gastrointestinal hemorrhage, unspecified: Secondary | ICD-10-CM

## 2018-03-18 DIAGNOSIS — K31811 Angiodysplasia of stomach and duodenum with bleeding: Secondary | ICD-10-CM

## 2018-03-18 LAB — CBC WITH DIFFERENTIAL/PLATELET
BASOS ABS: 0 10*3/uL (ref 0.0–0.1)
BASOS PCT: 0 %
Eosinophils Absolute: 0.1 10*3/uL (ref 0.0–0.7)
Eosinophils Relative: 1 %
HEMATOCRIT: 32.3 % — AB (ref 36.0–46.0)
HEMOGLOBIN: 9.6 g/dL — AB (ref 12.0–15.0)
Lymphocytes Relative: 11 %
Lymphs Abs: 1 10*3/uL (ref 0.7–4.0)
MCH: 27 pg (ref 26.0–34.0)
MCHC: 29.7 g/dL — ABNORMAL LOW (ref 30.0–36.0)
MCV: 90.7 fL (ref 78.0–100.0)
Monocytes Absolute: 0.6 10*3/uL (ref 0.1–1.0)
Monocytes Relative: 7 %
NEUTROS ABS: 7 10*3/uL (ref 1.7–7.7)
NEUTROS PCT: 81 %
Platelets: 207 10*3/uL (ref 150–400)
RBC: 3.56 MIL/uL — AB (ref 3.87–5.11)
RDW: 15.1 % (ref 11.5–15.5)
WBC: 8.6 10*3/uL (ref 4.0–10.5)

## 2018-03-18 LAB — COMPREHENSIVE METABOLIC PANEL
ALBUMIN: 4 g/dL (ref 3.5–5.0)
ALT: 25 U/L (ref 14–54)
AST: 35 U/L (ref 15–41)
Alkaline Phosphatase: 70 U/L (ref 38–126)
Anion gap: 16 — ABNORMAL HIGH (ref 5–15)
BILIRUBIN TOTAL: 0.5 mg/dL (ref 0.3–1.2)
BUN: 31 mg/dL — AB (ref 6–20)
CO2: 27 mmol/L (ref 22–32)
Calcium: 8.7 mg/dL — ABNORMAL LOW (ref 8.9–10.3)
Chloride: 97 mmol/L — ABNORMAL LOW (ref 101–111)
Creatinine, Ser: 1.09 mg/dL — ABNORMAL HIGH (ref 0.44–1.00)
GFR calc Af Amer: 47 mL/min — ABNORMAL LOW (ref >60)
GFR calc non Af Amer: 41 mL/min — ABNORMAL LOW (ref >60)
GLUCOSE: 176 mg/dL — AB (ref 65–99)
POTASSIUM: 4.2 mmol/L (ref 3.5–5.1)
Sodium: 140 mmol/L (ref 135–145)
TOTAL PROTEIN: 6.7 g/dL (ref 6.5–8.1)

## 2018-03-18 LAB — FERRITIN: Ferritin: 19 ng/mL (ref 11–307)

## 2018-03-18 LAB — LACTATE DEHYDROGENASE: LDH: 216 U/L — ABNORMAL HIGH (ref 98–192)

## 2018-03-18 MED ORDER — SODIUM CHLORIDE 0.9 % IV SOLN
Freq: Once | INTRAVENOUS | Status: AC
  Administered 2018-03-18: 14:00:00 via INTRAVENOUS

## 2018-03-18 MED ORDER — SODIUM CHLORIDE 0.9 % IV SOLN
510.0000 mg | Freq: Once | INTRAVENOUS | Status: AC
  Administered 2018-03-18: 510 mg via INTRAVENOUS
  Filled 2018-03-18: qty 17

## 2018-03-18 NOTE — Progress Notes (Signed)
Diagnosis Other iron deficiency anemia - Plan: CBC with Differential/Platelet, Comprehensive metabolic panel, Lactate dehydrogenase, Ferritin, CBC with Differential/Platelet, Comprehensive metabolic panel, Lactate dehydrogenase, Ferritin  Staging Cancer Staging No matching staging information was found for the patient.  Assessment and Plan:  1.  IDA.  84 y.o. female seen for follow-up due to IDA secondary to chronic GI blood loss, secondary to AVM and chronic blood loss requiring IV iron replacement every 3 months.   Labs done 03/18/2018 reviewed with pt and showed WBC 8.6 Hb 9.6 and plts 207,000.  Hb is improved from labs done in March 2019 when HB was 8.3.  Daughter is reporting pt is having worsening fatigue.  She will be treated with IV iron  today and will have repeat labs after IV iron.  She will be seen for follow-up in 09/2018 with labs.    2.  Fatigue.  Daughter is reporting worsening fatigue.  Will determine if symptoms improve after IV iron.  BP 121/63, HR 56.  Continue to have TSH monitored through PCP.    3.  Hypothyroidism.  Pt is on Synthroid.  Follow-up with PCP.    4.  AVMs.  Likely cause of IDA.  Pt denies any recent bleeds.    5.  Bruising.  Labs reviewed show plts WNL at 207,000.  She has evidence of chronic skin changes likely due to aging and prior sun exposure.    6.  DM.  Continue to follow-up with PCP.     Interval History:  84 y.o. female followed by Dr. Talbert Cage for iron deficiency anemia secondary to chronic GI blood loss, secondary to AVM and chronic blood loss requiring IV iron replacement every 3 months.    Current Status:  Pt is seen today for follow-up prior to [facility].  Daughter is reporting worsening fatigue and occasional bruising.   Problem List Patient Active Problem List   Diagnosis Date Noted  . Protein-calorie malnutrition, severe [E43] 12/25/2017  . Dehydration [E86.0]   . Goals of care, counseling/discussion [Z71.89]   . Palliative care by  specialist [Z51.5]   . Encounter for hospice care discussion [Z71.89]   . AKI (acute kidney injury) ([place]) [N17.9] 12/23/2017  . GI bleed [K92.2] 08/07/2015  . Mandibular fracture, closed ([place]) [S02.609A] 08/07/2015  . Acute encephalopathy [G93.40] 08/07/2015  . Hematochezia [K92.1] 08/07/2015  . Fall [W19.XXXA] 11/20/2014  . Multiple rib fractures [S22.49XA] 11/17/2014  . AVM (arteriovenous malformation) of stomach, acquired with hemorrhage [K31.811] 12/04/2013  . Closed fracture of greater trochanter of femur ([place]) [S72.113A] 02/06/2013  . History of fall [Z91.81] 02/06/2013  . Iron deficiency anemia [D50.9] 05/05/2012  . Chronic gastrointestinal bleeding [K92.2] 03/04/2012  . Diabetes mellitus type 2 in nonobese ([place]) [E11.9] 03/04/2012  . Hypothyroidism [E03.9] 03/04/2012  . History of thyroid cancer [Z85.850] 03/04/2012    Past Medical History Past Medical History:  Diagnosis Date  . Alzheimer's disease, early onset 5  . Anemia 11/07/2011   chronic IDA requiring multiple transfusions  . AVM (arteriovenous malformation) of stomach, acquired with hemorrhage 12/04/2013  . Closed fracture of greater trochanter of femur ([place]) 02/2013   left  . Depression   . Diabetes mellitus   . Fall at home 11/17/14   multiple rib fractures  . GERD (gastroesophageal reflux disease)   . Hypothyroidism    s/p thyroid surgery for thyroid cancer  . Iron deficiency anemia 05/05/2012    Past Surgical History Past Surgical History:  Procedure Laterality Date  . APPENDECTOMY    .  bilateral cataract extractions    . BREAST REDUCTION SURGERY    . CHOLECYSTECTOMY    . COLONOSCOPY  08/2006   Dr. Cecelia Byars sided diverticulosis  . double balloon enteroscopy  06/1008   [facility]-->APC of 12 SB AVMs in jejunum. 6 feet of jejunum inspected  . ESOPHAGOGASTRODUODENOSCOPY  08/2006   Dr. Trevor Iha [facility], large duodenal diverticulum  . Givens small bowel capsule  08/2006   few gastric erosions, numerous  small bowel AVMs with SB erosions  . right shoulder surgery    . THYROID SURGERY     thyroid cancer    Family History Family History  Problem Relation Age of Onset  . Cancer Mother   . Cancer Father   . Diabetes Father   . Diabetes Sister   . Cancer Brother      Social History  reports that she has never smoked. She has never used smokeless tobacco. She reports that she does not drink alcohol or use drugs.  Medications  Current Outpatient Medications:  .  acetaminophen (TYLENOL) 500 MG tablet, Take 1,000 mg by mouth 3 (three) times daily., Disp: , Rfl:  .  benzocaine-resorcinol (VAGISIL) 5-2 % vaginal cream, Place 1 Applicatorful vaginally daily as needed for itching or irritation., Disp: , Rfl:  .  cyanocobalamin 100 MCG tablet, Take 100 mcg by mouth daily., Disp: , Rfl:  .  ferrous sulfate 325 (65 FE) MG tablet, Take 325 mg by mouth daily with breakfast., Disp: , Rfl:  .  furosemide (LASIX) 20 MG tablet, , Disp: , Rfl:  .  guaiFENesin-dextromethorphan (ROBITUSSIN DM) 100-10 MG/5ML syrup, Take 10 mLs by mouth every 6 (six) hours as needed for cough., Disp: , Rfl:  .  levothyroxine (SYNTHROID, LEVOTHROID) 25 MCG tablet, Take 25 mcg by mouth daily before breakfast. , Disp: , Rfl:  .  loperamide (IMODIUM A-D) 2 MG tablet, Take 2 mg by mouth every 2 (two) hours as needed for diarrhea or loose stools., Disp: , Rfl:  .  metFORMIN (GLUCOPHAGE-XR) 500 MG 24 hr tablet, Take 500 mg by mouth daily with breakfast. , Disp: , Rfl:  .  Multiple Vitamin (MULTIVITAMIN) tablet, Take 1 tablet by mouth daily., Disp: , Rfl:  .  ondansetron (ZOFRAN) 4 MG tablet, Take 4 mg by mouth 4 (four) times daily as needed for nausea or vomiting., Disp: , Rfl:  .  oseltamivir (TAMIFLU) 75 MG capsule, Take 75 mg by mouth daily., Disp: , Rfl:  .  pantoprazole (PROTONIX) 40 MG tablet, Take 40 mg by mouth daily., Disp: , Rfl:  .  potassium chloride SA (K-DUR,KLOR-CON) 20 MEQ tablet, , Disp: , Rfl:  .  sertraline  (ZOLOFT) 100 MG tablet, Take 1 tablet by mouth daily., Disp: , Rfl:  .  sulfamethoxazole-trimethoprim (BACTRIM DS,SEPTRA DS) 800-160 MG tablet, Take 1 tablet by mouth 2 (two) times daily., Disp: , Rfl:  .  traMADol (ULTRAM) 50 MG tablet, Take 50 mg by mouth every 6 (six) hours as needed for moderate pain. Pain not relieved by Tylenol., Disp: , Rfl:  No current facility-administered medications for this visit.   Facility-Administered Medications Ordered in Other Visits:  .  0.9 %  sodium chloride infusion, , Intravenous, Once, Kefalas, Thomas S, PA-C .  ferumoxytol [facility]) 510 mg in sodium chloride 0.9 % 100 mL IVPB, 510 mg, Intravenous, Once, Choksi, Delorise Shiner, MD  Allergies Asa [aspirin]  Review of Systems Review of Systems - Oncology ROS as per HPI negative except for fatigue and bruising.  Physical Exam  Vitals Wt Readings from Last 3 Encounters:  03/18/18 90 lb 6.4 oz (41 kg)  12/24/17 87 lb 8.4 oz (39.7 kg)  09/08/17 95 lb 14.4 oz (43.5 kg)   Temp Readings from Last 3 Encounters:  03/18/18 97.6 F (36.4 C) (Oral)  03/18/18 98.4 F (36.9 C) (Oral)  12/26/17 98.4 F (36.9 C) (Oral)   BP Readings from Last 3 Encounters:  03/18/18 (!) 121/57  03/18/18 109/78  12/26/17 124/67   Pulse Readings from Last 3 Encounters:  03/18/18 84  03/18/18 85  12/26/17 74   Constitutional: Well-developed, well-nourished, and in no distress.   HENT: Head: Normocephalic and atraumatic.  Mouth/Throat: No oropharyngeal exudate. Mucosa moist. Eyes: Pupils are equal, round, and reactive to light. Conjunctivae are normal. No scleral icterus.  Neck: Normal range of motion. Neck supple. No JVD present.  Cardiovascular: Normal rate, regular rhythm and normal heart sounds.  Exam reveals no gallop and no friction rub.   No murmur heard. Pulmonary/Chest: Effort normal and breath sounds normal. No respiratory distress. No wheezes.No rales.  Abdominal: Soft. Bowel sounds are normal. No  distension. There is no tenderness. There is no guarding.  Musculoskeletal: No edema or tenderness.  Lymphadenopathy: No cervical, axillary or supraclavicular adenopathy.  Neurological: Alert and oriented to person, place, and time. No cranial nerve deficit.  Skin: Skin is warm.  Minor bruises noted due to chronic skin changes.   Psychiatric: Affect and judgment normal.   Labs Appointment on 03/18/2018  Component Date Value Ref Range Status  . WBC 03/18/2018 8.6  4.0 - 10.5 K/uL Final  . RBC 03/18/2018 3.56* 3.87 - 5.11 MIL/uL Final  . Hemoglobin 03/18/2018 9.6* 12.0 - 15.0 g/dL Final  . HCT 03/18/2018 32.3* 36.0 - 46.0 % Final  . MCV 03/18/2018 90.7  78.0 - 100.0 fL Final  . MCH 03/18/2018 27.0  26.0 - 34.0 pg Final  . MCHC 03/18/2018 29.7* 30.0 - 36.0 g/dL Final  . RDW 03/18/2018 15.1  11.5 - 15.5 % Final  . Platelets 03/18/2018 207  150 - 400 K/uL Final  . Neutrophils Relative % 03/18/2018 81  % Final  . Neutro Abs 03/18/2018 7.0  1.7 - 7.7 K/uL Final  . Lymphocytes Relative 03/18/2018 11  % Final  . Lymphs Abs 03/18/2018 1.0  0.7 - 4.0 K/uL Final  . Monocytes Relative 03/18/2018 7  % Final  . Monocytes Absolute 03/18/2018 0.6  0.1 - 1.0 K/uL Final  . Eosinophils Relative 03/18/2018 1  % Final  . Eosinophils Absolute 03/18/2018 0.1  0.0 - 0.7 K/uL Final  . Basophils Relative 03/18/2018 0  % Final  . Basophils Absolute 03/18/2018 0.0  0.0 - 0.1 K/uL Final   Performed at [facility], [address] [place], [place] [address]  . Sodium 03/18/2018 140  135 - 145 mmol/L Final  . Potassium 03/18/2018 4.2  3.5 - 5.1 mmol/L Final  . Chloride 03/18/2018 97* 101 - 111 mmol/L Final  . CO2 03/18/2018 27  22 - 32 mmol/L Final  . Glucose, Bld 03/18/2018 176* 65 - 99 mg/dL Final  . BUN 03/18/2018 31* 6 - 20 mg/dL Final  . Creatinine, Ser 03/18/2018 1.09* 0.44 - 1.00 mg/dL Final  . Calcium 03/18/2018 8.7* 8.9 - 10.3 mg/dL Final  . Total Protein 03/18/2018 6.7  6.5 - 8.1 g/dL Final  .  Albumin 03/18/2018 4.0  3.5 - 5.0 g/dL Final  . AST 03/18/2018 35  15 - 41 U/L Final  .  ALT 03/18/2018 25  14 - 54 U/L Final  . Alkaline Phosphatase 03/18/2018 70  38 - 126 U/L Final  . Total Bilirubin 03/18/2018 0.5  0.3 - 1.2 mg/dL Final  . GFR calc non Af Amer 03/18/2018 41* >60 mL/min Final  . GFR calc Af Amer 03/18/2018 47* >60 mL/min Final   Comment: (NOTE) The eGFR has been calculated using the CKD EPI equation. This calculation has not been validated in all clinical situations. eGFR's persistently <60 mL/min signify possible Chronic Kidney Disease.   Georgiann Hahn gap 03/18/2018 16* 5 - 15 Final   Performed at [facility], [address], [place], [place] [address]  . LDH 03/18/2018 216* 98 - 192 U/L Final   Performed at [facility], [address] [place], [place] [address]     Pathology Orders Placed This Encounter  Procedures  . CBC with Differential/Platelet    Standing Status:   Future    Standing Expiration Date:   03/19/2019  . Comprehensive metabolic panel    Standing Status:   Future    Standing Expiration Date:   03/19/2019  . Lactate dehydrogenase    Standing Status:   Future    Standing Expiration Date:   03/19/2019  . Ferritin    Standing Status:   Future    Standing Expiration Date:   03/19/2019  . CBC with Differential/Platelet    Standing Status:   Future    Standing Expiration Date:   03/19/2019  . Comprehensive metabolic panel    Standing Status:   Future    Standing Expiration Date:   03/19/2019  . Lactate dehydrogenase    Standing Status:   Future    Standing Expiration Date:   03/19/2019  . Ferritin    Standing Status:   Future    Standing Expiration Date:   03/19/2019       Zoila Shutter MD

## 2018-03-18 NOTE — Progress Notes (Signed)
Tolerated infusion w/o adverse reaction.  Alert, in no distress.  VSS.  Discharged via wheelchair in c/o daughter.   

## 2018-03-19 DIAGNOSIS — K210 Gastro-esophageal reflux disease with esophagitis: Secondary | ICD-10-CM | POA: Diagnosis not present

## 2018-03-19 DIAGNOSIS — I10 Essential (primary) hypertension: Secondary | ICD-10-CM | POA: Diagnosis not present

## 2018-03-19 DIAGNOSIS — E119 Type 2 diabetes mellitus without complications: Secondary | ICD-10-CM | POA: Diagnosis not present

## 2018-03-19 DIAGNOSIS — D500 Iron deficiency anemia secondary to blood loss (chronic): Secondary | ICD-10-CM | POA: Diagnosis not present

## 2018-03-19 DIAGNOSIS — R600 Localized edema: Secondary | ICD-10-CM | POA: Diagnosis not present

## 2018-03-19 DIAGNOSIS — F0390 Unspecified dementia without behavioral disturbance: Secondary | ICD-10-CM | POA: Diagnosis not present

## 2018-03-23 DIAGNOSIS — D500 Iron deficiency anemia secondary to blood loss (chronic): Secondary | ICD-10-CM | POA: Diagnosis not present

## 2018-03-23 DIAGNOSIS — K210 Gastro-esophageal reflux disease with esophagitis: Secondary | ICD-10-CM | POA: Diagnosis not present

## 2018-03-23 DIAGNOSIS — R600 Localized edema: Secondary | ICD-10-CM | POA: Diagnosis not present

## 2018-03-23 DIAGNOSIS — I10 Essential (primary) hypertension: Secondary | ICD-10-CM | POA: Diagnosis not present

## 2018-03-23 DIAGNOSIS — E119 Type 2 diabetes mellitus without complications: Secondary | ICD-10-CM | POA: Diagnosis not present

## 2018-03-23 DIAGNOSIS — F0390 Unspecified dementia without behavioral disturbance: Secondary | ICD-10-CM | POA: Diagnosis not present

## 2018-03-24 DIAGNOSIS — R600 Localized edema: Secondary | ICD-10-CM | POA: Diagnosis not present

## 2018-03-24 DIAGNOSIS — F0390 Unspecified dementia without behavioral disturbance: Secondary | ICD-10-CM | POA: Diagnosis not present

## 2018-03-24 DIAGNOSIS — I10 Essential (primary) hypertension: Secondary | ICD-10-CM | POA: Diagnosis not present

## 2018-03-24 DIAGNOSIS — D500 Iron deficiency anemia secondary to blood loss (chronic): Secondary | ICD-10-CM | POA: Diagnosis not present

## 2018-03-24 DIAGNOSIS — K210 Gastro-esophageal reflux disease with esophagitis: Secondary | ICD-10-CM | POA: Diagnosis not present

## 2018-03-24 DIAGNOSIS — E119 Type 2 diabetes mellitus without complications: Secondary | ICD-10-CM | POA: Diagnosis not present

## 2018-03-26 DIAGNOSIS — R600 Localized edema: Secondary | ICD-10-CM | POA: Diagnosis not present

## 2018-03-26 DIAGNOSIS — E119 Type 2 diabetes mellitus without complications: Secondary | ICD-10-CM | POA: Diagnosis not present

## 2018-03-26 DIAGNOSIS — F0390 Unspecified dementia without behavioral disturbance: Secondary | ICD-10-CM | POA: Diagnosis not present

## 2018-03-26 DIAGNOSIS — D500 Iron deficiency anemia secondary to blood loss (chronic): Secondary | ICD-10-CM | POA: Diagnosis not present

## 2018-03-26 DIAGNOSIS — I10 Essential (primary) hypertension: Secondary | ICD-10-CM | POA: Diagnosis not present

## 2018-03-26 DIAGNOSIS — K210 Gastro-esophageal reflux disease with esophagitis: Secondary | ICD-10-CM | POA: Diagnosis not present

## 2018-03-30 DIAGNOSIS — F0390 Unspecified dementia without behavioral disturbance: Secondary | ICD-10-CM | POA: Diagnosis not present

## 2018-03-30 DIAGNOSIS — E119 Type 2 diabetes mellitus without complications: Secondary | ICD-10-CM | POA: Diagnosis not present

## 2018-03-30 DIAGNOSIS — R600 Localized edema: Secondary | ICD-10-CM | POA: Diagnosis not present

## 2018-03-30 DIAGNOSIS — D500 Iron deficiency anemia secondary to blood loss (chronic): Secondary | ICD-10-CM | POA: Diagnosis not present

## 2018-03-30 DIAGNOSIS — K210 Gastro-esophageal reflux disease with esophagitis: Secondary | ICD-10-CM | POA: Diagnosis not present

## 2018-03-30 DIAGNOSIS — I10 Essential (primary) hypertension: Secondary | ICD-10-CM | POA: Diagnosis not present

## 2018-04-01 DIAGNOSIS — E119 Type 2 diabetes mellitus without complications: Secondary | ICD-10-CM | POA: Diagnosis not present

## 2018-04-01 DIAGNOSIS — K210 Gastro-esophageal reflux disease with esophagitis: Secondary | ICD-10-CM | POA: Diagnosis not present

## 2018-04-01 DIAGNOSIS — D500 Iron deficiency anemia secondary to blood loss (chronic): Secondary | ICD-10-CM | POA: Diagnosis not present

## 2018-04-01 DIAGNOSIS — R600 Localized edema: Secondary | ICD-10-CM | POA: Diagnosis not present

## 2018-04-01 DIAGNOSIS — I10 Essential (primary) hypertension: Secondary | ICD-10-CM | POA: Diagnosis not present

## 2018-04-01 DIAGNOSIS — F0390 Unspecified dementia without behavioral disturbance: Secondary | ICD-10-CM | POA: Diagnosis not present

## 2018-04-05 DIAGNOSIS — D500 Iron deficiency anemia secondary to blood loss (chronic): Secondary | ICD-10-CM | POA: Diagnosis not present

## 2018-04-05 DIAGNOSIS — K210 Gastro-esophageal reflux disease with esophagitis: Secondary | ICD-10-CM | POA: Diagnosis not present

## 2018-04-05 DIAGNOSIS — I10 Essential (primary) hypertension: Secondary | ICD-10-CM | POA: Diagnosis not present

## 2018-04-05 DIAGNOSIS — E119 Type 2 diabetes mellitus without complications: Secondary | ICD-10-CM | POA: Diagnosis not present

## 2018-04-05 DIAGNOSIS — F0390 Unspecified dementia without behavioral disturbance: Secondary | ICD-10-CM | POA: Diagnosis not present

## 2018-04-05 DIAGNOSIS — R600 Localized edema: Secondary | ICD-10-CM | POA: Diagnosis not present

## 2018-04-06 DIAGNOSIS — D500 Iron deficiency anemia secondary to blood loss (chronic): Secondary | ICD-10-CM | POA: Diagnosis not present

## 2018-04-06 DIAGNOSIS — K210 Gastro-esophageal reflux disease with esophagitis: Secondary | ICD-10-CM | POA: Diagnosis not present

## 2018-04-06 DIAGNOSIS — F0390 Unspecified dementia without behavioral disturbance: Secondary | ICD-10-CM | POA: Diagnosis not present

## 2018-04-06 DIAGNOSIS — I10 Essential (primary) hypertension: Secondary | ICD-10-CM | POA: Diagnosis not present

## 2018-04-06 DIAGNOSIS — E119 Type 2 diabetes mellitus without complications: Secondary | ICD-10-CM | POA: Diagnosis not present

## 2018-04-06 DIAGNOSIS — R600 Localized edema: Secondary | ICD-10-CM | POA: Diagnosis not present

## 2018-04-07 DIAGNOSIS — I10 Essential (primary) hypertension: Secondary | ICD-10-CM | POA: Diagnosis not present

## 2018-04-07 DIAGNOSIS — E119 Type 2 diabetes mellitus without complications: Secondary | ICD-10-CM | POA: Diagnosis not present

## 2018-04-07 DIAGNOSIS — D500 Iron deficiency anemia secondary to blood loss (chronic): Secondary | ICD-10-CM | POA: Diagnosis not present

## 2018-04-07 DIAGNOSIS — F0390 Unspecified dementia without behavioral disturbance: Secondary | ICD-10-CM | POA: Diagnosis not present

## 2018-04-07 DIAGNOSIS — K210 Gastro-esophageal reflux disease with esophagitis: Secondary | ICD-10-CM | POA: Diagnosis not present

## 2018-04-07 DIAGNOSIS — R600 Localized edema: Secondary | ICD-10-CM | POA: Diagnosis not present

## 2018-04-12 DIAGNOSIS — R600 Localized edema: Secondary | ICD-10-CM | POA: Diagnosis not present

## 2018-04-12 DIAGNOSIS — F0390 Unspecified dementia without behavioral disturbance: Secondary | ICD-10-CM | POA: Diagnosis not present

## 2018-04-12 DIAGNOSIS — K210 Gastro-esophageal reflux disease with esophagitis: Secondary | ICD-10-CM | POA: Diagnosis not present

## 2018-04-12 DIAGNOSIS — E119 Type 2 diabetes mellitus without complications: Secondary | ICD-10-CM | POA: Diagnosis not present

## 2018-04-12 DIAGNOSIS — I10 Essential (primary) hypertension: Secondary | ICD-10-CM | POA: Diagnosis not present

## 2018-04-12 DIAGNOSIS — D500 Iron deficiency anemia secondary to blood loss (chronic): Secondary | ICD-10-CM | POA: Diagnosis not present

## 2018-04-22 DIAGNOSIS — I10 Essential (primary) hypertension: Secondary | ICD-10-CM | POA: Diagnosis not present

## 2018-04-22 DIAGNOSIS — N939 Abnormal uterine and vaginal bleeding, unspecified: Secondary | ICD-10-CM | POA: Diagnosis not present

## 2018-04-22 DIAGNOSIS — F0390 Unspecified dementia without behavioral disturbance: Secondary | ICD-10-CM | POA: Diagnosis not present

## 2018-04-22 DIAGNOSIS — E119 Type 2 diabetes mellitus without complications: Secondary | ICD-10-CM | POA: Diagnosis not present

## 2018-04-29 DIAGNOSIS — M79675 Pain in left toe(s): Secondary | ICD-10-CM | POA: Diagnosis not present

## 2018-04-29 DIAGNOSIS — B351 Tinea unguium: Secondary | ICD-10-CM | POA: Diagnosis not present

## 2018-04-29 DIAGNOSIS — M79674 Pain in right toe(s): Secondary | ICD-10-CM | POA: Diagnosis not present

## 2018-05-04 DIAGNOSIS — E43 Unspecified severe protein-calorie malnutrition: Secondary | ICD-10-CM | POA: Diagnosis not present

## 2018-05-04 DIAGNOSIS — K210 Gastro-esophageal reflux disease with esophagitis: Secondary | ICD-10-CM | POA: Diagnosis not present

## 2018-05-04 DIAGNOSIS — E119 Type 2 diabetes mellitus without complications: Secondary | ICD-10-CM | POA: Diagnosis not present

## 2018-05-04 DIAGNOSIS — R1311 Dysphagia, oral phase: Secondary | ICD-10-CM | POA: Diagnosis not present

## 2018-05-04 DIAGNOSIS — Z7984 Long term (current) use of oral hypoglycemic drugs: Secondary | ICD-10-CM | POA: Diagnosis not present

## 2018-05-04 DIAGNOSIS — F0390 Unspecified dementia without behavioral disturbance: Secondary | ICD-10-CM | POA: Diagnosis not present

## 2018-05-04 DIAGNOSIS — M1990 Unspecified osteoarthritis, unspecified site: Secondary | ICD-10-CM | POA: Diagnosis not present

## 2018-05-04 DIAGNOSIS — E039 Hypothyroidism, unspecified: Secondary | ICD-10-CM | POA: Diagnosis not present

## 2018-05-04 DIAGNOSIS — D500 Iron deficiency anemia secondary to blood loss (chronic): Secondary | ICD-10-CM | POA: Diagnosis not present

## 2018-05-04 DIAGNOSIS — I110 Hypertensive heart disease with heart failure: Secondary | ICD-10-CM | POA: Diagnosis not present

## 2018-05-04 DIAGNOSIS — I509 Heart failure, unspecified: Secondary | ICD-10-CM | POA: Diagnosis not present

## 2018-05-05 ENCOUNTER — Other Ambulatory Visit: Payer: Self-pay

## 2018-05-06 ENCOUNTER — Encounter: Payer: Self-pay | Admitting: Adult Health

## 2018-05-07 ENCOUNTER — Emergency Department (HOSPITAL_COMMUNITY): Payer: Medicare Other

## 2018-05-07 ENCOUNTER — Emergency Department (HOSPITAL_COMMUNITY)
Admission: EM | Admit: 2018-05-07 | Discharge: 2018-05-07 | Disposition: A | Discharge status: Skilled Nursing Facility | Payer: Medicare Other | Attending: Emergency Medicine | Admitting: Emergency Medicine

## 2018-05-07 ENCOUNTER — Other Ambulatory Visit: Payer: Self-pay

## 2018-05-07 ENCOUNTER — Encounter (HOSPITAL_COMMUNITY): Payer: Self-pay | Admitting: Emergency Medicine

## 2018-05-07 DIAGNOSIS — T148XXA Other injury of unspecified body region, initial encounter: Secondary | ICD-10-CM | POA: Diagnosis not present

## 2018-05-07 DIAGNOSIS — I959 Hypotension, unspecified: Secondary | ICD-10-CM | POA: Diagnosis not present

## 2018-05-07 DIAGNOSIS — S0990XA Unspecified injury of head, initial encounter: Secondary | ICD-10-CM | POA: Diagnosis not present

## 2018-05-07 DIAGNOSIS — Y92129 Unspecified place in nursing home as the place of occurrence of the external cause: Secondary | ICD-10-CM | POA: Diagnosis not present

## 2018-05-07 DIAGNOSIS — E119 Type 2 diabetes mellitus without complications: Secondary | ICD-10-CM | POA: Diagnosis not present

## 2018-05-07 DIAGNOSIS — S8992XA Unspecified injury of left lower leg, initial encounter: Secondary | ICD-10-CM | POA: Diagnosis not present

## 2018-05-07 DIAGNOSIS — Z79899 Other long term (current) drug therapy: Secondary | ICD-10-CM | POA: Diagnosis not present

## 2018-05-07 DIAGNOSIS — Y999 Unspecified external cause status: Secondary | ICD-10-CM | POA: Insufficient documentation

## 2018-05-07 DIAGNOSIS — Z85850 Personal history of malignant neoplasm of thyroid: Secondary | ICD-10-CM | POA: Insufficient documentation

## 2018-05-07 DIAGNOSIS — W19XXXA Unspecified fall, initial encounter: Secondary | ICD-10-CM | POA: Insufficient documentation

## 2018-05-07 DIAGNOSIS — S8002XA Contusion of left knee, initial encounter: Secondary | ICD-10-CM | POA: Diagnosis not present

## 2018-05-07 DIAGNOSIS — E039 Hypothyroidism, unspecified: Secondary | ICD-10-CM | POA: Diagnosis not present

## 2018-05-07 DIAGNOSIS — M79671 Pain in right foot: Secondary | ICD-10-CM | POA: Diagnosis not present

## 2018-05-07 DIAGNOSIS — T07XXXA Unspecified multiple injuries, initial encounter: Secondary | ICD-10-CM

## 2018-05-07 DIAGNOSIS — Y939 Activity, unspecified: Secondary | ICD-10-CM | POA: Diagnosis not present

## 2018-05-07 DIAGNOSIS — R51 Headache: Secondary | ICD-10-CM | POA: Diagnosis not present

## 2018-05-07 DIAGNOSIS — Z7984 Long term (current) use of oral hypoglycemic drugs: Secondary | ICD-10-CM | POA: Diagnosis not present

## 2018-05-07 DIAGNOSIS — M25562 Pain in left knee: Secondary | ICD-10-CM | POA: Insufficient documentation

## 2018-05-07 DIAGNOSIS — M25552 Pain in left hip: Secondary | ICD-10-CM | POA: Diagnosis not present

## 2018-05-07 DIAGNOSIS — G309 Alzheimer's disease, unspecified: Secondary | ICD-10-CM | POA: Insufficient documentation

## 2018-05-07 DIAGNOSIS — S199XXA Unspecified injury of neck, initial encounter: Secondary | ICD-10-CM | POA: Diagnosis not present

## 2018-05-07 DIAGNOSIS — E1165 Type 2 diabetes mellitus with hyperglycemia: Secondary | ICD-10-CM | POA: Diagnosis not present

## 2018-05-07 DIAGNOSIS — S7002XA Contusion of left hip, initial encounter: Secondary | ICD-10-CM | POA: Diagnosis not present

## 2018-05-07 LAB — CBG MONITORING, ED: GLUCOSE-CAPILLARY: 279 mg/dL — AB (ref 70–99)

## 2018-05-07 NOTE — Discharge Instructions (Addendum)
Your x-rays and CTs are negative for fracture or dislocation.  Please use Tylenol extra strength for soreness.  Please see Dr. Luan Pulling, or return to the emergency department if any changes in your condition, problems, or concerns.

## 2018-05-07 NOTE — ED Provider Notes (Signed)
[facility] EMERGENCY DEPARTMENT Provider Note   CSN: [id number] Arrival date & time: 05/07/18  1216     History   Chief Complaint Chief Complaint  Patient presents with  . Fall    HPI Kara Santos is a 84 y.o. female.  Patient is a 84 year old resident of 1 of the local nursing facilities.  The patient presents to the emergency department by EMS because of a fall.  The history is obtained from records from the nursing facility, family, and EMS.  The first story we received was that this was a witnessed fall, and the patient injured the left side.  The second story received was that the patient fall was not witnessed and the patient stated that she hit her head.  The patient states that she does not think she hit her head, but has soreness of her left hip and her left knee.  The history is provided by a relative.    Past Medical History:  Diagnosis Date  . Alzheimer's disease, early onset 80  . Anemia 11/07/2011   chronic IDA requiring multiple transfusions  . AVM (arteriovenous malformation) of stomach, acquired with hemorrhage 12/04/2013  . Closed fracture of greater trochanter of femur ([place]) 02/2013   left  . Depression   . Diabetes mellitus   . Fall at home 11/17/14   multiple rib fractures  . GERD (gastroesophageal reflux disease)   . Hypothyroidism    s/p thyroid surgery for thyroid cancer  . Iron deficiency anemia 05/05/2012    Patient Active Problem List   Diagnosis Date Noted  . Protein-calorie malnutrition, severe 12/25/2017  . Dehydration   . Goals of care, counseling/discussion   . Palliative care by specialist   . Encounter for hospice care discussion   . AKI (acute kidney injury) ([place]) 12/23/2017  . GI bleed 08/07/2015  . Mandibular fracture, closed ([place]) 08/07/2015  . Acute encephalopathy 08/07/2015  . Hematochezia 08/07/2015  . Fall 11/20/2014  . Multiple rib fractures 11/17/2014  . AVM (arteriovenous malformation) of stomach, acquired with hemorrhage  12/04/2013  . Closed fracture of greater trochanter of femur ([place]) 02/06/2013  . History of fall 02/06/2013  . Iron deficiency anemia 05/05/2012  . Chronic gastrointestinal bleeding 03/04/2012  . Diabetes mellitus type 2 in nonobese ([place]) 03/04/2012  . Hypothyroidism 03/04/2012  . History of thyroid cancer 03/04/2012    Past Surgical History:  Procedure Laterality Date  . APPENDECTOMY    . bilateral cataract extractions    . BREAST REDUCTION SURGERY    . CHOLECYSTECTOMY    . COLONOSCOPY  08/2006   Dr. Cecelia Byars sided diverticulosis  . double balloon enteroscopy  06/1008   [facility]-->APC of 12 SB AVMs in jejunum. 6 feet of jejunum inspected  . ESOPHAGOGASTRODUODENOSCOPY  08/2006   Dr. Trevor Iha [facility], large duodenal diverticulum  . Givens small bowel capsule  08/2006   few gastric erosions, numerous small bowel AVMs with SB erosions  . right shoulder surgery    . THYROID SURGERY     thyroid cancer     OB History   None      Home Medications    Prior to Admission medications   Medication Sig Start Date End Date Taking? Authorizing Provider  acetaminophen (TYLENOL) 500 MG tablet Take 1,000 mg by mouth 3 (three) times daily.    [provider]  benzocaine-resorcinol (VAGISIL) 5-2 % vaginal cream Place 1 Applicatorful vaginally daily as needed for itching or irritation.    [provider]  cyanocobalamin 100 MCG tablet Take 100 mcg by mouth daily.    [provider]  ferrous sulfate 325 (65 FE) MG tablet Take 325 mg by mouth daily with breakfast.    [provider]  furosemide (LASIX) 20 MG tablet  01/14/18   [provider]  guaiFENesin-dextromethorphan (ROBITUSSIN DM) 100-10 MG/5ML syrup Take 10 mLs by mouth every 6 (six) hours as needed for cough.    [provider]  levothyroxine (SYNTHROID, LEVOTHROID) 25 MCG tablet Take 25 mcg by mouth daily before breakfast.  08/12/17   [provider]  loperamide (IMODIUM  A-D) 2 MG tablet Take 2 mg by mouth every 2 (two) hours as needed for diarrhea or loose stools.    [provider]  metFORMIN (GLUCOPHAGE-XR) 500 MG 24 hr tablet Take 500 mg by mouth daily with breakfast.     [provider]  Multiple Vitamin (MULTIVITAMIN) tablet Take 1 tablet by mouth daily.    [provider]  ondansetron (ZOFRAN) 4 MG tablet Take 4 mg by mouth 4 (four) times daily as needed for nausea or vomiting.    [provider]  oseltamivir (TAMIFLU) 75 MG capsule Take 75 mg by mouth daily.    [provider]  pantoprazole (PROTONIX) 40 MG tablet Take 40 mg by mouth daily.    [provider]  potassium chloride SA (K-DUR,KLOR-CON) 20 MEQ tablet  03/09/18   [provider]  sertraline (ZOLOFT) 100 MG tablet Take 1 tablet by mouth daily. 12/05/17   [provider]  sulfamethoxazole-trimethoprim (BACTRIM DS,SEPTRA DS) 800-160 MG tablet Take 1 tablet by mouth 2 (two) times daily.    [provider]  traMADol (ULTRAM) 50 MG tablet Take 50 mg by mouth every 6 (six) hours as needed for moderate pain. Pain not relieved by Tylenol.    [provider]    Family History Family History  Problem Relation Age of Onset  . Cancer Mother   . Cancer Father   . Diabetes Father   . Diabetes Sister   . Cancer Brother     Social History Social History   Tobacco Use  . Smoking status: Never Smoker  . Smokeless tobacco: Never Used  Substance Use Topics  . Alcohol use: No  . Drug use: No     Allergies   Asa [aspirin]   Review of Systems Review of Systems  Constitutional: Negative for activity change.       All ROS Neg except as noted in HPI  HENT: Positive for hearing loss. Negative for nosebleeds.   Eyes: Negative for photophobia and discharge.  Respiratory: Negative for cough, shortness of breath and wheezing.   Cardiovascular: Negative for chest pain and palpitations.  Gastrointestinal: Negative for  abdominal pain and blood in stool.  Genitourinary: Negative for dysuria, frequency and hematuria.  Musculoskeletal: Positive for arthralgias and back pain. Negative for neck pain.  Skin: Negative.   Neurological: Negative for dizziness, seizures and speech difficulty.  Psychiatric/Behavioral: Negative for confusion and hallucinations.     Physical Exam Updated Vital Signs BP (!) 99/55   Pulse 80   Temp 97.7 F (36.5 C) (Oral)   Resp 18   Ht 4\' 9"  (1.448 m)   Wt 44.5 kg (98 lb)   SpO2 99%   BMI 21.21 kg/m   Physical Exam  HENT:  Head: Head is without laceration.    There is soreness to palpation of the left scalp.  No visible or palpable hematoma.  Negative battle sign.  Musculoskeletal:       Left hip: She exhibits tenderness.       Left knee: Tenderness found. Lateral joint line tenderness noted.     ED Treatments / Results  Labs (all labs ordered are listed, but only abnormal results are displayed) Labs Reviewed  CBG MONITORING, ED - Abnormal; Notable for the following components:      Result Value   Glucose-Capillary 279 (*)    All other components within normal limits    EKG None  Radiology No results found.  Procedures Procedures (including critical care time)  Medications Ordered in ED Medications - No data to display   Initial Impression / Assessment and Plan / ED Course  I have reviewed the triage vital signs and the nursing notes.  Pertinent labs & imaging results that were available during my care of the patient were reviewed by me and considered in my medical decision making (see chart for details).       Final Clinical Impressions(s) / ED Diagnoses  MDM Patient sustained a fall at a local nursing facility.  The patient will receive CT scan of the head neck, and plain x-ray films of the left hip and knee.  No gross neurologic deficit appreciated on examination.  There is no fracture noted on the CT scan of the head, neck, left knee and  left hip.  Instructions given for the patient to use her walker, or to have someone up with her when she is up and about.  The patient is to use Tylenol extra strength for soreness if needed.  Patient is in agreement with this plan.   Final diagnoses:  Fall, initial encounter  Contusion, multiple sites    ED Discharge Orders    None       Lily Kocher, Hershal Coria 05/07/18 2315    Virgel Manifold, MD 05/09/18 [phone]

## 2018-05-07 NOTE — ED Notes (Signed)
Pt taken to xray. Nad.

## 2018-05-07 NOTE — ED Notes (Signed)
Cancelled EMS transfer back to [facility].  Staff from [facility] is coming to get the Pt, per RN.

## 2018-05-07 NOTE — ED Triage Notes (Signed)
Pt from [place] unit of [facility], [place]. Pt had witnessed fall. Staff states pt stood up and then sat on floor on her bottom. Pt c/o left knee pain. No obvious deformity noted. A/o to some. ble swelling. cbg 521

## 2018-05-07 NOTE — ED Notes (Signed)
Called for transport back to [facility].

## 2018-05-10 DIAGNOSIS — R1311 Dysphagia, oral phase: Secondary | ICD-10-CM | POA: Diagnosis not present

## 2018-05-10 DIAGNOSIS — D500 Iron deficiency anemia secondary to blood loss (chronic): Secondary | ICD-10-CM | POA: Diagnosis not present

## 2018-05-10 DIAGNOSIS — I110 Hypertensive heart disease with heart failure: Secondary | ICD-10-CM | POA: Diagnosis not present

## 2018-05-10 DIAGNOSIS — E119 Type 2 diabetes mellitus without complications: Secondary | ICD-10-CM | POA: Diagnosis not present

## 2018-05-10 DIAGNOSIS — F0390 Unspecified dementia without behavioral disturbance: Secondary | ICD-10-CM | POA: Diagnosis not present

## 2018-05-10 DIAGNOSIS — I509 Heart failure, unspecified: Secondary | ICD-10-CM | POA: Diagnosis not present

## 2018-05-14 DIAGNOSIS — E119 Type 2 diabetes mellitus without complications: Secondary | ICD-10-CM | POA: Diagnosis not present

## 2018-05-14 DIAGNOSIS — I509 Heart failure, unspecified: Secondary | ICD-10-CM | POA: Diagnosis not present

## 2018-05-14 DIAGNOSIS — R1311 Dysphagia, oral phase: Secondary | ICD-10-CM | POA: Diagnosis not present

## 2018-05-14 DIAGNOSIS — D500 Iron deficiency anemia secondary to blood loss (chronic): Secondary | ICD-10-CM | POA: Diagnosis not present

## 2018-05-14 DIAGNOSIS — F0390 Unspecified dementia without behavioral disturbance: Secondary | ICD-10-CM | POA: Diagnosis not present

## 2018-05-14 DIAGNOSIS — I110 Hypertensive heart disease with heart failure: Secondary | ICD-10-CM | POA: Diagnosis not present

## 2018-05-15 ENCOUNTER — Emergency Department (HOSPITAL_COMMUNITY)
Admission: EM | Admit: 2018-05-15 | Discharge: 2018-05-15 | Disposition: A | Discharge status: Home / Self Care | Payer: Medicare Other | Attending: Emergency Medicine | Admitting: Emergency Medicine

## 2018-05-15 ENCOUNTER — Encounter (HOSPITAL_COMMUNITY): Payer: Self-pay | Admitting: Emergency Medicine

## 2018-05-15 DIAGNOSIS — Y92129 Unspecified place in nursing home as the place of occurrence of the external cause: Secondary | ICD-10-CM | POA: Insufficient documentation

## 2018-05-15 DIAGNOSIS — Z79899 Other long term (current) drug therapy: Secondary | ICD-10-CM | POA: Diagnosis not present

## 2018-05-15 DIAGNOSIS — Z043 Encounter for examination and observation following other accident: Secondary | ICD-10-CM | POA: Insufficient documentation

## 2018-05-15 DIAGNOSIS — Z7984 Long term (current) use of oral hypoglycemic drugs: Secondary | ICD-10-CM | POA: Diagnosis not present

## 2018-05-15 DIAGNOSIS — Y998 Other external cause status: Secondary | ICD-10-CM | POA: Insufficient documentation

## 2018-05-15 DIAGNOSIS — M25552 Pain in left hip: Secondary | ICD-10-CM | POA: Diagnosis not present

## 2018-05-15 DIAGNOSIS — E119 Type 2 diabetes mellitus without complications: Secondary | ICD-10-CM | POA: Diagnosis not present

## 2018-05-15 DIAGNOSIS — W19XXXA Unspecified fall, initial encounter: Secondary | ICD-10-CM | POA: Diagnosis not present

## 2018-05-15 DIAGNOSIS — Z85850 Personal history of malignant neoplasm of thyroid: Secondary | ICD-10-CM | POA: Insufficient documentation

## 2018-05-15 DIAGNOSIS — G309 Alzheimer's disease, unspecified: Secondary | ICD-10-CM | POA: Diagnosis not present

## 2018-05-15 DIAGNOSIS — E039 Hypothyroidism, unspecified: Secondary | ICD-10-CM | POA: Insufficient documentation

## 2018-05-15 DIAGNOSIS — Y939 Activity, unspecified: Secondary | ICD-10-CM | POA: Diagnosis not present

## 2018-05-15 DIAGNOSIS — Z0389 Encounter for observation for other suspected diseases and conditions ruled out: Secondary | ICD-10-CM | POA: Diagnosis not present

## 2018-05-15 LAB — CBG MONITORING, ED: Glucose-Capillary: 119 mg/dL — ABNORMAL HIGH (ref 70–99)

## 2018-05-15 NOTE — Discharge Instructions (Signed)
No signs of injury, return for increased bruising pain seizures vomiting or any other concerning symptoms

## 2018-05-15 NOTE — ED Triage Notes (Signed)
Pt fell at [facility] and she is c/o of right knee pain

## 2018-05-15 NOTE — ED Provider Notes (Signed)
Patient is a well-appearing 84 year old female who presents from her facility where she lives after she had an unwitnessed fall.  The patient has no complaints and there is no signs of trauma, no bruising, swelling, deformity, her mental status is bright, cheery and she is able to follow commands.  Joints are supple, compartments are soft, cardiac exam is unremarkable, stable for discharge, patient's family member is here and agreeable with the plan.  She does not want imaging.  Medical screening examination/treatment/procedure(s) were conducted as a shared visit with non-physician practitioner(s) and myself.  I personally evaluated the patient during the encounter.  Clinical Impression:   Final diagnoses:  Fall, initial encounter         Noemi Chapel, MD 05/16/18 [phone]

## 2018-05-15 NOTE — ED Provider Notes (Signed)
[facility] EMERGENCY DEPARTMENT Provider Note   CSN: [id number] Arrival date & time: 05/15/18  1221   History   Chief Complaint Chief Complaint  Patient presents with  . Fall    HPI Kara Santos is a 84 y.o. female with a history of dementia who presents to the ED for evaluation after a fall at [facility]. This fall was unwitnessed. She has no complains, denies pain and denies decreased ROM in any of her extremities. Denies hitting her head. Per patients daughter who is present in the room the patient forgets to use her walker and has frequent falls. Her last fall was 05/07/18. Her workup at the prior visit was negative.   Past Medical History:  Diagnosis Date  . Alzheimer's disease, early onset 49  . Anemia 11/07/2011   chronic IDA requiring multiple transfusions  . AVM (arteriovenous malformation) of stomach, acquired with hemorrhage 12/04/2013  . Closed fracture of greater trochanter of femur ([place]) 02/2013   left  . Depression   . Diabetes mellitus   . Fall at home 11/17/14   multiple rib fractures  . GERD (gastroesophageal reflux disease)   . Hypothyroidism    s/p thyroid surgery for thyroid cancer  . Iron deficiency anemia 05/05/2012    Patient Active Problem List   Diagnosis Date Noted  . Protein-calorie malnutrition, severe 12/25/2017  . Dehydration   . Goals of care, counseling/discussion   . Palliative care by specialist   . Encounter for hospice care discussion   . AKI (acute kidney injury) ([place]) 12/23/2017  . GI bleed 08/07/2015  . Mandibular fracture, closed ([place]) 08/07/2015  . Acute encephalopathy 08/07/2015  . Hematochezia 08/07/2015  . Fall 11/20/2014  . Multiple rib fractures 11/17/2014  . AVM (arteriovenous malformation) of stomach, acquired with hemorrhage 12/04/2013  . Closed fracture of greater trochanter of femur ([place]) 02/06/2013  . History of fall 02/06/2013  . Iron deficiency anemia 05/05/2012  . Chronic gastrointestinal bleeding  03/04/2012  . Diabetes mellitus type 2 in nonobese ([place]) 03/04/2012  . Hypothyroidism 03/04/2012  . History of thyroid cancer 03/04/2012    Past Surgical History:  Procedure Laterality Date  . APPENDECTOMY    . bilateral cataract extractions    . BREAST REDUCTION SURGERY    . CHOLECYSTECTOMY    . COLONOSCOPY  08/2006   Dr. Cecelia Byars sided diverticulosis  . double balloon enteroscopy  06/1008   [facility]-->APC of 12 SB AVMs in jejunum. 6 feet of jejunum inspected  . ESOPHAGOGASTRODUODENOSCOPY  08/2006   Dr. Trevor Iha [facility], large duodenal diverticulum  . Givens small bowel capsule  08/2006   few gastric erosions, numerous small bowel AVMs with SB erosions  . right shoulder surgery    . THYROID SURGERY     thyroid cancer     OB History   None      Home Medications    Prior to Admission medications   Medication Sig Start Date End Date Taking? Authorizing Provider  acetaminophen (TYLENOL) 500 MG tablet Take 1,000 mg by mouth 3 (three) times daily.    [provider]  benzocaine-resorcinol (VAGISIL) 5-2 % vaginal cream Place 1 Applicatorful vaginally daily as needed for itching or irritation.    [provider]  cyanocobalamin 100 MCG tablet Take 100 mcg by mouth daily.    [provider]  ferrous sulfate 325 (65 FE) MG tablet Take 325 mg by mouth daily with breakfast.    [provider]  furosemide (LASIX) 40 MG  tablet Take 40 mg by mouth daily.  01/14/18   [provider]  guaiFENesin-dextromethorphan (ROBITUSSIN DM) 100-10 MG/5ML syrup Take 10 mLs by mouth every 6 (six) hours as needed for cough.    [provider]  levothyroxine (SYNTHROID, LEVOTHROID) 25 MCG tablet Take 25 mcg by mouth daily before breakfast.  08/12/17   [provider]  loperamide (IMODIUM A-D) 2 MG tablet Take 2 mg by mouth every 2 (two) hours as needed for diarrhea or loose stools.    [provider]  metFORMIN (GLUCOPHAGE-XR) 500 MG  24 hr tablet Take 500 mg by mouth daily with breakfast.     [provider]  Multiple Vitamin (MULTIVITAMIN) tablet Take 1 tablet by mouth daily.    [provider]  ondansetron (ZOFRAN) 4 MG tablet Take 4 mg by mouth 4 (four) times daily as needed for nausea or vomiting.    [provider]  pantoprazole (PROTONIX) 40 MG tablet Take 40 mg by mouth daily.    [provider]  potassium chloride SA (K-DUR,KLOR-CON) 20 MEQ tablet  03/09/18   [provider]  sertraline (ZOLOFT) 100 MG tablet Take 1 tablet by mouth daily. 12/05/17   [provider]  traMADol (ULTRAM) 50 MG tablet Take 50 mg by mouth every 6 (six) hours as needed for moderate pain. Pain not relieved by Tylenol.    [provider]    Family History Family History  Problem Relation Age of Onset  . Cancer Mother   . Cancer Father   . Diabetes Father   . Diabetes Sister   . Cancer Brother     Social History Social History   Tobacco Use  . Smoking status: Never Smoker  . Smokeless tobacco: Never Used  Substance Use Topics  . Alcohol use: No  . Drug use: No     Allergies   Asa [aspirin]   Review of Systems Review of Systems  Constitutional: Negative for diaphoresis.  Respiratory: Negative for cough, chest tightness and shortness of breath.   Cardiovascular: Negative for chest pain, palpitations and leg swelling.  Skin: Negative for color change, pallor, rash and wound.  Neurological: Negative for dizziness, speech difficulty, weakness, light-headedness and headaches.  All other systems reviewed and are negative.    Physical Exam Updated Vital Signs BP 101/69 (BP Location: Right Arm)   Pulse 85   Temp 97.8 F (36.6 C) (Oral)   Resp 15   Ht 5' (1.524 m)   Wt 49.9 kg   SpO2 100%   BMI 21.48 kg/m   Physical Exam  Constitutional: No distress.  HENT:  Head: Normocephalic and atraumatic.  No contusions or signs of head trauma  Eyes: Pupils are  equal, round, and reactive to light.  Neck: Normal range of motion.  Cardiovascular: Normal rate, regular rhythm, normal heart sounds and intact distal pulses.  Pulmonary/Chest: Effort normal and breath sounds normal. No respiratory distress. She exhibits no tenderness.  Abdominal: Soft. Bowel sounds are normal. She exhibits no distension. There is no tenderness. There is no guarding.  Musculoskeletal: Normal range of motion. She exhibits no edema, tenderness or deformity.  Moves all extremities equally and without difficulty.  Neurological: She is alert. No cranial nerve deficit or sensory deficit. Coordination normal.  Skin: Skin is warm and dry. She is not diaphoretic. No erythema.  No ecchymosis   Psychiatric: She has a normal mood and affect.     ED Treatments / Results  Labs (all labs ordered  are listed, but only abnormal results are displayed) Labs Reviewed  CBG MONITORING, ED - Abnormal; Notable for the following components:      Result Value   Glucose-Capillary 119 (*)    All other components within normal limits    EKG None  Radiology No results found.  Procedures Procedures (including critical care time)  Medications Ordered in ED Medications - No data to display   Initial Impression / Assessment and Plan / ED Course  I have reviewed the triage vital signs and the nursing notes and the patients past medical history records.  Pertinent labs & imaging results that were available during my care of the patient were reviewed by me and considered in my medical decision making (see chart for details).  84 year old presents post unwitnessed fall at her nursing facility in the bathroom. Per patient daughter patient has a history of frequent falls secondary to not using her walker. Denies pain or decreased ROM in her extremities. Non focal neuro exam. Glucose is normal. Family and patient do not want imaging at this time. Given her physical exam without signs of trauma and  patient is able to freely move her extremities without pain or decreased ROM and no tenderness to the head or neck I feel we do not need imaging at this time. Discussed with family return precautions. Family voice understanding at this time.     Final Clinical Impressions(s) / ED Diagnoses   Final diagnoses:  Fall, initial encounter    ED Discharge Orders    None       Aeneas Longsworth A, PA-C 05/15/18 1444    Noemi Chapel, MD 05/16/18 [phone]

## 2018-05-18 ENCOUNTER — Ambulatory Visit (INDEPENDENT_AMBULATORY_CARE_PROVIDER_SITE_OTHER): Payer: Medicare Other | Admitting: Adult Health

## 2018-05-18 ENCOUNTER — Encounter: Payer: Self-pay | Admitting: Adult Health

## 2018-05-18 VITALS — BP 119/65 | HR 88 | Ht 60.0 in | Wt 92.0 lb

## 2018-05-18 DIAGNOSIS — N950 Postmenopausal bleeding: Secondary | ICD-10-CM | POA: Diagnosis not present

## 2018-05-18 DIAGNOSIS — F0390 Unspecified dementia without behavioral disturbance: Secondary | ICD-10-CM | POA: Diagnosis not present

## 2018-05-18 DIAGNOSIS — D500 Iron deficiency anemia secondary to blood loss (chronic): Secondary | ICD-10-CM | POA: Diagnosis not present

## 2018-05-18 DIAGNOSIS — N952 Postmenopausal atrophic vaginitis: Secondary | ICD-10-CM | POA: Diagnosis not present

## 2018-05-18 NOTE — Progress Notes (Signed)
  Subjective:     Patient ID: Kara Santos, female   DOB: 1917-04-14, 84 y.o.   MRN: [id number]  HPI Kara Santos is a 84 year old white female, who resides at [facility] in [place], in for vaginal bleeding. 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   Review of Systems +vaginal bleeding +tired Reviewed past medical,surgical, social and family history. Reviewed medications and allergies.     Objective:   Physical Exam BP 119/65 (BP Location: Left Arm, Patient Position: Sitting, Cuff Size: Normal)   Pulse 88   Ht 5' (1.524 m)   Wt 92 lb (41.7 kg)   BMI 17.97 kg/m  She walks with walker and has on a diaper. Skin warm and dry.Pelvic: external genitalia is slightly red, no lesions, vagina:pale with loss of moisture and rugae,urethra has no lesions or masses noted, cervix is not visualized, uterus: normal size, shape and contour, non tender, no masses felt, adnexa: no masses or tenderness noted. Bladder is non tender and no masses felt.    After the exam she tells me she is not pregnant. Will get [place] to assess uterus.    Assessment:     1. PMB (postmenopausal bleeding)   2. Vaginal atrophy   3. Iron deficiency anemia due to chronic blood loss   4. Dementia without behavioral disturbance, unspecified dementia type       Plan:     Return in 1 week for GYN [place] to assess uterus Will talk when results back

## 2018-05-21 DIAGNOSIS — I110 Hypertensive heart disease with heart failure: Secondary | ICD-10-CM | POA: Diagnosis not present

## 2018-05-21 DIAGNOSIS — E119 Type 2 diabetes mellitus without complications: Secondary | ICD-10-CM | POA: Diagnosis not present

## 2018-05-21 DIAGNOSIS — D500 Iron deficiency anemia secondary to blood loss (chronic): Secondary | ICD-10-CM | POA: Diagnosis not present

## 2018-05-21 DIAGNOSIS — I509 Heart failure, unspecified: Secondary | ICD-10-CM | POA: Diagnosis not present

## 2018-05-21 DIAGNOSIS — F0390 Unspecified dementia without behavioral disturbance: Secondary | ICD-10-CM | POA: Diagnosis not present

## 2018-05-21 DIAGNOSIS — R1311 Dysphagia, oral phase: Secondary | ICD-10-CM | POA: Diagnosis not present

## 2018-05-25 ENCOUNTER — Ambulatory Visit (INDEPENDENT_AMBULATORY_CARE_PROVIDER_SITE_OTHER): Payer: Medicare Other

## 2018-05-25 DIAGNOSIS — N950 Postmenopausal bleeding: Secondary | ICD-10-CM

## 2018-05-25 NOTE — Progress Notes (Addendum)
PELVIC US TA/TV:atropic heterogeneous anteverted uterus w/mult calcification,thickened endometrium w/color flow 6.3 mm,limited view of endometrium,ovaries not visualized,bilat adnexa's wnl,no free fluid

## 2018-05-26 DIAGNOSIS — I110 Hypertensive heart disease with heart failure: Secondary | ICD-10-CM | POA: Diagnosis not present

## 2018-05-26 DIAGNOSIS — R1311 Dysphagia, oral phase: Secondary | ICD-10-CM | POA: Diagnosis not present

## 2018-05-26 DIAGNOSIS — E119 Type 2 diabetes mellitus without complications: Secondary | ICD-10-CM | POA: Diagnosis not present

## 2018-05-26 DIAGNOSIS — I509 Heart failure, unspecified: Secondary | ICD-10-CM | POA: Diagnosis not present

## 2018-05-26 DIAGNOSIS — D500 Iron deficiency anemia secondary to blood loss (chronic): Secondary | ICD-10-CM | POA: Diagnosis not present

## 2018-05-26 DIAGNOSIS — F0390 Unspecified dementia without behavioral disturbance: Secondary | ICD-10-CM | POA: Diagnosis not present

## 2018-05-28 ENCOUNTER — Telehealth: Payer: Self-pay | Admitting: Adult Health

## 2018-05-28 DIAGNOSIS — N950 Postmenopausal bleeding: Secondary | ICD-10-CM

## 2018-05-28 NOTE — Telephone Encounter (Signed)
Called [facility] and spoke with Floweree, and made her aware that US showed endometrial thickness, will schedule appt with Dr Elonda Husky for possible endometrial biopsy

## 2018-06-16 ENCOUNTER — Other Ambulatory Visit: Payer: Self-pay

## 2018-06-16 ENCOUNTER — Emergency Department (HOSPITAL_COMMUNITY): Payer: Medicare Other

## 2018-06-16 ENCOUNTER — Encounter (HOSPITAL_COMMUNITY): Payer: Self-pay | Admitting: *Deleted

## 2018-06-16 ENCOUNTER — Emergency Department (HOSPITAL_COMMUNITY)
Admission: EM | Admit: 2018-06-16 | Discharge: 2018-06-17 | Disposition: A | Discharge status: Home / Self Care | Payer: Medicare Other | Attending: Emergency Medicine | Admitting: Emergency Medicine

## 2018-06-16 ENCOUNTER — Other Ambulatory Visit (HOSPITAL_COMMUNITY): Payer: Self-pay | Admitting: Internal Medicine

## 2018-06-16 DIAGNOSIS — S40912A Unspecified superficial injury of left shoulder, initial encounter: Secondary | ICD-10-CM | POA: Diagnosis present

## 2018-06-16 DIAGNOSIS — Z79899 Other long term (current) drug therapy: Secondary | ICD-10-CM | POA: Insufficient documentation

## 2018-06-16 DIAGNOSIS — E119 Type 2 diabetes mellitus without complications: Secondary | ICD-10-CM | POA: Diagnosis not present

## 2018-06-16 DIAGNOSIS — Z7984 Long term (current) use of oral hypoglycemic drugs: Secondary | ICD-10-CM | POA: Diagnosis not present

## 2018-06-16 DIAGNOSIS — W19XXXA Unspecified fall, initial encounter: Secondary | ICD-10-CM | POA: Diagnosis not present

## 2018-06-16 DIAGNOSIS — W1789XA Other fall from one level to another, initial encounter: Secondary | ICD-10-CM | POA: Diagnosis not present

## 2018-06-16 DIAGNOSIS — F0390 Unspecified dementia without behavioral disturbance: Secondary | ICD-10-CM | POA: Diagnosis not present

## 2018-06-16 DIAGNOSIS — M25512 Pain in left shoulder: Secondary | ICD-10-CM | POA: Diagnosis not present

## 2018-06-16 DIAGNOSIS — S42292A Other displaced fracture of upper end of left humerus, initial encounter for closed fracture: Secondary | ICD-10-CM | POA: Diagnosis not present

## 2018-06-16 DIAGNOSIS — Y92129 Unspecified place in nursing home as the place of occurrence of the external cause: Secondary | ICD-10-CM | POA: Diagnosis not present

## 2018-06-16 DIAGNOSIS — Y999 Unspecified external cause status: Secondary | ICD-10-CM | POA: Diagnosis not present

## 2018-06-16 DIAGNOSIS — G309 Alzheimer's disease, unspecified: Secondary | ICD-10-CM | POA: Insufficient documentation

## 2018-06-16 DIAGNOSIS — E039 Hypothyroidism, unspecified: Secondary | ICD-10-CM | POA: Diagnosis not present

## 2018-06-16 DIAGNOSIS — S42202A Unspecified fracture of upper end of left humerus, initial encounter for closed fracture: Secondary | ICD-10-CM

## 2018-06-16 DIAGNOSIS — Y939 Activity, unspecified: Secondary | ICD-10-CM | POA: Diagnosis not present

## 2018-06-16 DIAGNOSIS — R5381 Other malaise: Secondary | ICD-10-CM | POA: Diagnosis not present

## 2018-06-16 DIAGNOSIS — S42252A Displaced fracture of greater tuberosity of left humerus, initial encounter for closed fracture: Secondary | ICD-10-CM | POA: Diagnosis not present

## 2018-06-16 NOTE — ED Provider Notes (Signed)
[facility] EMERGENCY DEPARTMENT Provider Note   CSN: [id number] Arrival date & time: 06/16/18  2236     History   Chief Complaint Chief Complaint  Patient presents with  . Fall    HPI Kara Santos is a 84 y.o. female.  Patient is a 85 year old female with past medical history of dementia, anemia, diabetes.  She is brought by EMS for evaluation of fall.  She attempted to get up at her assisted living facility when she fell and injured her left shoulder.  She has no other complaints.  I am told from EMS in the nursing home that there was no loss of consciousness or no obvious head injury.  The history is provided by the patient, the EMS personnel and the nursing home.  Fall  This is a new problem. The current episode started less than 1 hour ago. The problem occurs constantly. The problem has not changed since onset.Pertinent negatives include no chest pain, no headaches and no shortness of breath. Exacerbated by: Movement and palpation. Nothing relieves the symptoms. She has tried nothing for the symptoms.    Past Medical History:  Diagnosis Date  . Alzheimer's disease, early onset 51  . Anemia 11/07/2011   chronic IDA requiring multiple transfusions  . AVM (arteriovenous malformation) of stomach, acquired with hemorrhage 12/04/2013  . Closed fracture of greater trochanter of femur ([place]) 02/2013   left  . Depression   . Diabetes mellitus   . Fall at home 11/17/14   multiple rib fractures  . GERD (gastroesophageal reflux disease)   . Hypothyroidism    s/p thyroid surgery for thyroid cancer  . Iron deficiency anemia 05/05/2012    Patient Active Problem List   Diagnosis Date Noted  . Iron deficiency anemia due to chronic blood loss 05/18/2018  . Vaginal atrophy 05/18/2018  . PMB (postmenopausal bleeding) 05/18/2018  . Dementia without behavioral disturbance 05/18/2018  . Protein-calorie malnutrition, severe 12/25/2017  . Dehydration   . Goals of care, counseling/discussion    . Palliative care by specialist   . Encounter for hospice care discussion   . AKI (acute kidney injury) ([place]) 12/23/2017  . GI bleed 08/07/2015  . Mandibular fracture, closed ([place]) 08/07/2015  . Acute encephalopathy 08/07/2015  . Hematochezia 08/07/2015  . Fall 11/20/2014  . Multiple rib fractures 11/17/2014  . AVM (arteriovenous malformation) of stomach, acquired with hemorrhage 12/04/2013  . Closed fracture of greater trochanter of femur ([place]) 02/06/2013  . History of fall 02/06/2013  . Iron deficiency anemia 05/05/2012  . Chronic gastrointestinal bleeding 03/04/2012  . Diabetes mellitus type 2 in nonobese ([place]) 03/04/2012  . Hypothyroidism 03/04/2012  . History of thyroid cancer 03/04/2012    Past Surgical History:  Procedure Laterality Date  . APPENDECTOMY    . bilateral cataract extractions    . BREAST REDUCTION SURGERY    . CHOLECYSTECTOMY    . COLONOSCOPY  08/2006   Dr. Cecelia Byars sided diverticulosis  . double balloon enteroscopy  06/1008   [facility]-->APC of 12 SB AVMs in jejunum. 6 feet of jejunum inspected  . ESOPHAGOGASTRODUODENOSCOPY  08/2006   Dr. Trevor Iha h[facility], large duodenal diverticulum  . Givens small bowel capsule  08/2006   few gastric erosions, numerous small bowel AVMs with SB erosions  . right shoulder surgery    . THYROID SURGERY     thyroid cancer     OB History    Gravida  1   Para  1   Term  Preterm      AB      Living  1     SAB      TAB      Ectopic      Multiple      Live Births               Home Medications    Prior to Admission medications   Medication Sig Start Date End Date Taking? Authorizing Provider  acetaminophen (TYLENOL) 500 MG tablet Take 1,000 mg by mouth 3 (three) times daily.   Yes [provider]  benzocaine-resorcinol (VAGISIL) 5-2 % vaginal cream Place 2 g vaginally daily as needed for itching or irritation.    Yes [provider]  cyanocobalamin 100 MCG tablet Take 100  mcg by mouth daily.   Yes [provider]  ferrous sulfate 325 (65 FE) MG tablet Take 325 mg by mouth daily with breakfast.   Yes [provider]  furosemide (LASIX) 40 MG tablet Take 40 mg by mouth daily.  01/14/18  Yes [provider]  guaiFENesin-dextromethorphan (ROBITUSSIN DM) 100-10 MG/5ML syrup Take 10 mLs by mouth every 6 (six) hours as needed for cough.   Yes [provider]  levothyroxine (SYNTHROID, LEVOTHROID) 25 MCG tablet Take 25 mcg by mouth daily before breakfast.  08/12/17  Yes [provider]  loperamide (IMODIUM A-D) 2 MG tablet Take 2 mg by mouth every 2 (two) hours as needed for diarrhea or loose stools.   Yes [provider]  meclizine (ANTIVERT) 25 MG tablet Take 25 mg by mouth 3 (three) times daily.   Yes [provider]  metFORMIN (GLUCOPHAGE-XR) 500 MG 24 hr tablet Take 500 mg by mouth daily with breakfast.    Yes [provider]  Multiple Vitamin (MULTIVITAMIN) tablet Take 1 tablet by mouth daily.   Yes [provider]  nystatin-triamcinolone (MYCOLOG II) cream Apply 1 application topically 2 (two) times daily. For redness of corners   Yes [provider]  ondansetron (ZOFRAN) 4 MG tablet Take 4 mg by mouth 4 (four) times daily as needed for nausea or vomiting.   Yes [provider]  pantoprazole (PROTONIX) 40 MG tablet Take 40 mg by mouth daily.   Yes [provider]  potassium chloride SA (K-DUR,KLOR-CON) 20 MEQ tablet Take 20 mEq by mouth daily.  03/09/18  Yes [provider]  sertraline (ZOLOFT) 100 MG tablet Take 1 tablet by mouth daily. 12/05/17  Yes [provider]  traMADol (ULTRAM) 50 MG tablet Take 50 mg by mouth every 6 (six) hours as needed for moderate pain. Pain not relieved by Tylenol.   Yes [provider]    Family History Family History  Problem Relation Age of Onset  . Cancer Mother   . Cancer Father   . Diabetes Father     . Diabetes Sister   . Cancer Brother     Social History Social History   Tobacco Use  . Smoking status: Never Smoker  . Smokeless tobacco: Never Used  Substance Use Topics  . Alcohol use: No  . Drug use: No     Allergies   Asa [aspirin]   Review of Systems Review of Systems  Unable to perform ROS: Dementia  Respiratory: Negative for shortness of breath.   Cardiovascular: Negative for chest pain.  Neurological: Negative for headaches.     Physical Exam Updated Vital Signs BP (!) 148/90 (BP Location: Right Arm)   Pulse 88  Temp 97.7 F (36.5 C) (Oral)   Resp 20   Ht 5\' 2"  (1.575 m)   Wt 41.7 kg   SpO2 98%   BMI 16.81 kg/m   Physical Exam  Constitutional: She is oriented to person, place, and time. She appears well-developed and well-nourished. No distress.  HENT:  Head: Normocephalic and atraumatic.  Neck: Normal range of motion. Neck supple.  Cardiovascular: Normal rate and regular rhythm. Exam reveals no gallop and no friction rub.  No murmur heard. Pulmonary/Chest: Effort normal and breath sounds normal. No respiratory distress. She has no wheezes.  Abdominal: Soft. Bowel sounds are normal. She exhibits no distension. There is no tenderness.  Musculoskeletal: Normal range of motion.  She has good range of motion of both hips and legs.  There is no deformity or shortening.  She does have swelling and tenderness over the left shoulder in the area of the lateral deltoid.  There is no obvious deformity.  Sensation is intact throughout the entire hand.  She is able to flex, extend, and oppose all fingers.  Ulnar and radial pulses are easily palpable.  Neurological: She is alert and oriented to person, place, and time.  Skin: Skin is warm and dry. She is not diaphoretic.  Nursing note and vitals reviewed.    ED Treatments / Results  Labs (all labs ordered are listed, but only abnormal results are displayed) Labs Reviewed - No data to  display  EKG None  Radiology No results found.  Procedures Procedures (including critical care time)  Medications Ordered in ED Medications - No data to display   Initial Impression / Assessment and Plan / ED Course  I have reviewed the triage vital signs and the nursing notes.  Pertinent labs & imaging results that were available during my care of the patient were reviewed by me and considered in my medical decision making (see chart for details).  X-rays show a minimally displaced left humeral neck fracture and fracture of the greater tuberosity of the proximal humerus.  The arm is neurovascularly intact.  She will be placed in a sling and swath and is to follow-up with orthopedics.  Final Clinical Impressions(s) / ED Diagnoses   Final diagnoses:  None    ED Discharge Orders    None       Veryl Speak, MD 06/16/18 2338

## 2018-06-16 NOTE — Discharge Instructions (Addendum)
Wear sling and swath until followed up by orthopedics.  20 minutes every 2 hours for the next 2 days while awake.  Follow-up with orthopedic surgery in 1 week.  The contact information for Dr. Aline Brochure has been provided in this discharge summary for you to call and make these arrangements.

## 2018-06-16 NOTE — ED Triage Notes (Signed)
Pt brought in by [facility] for c/o fall; pt c/o left shoulder pain; no obvious deformity to left arm; pt has dementia; pt appeared to be trying to go to the bathroom when she fell

## 2018-06-17 ENCOUNTER — Inpatient Hospital Stay (HOSPITAL_COMMUNITY): Payer: Medicare Other

## 2018-06-17 ENCOUNTER — Inpatient Hospital Stay (HOSPITAL_COMMUNITY): Payer: Medicare Other | Attending: Internal Medicine

## 2018-06-17 VITALS — BP 125/68 | HR 80 | Temp 97.7°F | Resp 16

## 2018-06-17 DIAGNOSIS — D500 Iron deficiency anemia secondary to blood loss (chronic): Secondary | ICD-10-CM | POA: Insufficient documentation

## 2018-06-17 DIAGNOSIS — K922 Gastrointestinal hemorrhage, unspecified: Secondary | ICD-10-CM

## 2018-06-17 DIAGNOSIS — Q2733 Arteriovenous malformation of digestive system vessel: Secondary | ICD-10-CM | POA: Insufficient documentation

## 2018-06-17 DIAGNOSIS — K31811 Angiodysplasia of stomach and duodenum with bleeding: Secondary | ICD-10-CM

## 2018-06-17 DIAGNOSIS — D508 Other iron deficiency anemias: Secondary | ICD-10-CM

## 2018-06-17 DIAGNOSIS — D509 Iron deficiency anemia, unspecified: Secondary | ICD-10-CM

## 2018-06-17 LAB — FERRITIN: Ferritin: 27 ng/mL (ref 11–307)

## 2018-06-17 LAB — CBC WITH DIFFERENTIAL/PLATELET
BASOS ABS: 0 10*3/uL (ref 0.0–0.1)
BASOS PCT: 0 %
EOS PCT: 1 %
Eosinophils Absolute: 0.1 10*3/uL (ref 0.0–0.7)
HEMATOCRIT: 24.7 % — AB (ref 36.0–46.0)
Hemoglobin: 7.3 g/dL — ABNORMAL LOW (ref 12.0–15.0)
Lymphocytes Relative: 8 %
Lymphs Abs: 0.9 10*3/uL (ref 0.7–4.0)
MCH: 25.6 pg — ABNORMAL LOW (ref 26.0–34.0)
MCHC: 29.6 g/dL — ABNORMAL LOW (ref 30.0–36.0)
MCV: 86.7 fL (ref 78.0–100.0)
MONO ABS: 0.8 10*3/uL (ref 0.1–1.0)
Monocytes Relative: 7 %
NEUTROS ABS: 8.8 10*3/uL — AB (ref 1.7–7.7)
Neutrophils Relative %: 84 %
PLATELETS: 211 10*3/uL (ref 150–400)
RBC: 2.85 MIL/uL — ABNORMAL LOW (ref 3.87–5.11)
RDW: 16.4 % — AB (ref 11.5–15.5)
WBC: 10.6 10*3/uL — ABNORMAL HIGH (ref 4.0–10.5)

## 2018-06-17 LAB — COMPREHENSIVE METABOLIC PANEL
ALT: 22 U/L (ref 0–44)
AST: 26 U/L (ref 15–41)
Albumin: 3.3 g/dL — ABNORMAL LOW (ref 3.5–5.0)
Alkaline Phosphatase: 85 U/L (ref 38–126)
Anion gap: 12 (ref 5–15)
BUN: 18 mg/dL (ref 8–23)
CHLORIDE: 103 mmol/L (ref 98–111)
CO2: 24 mmol/L (ref 22–32)
Calcium: 8 mg/dL — ABNORMAL LOW (ref 8.9–10.3)
Creatinine, Ser: 0.92 mg/dL (ref 0.44–1.00)
GFR calc Af Amer: 58 mL/min — ABNORMAL LOW (ref >60)
GFR, EST NON AFRICAN AMERICAN: 50 mL/min — AB (ref >60)
Glucose, Bld: 267 mg/dL — ABNORMAL HIGH (ref 70–99)
POTASSIUM: 3.5 mmol/L (ref 3.5–5.1)
SODIUM: 139 mmol/L (ref 135–145)
Total Bilirubin: 0.6 mg/dL (ref 0.3–1.2)
Total Protein: 6.3 g/dL — ABNORMAL LOW (ref 6.5–8.1)

## 2018-06-17 LAB — LACTATE DEHYDROGENASE: LDH: 181 U/L (ref 98–192)

## 2018-06-17 MED ORDER — SODIUM CHLORIDE 0.9 % IV SOLN
510.0000 mg | Freq: Once | INTRAVENOUS | Status: AC
  Administered 2018-06-17: 510 mg via INTRAVENOUS
  Filled 2018-06-17: qty 17

## 2018-06-17 MED ORDER — SODIUM CHLORIDE 0.9 % IV SOLN
Freq: Once | INTRAVENOUS | Status: AC
  Administered 2018-06-17: 14:00:00 via INTRAVENOUS

## 2018-06-17 NOTE — Patient Instructions (Signed)
[facility]  Discharge Instructions:  Today you received an iron infusion. Follow up as scheduled. Call clinic for any questions or concerns.  _______________________________________________________________  Thank you for choosing [facility] at [facility] to provide your oncology and hematology care.  To afford each patient quality time with our providers, please arrive at least 15 minutes before your scheduled appointment.  You need to re-schedule your appointment if you arrive 10 or more minutes late.  We strive to give you quality time with our providers, and arriving late affects you and other patients whose appointments are after yours.  Also, if you no show three or more times for appointments you may be dismissed from the clinic.  Again, thank you for choosing [facility] at [facility]. Our hope is that these requests will allow you access to exceptional care and in a timely manner. _______________________________________________________________  If you have questions after your visit, please contact our office at [phone] between the hours of 8:30 a.m. and 5:00 p.m. Voicemails left after 4:30 p.m. will not be returned until the following business day. _______________________________________________________________  For prescription refill requests, have your pharmacy contact our office. _______________________________________________________________  Recommendations made by the consultant and any test results will be sent to your referring physician. _______________________________________________________________ 

## 2018-06-17 NOTE — Progress Notes (Signed)
Kara Santos tolerated Feraheme infusion without incident or complaint. VSS upon completion of treatment. Labs reviewed, and hgb 7.3 reviewed with Dr. Walden Field who wants patient to come back next week for lab work to follow up on hgb. Pt discharged via wheelchair in satisfactory condition in presence of daughter.

## 2018-06-23 ENCOUNTER — Ambulatory Visit (INDEPENDENT_AMBULATORY_CARE_PROVIDER_SITE_OTHER): Payer: Medicare Other | Admitting: Orthopaedic Surgery

## 2018-06-23 ENCOUNTER — Other Ambulatory Visit (HOSPITAL_COMMUNITY): Payer: Self-pay | Admitting: *Deleted

## 2018-06-23 ENCOUNTER — Encounter: Payer: Self-pay | Admitting: Orthopaedic Surgery

## 2018-06-23 VITALS — BP 111/68 | HR 111 | Ht 62.0 in | Wt 91.0 lb

## 2018-06-23 DIAGNOSIS — D500 Iron deficiency anemia secondary to blood loss (chronic): Secondary | ICD-10-CM

## 2018-06-23 DIAGNOSIS — S42295A Other nondisplaced fracture of upper end of left humerus, initial encounter for closed fracture: Secondary | ICD-10-CM

## 2018-06-23 NOTE — Progress Notes (Signed)
Subjective:    Patient ID: Kara Santos, female    DOB: [birth year], 84 y.o.   MRN: [id number]  HPI She is a resident at a local rest home. She fell and hurt her left shoulder 06-16-18. She was seen in the ER.  X-rays showed a fracture of the left proximal humerus.  She had no other injury.  She was placed in a shoulder immobilizer.  Pain has been controlled.  I have reviewed the x-rays, x-ray report and ER record.  I have completed forms for the rest home today.   Review of Systems  Constitutional: Positive for activity change.  Musculoskeletal: Positive for arthralgias and joint swelling.  Psychiatric/Behavioral:       Chronic confusion.  All other systems reviewed and are negative.  For Review of Systems, all other systems reviewed and are negative.  The following is a summary of the past history medically, past history surgically, known current medicines, social history and family history.  This information is gathered electronically by the computer from prior information and documentation.  I review this each visit and have found including this information at this point in the chart is beneficial and informative.   Past Medical History:  Diagnosis Date  . Alzheimer's disease, early onset 69  . Anemia 11/07/2011   chronic IDA requiring multiple transfusions  . AVM (arteriovenous malformation) of stomach, acquired with hemorrhage 12/04/2013  . Closed fracture of greater trochanter of femur ([place]) 02/2013   left  . Depression   . Diabetes mellitus   . Fall at home 11/17/14   multiple rib fractures  . GERD (gastroesophageal reflux disease)   . Hypothyroidism    s/p thyroid surgery for thyroid cancer  . Iron deficiency anemia 05/05/2012    Past Surgical History:  Procedure Laterality Date  . APPENDECTOMY    . bilateral cataract extractions    . BREAST REDUCTION SURGERY    . CHOLECYSTECTOMY    . COLONOSCOPY  08/2006   Dr. Cecelia Byars sided diverticulosis  . double balloon  enteroscopy  06/1008   [facility]-->APC of 12 SB AVMs in jejunum. 6 feet of jejunum inspected  . ESOPHAGOGASTRODUODENOSCOPY  08/2006   Dr. Trevor [facility], large duodenal diverticulum  . Givens small bowel capsule  08/2006   few gastric erosions, numerous small bowel AVMs with SB erosions  . right shoulder surgery    . THYROID SURGERY     thyroid cancer    Current Outpatient Medications on File Prior to Visit  Medication Sig Dispense Refill  . acetaminophen (TYLENOL) 500 MG tablet Take 1,000 mg by mouth 3 (three) times daily.    . benzocaine-resorcinol (VAGISIL) 5-2 % vaginal cream Place 2 g vaginally daily as needed for itching or irritation.     . cyanocobalamin 100 MCG tablet Take 100 mcg by mouth daily.    . ferrous sulfate 325 (65 FE) MG tablet Take 325 mg by mouth daily with breakfast.    . furosemide (LASIX) 40 MG tablet Take 40 mg by mouth daily.     [name] Kitchen guaiFENesin-dextromethorphan (ROBITUSSIN DM) 100-10 MG/5ML syrup Take 10 mLs by mouth every 6 (six) hours as needed for cough.    . levothyroxine (SYNTHROID, LEVOTHROID) 25 MCG tablet Take 25 mcg by mouth daily before breakfast.     . loperamide (IMODIUM A-D) 2 MG tablet Take 2 mg by mouth every 2 (two) hours as needed for diarrhea or loose stools.    . meclizine (ANTIVERT) 25 MG tablet Take 25 mg  by mouth 3 (three) times daily.    . metFORMIN (GLUCOPHAGE-XR) 500 MG 24 hr tablet Take 500 mg by mouth daily with breakfast.     . Multiple Vitamin (MULTIVITAMIN) tablet Take 1 tablet by mouth daily.    [name] Kitchen nystatin-triamcinolone (MYCOLOG II) cream Apply 1 application topically 2 (two) times daily. For redness of corners    . ondansetron (ZOFRAN) 4 MG tablet Take 4 mg by mouth 4 (four) times daily as needed for nausea or vomiting.    . pantoprazole (PROTONIX) 40 MG tablet Take 40 mg by mouth daily.    . potassium chloride SA (K-DUR,KLOR-CON) 20 MEQ tablet Take 20 mEq by mouth daily.     . sertraline (ZOLOFT) 100 MG tablet Take 1 tablet by mouth  daily.    . traMADol (ULTRAM) 50 MG tablet Take 50 mg by mouth every 6 (six) hours as needed for moderate pain. Pain not relieved by Tylenol.     No current facility-administered medications on file prior to visit.     Social History   Socioeconomic History  . Marital status: Widowed    Spouse name: Not on file  . Number of children: Not on file  . Years of education: Not on file  . Highest education level: Not on file  Occupational History  . Not on file  Social Needs  . Financial resource strain: Not on file  . Food insecurity:    Worry: Not on file    Inability: Not on file  . Transportation needs:    Medical: Not on file    Non-medical: Not on file  Tobacco Use  . Smoking status: Never Smoker  . Smokeless tobacco: Never Used  Substance and Sexual Activity  . Alcohol use: No  . Drug use: No  . Sexual activity: Not Currently  Lifestyle  . Physical activity:    Days per week: Not on file    Minutes per session: Not on file  . Stress: Not on file  Relationships  . Social connections:    Talks on phone: Not on file    Gets together: Not on file    Attends religious service: Not on file    Active member of club or organization: Not on file    Attends meetings of clubs or organizations: Not on file    Relationship status: Not on file  . Intimate partner violence:    Fear of current or ex partner: Not on file    Emotionally abused: Not on file    Physically abused: Not on file    Forced sexual activity: Not on file  Other Topics Concern  . Not on file  Social History Narrative  . Not on file    Family History  Problem Relation Age of Onset  . Cancer Mother   . Cancer Father   . Diabetes Father   . Diabetes Sister   . Cancer Brother     BP 111/68   Pulse (!) 111   Ht 5\' 2"  (1.575 m)   Wt 91 lb (41.3 kg)   BMI 16.64 kg/m   Body mass index is 16.64 kg/m.      Objective:   Physical Exam  Constitutional: She is oriented to person, place, and time.  She appears well-developed and well-nourished.  HENT:  Head: Normocephalic and atraumatic.  Eyes: Pupils are equal, round, and reactive to light. Conjunctivae and EOM are normal.  Neck: Normal range of motion. Neck supple.  Cardiovascular: Normal rate, regular  rhythm and intact distal pulses.  Pulmonary/Chest: Effort normal.  Abdominal: Soft.  Musculoskeletal:       Left shoulder: She exhibits decreased range of motion and tenderness.       Arms: Neurological: She is alert and oriented to person, place, and time. She has normal reflexes. She displays normal reflexes. No cranial nerve deficit. She exhibits normal muscle tone. Coordination normal.  Skin: Skin is warm and dry.  Psychiatric: She has a normal mood and affect. Her behavior is normal. Judgment and thought content normal.          Assessment & Plan:   Encounter Diagnosis  Name Primary?  . Other closed nondisplaced fracture of proximal end of left humerus, initial encounter Yes   Continue the sling and swathe.  Return in two weeks.  X-rays of the left shoulder then.  Call if any problem.  Precautions discussed.   Electronically Signed Sanjuana Kava, MD 9/18/20199:41 AM

## 2018-06-24 ENCOUNTER — Inpatient Hospital Stay (HOSPITAL_COMMUNITY): Payer: Medicare Other

## 2018-06-24 DIAGNOSIS — D500 Iron deficiency anemia secondary to blood loss (chronic): Secondary | ICD-10-CM | POA: Diagnosis not present

## 2018-06-24 DIAGNOSIS — Q2733 Arteriovenous malformation of digestive system vessel: Secondary | ICD-10-CM | POA: Diagnosis not present

## 2018-06-24 LAB — COMPREHENSIVE METABOLIC PANEL
ALT: 24 U/L (ref 0–44)
AST: 43 U/L — ABNORMAL HIGH (ref 15–41)
Albumin: 3.2 g/dL — ABNORMAL LOW (ref 3.5–5.0)
Alkaline Phosphatase: 105 U/L (ref 38–126)
Anion gap: 12 (ref 5–15)
BUN: 22 mg/dL (ref 8–23)
CO2: 24 mmol/L (ref 22–32)
Calcium: 7.3 mg/dL — ABNORMAL LOW (ref 8.9–10.3)
Chloride: 98 mmol/L (ref 98–111)
Creatinine, Ser: 1.04 mg/dL — ABNORMAL HIGH (ref 0.44–1.00)
GFR calc Af Amer: 50 mL/min — ABNORMAL LOW (ref >60)
GFR calc non Af Amer: 43 mL/min — ABNORMAL LOW (ref >60)
Glucose, Bld: 216 mg/dL — ABNORMAL HIGH (ref 70–99)
Potassium: 3.7 mmol/L (ref 3.5–5.1)
Sodium: 134 mmol/L — ABNORMAL LOW (ref 135–145)
Total Bilirubin: 0.9 mg/dL (ref 0.3–1.2)
Total Protein: 6.5 g/dL (ref 6.5–8.1)

## 2018-06-24 LAB — CBC WITH DIFFERENTIAL/PLATELET
BASOS ABS: 0 10*3/uL (ref 0.0–0.1)
BASOS PCT: 0 %
EOS ABS: 0 10*3/uL (ref 0.0–0.7)
Eosinophils Relative: 0 %
HCT: 28.2 % — ABNORMAL LOW (ref 36.0–46.0)
Hemoglobin: 8.2 g/dL — ABNORMAL LOW (ref 12.0–15.0)
Lymphocytes Relative: 5 %
Lymphs Abs: 0.8 10*3/uL (ref 0.7–4.0)
MCH: 26.1 pg (ref 26.0–34.0)
MCHC: 29.1 g/dL — ABNORMAL LOW (ref 30.0–36.0)
MCV: 89.8 fL (ref 78.0–100.0)
Monocytes Absolute: 0.7 10*3/uL (ref 0.1–1.0)
Monocytes Relative: 5 %
NEUTROS PCT: 90 %
Neutro Abs: 13.6 10*3/uL (ref 1.7–7.7)
PLATELETS: 201 10*3/uL (ref 150–400)
RBC: 3.14 MIL/uL — AB (ref 3.87–5.11)
RDW: 19.5 % — AB (ref 11.5–15.5)
WBC: 15.1 10*3/uL — AB (ref 4.0–10.5)

## 2018-06-27 DIAGNOSIS — Z23 Encounter for immunization: Secondary | ICD-10-CM | POA: Diagnosis not present

## 2018-06-29 ENCOUNTER — Encounter: Payer: Self-pay | Admitting: Obstetrics & Gynecology

## 2018-06-29 ENCOUNTER — Ambulatory Visit (INDEPENDENT_AMBULATORY_CARE_PROVIDER_SITE_OTHER): Payer: Medicare Other | Admitting: Obstetrics & Gynecology

## 2018-06-29 ENCOUNTER — Other Ambulatory Visit: Payer: Self-pay

## 2018-06-29 VITALS — BP 108/74 | HR 108 | Ht 62.0 in | Wt 91.0 lb

## 2018-06-29 DIAGNOSIS — N950 Postmenopausal bleeding: Secondary | ICD-10-CM | POA: Diagnosis not present

## 2018-06-29 DIAGNOSIS — R9389 Abnormal findings on diagnostic imaging of other specified body structures: Secondary | ICD-10-CM

## 2018-06-29 MED ORDER — MEDROXYPROGESTERONE ACETATE 2.5 MG PO TABS: 2.5000 mg | ORAL_TABLET | Freq: Every day | ORAL | 11 refills | Status: DC

## 2018-06-29 NOTE — Progress Notes (Signed)
Patient ID: Kara Santos, female   DOB: 1917-09-25, 84 y.o.   MRN: [id number] Follow up appointment for results  Chief Complaint  Patient presents with  . endometrial biopsy    Blood pressure 108/74, pulse (!) 108, height 5\' 2"  (1.575 m), weight 91 lb (41.3 kg).  Bleeding has been minimal since her visit GYNECOLOGIC SONOGRAM   Kara Santos is a 84 y.o. G1P1 she is here for a pelvic sonogram for postmenopausal bleeding.  Uterus                      5.3 x 2.7 x 3.5 cm, vol 26 ml,atropic heterogeneous anteverted uterus w/mult calcification  Endometrium          6.3 mm, symmetrical,thickened endometrium w/color flow 6.3 mm,limited view of endometrium  Right ovary            Right ovary not visualized,adnexa wnl  Left ovary               Left ovary not visualized,adnexa wnl  No free fluid   Technician Comments:  PELVIC US TA/TV:atropic heterogeneous anteverted uterus w/mult calcification,thickened endometrium w/color flow 6.3 mm,limited view of endometrium,ovaries not visualized,bilat adnexa's wnl,no free fluid,limited ultrasound    [employer] 05/25/2018 4:05 PM Clinical Impression and recommendations:  I have reviewed the sonogram results above.  Combined with the patient's current clinical course, below are my impressions and any appropriate recommendations for management based on the sonographic findings:  1. Tiny atrophic uterus, with  Calcific fibroids tiny, less than 1 cm , with Endometrium atrophic in several views, less than 2 mm. 2 Question of endometrial thickness on image 44, : I favor sub mucous myoma due to similarity of tissue texture to adjacent myometrium. 3 Adnexa unremarkable 4. Would consider treating for atrophic vaginal tissues; reassess if bleeding persists after topical therapy.  Jonnie Kind   MEDS ordered this encounter: Meds ordered this encounter  Medications  . medroxyPROGESTERone (PROVERA) 2.5 MG tablet    Sig: Take 1  tablet (2.5 mg total) by mouth daily.    Dispense:  30 tablet    Refill:  11    Orders for this encounter: No orders of the defined types were placed in this encounter.   Impression:  PMB (postmenopausal bleeding)  Thickened endometrium   Plan: Due to the age of the patient I will not perform EMB because it will not lead to more definitive surgical intervention  For hygeine issues I will placed on provera 2.5 mg daily to suppress/stabilize the endometrium   Meds ordered this encounter  Medications  . medroxyPROGESTERone (PROVERA) 2.5 MG tablet    Sig: Take 1 tablet (2.5 mg total) by mouth daily.    Dispense:  30 tablet    Refill:  11    Follow Up: Return if symptoms worsen or fail to improve.       Face to face time:  10 minutes  Greater than 50% of the visit time was spent in counseling and coordination of care with the patient.  The summary and outline of the counseling and care coordination is summarized in the note above.   All questions were answered.  Past Medical History:  Diagnosis Date  . Alzheimer's disease, early onset 36  . Anemia 11/07/2011   chronic IDA requiring multiple transfusions  . AVM (arteriovenous malformation) of stomach, acquired with hemorrhage 12/04/2013  . Closed fracture of greater trochanter of femur ([place]) 02/2013  left  . Depression   . Diabetes mellitus   . Fall at home 11/17/14   multiple rib fractures  . GERD (gastroesophageal reflux disease)   . Hypothyroidism    s/p thyroid surgery for thyroid cancer  . Iron deficiency anemia 05/05/2012    Past Surgical History:  Procedure Laterality Date  . APPENDECTOMY    . bilateral cataract extractions    . BREAST REDUCTION SURGERY    . CHOLECYSTECTOMY    . COLONOSCOPY  08/2006   Dr. Cecelia Byars sided diverticulosis  . double balloon enteroscopy  06/1008   [facility]-->APC of 12 SB AVMs in jejunum. 6 feet of jejunum inspected  . ESOPHAGOGASTRODUODENOSCOPY  08/2006   Dr.  Trevor Iha [facility], large duodenal diverticulum  . Givens small bowel capsule  08/2006   few gastric erosions, numerous small bowel AVMs with SB erosions  . right shoulder surgery    . THYROID SURGERY     thyroid cancer    OB History    Gravida  1   Para  1   Term      Preterm      AB      Living  1     SAB      TAB      Ectopic      Multiple      Live Births              Allergies  Allergen Reactions  . Asa [Aspirin]     Acid reflux     Social History   Socioeconomic History  . Marital status: Widowed    Spouse name: Not on file  . Number of children: Not on file  . Years of education: Not on file  . Highest education level: Not on file  Occupational History  . Not on file  Social Needs  . Financial resource strain: Not on file  . Food insecurity:    Worry: Not on file    Inability: Not on file  . Transportation needs:    Medical: Not on file    Non-medical: Not on file  Tobacco Use  . Smoking status: Never Smoker  . Smokeless tobacco: Never Used  Substance and Sexual Activity  . Alcohol use: No  . Drug use: No  . Sexual activity: Not Currently  Lifestyle  . Physical activity:    Days per week: Not on file    Minutes per session: Not on file  . Stress: Not on file  Relationships  . Social connections:    Talks on phone: Not on file    Gets together: Not on file    Attends religious service: Not on file    Active member of club or organization: Not on file    Attends meetings of clubs or organizations: Not on file    Relationship status: Not on file  Other Topics Concern  . Not on file  Social History Narrative  . Not on file    Family History  Problem Relation Age of Onset  . Cancer Mother   . Cancer Father   . Diabetes Father   . Diabetes Sister   . Cancer Brother

## 2018-07-08 ENCOUNTER — Ambulatory Visit (INDEPENDENT_AMBULATORY_CARE_PROVIDER_SITE_OTHER): Payer: Medicare Other

## 2018-07-08 ENCOUNTER — Emergency Department (HOSPITAL_COMMUNITY): Payer: Medicare Other

## 2018-07-08 ENCOUNTER — Ambulatory Visit (INDEPENDENT_AMBULATORY_CARE_PROVIDER_SITE_OTHER): Payer: Medicare Other | Admitting: Orthopaedic Surgery

## 2018-07-08 ENCOUNTER — Encounter: Payer: Self-pay | Admitting: Orthopaedic Surgery

## 2018-07-08 ENCOUNTER — Encounter (HOSPITAL_COMMUNITY): Payer: Self-pay

## 2018-07-08 ENCOUNTER — Emergency Department (HOSPITAL_COMMUNITY)
Admission: EM | Admit: 2018-07-08 | Discharge: 2018-07-08 | Disposition: A | Discharge status: Home / Self Care | Payer: Medicare Other | Attending: Emergency Medicine | Admitting: Emergency Medicine

## 2018-07-08 ENCOUNTER — Other Ambulatory Visit: Payer: Self-pay

## 2018-07-08 DIAGNOSIS — W050XXA Fall from non-moving wheelchair, initial encounter: Secondary | ICD-10-CM | POA: Diagnosis not present

## 2018-07-08 DIAGNOSIS — Y92002 Bathroom of unspecified non-institutional (private) residence single-family (private) house as the place of occurrence of the external cause: Secondary | ICD-10-CM | POA: Insufficient documentation

## 2018-07-08 DIAGNOSIS — S99912A Unspecified injury of left ankle, initial encounter: Secondary | ICD-10-CM | POA: Diagnosis not present

## 2018-07-08 DIAGNOSIS — M25511 Pain in right shoulder: Secondary | ICD-10-CM | POA: Insufficient documentation

## 2018-07-08 DIAGNOSIS — G300 Alzheimer's disease with early onset: Secondary | ICD-10-CM | POA: Diagnosis not present

## 2018-07-08 DIAGNOSIS — S42295D Other nondisplaced fracture of upper end of left humerus, subsequent encounter for fracture with routine healing: Secondary | ICD-10-CM

## 2018-07-08 DIAGNOSIS — W19XXXA Unspecified fall, initial encounter: Secondary | ICD-10-CM

## 2018-07-08 DIAGNOSIS — S161XXA Strain of muscle, fascia and tendon at neck level, initial encounter: Secondary | ICD-10-CM | POA: Diagnosis not present

## 2018-07-08 DIAGNOSIS — Y93E8 Activity, other personal hygiene: Secondary | ICD-10-CM | POA: Insufficient documentation

## 2018-07-08 DIAGNOSIS — S0101XA Laceration without foreign body of scalp, initial encounter: Secondary | ICD-10-CM | POA: Insufficient documentation

## 2018-07-08 DIAGNOSIS — E119 Type 2 diabetes mellitus without complications: Secondary | ICD-10-CM | POA: Insufficient documentation

## 2018-07-08 DIAGNOSIS — R58 Hemorrhage, not elsewhere classified: Secondary | ICD-10-CM | POA: Diagnosis not present

## 2018-07-08 DIAGNOSIS — M542 Cervicalgia: Secondary | ICD-10-CM | POA: Diagnosis not present

## 2018-07-08 DIAGNOSIS — Y999 Unspecified external cause status: Secondary | ICD-10-CM | POA: Diagnosis not present

## 2018-07-08 DIAGNOSIS — R404 Transient alteration of awareness: Secondary | ICD-10-CM | POA: Diagnosis not present

## 2018-07-08 DIAGNOSIS — E039 Hypothyroidism, unspecified: Secondary | ICD-10-CM | POA: Insufficient documentation

## 2018-07-08 DIAGNOSIS — M25572 Pain in left ankle and joints of left foot: Secondary | ICD-10-CM | POA: Diagnosis not present

## 2018-07-08 DIAGNOSIS — S3993XA Unspecified injury of pelvis, initial encounter: Secondary | ICD-10-CM | POA: Diagnosis not present

## 2018-07-08 DIAGNOSIS — F0280 Dementia in other diseases classified elsewhere without behavioral disturbance: Secondary | ICD-10-CM | POA: Diagnosis not present

## 2018-07-08 DIAGNOSIS — M25551 Pain in right hip: Secondary | ICD-10-CM | POA: Diagnosis not present

## 2018-07-08 DIAGNOSIS — S0990XA Unspecified injury of head, initial encounter: Secondary | ICD-10-CM

## 2018-07-08 DIAGNOSIS — S199XXA Unspecified injury of neck, initial encounter: Secondary | ICD-10-CM | POA: Diagnosis not present

## 2018-07-08 DIAGNOSIS — S4991XA Unspecified injury of right shoulder and upper arm, initial encounter: Secondary | ICD-10-CM | POA: Diagnosis not present

## 2018-07-08 DIAGNOSIS — R52 Pain, unspecified: Secondary | ICD-10-CM | POA: Diagnosis not present

## 2018-07-08 MED ORDER — LIDOCAINE-EPINEPHRINE (PF) 1 %-1:200000 IJ SOLN
10.0000 mL | Freq: Once | INTRAMUSCULAR | Status: DC
  Filled 2018-07-08: qty 30

## 2018-07-08 NOTE — ED Triage Notes (Signed)
According to [facility] staff, pt fell trying to get out of wheelchair into another chair.  Reports has abrasion/laceration to back of head, c/o neck pain, and r hip pain.  Reports pt has fractured left humerus from a previous fall.  PT has dementia.

## 2018-07-08 NOTE — ED Provider Notes (Signed)
Emergency Department Provider Note   I have reviewed the triage vital signs and the nursing notes.   HISTORY  Chief Complaint Fall   HPI Kara Santos is a 84 y.o. female with PMH of dementia, prior left great trochanteric fracture, known left humeral fracture from prior fall, GERD, DM, and anemia from GI AVMs [place] to the emergency department for evaluation after fall.  Patient is at a nursing facility, [place].  Patient was transferring to a chair from her wheelchair when she fell.  Patient does not remember the fall.  She is complaining of headache, neck pain, right shoulder pain, and left elbow pain.  She told the nurse on arrival that she was having pain in the right hip but denies this to me.   Level 5 caveat: Dementia.    Past Medical History:  Diagnosis Date  . Alzheimer's disease, early onset ([place]) 2015  . Anemia 11/07/2011   chronic IDA requiring multiple transfusions  . AVM (arteriovenous malformation) of stomach, acquired with hemorrhage 12/04/2013  . Closed fracture of greater trochanter of femur ([place]) 02/2013   left  . Depression   . Diabetes mellitus   . Fall at home 11/17/14   multiple rib fractures  . GERD (gastroesophageal reflux disease)   . Hypothyroidism    s/p thyroid surgery for thyroid cancer  . Iron deficiency anemia 05/05/2012    Patient Active Problem List   Diagnosis Date Noted  . Iron deficiency anemia due to chronic blood loss 05/18/2018  . Vaginal atrophy 05/18/2018  . PMB (postmenopausal bleeding) 05/18/2018  . Dementia without behavioral disturbance ([place]) 05/18/2018  . Protein-calorie malnutrition, severe 12/25/2017  . Dehydration   . Goals of care, counseling/discussion   . Palliative care by specialist   . Encounter for hospice care discussion   . AKI (acute kidney injury) ([place]) 12/23/2017  . GI bleed 08/07/2015  . Mandibular fracture, closed ([place]) 08/07/2015  . Acute encephalopathy 08/07/2015  . Hematochezia 08/07/2015  . Fall  11/20/2014  . Multiple rib fractures 11/17/2014  . AVM (arteriovenous malformation) of stomach, acquired with hemorrhage 12/04/2013  . Closed fracture of greater trochanter of femur ([place]) 02/06/2013  . History of fall 02/06/2013  . Iron deficiency anemia 05/05/2012  . Chronic gastrointestinal bleeding 03/04/2012  . Diabetes mellitus type 2 in nonobese ([place]) 03/04/2012  . Hypothyroidism 03/04/2012  . History of thyroid cancer 03/04/2012    Past Surgical History:  Procedure Laterality Date  . APPENDECTOMY    . bilateral cataract extractions    . BREAST REDUCTION SURGERY    . CHOLECYSTECTOMY    . COLONOSCOPY  08/2006   Dr. Cecelia Byars sided diverticulosis  . double balloon enteroscopy  06/1008   [facility]-->[facility]C of 12 SB AVMs in jejunum. 6 feet of jejunum inspected  . ESOPHAGOGASTRODUODENOSCOPY  08/2006   Dr. Trevor Iha [facility], large duodenal diverticulum  . [place] small bowel capsule  08/2006   few gastric erosions, numerous small bowel AVMs with SB erosions  . right shoulder surgery    . THYROID SURGERY     thyroid cancer   Allergies Asa [aspirin]  Family History  Problem Relation Age of Onset  . Cancer Mother   . Cancer Father   . Diabetes Father   . Diabetes Sister   . Cancer Brother     Social History Social History   Tobacco Use  . Smoking status: Never Smoker  . Smokeless tobacco: Never Used  Substance Use Topics  . Alcohol use: No  .  Drug use: No    Review of Systems  Level 5 caveat: Dementia   ____________________________________________   PHYSICAL EXAM:  VITAL SIGNS: ED Triage Vitals  Enc Vitals Group     BP 07/08/18 1329 114/73     Pulse Rate 07/08/18 1329 93     Resp 07/08/18 1329 18     Temp 07/08/18 1329 98.9 F (37.2 C)     Temp Source 07/08/18 1329 Oral     SpO2 07/08/18 1329 99 %     Weight 07/08/18 1328 90 lb 13.3 oz (41.2 kg)     Height 07/08/18 1328 5\' 2"  (1.575 m)   Constitutional: Alert but confused. Well appearing and in no  acute distress. Eyes: Conjunctivae are normal. PERRL.  Head: 4 cm laceration over the right parietal scalp. Wound is hemostatic. No galea involvement.  Nose: No congestion/rhinnorhea. Mouth/Throat: Mucous membranes are moist.  Neck: No stridor.  No cervical spine tenderness to palpation. Cardiovascular: Normal rate, regular rhythm. Good peripheral circulation. Grossly normal heart sounds.   Respiratory: Normal respiratory effort.  No retractions. Lungs CTAB. Gastrointestinal: Soft and nontender. No distention.  Musculoskeletal: Left arm in sling with known left humerus fracture. Mild pain with ROM of the right shoulder. Patient with limited ROM of bilateral hips and tenderness in the left ankle.  Neurologic:  Normal speech and language. No gross focal neurologic deficits are appreciated.  Skin:  Skin is warm, dry and intact. No rash noted.  ____________________________________________  RADIOLOGY  Dg Pelvis 1-2 Views  Result Date: 07/08/2018 CLINICAL DATA:  [place] today. Dementia. Unable to give history. EXAM: PELVIS - 1-2 VIEW COMPARISON:  Abdomen and pelvis CT dated 08/07/2015. FINDINGS: Diffuse osteopenia. No fracture or dislocation seen. IMPRESSION: No fracture or dislocation. Electronically Signed   By: Claudie Revering M.D.   On: 07/08/2018 14:12   Dg Shoulder Right  Result Date: 07/08/2018 CLINICAL DATA:  Right shoulder pain following a fall today. EXAM: RIGHT SHOULDER - 2+ VIEW COMPARISON:  11/17/2014. FINDINGS: Rotator cuff calcifications. Old right 8th rib fracture. No acute fracture or dislocation seen. IMPRESSION: 1. No acute fracture or dislocation. 2. Changes of rotator cuff calcific tendinosis. Electronically Signed   By: Claudie Revering M.D.   On: 07/08/2018 14:11   Dg Ankle Complete Left  Result Date: 07/08/2018 CLINICAL DATA:  [place] today. Dementia. Unable to give history. EXAM: LEFT ANKLE COMPLETE - 3+ VIEW COMPARISON:  08/04/2015. FINDINGS: Mild to moderate inferior calcaneal spur  formation. Mild talotibial spur formation. No fracture, dislocation or effusion seen. Arterial calcifications. IMPRESSION: 1. No fracture or effusion. 2. Degenerative changes. Electronically Signed   By: Claudie Revering M.D.   On: 07/08/2018 14:13   Ct Head Wo Contrast  Result Date: 07/08/2018 CLINICAL DATA:  Fall from wheelchair EXAM: CT HEAD WITHOUT CONTRAST CT CERVICAL SPINE WITHOUT CONTRAST TECHNIQUE: Multidetector CT imaging of the head and cervical spine was performed following the standard protocol without intravenous contrast. Multiplanar CT image reconstructions of the cervical spine were also generated. COMPARISON:  05/07/2018, 08/04/2015 FINDINGS: CT HEAD FINDINGS Brain: Diffuse atrophic changes and chronic white matter ischemic changes are seen similar to that noted on the prior exam. No acute hemorrhage, acute infarction or space-occupying mass lesion is noted. Vascular: No hyperdense vessel or unexpected calcification. Skull: Normal. Negative for fracture or focal lesion. Sinuses/Orbits: No acute finding. Other: Scalp laceration is noted in the right posterior parietal region. No scalp hematoma is noted. CT CERVICAL SPINE FINDINGS Alignment: Stable anterolisthesis of C4 on C5  is noted. The C5 vertebral body is somewhat retrolisthesis with respect to C6. Skull base and vertebrae: 7 cervical segments are well visualized. Multilevel disc space narrowing is noted from C3-C7 with associated facet hypertrophic changes and osteophytic changes. The overall appearance is stable from the prior exam. No acute fracture or acute facet abnormality is noted. Soft tissues and spinal canal: Diffuse ligamentous calcifications are noted particularly the posterior longitudinal ligament. Surrounding soft tissues show diffuse vascular calcifications. No hematoma or other abnormality is seen. Upper chest: Within normal limits. Other: Chronic changes involving the right mandibular condyle and neck are seen. IMPRESSION: CT of  the head: Scalp laceration in the right posterior parietal region. Chronic atrophic and ischemic changes without acute abnormality. CT of the cervical spine: Multilevel degenerative changes which are stable in appearance from previous exams. No acute fracture or acute facet abnormality is noted. Electronically Signed   By: Inez Catalina M.D.   On: 07/08/2018 14:38   Ct Cervical Spine Wo Contrast  Result Date: 07/08/2018 CLINICAL DATA:  Fall from wheelchair EXAM: CT HEAD WITHOUT CONTRAST CT CERVICAL SPINE WITHOUT CONTRAST TECHNIQUE: Multidetector CT imaging of the head and cervical spine was performed following the standard protocol without intravenous contrast. Multiplanar CT image reconstructions of the cervical spine were also generated. COMPARISON:  05/07/2018, 08/04/2015 FINDINGS: CT HEAD FINDINGS Brain: Diffuse atrophic changes and chronic white matter ischemic changes are seen similar to that noted on the prior exam. No acute hemorrhage, acute infarction or space-occupying mass lesion is noted. Vascular: No hyperdense vessel or unexpected calcification. Skull: Normal. Negative for fracture or focal lesion. Sinuses/Orbits: No acute finding. Other: Scalp laceration is noted in the right posterior parietal region. No scalp hematoma is noted. CT CERVICAL SPINE FINDINGS Alignment: Stable anterolisthesis of C4 on C5 is noted. The C5 vertebral body is somewhat retrolisthesis with respect to C6. Skull base and vertebrae: 7 cervical segments are well visualized. Multilevel disc space narrowing is noted from C3-C7 with associated facet hypertrophic changes and osteophytic changes. The overall appearance is stable from the prior exam. No acute fracture or acute facet abnormality is noted. Soft tissues and spinal canal: Diffuse ligamentous calcifications are noted particularly the posterior longitudinal ligament. Surrounding soft tissues show diffuse vascular calcifications. No hematoma or other abnormality is seen.  Upper chest: Within normal limits. Other: Chronic changes involving the right mandibular condyle and neck are seen. IMPRESSION: CT of the head: Scalp laceration in the right posterior parietal region. Chronic atrophic and ischemic changes without acute abnormality. CT of the cervical spine: Multilevel degenerative changes which are stable in appearance from previous exams. No acute fracture or acute facet abnormality is noted. Electronically Signed   By: Inez Catalina M.D.   On: 07/08/2018 14:38   Dg Shoulder Left  Result Date: 07/08/2018 Clinical:  History of fracture of the left shoulder X-rays were done of the left shoulder, two views. There is an impacted fracture of the humeral head on the left.  Humeral head is located in the glenoid area.  Bone quality is good. Impression:  Healing impacted fracture of the left humerus proximally. Electronically Signed Sanjuana Kava, MD 10/3/20199:22 AM    ____________________________________________   PROCEDURES  Procedure(s) performed:   [name] KitchenMarland KitchenLaceration Repair Date/Time: 07/08/2018 3:11 PM Performed by: Margette Fast, MD Authorized by: Margette Fast, MD   Consent:    Consent obtained:  Verbal   Consent given by:  Guardian   Risks discussed:  Infection, need for additional repair, nerve damage, pain,  poor cosmetic result, poor wound healing, retained foreign body, tendon damage and vascular damage   Alternatives discussed:  No treatment Anesthesia (see MAR for exact dosages):    Anesthesia method:  Local infiltration   Local anesthetic:  Lidocaine 1% WITH epi Laceration details:    Location:  Scalp   Scalp location:  R parietal   Length (cm):  4 Repair type:    Repair type:  Intermediate Pre-procedure details:    Preparation:  Imaging obtained to evaluate for foreign bodies and patient was prepped and draped in usual sterile fashion Exploration:    Hemostasis achieved with:  Direct pressure   Wound exploration: wound explored through full  range of motion and entire depth of wound probed and visualized     Wound extent: no foreign bodies/material noted, no muscle damage noted, no nerve damage noted, no tendon damage noted, no underlying fracture noted and no vascular damage noted     Contaminated: no   Treatment:    Area cleansed with:  Betadine, saline and Shur-Clens   Amount of cleaning:  Standard   Visualized foreign bodies/material removed: no   Skin repair:    Repair method:  Staples   Number of staples:  5 Approximation:    Approximation:  Loose Post-procedure details:    Dressing:  Open (no dressing)   Patient tolerance of procedure:  Tolerated well, no immediate complications     ____________________________________________   INITIAL IMPRESSION / ASSESSMENT AND PLAN / ED COURSE  Pertinent labs & imaging results that were available during my care of the patient were reviewed by me and considered in my medical decision making (see chart for details).  Patient presents to the ED after witnessed fall. Patient with 4 cm scalp laceration. Edges are jagged but relatively easy to re-approximate. CT imaging and plain films reviewed with no acute findings. Family at bedside. Discussed results and wound care with need for staple removal in 7-10 days. Patient at mental status baseline.   At this time, I do not feel there is any life-threatening condition present. I have reviewed and discussed all results (EKG, imaging, lab, urine as appropriate), exam findings with patient. I have reviewed nursing notes and appropriate previous records.  I feel the patient is safe to be discharged home without further emergent workup. Discussed usual and customary return precautions. Patient and family (if present) verbalize understanding and are comfortable with this plan.  Patient will follow-up with their primary care provider. If they do not have a primary care provider, information for follow-up has been provided to them. All questions  have been answered.  ____________________________________________  FINAL CLINICAL IMPRESSION(S) / ED DIAGNOSES  Final diagnoses:  Injury of head, initial encounter  Laceration of scalp, initial encounter  Strain of neck muscle, initial encounter  Acute pain of right shoulder  Right hip pain  Acute left ankle pain  Fall, initial encounter     MEDICATIONS GIVEN DURING THIS VISIT:  Medications  lidocaine-EPINEPHrine (XYLOCAINE-EPINEPHrine) 1 %-1:200000 (PF) injection 10 mL (has no administration in time range)    Note:  This document was prepared using Dragon voice recognition software and may include unintentional dictation errors.  Nanda Quinton, MD Emergency Medicine    Long, Wonda Olds, MD 07/08/18 [phone]

## 2018-07-08 NOTE — Discharge Instructions (Addendum)
You were seen in the Emergency Department (ED) today for a head injury.  Based on your evaluation, you may have sustained a concussion (or bruise) to your brain.  If you had a CT scan done, it did not show any evidence of serious injury or bleeding.    Your staples will need to be removed in 7-10 days and can be done at the PCP or here in the ED.   Symptoms to expect from a concussion include nausea, mild to moderate headache, difficulty concentrating or sleeping, and mild lightheadedness.  These symptoms should improve over the next few days to weeks, but it may take many weeks before you feel back to normal.  Return to the emergency department or follow-up with your primary care doctor if your symptoms are not improving over this time.  Signs of a more serious head injury include vomiting, severe headache, excessive sleepiness or confusion, and weakness or numbness in your face, arms or legs.  Return immediately to the Emergency Department if you experience any of these more concerning symptoms.    Rest, avoid strenuous physical or mental activity, and avoid activities that could potentially result in another head injury until all your symptoms from this head injury are completely resolved for at least 2-3 weeks. You may take acetaminophen over the counter according to label instructions for mild headache or scalp soreness.

## 2018-07-08 NOTE — Progress Notes (Signed)
CC:  My shoulder is sore  She has fracture of the left proximal humerus.  She is at a rest home.  She has a sling. She has no new trauma.  Pain is controlled.  NV intact.  X-rays were done of the left shoulder, reported separately.  Encounter Diagnosis  Name Primary?  . Other closed nondisplaced fracture of proximal end of left humerus with routine healing, subsequent encounter Yes   Return in one month  X-rays then  Forms for rest home completed.  Call if any problem.  Precautions discussed.   Electronically Signed Sanjuana Kava, MD 10/3/20199:26 AM

## 2018-07-10 DIAGNOSIS — E43 Unspecified severe protein-calorie malnutrition: Secondary | ICD-10-CM | POA: Diagnosis not present

## 2018-07-10 DIAGNOSIS — G300 Alzheimer's disease with early onset: Secondary | ICD-10-CM | POA: Diagnosis not present

## 2018-07-10 DIAGNOSIS — S060X9D Concussion with loss of consciousness of unspecified duration, subsequent encounter: Secondary | ICD-10-CM | POA: Diagnosis not present

## 2018-07-10 DIAGNOSIS — F329 Major depressive disorder, single episode, unspecified: Secondary | ICD-10-CM | POA: Diagnosis not present

## 2018-07-10 DIAGNOSIS — F0280 Dementia in other diseases classified elsewhere without behavioral disturbance: Secondary | ICD-10-CM | POA: Diagnosis not present

## 2018-07-10 DIAGNOSIS — Z9181 History of falling: Secondary | ICD-10-CM | POA: Diagnosis not present

## 2018-07-10 DIAGNOSIS — M25511 Pain in right shoulder: Secondary | ICD-10-CM | POA: Diagnosis not present

## 2018-07-10 DIAGNOSIS — S0181XD Laceration without foreign body of other part of head, subsequent encounter: Secondary | ICD-10-CM | POA: Diagnosis not present

## 2018-07-10 DIAGNOSIS — D500 Iron deficiency anemia secondary to blood loss (chronic): Secondary | ICD-10-CM | POA: Diagnosis not present

## 2018-07-10 DIAGNOSIS — E119 Type 2 diabetes mellitus without complications: Secondary | ICD-10-CM | POA: Diagnosis not present

## 2018-07-10 DIAGNOSIS — K31811 Angiodysplasia of stomach and duodenum with bleeding: Secondary | ICD-10-CM | POA: Diagnosis not present

## 2018-07-10 DIAGNOSIS — S42292D Other displaced fracture of upper end of left humerus, subsequent encounter for fracture with routine healing: Secondary | ICD-10-CM | POA: Diagnosis not present

## 2018-07-10 DIAGNOSIS — W050XXD Fall from non-moving wheelchair, subsequent encounter: Secondary | ICD-10-CM | POA: Diagnosis not present

## 2018-07-10 DIAGNOSIS — Z8781 Personal history of (healed) traumatic fracture: Secondary | ICD-10-CM | POA: Diagnosis not present

## 2018-07-10 DIAGNOSIS — Z7984 Long term (current) use of oral hypoglycemic drugs: Secondary | ICD-10-CM | POA: Diagnosis not present

## 2018-07-13 DIAGNOSIS — M25511 Pain in right shoulder: Secondary | ICD-10-CM | POA: Diagnosis not present

## 2018-07-13 DIAGNOSIS — S0181XD Laceration without foreign body of other part of head, subsequent encounter: Secondary | ICD-10-CM | POA: Diagnosis not present

## 2018-07-13 DIAGNOSIS — S060X9D Concussion with loss of consciousness of unspecified duration, subsequent encounter: Secondary | ICD-10-CM | POA: Diagnosis not present

## 2018-07-13 DIAGNOSIS — W050XXD Fall from non-moving wheelchair, subsequent encounter: Secondary | ICD-10-CM | POA: Diagnosis not present

## 2018-07-13 DIAGNOSIS — S42292D Other displaced fracture of upper end of left humerus, subsequent encounter for fracture with routine healing: Secondary | ICD-10-CM | POA: Diagnosis not present

## 2018-07-13 DIAGNOSIS — G300 Alzheimer's disease with early onset: Secondary | ICD-10-CM | POA: Diagnosis not present

## 2018-07-15 DIAGNOSIS — S0181XD Laceration without foreign body of other part of head, subsequent encounter: Secondary | ICD-10-CM | POA: Diagnosis not present

## 2018-07-15 DIAGNOSIS — S060X9D Concussion with loss of consciousness of unspecified duration, subsequent encounter: Secondary | ICD-10-CM | POA: Diagnosis not present

## 2018-07-15 DIAGNOSIS — W050XXD Fall from non-moving wheelchair, subsequent encounter: Secondary | ICD-10-CM | POA: Diagnosis not present

## 2018-07-15 DIAGNOSIS — S42292D Other displaced fracture of upper end of left humerus, subsequent encounter for fracture with routine healing: Secondary | ICD-10-CM | POA: Diagnosis not present

## 2018-07-15 DIAGNOSIS — M25511 Pain in right shoulder: Secondary | ICD-10-CM | POA: Diagnosis not present

## 2018-07-15 DIAGNOSIS — G300 Alzheimer's disease with early onset: Secondary | ICD-10-CM | POA: Diagnosis not present

## 2018-07-16 DIAGNOSIS — G300 Alzheimer's disease with early onset: Secondary | ICD-10-CM | POA: Diagnosis not present

## 2018-07-16 DIAGNOSIS — S0181XD Laceration without foreign body of other part of head, subsequent encounter: Secondary | ICD-10-CM | POA: Diagnosis not present

## 2018-07-16 DIAGNOSIS — M25511 Pain in right shoulder: Secondary | ICD-10-CM | POA: Diagnosis not present

## 2018-07-16 DIAGNOSIS — W050XXD Fall from non-moving wheelchair, subsequent encounter: Secondary | ICD-10-CM | POA: Diagnosis not present

## 2018-07-16 DIAGNOSIS — S42292D Other displaced fracture of upper end of left humerus, subsequent encounter for fracture with routine healing: Secondary | ICD-10-CM | POA: Diagnosis not present

## 2018-07-16 DIAGNOSIS — S060X9D Concussion with loss of consciousness of unspecified duration, subsequent encounter: Secondary | ICD-10-CM | POA: Diagnosis not present

## 2018-07-19 DIAGNOSIS — S42292D Other displaced fracture of upper end of left humerus, subsequent encounter for fracture with routine healing: Secondary | ICD-10-CM | POA: Diagnosis not present

## 2018-07-19 DIAGNOSIS — M25511 Pain in right shoulder: Secondary | ICD-10-CM | POA: Diagnosis not present

## 2018-07-19 DIAGNOSIS — S060X9D Concussion with loss of consciousness of unspecified duration, subsequent encounter: Secondary | ICD-10-CM | POA: Diagnosis not present

## 2018-07-19 DIAGNOSIS — W050XXD Fall from non-moving wheelchair, subsequent encounter: Secondary | ICD-10-CM | POA: Diagnosis not present

## 2018-07-19 DIAGNOSIS — S0181XD Laceration without foreign body of other part of head, subsequent encounter: Secondary | ICD-10-CM | POA: Diagnosis not present

## 2018-07-19 DIAGNOSIS — G300 Alzheimer's disease with early onset: Secondary | ICD-10-CM | POA: Diagnosis not present

## 2018-07-20 DIAGNOSIS — M25511 Pain in right shoulder: Secondary | ICD-10-CM | POA: Diagnosis not present

## 2018-07-20 DIAGNOSIS — S0181XD Laceration without foreign body of other part of head, subsequent encounter: Secondary | ICD-10-CM | POA: Diagnosis not present

## 2018-07-20 DIAGNOSIS — W050XXD Fall from non-moving wheelchair, subsequent encounter: Secondary | ICD-10-CM | POA: Diagnosis not present

## 2018-07-20 DIAGNOSIS — S42292D Other displaced fracture of upper end of left humerus, subsequent encounter for fracture with routine healing: Secondary | ICD-10-CM | POA: Diagnosis not present

## 2018-07-20 DIAGNOSIS — G300 Alzheimer's disease with early onset: Secondary | ICD-10-CM | POA: Diagnosis not present

## 2018-07-20 DIAGNOSIS — S060X9D Concussion with loss of consciousness of unspecified duration, subsequent encounter: Secondary | ICD-10-CM | POA: Diagnosis not present

## 2018-07-21 DIAGNOSIS — M25511 Pain in right shoulder: Secondary | ICD-10-CM | POA: Diagnosis not present

## 2018-07-21 DIAGNOSIS — S060X9D Concussion with loss of consciousness of unspecified duration, subsequent encounter: Secondary | ICD-10-CM | POA: Diagnosis not present

## 2018-07-21 DIAGNOSIS — W050XXD Fall from non-moving wheelchair, subsequent encounter: Secondary | ICD-10-CM | POA: Diagnosis not present

## 2018-07-21 DIAGNOSIS — S42292D Other displaced fracture of upper end of left humerus, subsequent encounter for fracture with routine healing: Secondary | ICD-10-CM | POA: Diagnosis not present

## 2018-07-21 DIAGNOSIS — S0181XD Laceration without foreign body of other part of head, subsequent encounter: Secondary | ICD-10-CM | POA: Diagnosis not present

## 2018-07-21 DIAGNOSIS — G300 Alzheimer's disease with early onset: Secondary | ICD-10-CM | POA: Diagnosis not present

## 2018-07-22 DIAGNOSIS — B351 Tinea unguium: Secondary | ICD-10-CM | POA: Diagnosis not present

## 2018-07-22 DIAGNOSIS — W050XXD Fall from non-moving wheelchair, subsequent encounter: Secondary | ICD-10-CM | POA: Diagnosis not present

## 2018-07-22 DIAGNOSIS — S42292D Other displaced fracture of upper end of left humerus, subsequent encounter for fracture with routine healing: Secondary | ICD-10-CM | POA: Diagnosis not present

## 2018-07-22 DIAGNOSIS — S0181XD Laceration without foreign body of other part of head, subsequent encounter: Secondary | ICD-10-CM | POA: Diagnosis not present

## 2018-07-22 DIAGNOSIS — G300 Alzheimer's disease with early onset: Secondary | ICD-10-CM | POA: Diagnosis not present

## 2018-07-22 DIAGNOSIS — M79674 Pain in right toe(s): Secondary | ICD-10-CM | POA: Diagnosis not present

## 2018-07-22 DIAGNOSIS — S060X9D Concussion with loss of consciousness of unspecified duration, subsequent encounter: Secondary | ICD-10-CM | POA: Diagnosis not present

## 2018-07-22 DIAGNOSIS — M25511 Pain in right shoulder: Secondary | ICD-10-CM | POA: Diagnosis not present

## 2018-07-22 DIAGNOSIS — M79675 Pain in left toe(s): Secondary | ICD-10-CM | POA: Diagnosis not present

## 2018-07-23 DIAGNOSIS — M25511 Pain in right shoulder: Secondary | ICD-10-CM | POA: Diagnosis not present

## 2018-07-23 DIAGNOSIS — S42292D Other displaced fracture of upper end of left humerus, subsequent encounter for fracture with routine healing: Secondary | ICD-10-CM | POA: Diagnosis not present

## 2018-07-23 DIAGNOSIS — S0181XD Laceration without foreign body of other part of head, subsequent encounter: Secondary | ICD-10-CM | POA: Diagnosis not present

## 2018-07-23 DIAGNOSIS — W050XXD Fall from non-moving wheelchair, subsequent encounter: Secondary | ICD-10-CM | POA: Diagnosis not present

## 2018-07-23 DIAGNOSIS — G300 Alzheimer's disease with early onset: Secondary | ICD-10-CM | POA: Diagnosis not present

## 2018-07-23 DIAGNOSIS — S060X9D Concussion with loss of consciousness of unspecified duration, subsequent encounter: Secondary | ICD-10-CM | POA: Diagnosis not present

## 2018-07-24 DIAGNOSIS — W050XXD Fall from non-moving wheelchair, subsequent encounter: Secondary | ICD-10-CM | POA: Diagnosis not present

## 2018-07-24 DIAGNOSIS — M25511 Pain in right shoulder: Secondary | ICD-10-CM | POA: Diagnosis not present

## 2018-07-24 DIAGNOSIS — G300 Alzheimer's disease with early onset: Secondary | ICD-10-CM | POA: Diagnosis not present

## 2018-07-24 DIAGNOSIS — S0181XD Laceration without foreign body of other part of head, subsequent encounter: Secondary | ICD-10-CM | POA: Diagnosis not present

## 2018-07-24 DIAGNOSIS — S42292D Other displaced fracture of upper end of left humerus, subsequent encounter for fracture with routine healing: Secondary | ICD-10-CM | POA: Diagnosis not present

## 2018-07-24 DIAGNOSIS — S060X9D Concussion with loss of consciousness of unspecified duration, subsequent encounter: Secondary | ICD-10-CM | POA: Diagnosis not present

## 2018-07-25 DIAGNOSIS — S060X9D Concussion with loss of consciousness of unspecified duration, subsequent encounter: Secondary | ICD-10-CM | POA: Diagnosis not present

## 2018-07-25 DIAGNOSIS — S42292D Other displaced fracture of upper end of left humerus, subsequent encounter for fracture with routine healing: Secondary | ICD-10-CM | POA: Diagnosis not present

## 2018-07-25 DIAGNOSIS — G300 Alzheimer's disease with early onset: Secondary | ICD-10-CM | POA: Diagnosis not present

## 2018-07-25 DIAGNOSIS — W050XXD Fall from non-moving wheelchair, subsequent encounter: Secondary | ICD-10-CM | POA: Diagnosis not present

## 2018-07-25 DIAGNOSIS — S0181XD Laceration without foreign body of other part of head, subsequent encounter: Secondary | ICD-10-CM | POA: Diagnosis not present

## 2018-07-25 DIAGNOSIS — M25511 Pain in right shoulder: Secondary | ICD-10-CM | POA: Diagnosis not present

## 2018-07-26 DIAGNOSIS — S060X9D Concussion with loss of consciousness of unspecified duration, subsequent encounter: Secondary | ICD-10-CM | POA: Diagnosis not present

## 2018-07-26 DIAGNOSIS — W050XXD Fall from non-moving wheelchair, subsequent encounter: Secondary | ICD-10-CM | POA: Diagnosis not present

## 2018-07-26 DIAGNOSIS — M25511 Pain in right shoulder: Secondary | ICD-10-CM | POA: Diagnosis not present

## 2018-07-26 DIAGNOSIS — S0181XD Laceration without foreign body of other part of head, subsequent encounter: Secondary | ICD-10-CM | POA: Diagnosis not present

## 2018-07-26 DIAGNOSIS — G300 Alzheimer's disease with early onset: Secondary | ICD-10-CM | POA: Diagnosis not present

## 2018-07-26 DIAGNOSIS — S42292D Other displaced fracture of upper end of left humerus, subsequent encounter for fracture with routine healing: Secondary | ICD-10-CM | POA: Diagnosis not present

## 2018-07-27 DIAGNOSIS — S42292D Other displaced fracture of upper end of left humerus, subsequent encounter for fracture with routine healing: Secondary | ICD-10-CM | POA: Diagnosis not present

## 2018-07-27 DIAGNOSIS — M25511 Pain in right shoulder: Secondary | ICD-10-CM | POA: Diagnosis not present

## 2018-07-27 DIAGNOSIS — S0181XD Laceration without foreign body of other part of head, subsequent encounter: Secondary | ICD-10-CM | POA: Diagnosis not present

## 2018-07-27 DIAGNOSIS — G300 Alzheimer's disease with early onset: Secondary | ICD-10-CM | POA: Diagnosis not present

## 2018-07-27 DIAGNOSIS — S060X9D Concussion with loss of consciousness of unspecified duration, subsequent encounter: Secondary | ICD-10-CM | POA: Diagnosis not present

## 2018-07-27 DIAGNOSIS — W050XXD Fall from non-moving wheelchair, subsequent encounter: Secondary | ICD-10-CM | POA: Diagnosis not present

## 2018-07-28 DIAGNOSIS — S060X9D Concussion with loss of consciousness of unspecified duration, subsequent encounter: Secondary | ICD-10-CM | POA: Diagnosis not present

## 2018-07-28 DIAGNOSIS — S42292D Other displaced fracture of upper end of left humerus, subsequent encounter for fracture with routine healing: Secondary | ICD-10-CM | POA: Diagnosis not present

## 2018-07-28 DIAGNOSIS — M25511 Pain in right shoulder: Secondary | ICD-10-CM | POA: Diagnosis not present

## 2018-07-28 DIAGNOSIS — W050XXD Fall from non-moving wheelchair, subsequent encounter: Secondary | ICD-10-CM | POA: Diagnosis not present

## 2018-07-28 DIAGNOSIS — S0181XD Laceration without foreign body of other part of head, subsequent encounter: Secondary | ICD-10-CM | POA: Diagnosis not present

## 2018-07-28 DIAGNOSIS — G300 Alzheimer's disease with early onset: Secondary | ICD-10-CM | POA: Diagnosis not present

## 2018-07-29 DIAGNOSIS — S0181XD Laceration without foreign body of other part of head, subsequent encounter: Secondary | ICD-10-CM | POA: Diagnosis not present

## 2018-07-29 DIAGNOSIS — S060X9D Concussion with loss of consciousness of unspecified duration, subsequent encounter: Secondary | ICD-10-CM | POA: Diagnosis not present

## 2018-07-29 DIAGNOSIS — M25511 Pain in right shoulder: Secondary | ICD-10-CM | POA: Diagnosis not present

## 2018-07-29 DIAGNOSIS — S42292D Other displaced fracture of upper end of left humerus, subsequent encounter for fracture with routine healing: Secondary | ICD-10-CM | POA: Diagnosis not present

## 2018-07-29 DIAGNOSIS — G300 Alzheimer's disease with early onset: Secondary | ICD-10-CM | POA: Diagnosis not present

## 2018-07-29 DIAGNOSIS — W050XXD Fall from non-moving wheelchair, subsequent encounter: Secondary | ICD-10-CM | POA: Diagnosis not present

## 2018-07-30 DIAGNOSIS — W050XXD Fall from non-moving wheelchair, subsequent encounter: Secondary | ICD-10-CM | POA: Diagnosis not present

## 2018-07-30 DIAGNOSIS — S060X9D Concussion with loss of consciousness of unspecified duration, subsequent encounter: Secondary | ICD-10-CM | POA: Diagnosis not present

## 2018-07-30 DIAGNOSIS — G300 Alzheimer's disease with early onset: Secondary | ICD-10-CM | POA: Diagnosis not present

## 2018-07-30 DIAGNOSIS — M25511 Pain in right shoulder: Secondary | ICD-10-CM | POA: Diagnosis not present

## 2018-07-30 DIAGNOSIS — S0181XD Laceration without foreign body of other part of head, subsequent encounter: Secondary | ICD-10-CM | POA: Diagnosis not present

## 2018-07-30 DIAGNOSIS — S42292D Other displaced fracture of upper end of left humerus, subsequent encounter for fracture with routine healing: Secondary | ICD-10-CM | POA: Diagnosis not present

## 2018-07-31 DIAGNOSIS — M25511 Pain in right shoulder: Secondary | ICD-10-CM | POA: Diagnosis not present

## 2018-07-31 DIAGNOSIS — G300 Alzheimer's disease with early onset: Secondary | ICD-10-CM | POA: Diagnosis not present

## 2018-07-31 DIAGNOSIS — S060X9D Concussion with loss of consciousness of unspecified duration, subsequent encounter: Secondary | ICD-10-CM | POA: Diagnosis not present

## 2018-07-31 DIAGNOSIS — W050XXD Fall from non-moving wheelchair, subsequent encounter: Secondary | ICD-10-CM | POA: Diagnosis not present

## 2018-07-31 DIAGNOSIS — S0181XD Laceration without foreign body of other part of head, subsequent encounter: Secondary | ICD-10-CM | POA: Diagnosis not present

## 2018-07-31 DIAGNOSIS — S42292D Other displaced fracture of upper end of left humerus, subsequent encounter for fracture with routine healing: Secondary | ICD-10-CM | POA: Diagnosis not present

## 2018-08-01 DIAGNOSIS — S0181XD Laceration without foreign body of other part of head, subsequent encounter: Secondary | ICD-10-CM | POA: Diagnosis not present

## 2018-08-01 DIAGNOSIS — S060X9D Concussion with loss of consciousness of unspecified duration, subsequent encounter: Secondary | ICD-10-CM | POA: Diagnosis not present

## 2018-08-01 DIAGNOSIS — M25511 Pain in right shoulder: Secondary | ICD-10-CM | POA: Diagnosis not present

## 2018-08-01 DIAGNOSIS — G300 Alzheimer's disease with early onset: Secondary | ICD-10-CM | POA: Diagnosis not present

## 2018-08-01 DIAGNOSIS — W050XXD Fall from non-moving wheelchair, subsequent encounter: Secondary | ICD-10-CM | POA: Diagnosis not present

## 2018-08-01 DIAGNOSIS — S42292D Other displaced fracture of upper end of left humerus, subsequent encounter for fracture with routine healing: Secondary | ICD-10-CM | POA: Diagnosis not present

## 2018-08-02 DIAGNOSIS — W050XXD Fall from non-moving wheelchair, subsequent encounter: Secondary | ICD-10-CM | POA: Diagnosis not present

## 2018-08-02 DIAGNOSIS — M25511 Pain in right shoulder: Secondary | ICD-10-CM | POA: Diagnosis not present

## 2018-08-02 DIAGNOSIS — G300 Alzheimer's disease with early onset: Secondary | ICD-10-CM | POA: Diagnosis not present

## 2018-08-02 DIAGNOSIS — S42292D Other displaced fracture of upper end of left humerus, subsequent encounter for fracture with routine healing: Secondary | ICD-10-CM | POA: Diagnosis not present

## 2018-08-02 DIAGNOSIS — S060X9D Concussion with loss of consciousness of unspecified duration, subsequent encounter: Secondary | ICD-10-CM | POA: Diagnosis not present

## 2018-08-02 DIAGNOSIS — S0181XD Laceration without foreign body of other part of head, subsequent encounter: Secondary | ICD-10-CM | POA: Diagnosis not present

## 2018-08-03 DIAGNOSIS — S0181XD Laceration without foreign body of other part of head, subsequent encounter: Secondary | ICD-10-CM | POA: Diagnosis not present

## 2018-08-03 DIAGNOSIS — S42292D Other displaced fracture of upper end of left humerus, subsequent encounter for fracture with routine healing: Secondary | ICD-10-CM | POA: Diagnosis not present

## 2018-08-03 DIAGNOSIS — W050XXD Fall from non-moving wheelchair, subsequent encounter: Secondary | ICD-10-CM | POA: Diagnosis not present

## 2018-08-03 DIAGNOSIS — S060X9D Concussion with loss of consciousness of unspecified duration, subsequent encounter: Secondary | ICD-10-CM | POA: Diagnosis not present

## 2018-08-03 DIAGNOSIS — M25511 Pain in right shoulder: Secondary | ICD-10-CM | POA: Diagnosis not present

## 2018-08-03 DIAGNOSIS — G300 Alzheimer's disease with early onset: Secondary | ICD-10-CM | POA: Diagnosis not present

## 2018-08-04 DIAGNOSIS — S060X9D Concussion with loss of consciousness of unspecified duration, subsequent encounter: Secondary | ICD-10-CM | POA: Diagnosis not present

## 2018-08-04 DIAGNOSIS — W050XXD Fall from non-moving wheelchair, subsequent encounter: Secondary | ICD-10-CM | POA: Diagnosis not present

## 2018-08-04 DIAGNOSIS — G300 Alzheimer's disease with early onset: Secondary | ICD-10-CM | POA: Diagnosis not present

## 2018-08-04 DIAGNOSIS — M25511 Pain in right shoulder: Secondary | ICD-10-CM | POA: Diagnosis not present

## 2018-08-04 DIAGNOSIS — S0181XD Laceration without foreign body of other part of head, subsequent encounter: Secondary | ICD-10-CM | POA: Diagnosis not present

## 2018-08-04 DIAGNOSIS — S42292D Other displaced fracture of upper end of left humerus, subsequent encounter for fracture with routine healing: Secondary | ICD-10-CM | POA: Diagnosis not present

## 2018-08-05 ENCOUNTER — Ambulatory Visit (INDEPENDENT_AMBULATORY_CARE_PROVIDER_SITE_OTHER): Payer: Medicare Other

## 2018-08-05 ENCOUNTER — Ambulatory Visit (INDEPENDENT_AMBULATORY_CARE_PROVIDER_SITE_OTHER): Payer: Medicare Other | Admitting: Orthopaedic Surgery

## 2018-08-05 ENCOUNTER — Encounter: Payer: Self-pay | Admitting: Orthopaedic Surgery

## 2018-08-05 DIAGNOSIS — S42295D Other nondisplaced fracture of upper end of left humerus, subsequent encounter for fracture with routine healing: Secondary | ICD-10-CM | POA: Diagnosis not present

## 2018-08-05 DIAGNOSIS — W050XXD Fall from non-moving wheelchair, subsequent encounter: Secondary | ICD-10-CM | POA: Diagnosis not present

## 2018-08-05 DIAGNOSIS — G300 Alzheimer's disease with early onset: Secondary | ICD-10-CM | POA: Diagnosis not present

## 2018-08-05 DIAGNOSIS — S0181XD Laceration without foreign body of other part of head, subsequent encounter: Secondary | ICD-10-CM | POA: Diagnosis not present

## 2018-08-05 DIAGNOSIS — S42292D Other displaced fracture of upper end of left humerus, subsequent encounter for fracture with routine healing: Secondary | ICD-10-CM | POA: Diagnosis not present

## 2018-08-05 DIAGNOSIS — S060X9D Concussion with loss of consciousness of unspecified duration, subsequent encounter: Secondary | ICD-10-CM | POA: Diagnosis not present

## 2018-08-05 DIAGNOSIS — M25511 Pain in right shoulder: Secondary | ICD-10-CM | POA: Diagnosis not present

## 2018-08-05 NOTE — Progress Notes (Signed)
CC:  My shoulder hurts  She has fracture of the proximal humerus on the right.  She has been using the sling.  She has no new injury  She has limited motion of the right shoulder.  NV intact.  She is hard of hearing.  X-rays were done of her right shoulder, reported separately.  Encounter Diagnosis  Name Primary?  . Other closed nondisplaced fracture of proximal end of left humerus with routine healing, subsequent encounter Yes   I will see her in three weeks with x-rays then.  Forms for the rest home completed.  Call if any problem.  Precautions discussed.   Electronically Signed Sanjuana Kava, MD 10/31/20199:59 AM

## 2018-08-06 DIAGNOSIS — W050XXD Fall from non-moving wheelchair, subsequent encounter: Secondary | ICD-10-CM | POA: Diagnosis not present

## 2018-08-06 DIAGNOSIS — S0181XD Laceration without foreign body of other part of head, subsequent encounter: Secondary | ICD-10-CM | POA: Diagnosis not present

## 2018-08-06 DIAGNOSIS — S42292D Other displaced fracture of upper end of left humerus, subsequent encounter for fracture with routine healing: Secondary | ICD-10-CM | POA: Diagnosis not present

## 2018-08-06 DIAGNOSIS — G300 Alzheimer's disease with early onset: Secondary | ICD-10-CM | POA: Diagnosis not present

## 2018-08-06 DIAGNOSIS — S060X9D Concussion with loss of consciousness of unspecified duration, subsequent encounter: Secondary | ICD-10-CM | POA: Diagnosis not present

## 2018-08-06 DIAGNOSIS — M25511 Pain in right shoulder: Secondary | ICD-10-CM | POA: Diagnosis not present

## 2018-08-07 DIAGNOSIS — S060X9D Concussion with loss of consciousness of unspecified duration, subsequent encounter: Secondary | ICD-10-CM | POA: Diagnosis not present

## 2018-08-07 DIAGNOSIS — W050XXD Fall from non-moving wheelchair, subsequent encounter: Secondary | ICD-10-CM | POA: Diagnosis not present

## 2018-08-07 DIAGNOSIS — G300 Alzheimer's disease with early onset: Secondary | ICD-10-CM | POA: Diagnosis not present

## 2018-08-07 DIAGNOSIS — S42292D Other displaced fracture of upper end of left humerus, subsequent encounter for fracture with routine healing: Secondary | ICD-10-CM | POA: Diagnosis not present

## 2018-08-07 DIAGNOSIS — M25511 Pain in right shoulder: Secondary | ICD-10-CM | POA: Diagnosis not present

## 2018-08-07 DIAGNOSIS — S0181XD Laceration without foreign body of other part of head, subsequent encounter: Secondary | ICD-10-CM | POA: Diagnosis not present

## 2018-08-08 DIAGNOSIS — S0181XD Laceration without foreign body of other part of head, subsequent encounter: Secondary | ICD-10-CM | POA: Diagnosis not present

## 2018-08-08 DIAGNOSIS — W050XXD Fall from non-moving wheelchair, subsequent encounter: Secondary | ICD-10-CM | POA: Diagnosis not present

## 2018-08-08 DIAGNOSIS — G300 Alzheimer's disease with early onset: Secondary | ICD-10-CM | POA: Diagnosis not present

## 2018-08-08 DIAGNOSIS — S060X9D Concussion with loss of consciousness of unspecified duration, subsequent encounter: Secondary | ICD-10-CM | POA: Diagnosis not present

## 2018-08-08 DIAGNOSIS — M25511 Pain in right shoulder: Secondary | ICD-10-CM | POA: Diagnosis not present

## 2018-08-08 DIAGNOSIS — S42292D Other displaced fracture of upper end of left humerus, subsequent encounter for fracture with routine healing: Secondary | ICD-10-CM | POA: Diagnosis not present

## 2018-08-09 ENCOUNTER — Other Ambulatory Visit (HOSPITAL_COMMUNITY)
Admission: RE | Admit: 2018-08-09 | Discharge: 2018-08-09 | Disposition: A | Discharge status: Home / Self Care | Payer: Medicare Other | Source: Other Acute Inpatient Hospital | Attending: Pulmonary Disease | Admitting: Pulmonary Disease

## 2018-08-09 DIAGNOSIS — S42292D Other displaced fracture of upper end of left humerus, subsequent encounter for fracture with routine healing: Secondary | ICD-10-CM | POA: Diagnosis not present

## 2018-08-09 DIAGNOSIS — G300 Alzheimer's disease with early onset: Secondary | ICD-10-CM | POA: Diagnosis not present

## 2018-08-09 DIAGNOSIS — W050XXD Fall from non-moving wheelchair, subsequent encounter: Secondary | ICD-10-CM | POA: Diagnosis not present

## 2018-08-09 DIAGNOSIS — S0181XD Laceration without foreign body of other part of head, subsequent encounter: Secondary | ICD-10-CM | POA: Diagnosis not present

## 2018-08-09 DIAGNOSIS — N390 Urinary tract infection, site not specified: Secondary | ICD-10-CM | POA: Insufficient documentation

## 2018-08-09 DIAGNOSIS — M25511 Pain in right shoulder: Secondary | ICD-10-CM | POA: Diagnosis not present

## 2018-08-09 DIAGNOSIS — S060X9D Concussion with loss of consciousness of unspecified duration, subsequent encounter: Secondary | ICD-10-CM | POA: Diagnosis not present

## 2018-08-09 LAB — URINALYSIS, COMPLETE (UACMP) WITH MICROSCOPIC
BILIRUBIN URINE: NEGATIVE
Glucose, UA: NEGATIVE mg/dL
KETONES UR: NEGATIVE mg/dL
Nitrite: NEGATIVE
PROTEIN: NEGATIVE mg/dL
Specific Gravity, Urine: 1.015 (ref 1.005–1.030)
pH: 5 (ref 5.0–8.0)

## 2018-08-10 ENCOUNTER — Emergency Department (HOSPITAL_COMMUNITY): Payer: Medicare Other

## 2018-08-10 ENCOUNTER — Inpatient Hospital Stay (HOSPITAL_COMMUNITY)
Admission: EM | Admit: 2018-08-10 | Discharge: 2018-08-13 | DRG: 871 | Disposition: A | Discharge status: Hospice | Payer: Medicare Other | Source: Skilled Nursing Facility | Attending: Internal Medicine | Admitting: Internal Medicine

## 2018-08-10 ENCOUNTER — Encounter (HOSPITAL_COMMUNITY): Payer: Self-pay | Admitting: Emergency Medicine

## 2018-08-10 ENCOUNTER — Other Ambulatory Visit: Payer: Self-pay

## 2018-08-10 DIAGNOSIS — E039 Hypothyroidism, unspecified: Secondary | ICD-10-CM | POA: Diagnosis present

## 2018-08-10 DIAGNOSIS — G300 Alzheimer's disease with early onset: Secondary | ICD-10-CM | POA: Diagnosis not present

## 2018-08-10 DIAGNOSIS — E870 Hyperosmolality and hypernatremia: Secondary | ICD-10-CM | POA: Diagnosis present

## 2018-08-10 DIAGNOSIS — L89609 Pressure ulcer of unspecified heel, unspecified stage: Secondary | ICD-10-CM

## 2018-08-10 DIAGNOSIS — Z515 Encounter for palliative care: Secondary | ICD-10-CM | POA: Diagnosis not present

## 2018-08-10 DIAGNOSIS — E1165 Type 2 diabetes mellitus with hyperglycemia: Secondary | ICD-10-CM | POA: Diagnosis present

## 2018-08-10 DIAGNOSIS — K31811 Angiodysplasia of stomach and duodenum with bleeding: Secondary | ICD-10-CM | POA: Diagnosis present

## 2018-08-10 DIAGNOSIS — K219 Gastro-esophageal reflux disease without esophagitis: Secondary | ICD-10-CM | POA: Diagnosis present

## 2018-08-10 DIAGNOSIS — A419 Sepsis, unspecified organism: Principal | ICD-10-CM | POA: Diagnosis present

## 2018-08-10 DIAGNOSIS — Z8711 Personal history of peptic ulcer disease: Secondary | ICD-10-CM

## 2018-08-10 DIAGNOSIS — Z7984 Long term (current) use of oral hypoglycemic drugs: Secondary | ICD-10-CM

## 2018-08-10 DIAGNOSIS — D649 Anemia, unspecified: Secondary | ICD-10-CM

## 2018-08-10 DIAGNOSIS — S060X9D Concussion with loss of consciousness of unspecified duration, subsequent encounter: Secondary | ICD-10-CM | POA: Diagnosis not present

## 2018-08-10 DIAGNOSIS — E871 Hypo-osmolality and hyponatremia: Secondary | ICD-10-CM | POA: Diagnosis present

## 2018-08-10 DIAGNOSIS — Z833 Family history of diabetes mellitus: Secondary | ICD-10-CM

## 2018-08-10 DIAGNOSIS — L03312 Cellulitis of back [any part except buttock]: Secondary | ICD-10-CM | POA: Diagnosis present

## 2018-08-10 DIAGNOSIS — Z8614 Personal history of Methicillin resistant Staphylococcus aureus infection: Secondary | ICD-10-CM

## 2018-08-10 DIAGNOSIS — S31000A Unspecified open wound of lower back and pelvis without penetration into retroperitoneum, initial encounter: Secondary | ICD-10-CM | POA: Diagnosis not present

## 2018-08-10 DIAGNOSIS — F329 Major depressive disorder, single episode, unspecified: Secondary | ICD-10-CM | POA: Diagnosis present

## 2018-08-10 DIAGNOSIS — I959 Hypotension, unspecified: Secondary | ICD-10-CM | POA: Diagnosis not present

## 2018-08-10 DIAGNOSIS — N390 Urinary tract infection, site not specified: Secondary | ICD-10-CM | POA: Diagnosis not present

## 2018-08-10 DIAGNOSIS — Z66 Do not resuscitate: Secondary | ICD-10-CM | POA: Diagnosis present

## 2018-08-10 DIAGNOSIS — R627 Adult failure to thrive: Secondary | ICD-10-CM | POA: Diagnosis present

## 2018-08-10 DIAGNOSIS — E119 Type 2 diabetes mellitus without complications: Secondary | ICD-10-CM

## 2018-08-10 DIAGNOSIS — S42292D Other displaced fracture of upper end of left humerus, subsequent encounter for fracture with routine healing: Secondary | ICD-10-CM | POA: Diagnosis not present

## 2018-08-10 DIAGNOSIS — E872 Acidosis, unspecified: Secondary | ICD-10-CM | POA: Diagnosis present

## 2018-08-10 DIAGNOSIS — D509 Iron deficiency anemia, unspecified: Secondary | ICD-10-CM | POA: Diagnosis present

## 2018-08-10 DIAGNOSIS — F0390 Unspecified dementia without behavioral disturbance: Secondary | ICD-10-CM

## 2018-08-10 DIAGNOSIS — Z886 Allergy status to analgesic agent status: Secondary | ICD-10-CM

## 2018-08-10 DIAGNOSIS — K921 Melena: Secondary | ICD-10-CM | POA: Diagnosis not present

## 2018-08-10 DIAGNOSIS — Z681 Body mass index (BMI) 19 or less, adult: Secondary | ICD-10-CM

## 2018-08-10 DIAGNOSIS — B952 Enterococcus as the cause of diseases classified elsewhere: Secondary | ICD-10-CM | POA: Diagnosis present

## 2018-08-10 DIAGNOSIS — L89159 Pressure ulcer of sacral region, unspecified stage: Secondary | ICD-10-CM | POA: Diagnosis present

## 2018-08-10 DIAGNOSIS — Z7189 Other specified counseling: Secondary | ICD-10-CM | POA: Diagnosis not present

## 2018-08-10 DIAGNOSIS — R918 Other nonspecific abnormal finding of lung field: Secondary | ICD-10-CM | POA: Diagnosis not present

## 2018-08-10 DIAGNOSIS — Z8781 Personal history of (healed) traumatic fracture: Secondary | ICD-10-CM

## 2018-08-10 DIAGNOSIS — R531 Weakness: Secondary | ICD-10-CM | POA: Diagnosis not present

## 2018-08-10 DIAGNOSIS — D62 Acute posthemorrhagic anemia: Secondary | ICD-10-CM | POA: Diagnosis not present

## 2018-08-10 DIAGNOSIS — Z85850 Personal history of malignant neoplasm of thyroid: Secondary | ICD-10-CM | POA: Diagnosis not present

## 2018-08-10 DIAGNOSIS — Z7401 Bed confinement status: Secondary | ICD-10-CM

## 2018-08-10 DIAGNOSIS — Z79890 Hormone replacement therapy (postmenopausal): Secondary | ICD-10-CM

## 2018-08-10 DIAGNOSIS — H9190 Unspecified hearing loss, unspecified ear: Secondary | ICD-10-CM | POA: Diagnosis present

## 2018-08-10 DIAGNOSIS — L89610 Pressure ulcer of right heel, unstageable: Secondary | ICD-10-CM | POA: Diagnosis present

## 2018-08-10 DIAGNOSIS — Z9181 History of falling: Secondary | ICD-10-CM

## 2018-08-10 DIAGNOSIS — Z9841 Cataract extraction status, right eye: Secondary | ICD-10-CM

## 2018-08-10 DIAGNOSIS — Z79899 Other long term (current) drug therapy: Secondary | ICD-10-CM

## 2018-08-10 DIAGNOSIS — Z79891 Long term (current) use of opiate analgesic: Secondary | ICD-10-CM

## 2018-08-10 DIAGNOSIS — G309 Alzheimer's disease, unspecified: Secondary | ICD-10-CM | POA: Diagnosis present

## 2018-08-10 DIAGNOSIS — M25511 Pain in right shoulder: Secondary | ICD-10-CM | POA: Diagnosis not present

## 2018-08-10 DIAGNOSIS — L89150 Pressure ulcer of sacral region, unstageable: Secondary | ICD-10-CM | POA: Diagnosis present

## 2018-08-10 DIAGNOSIS — S0181XD Laceration without foreign body of other part of head, subsequent encounter: Secondary | ICD-10-CM | POA: Diagnosis not present

## 2018-08-10 DIAGNOSIS — F0280 Dementia in other diseases classified elsewhere without behavioral disturbance: Secondary | ICD-10-CM | POA: Diagnosis present

## 2018-08-10 DIAGNOSIS — I509 Heart failure, unspecified: Secondary | ICD-10-CM | POA: Diagnosis present

## 2018-08-10 DIAGNOSIS — W050XXD Fall from non-moving wheelchair, subsequent encounter: Secondary | ICD-10-CM | POA: Diagnosis not present

## 2018-08-10 DIAGNOSIS — Z9842 Cataract extraction status, left eye: Secondary | ICD-10-CM

## 2018-08-10 DIAGNOSIS — Z9049 Acquired absence of other specified parts of digestive tract: Secondary | ICD-10-CM

## 2018-08-10 LAB — CBC WITH DIFFERENTIAL/PLATELET
Abs Immature Granulocytes: 0.17 10*3/uL — ABNORMAL HIGH (ref 0.00–0.07)
BASOS PCT: 0 %
Basophils Absolute: 0 10*3/uL (ref 0.0–0.1)
EOS PCT: 0 %
Eosinophils Absolute: 0 10*3/uL (ref 0.0–0.5)
HCT: 20.4 % — ABNORMAL LOW (ref 36.0–46.0)
HEMOGLOBIN: 5.3 g/dL — AB (ref 12.0–15.0)
Immature Granulocytes: 1 %
LYMPHS PCT: 4 %
Lymphs Abs: 0.7 10*3/uL (ref 0.7–4.0)
MCH: 24.2 pg — AB (ref 26.0–34.0)
MCHC: 26 g/dL — AB (ref 30.0–36.0)
MCV: 93.2 fL (ref 80.0–100.0)
MONO ABS: 0.7 10*3/uL (ref 0.1–1.0)
MONOS PCT: 4 %
NEUTROS PCT: 91 %
NRBC: 0.2 % (ref 0.0–0.2)
Neutro Abs: 16.8 10*3/uL — ABNORMAL HIGH (ref 1.7–7.7)
PLATELETS: 263 10*3/uL (ref 150–400)
RBC: 2.19 MIL/uL — AB (ref 3.87–5.11)
RDW: 20.3 % — ABNORMAL HIGH (ref 11.5–15.5)
WBC: 18.3 10*3/uL — ABNORMAL HIGH (ref 4.0–10.5)

## 2018-08-10 LAB — COMPREHENSIVE METABOLIC PANEL
ALK PHOS: 99 U/L (ref 38–126)
ALT: 12 U/L (ref 0–44)
ANION GAP: 18 — AB (ref 5–15)
AST: 17 U/L (ref 15–41)
Albumin: 2.1 g/dL — ABNORMAL LOW (ref 3.5–5.0)
BUN: 40 mg/dL — ABNORMAL HIGH (ref 8–23)
CALCIUM: 7.3 mg/dL — AB (ref 8.9–10.3)
CO2: 13 mmol/L — ABNORMAL LOW (ref 22–32)
CREATININE: 1.24 mg/dL — AB (ref 0.44–1.00)
Chloride: 110 mmol/L (ref 98–111)
GFR, EST AFRICAN AMERICAN: 40 mL/min — AB (ref >60)
GFR, EST NON AFRICAN AMERICAN: 35 mL/min — AB (ref >60)
Glucose, Bld: 320 mg/dL — ABNORMAL HIGH (ref 70–99)
Potassium: 3.7 mmol/L (ref 3.5–5.1)
Sodium: 141 mmol/L (ref 135–145)
Total Bilirubin: 0.5 mg/dL (ref 0.3–1.2)
Total Protein: 5.2 g/dL — ABNORMAL LOW (ref 6.5–8.1)

## 2018-08-10 LAB — GLUCOSE, CAPILLARY: Glucose-Capillary: 80 mg/dL (ref 70–99)

## 2018-08-10 LAB — LACTIC ACID, PLASMA
LACTIC ACID, VENOUS: 3.7 mmol/L — AB (ref 0.5–1.9)
Lactic Acid, Venous: 13.8 mmol/L (ref 0.5–1.9)

## 2018-08-10 LAB — CBG MONITORING, ED: Glucose-Capillary: 324 mg/dL — ABNORMAL HIGH (ref 70–99)

## 2018-08-10 LAB — POC OCCULT BLOOD, ED: FECAL OCCULT BLD: POSITIVE — AB

## 2018-08-10 MED ORDER — ACETAMINOPHEN 325 MG PO TABS: 650.0000 mg | ORAL_TABLET | Freq: Four times a day (QID) | ORAL | Status: DC | PRN

## 2018-08-10 MED ORDER — SODIUM CHLORIDE 0.9 % IV SOLN
8.0000 mg/h | INTRAVENOUS | Status: DC
  Administered 2018-08-10 – 2018-08-12 (×5): 8 mg/h via INTRAVENOUS
  Filled 2018-08-10 (×8): qty 80

## 2018-08-10 MED ORDER — ACETAMINOPHEN 650 MG RE SUPP: 650.0000 mg | Freq: Four times a day (QID) | RECTAL | Status: DC | PRN

## 2018-08-10 MED ORDER — ONDANSETRON HCL 4 MG PO TABS: 4.0000 mg | ORAL_TABLET | Freq: Four times a day (QID) | ORAL | Status: DC | PRN

## 2018-08-10 MED ORDER — LACTATED RINGERS IV BOLUS
1000.0000 mL | Freq: Once | INTRAVENOUS | Status: AC
  Administered 2018-08-10: 1000 mL via INTRAVENOUS

## 2018-08-10 MED ORDER — SODIUM CHLORIDE 0.9 % IV SOLN
10.0000 mL/h | Freq: Once | INTRAVENOUS | Status: AC
  Administered 2018-08-10: 10 mL/h via INTRAVENOUS

## 2018-08-10 MED ORDER — VANCOMYCIN HCL 500 MG IV SOLR
500.0000 mg | INTRAVENOUS | Status: DC
  Administered 2018-08-10 – 2018-08-12 (×2): 500 mg via INTRAVENOUS
  Filled 2018-08-10 (×3): qty 500

## 2018-08-10 MED ORDER — SODIUM CHLORIDE 0.9 % IV SOLN
1.0000 g | INTRAVENOUS | Status: DC
  Administered 2018-08-11: 1 g via INTRAVENOUS
  Filled 2018-08-10: qty 1

## 2018-08-10 MED ORDER — INSULIN ASPART 100 UNIT/ML ~~LOC~~ SOLN
0.0000 [IU] | SUBCUTANEOUS | Status: DC
  Administered 2018-08-10: 7 [IU] via SUBCUTANEOUS
  Administered 2018-08-11 (×2): 2 [IU] via SUBCUTANEOUS
  Administered 2018-08-11: 3 [IU] via SUBCUTANEOUS
  Administered 2018-08-12: 2 [IU] via SUBCUTANEOUS
  Administered 2018-08-12: 1 [IU] via SUBCUTANEOUS

## 2018-08-10 MED ORDER — PANTOPRAZOLE SODIUM 40 MG IV SOLR
INTRAVENOUS | Status: AC
  Filled 2018-08-10: qty 160

## 2018-08-10 MED ORDER — ONDANSETRON HCL 4 MG/2ML IJ SOLN: 4.0000 mg | Freq: Four times a day (QID) | INTRAMUSCULAR | Status: DC | PRN

## 2018-08-10 MED ORDER — SODIUM CHLORIDE 0.9 % IV SOLN
80.0000 mg | Freq: Once | INTRAVENOUS | Status: AC
  Administered 2018-08-10: 80 mg via INTRAVENOUS
  Filled 2018-08-10: qty 80

## 2018-08-10 MED ORDER — SODIUM CHLORIDE 0.9 % IV SOLN
1.0000 g | Freq: Once | INTRAVENOUS | Status: AC
  Administered 2018-08-10: 1 g via INTRAVENOUS
  Filled 2018-08-10: qty 10

## 2018-08-10 MED ORDER — INSULIN ASPART 100 UNIT/ML ~~LOC~~ SOLN
SUBCUTANEOUS | Status: AC
  Administered 2018-08-10: 7 [IU] via SUBCUTANEOUS
  Filled 2018-08-10: qty 1

## 2018-08-10 MED ORDER — PANTOPRAZOLE SODIUM 40 MG IV SOLR: 40.0000 mg | Freq: Two times a day (BID) | INTRAVENOUS | Status: DC

## 2018-08-10 MED ORDER — VANCOMYCIN HCL 500 MG IV SOLR
INTRAVENOUS | Status: AC
  Filled 2018-08-10: qty 500

## 2018-08-10 NOTE — ED Notes (Signed)
Pt noted to have black, tarry stool. Fecal occult positive.

## 2018-08-10 NOTE — ED Notes (Signed)
Date and time results received: 08/10/18 1927   Test: Hgb Critical Value: 5.3  Name of Provider Notified: [facility]  Orders Received? Or Actions Taken?: n/a

## 2018-08-10 NOTE — ED Provider Notes (Addendum)
[facility] Provider Note   CSN: [id number] Arrival date & time: 08/10/18  1801     History   Chief Complaint Chief Complaint  Patient presents with  . Wound Check    HPI Kara Santos is a 84 y.o. female.   Wound Check  This is a new problem. The current episode started more than 1 week ago. The problem has been gradually worsening. Nothing aggravates the symptoms. Nothing relieves the symptoms. She has tried nothing for the symptoms.  Weakness     Past Medical History:  Diagnosis Date  . Alzheimer's disease, early onset ([place]) 2015  . Anemia 11/07/2011   chronic IDA requiring multiple transfusions  . AVM (arteriovenous malformation) of stomach, acquired with hemorrhage 12/04/2013  . Closed fracture of greater trochanter of femur ([place]) 02/2013   left  . Depression   . Diabetes mellitus   . Fall at home 11/17/14   multiple rib fractures  . GERD (gastroesophageal reflux disease)   . Hypothyroidism    s/p thyroid surgery for thyroid cancer  . Iron deficiency anemia 05/05/2012    Patient Active Problem List   Diagnosis Date Noted  . Acute lower UTI 08/10/2018  . Gastrointestinal hemorrhage with melena 08/10/2018  . Sacral decubitus ulcer 08/10/2018  . Decubitus ulcer of heel 08/10/2018  . Lactic acidosis 08/10/2018  . Iron deficiency anemia due to chronic blood loss 05/18/2018  . Vaginal atrophy 05/18/2018  . PMB (postmenopausal bleeding) 05/18/2018  . Dementia without behavioral disturbance ([place]) 05/18/2018  . Protein-calorie malnutrition, severe 12/25/2017  . Dehydration   . Goals of care, counseling/discussion   . Palliative care by specialist   . Encounter for hospice care discussion   . AKI (acute kidney injury) ([place]) 12/23/2017  . GI bleed 08/07/2015  . Mandibular fracture, closed ([place]) 08/07/2015  . Acute encephalopathy 08/07/2015  . Hematochezia 08/07/2015  . Fall 11/20/2014  . Multiple rib fractures 11/17/2014  . AVM (arteriovenous  malformation) of stomach, acquired with hemorrhage 12/04/2013  . Closed fracture of greater trochanter of femur ([place]) 02/06/2013  . History of fall 02/06/2013  . Iron deficiency anemia 05/05/2012  . Acute blood loss anemia 03/04/2012  . Chronic gastrointestinal bleeding 03/04/2012  . Diabetes mellitus type 2 in nonobese ([place]) 03/04/2012  . Hypothyroidism 03/04/2012  . History of thyroid cancer 03/04/2012    Past Surgical History:  Procedure Laterality Date  . APPENDECTOMY    . bilateral cataract extractions    . BREAST REDUCTION SURGERY    . CHOLECYSTECTOMY    . COLONOSCOPY  08/2006   Dr. Cecelia Byars sided diverticulosis  . double balloon enteroscopy  06/1008   [facility]-->APC of 12 SB AVMs in jejunum. 6 feet of jejunum inspected  . ESOPHAGOGASTRODUODENOSCOPY  08/2006   Dr. Trevor Iha [facility], large duodenal diverticulum  . [place] small bowel capsule  08/2006   few gastric erosions, numerous small bowel AVMs with SB erosions  . right shoulder surgery    . THYROID SURGERY     thyroid cancer     OB History    Gravida  1   Para  1   Term      Preterm      AB      Living  1     SAB      TAB      Ectopic      Multiple      Live Births  Home Medications    Prior to Admission medications   Medication Sig Start Date End Date Taking? Authorizing Provider  acetaminophen (TYLENOL) 500 MG tablet Take 1,000 mg by mouth 3 (three) times daily.   Yes [provider]  benzocaine-resorcinol (VAGISIL) 5-2 % vaginal cream Place 2 g vaginally daily as needed for itching or irritation.    Yes [provider]  cyanocobalamin 100 MCG tablet Take 100 mcg by mouth daily.   Yes [provider]  ferrous sulfate 325 (65 FE) MG tablet Take 325 mg by mouth daily with breakfast.   Yes [provider]  furosemide (LASIX) 40 MG tablet Take 40 mg by mouth daily.  01/14/18  Yes [provider]  guaiFENesin-dextromethorphan  (ROBITUSSIN DM) 100-10 MG/5ML syrup Take 10 mLs by mouth every 6 (six) hours as needed for cough.   Yes [provider]  levothyroxine (SYNTHROID, LEVOTHROID) 25 MCG tablet Take 25 mcg by mouth daily before breakfast.  08/12/17  Yes [provider]  loperamide (IMODIUM A-D) 2 MG tablet Take 2 mg by mouth every 2 (two) hours as needed for diarrhea or loose stools.   Yes [provider]  meclizine (ANTIVERT) 25 MG tablet Take 25 mg by mouth 3 (three) times daily.   Yes [provider]  medroxyPROGESTERone (PROVERA) 2.5 MG tablet Take 1 tablet (2.5 mg total) by mouth daily. 06/29/18  Yes Florian Buff, MD  metFORMIN (GLUCOPHAGE-XR) 500 MG 24 hr tablet Take 500 mg by mouth daily with breakfast.    Yes [provider]  Multiple Vitamin (MULTIVITAMIN) tablet Take 1 tablet by mouth daily.   Yes [provider]  ondansetron (ZOFRAN) 4 MG tablet Take 4 mg by mouth 4 (four) times daily as needed for nausea or vomiting.   Yes [provider]  pantoprazole (PROTONIX) 40 MG tablet Take 40 mg by mouth daily.   Yes [provider]  potassium chloride SA (K-DUR,KLOR-CON) 20 MEQ tablet Take 20 mEq by mouth daily.  03/09/18  Yes [provider]  sertraline (ZOLOFT) 100 MG tablet Take 1 tablet by mouth daily. 12/05/17  Yes [provider]  traMADol (ULTRAM) 50 MG tablet Take 50 mg by mouth every 6 (six) hours as needed for moderate pain. Pain not relieved by Tylenol.   Yes [provider]    Family History Family History  Problem Relation Age of Onset  . Cancer Mother   . Cancer Father   . Diabetes Father   . Diabetes Sister   . Cancer Brother     Social History Social History   Tobacco Use  . Smoking status: Never Smoker  . Smokeless tobacco: Never Used  Substance Use Topics  . Alcohol use: No  . Drug use: No     Allergies   Asa [aspirin]   Review of Systems Review of Systems  Unable to perform ROS:  Dementia  Neurological: Positive for weakness.     Physical Exam Updated Vital Signs BP (!) 104/59   Pulse (!) 117   Temp 97.6 F (36.4 C) (Oral)   Resp 17   Ht 5\' 3"  (1.6 m)   Wt 33.8 kg   SpO2 100%   BMI 13.20 kg/m   Physical Exam  Constitutional: She appears well-developed and well-nourished.  HENT:  Head: Normocephalic and atraumatic.  Eyes:  Conjunctival pallor  Neck: Normal range of motion.  Cardiovascular: Regular rhythm. Tachycardia present.  Pulmonary/Chest: No stridor. No respiratory distress.  Abdominal: She exhibits  no distension.  Neurological: She is alert.  Skin:  Pressure ulcers:  Large blood blister on left heel 2x2 cm scabbed area to right heel.  Large pressure ulcer with mild surrounding cellulitis to sacrum  Nursing note and vitals reviewed.    ED Treatments / Results  Labs (all labs ordered are listed, but only abnormal results are displayed) Labs Reviewed  CBC WITH DIFFERENTIAL/PLATELET - Abnormal; Notable for the following components:      Result Value   WBC 18.3 (*)    RBC 2.19 (*)    Hemoglobin 5.3 (*)    HCT 20.4 (*)    MCH 24.2 (*)    MCHC 26.0 (*)    RDW 20.3 (*)    Neutro Abs 16.8 (*)    Abs Immature Granulocytes 0.17 (*)    All other components within normal limits  COMPREHENSIVE METABOLIC PANEL - Abnormal; Notable for the following components:   CO2 13 (*)    Glucose, Bld 320 (*)    BUN 40 (*)    Creatinine, Ser 1.24 (*)    Calcium 7.3 (*)    Total Protein 5.2 (*)    Albumin 2.1 (*)    GFR calc non Af Amer 35 (*)    GFR calc Af Amer 40 (*)    Anion gap 18 (*)    All other components within normal limits  LACTIC ACID, PLASMA - Abnormal; Notable for the following components:   Lactic Acid, Venous 13.8 (*)    All other components within normal limits  LACTIC ACID, PLASMA - Abnormal; Notable for the following components:   Lactic Acid, Venous 3.7 (*)    All other components within normal limits  POC OCCULT BLOOD, ED -  Abnormal; Notable for the following components:   Fecal Occult Bld POSITIVE (*)    All other components within normal limits  CBG MONITORING, ED - Abnormal; Notable for the following components:   Glucose-Capillary 324 (*)    All other components within normal limits  CULTURE, BLOOD (ROUTINE X 2)  CULTURE, BLOOD (ROUTINE X 2)  MRSA PCR SCREENING  CBC  BASIC METABOLIC PANEL  TYPE AND SCREEN  PREPARE RBC (CROSSMATCH)    EKG None  Radiology Dg Chest 1 View  Result Date: 08/10/2018 CLINICAL DATA:  Initial evaluation for acute tachycardia, weakness. EXAM: CHEST  1 VIEW COMPARISON:  Prior radiograph from 08/07/2015. FINDINGS: Cardiac and mediastinal silhouettes are stable in size and contour, and remain within normal limits. Patient markedly rotated to the right. Lungs hypoinflated. Asymmetric hazy opacity overlying the right upper lobe favored to be related to technique/patient positioning, although infiltrate not entirely excluded. No other focal airspace disease. No pulmonary edema or pleural effusion. No pneumothorax. Diffuse osteopenia.  Known left shoulder fracture noted. IMPRESSION: 1. Asymmetric hazy opacity overlying the right upper lobe, favored to be related to patient rotation/technique. Superimposed infiltrate not entirely excluded. Correlation with physical exam and symptomatology recommended. 2. Low lung volumes with no other active cardiopulmonary disease identified. Electronically Signed   By: Jeannine Boga M.D.   On: 08/10/2018 19:43   Dg Pelvis 1-2 Views  Result Date: 08/10/2018 CLINICAL DATA:  Initial evaluation for decubitus sacral wound. EXAM: PELVIS - 1-2 VIEW COMPARISON:  None. FINDINGS: No acute fracture or dislocation. Femoral heads in normal alignment within the acetabula. Bony pelvis intact. SI joints approximated. No pubic diastasis. Osteoarthritic changes noted about the hips bilaterally. Degenerative changes noted within lower lumbar spine. No acute soft  tissue abnormality. Known  sacral wound not well delineated. No radiographic evidence for active osteomyelitis. IMPRESSION: No acute osseous abnormality about the pelvis. Electronically Signed   By: Jeannine Boga M.D.   On: 08/10/2018 19:45    Procedures .Critical Care Performed by: Merrily Pew, MD Authorized by: Merrily Pew, MD   Critical care provider statement:    Critical care time (minutes):  45   Critical care was time spent personally by me on the following activities:  Discussions with consultants, evaluation of patient's response to treatment, examination of patient, ordering and performing treatments and interventions, ordering and review of laboratory studies, ordering and review of radiographic studies, pulse oximetry, re-evaluation of patient's condition, obtaining history from patient or surrogate and review of old charts   (including critical care time)  Medications Ordered in ED Medications  insulin aspart (novoLOG) injection 0-9 Units (7 Units Subcutaneous Given 08/10/18 2058)  pantoprazole (PROTONIX) 80 mg in sodium chloride 0.9 % 100 mL IVPB (80 mg Intravenous New Bag/Given 08/10/18 2200)  pantoprazole (PROTONIX) 80 mg in sodium chloride 0.9 % 250 mL (0.32 mg/mL) infusion (has no administration in time range)  pantoprazole (PROTONIX) injection 40 mg (has no administration in time range)  cefTRIAXone (ROCEPHIN) 1 g in sodium chloride 0.9 % 100 mL IVPB (has no administration in time range)  acetaminophen (TYLENOL) tablet 650 mg (has no administration in time range)    Or  acetaminophen (TYLENOL) suppository 650 mg (has no administration in time range)  ondansetron (ZOFRAN) tablet 4 mg (has no administration in time range)    Or  ondansetron (ZOFRAN) injection 4 mg (has no administration in time range)  vancomycin (VANCOCIN) 500 mg in sodium chloride 0.9 % 100 mL IVPB (500 mg Intravenous New Bag/Given 08/10/18 2155)  lactated ringers bolus 1,000 mL (0 mLs  Intravenous Stopped 08/10/18 2021)  0.9 %  sodium chloride infusion (10 mL/hr Intravenous New Bag/Given 08/10/18 2026)  cefTRIAXone (ROCEPHIN) 1 g in sodium chloride 0.9 % 100 mL IVPB (0 g Intravenous Stopped 08/10/18 2100)    Initial Impression / Assessment and Plan / ED Course  I have reviewed the triage vital signs and the nursing notes.  Pertinent labs & imaging results that were available during my care of the patient were reviewed by me and considered in my medical decision making (see chart for details).  Patient with multiple ulcers worse from being on her sacrum however does not appear to be infected.  She does have melena and hemoglobin of 5.3 subsequently found to have elevated lactic acid as well.  Blood started in the emergency room.  Had a diagnosed urinary tract infection in the system from yesterday with culture pending so did not repeat and started on Rocephin.  Confirm DNR/DNI with daughter who is her power of attorney.  Does not think that the patient would do well with a colonoscopy if needed and wants to make that decision later when more stable.  I suspect lactic acidemia is related to acute blood loss anema and hypoperfusion rather than sepsis. Discussed with hospitalist who will admit.  Final Clinical Impressions(s) / ED Diagnoses   Final diagnoses:  Symptomatic anemia  Wound of sacral region, initial encounter  Melena  Dementia without behavioral disturbance, unspecified dementia type [facility])    ED Discharge Orders    None       Ziah Turvey, Corene Cornea, MD 08/10/18 2204    Merrily Pew, MD 08/10/18 2205

## 2018-08-10 NOTE — ED Notes (Signed)
Phlebotomy at bedside.

## 2018-08-10 NOTE — ED Notes (Signed)
CRITICAL VALUE ALERT  Critical Value:  Lactic Acid 13.8  Date & Time Notied:  08/10/18 2023  Provider Notified: Dr. Dayna Barker  Orders Received/Actions taken: n/a

## 2018-08-10 NOTE — H&P (Signed)
History and Physical    Kara Santos XBM:[id number] DOB: June 17, 1917 DOA: 08/10/2018  PCP: Sinda Du, MD  Patient coming from: SNF  I have personally briefly reviewed patient's old medical records in [place]  Chief Complaint: Pressure ulcers  HPI: Kara Santos is a 84 y.o. female with medical history significant of dementia, recent shoulder fx, now bed bound, DM2.  Patient presents to the ED from SNF for evaluation of large pressure wounds on R heel and sacrum.  Pt diagnosed with UTI today but not begun cipro yet.  Facility also reports patient with no PO intake in past 2 weeks.   ED Course: UTI, HGB 5.3, patient with melena, large pressure ulcers. Lactate 13.8!  Bun 40, creat 1.24.  WBC 18.3k.  Given 1L LR, started on rocephin.  Despite all this, vitals OKAY at the moment with HR 117, BP 123/66, and Tm 98.8.  EDP spoke with daughter: confirmed DNR/DNI status, they do want blood transfusion and ABx, they are not sure if they want endoscopy or not.   Review of Systems: Unable to perform due to dementia.  Past Medical History:  Diagnosis Date  . Alzheimer's disease, early onset ([place]) 2015  . Anemia 11/07/2011   chronic IDA requiring multiple transfusions  . AVM (arteriovenous malformation) of stomach, acquired with hemorrhage 12/04/2013  . Closed fracture of greater trochanter of femur ([place]) 02/2013   left  . Depression   . Diabetes mellitus   . Fall at home 11/17/14   multiple rib fractures  . GERD (gastroesophageal reflux disease)   . Hypothyroidism    s/p thyroid surgery for thyroid cancer  . Iron deficiency anemia 05/05/2012    Past Surgical History:  Procedure Laterality Date  . APPENDECTOMY    . bilateral cataract extractions    . BREAST REDUCTION SURGERY    . CHOLECYSTECTOMY    . COLONOSCOPY  08/2006   Dr. Cecelia Byars sided diverticulosis  . double balloon enteroscopy  06/1008   [facility]-->APC of 12 SB AVMs in jejunum. 6 feet of jejunum inspected  .  ESOPHAGOGASTRODUODENOSCOPY  08/2006   Dr. Trevor Iha [facility], large duodenal diverticulum  . [place] small bowel capsule  08/2006   few gastric erosions, numerous small bowel AVMs with SB erosions  . right shoulder surgery    . THYROID SURGERY     thyroid cancer     reports that she has never smoked. She has never used smokeless tobacco. She reports that she does not drink alcohol or use drugs.  Allergies  Allergen Reactions  . Asa [Aspirin]     Acid reflux     Family History  Problem Relation Age of Onset  . Cancer Mother   . Cancer Father   . Diabetes Father   . Diabetes Sister   . Cancer Brother      Prior to Admission medications   Medication Sig Start Date End Date Taking? Authorizing Provider  acetaminophen (TYLENOL) 500 MG tablet Take 1,000 mg by mouth 3 (three) times daily.    [provider]  azithromycin (ZITHROMAX) 250 MG tablet 250 mg daily.  07/06/18   [provider]  benzocaine-resorcinol (VAGISIL) 5-2 % vaginal cream Place 2 g vaginally daily as needed for itching or irritation.     [provider]  benzonatate (TESSALON) 200 MG capsule Take 200 mg by mouth 3 (three) times daily as needed for cough.    [provider]  cyanocobalamin 100 MCG tablet Take 100 mcg by mouth  daily.    [provider]  ferrous sulfate 325 (65 FE) MG tablet Take 325 mg by mouth daily with breakfast.    [provider]  furosemide (LASIX) 40 MG tablet Take 40 mg by mouth daily.  01/14/18   [provider]  guaiFENesin-dextromethorphan (ROBITUSSIN DM) 100-10 MG/5ML syrup Take 10 mLs by mouth every 6 (six) hours as needed for cough.    [provider]  levothyroxine (SYNTHROID, LEVOTHROID) 25 MCG tablet Take 25 mcg by mouth daily before breakfast.  08/12/17   [provider]  loperamide (IMODIUM A-D) 2 MG tablet Take 2 mg by mouth every 2 (two) hours as needed for diarrhea or loose stools.    [provider]  meclizine (ANTIVERT) 25 MG tablet Take 25 mg by mouth 3 (three) times daily.    [provider]  medroxyPROGESTERone (PROVERA) 2.5 MG tablet Take 1 tablet (2.5 mg total) by mouth daily. 06/29/18   Florian Buff, MD  metFORMIN (GLUCOPHAGE-XR) 500 MG 24 hr tablet Take 500 mg by mouth daily with breakfast.     [provider]  Multiple Vitamin (MULTIVITAMIN) tablet Take 1 tablet by mouth daily.    [provider]  ondansetron (ZOFRAN) 4 MG tablet Take 4 mg by mouth 4 (four) times daily as needed for nausea or vomiting.    [provider]  pantoprazole (PROTONIX) 40 MG tablet Take 40 mg by mouth daily.    [provider]  potassium chloride SA (K-DUR,KLOR-CON) 20 MEQ tablet Take 20 mEq by mouth daily.  03/09/18   [provider]  SANTYL ointment  08/01/18   [provider]  sertraline (ZOLOFT) 100 MG tablet Take 1 tablet by mouth daily. 12/05/17   [provider]  traMADol (ULTRAM) 50 MG tablet Take 50 mg by mouth every 6 (six) hours as needed for moderate pain. Pain not relieved by Tylenol.    [provider]    Physical Exam: Vitals:   08/10/18 1830 08/10/18 1900 08/10/18 1930 08/10/18 2000  BP: 117/77 116/71 120/71 123/66  Pulse:      Resp: (!) 23 19 18 16   Temp:      TempSrc:      SpO2:      Weight:        Constitutional: NAD, calm, comfortable, emaciated with muscle wasting Eyes: PERRL, lids and conjunctivae normal ENMT: Mucous membranes are moist. Posterior pharynx clear of any exudate or lesions.Normal dentition.  Neck: normal, supple, no masses, no thyromegaly Respiratory: clear to auscultation bilaterally, no wheezing, no crackles. Normal respiratory effort. No accessory muscle use.  Cardiovascular: Tachycardic, regular Abdomen: no tenderness, no masses palpated. No hepatosplenomegaly. Bowel sounds positive.  Musculoskeletal: R Shoulder in immobilizer Skin: Sacral and heel decubitus with surrounding  erythema Neurologic: CN 2-12 grossly intact. Sensation intact, DTR normal. Strength 5/5 in all 4.  Psychiatric: Demented   Labs on Admission: I have personally reviewed following labs and imaging studies  CBC: Recent Labs  Lab 08/10/18 1850  WBC 18.3*  NEUTROABS 16.8*  HGB 5.3*  HCT 20.4*  MCV 93.2  PLT 122   Basic Metabolic Panel: Recent Labs  Lab 08/10/18 1850  NA 141  K 3.7  CL 110  CO2 13*  GLUCOSE 320*  BUN 40*  CREATININE 1.24*  CALCIUM 7.3*   GFR: Estimated Creatinine Clearance: 16 mL/min (A) (by C-G formula based on SCr of 1.24 mg/dL (H)). Liver Function Tests: Recent Labs  Lab 08/10/18 1850  AST  17  ALT 12  ALKPHOS 99  BILITOT 0.5  PROT 5.2*  ALBUMIN 2.1*   No results for input(s): LIPASE, AMYLASE in the last 168 hours. No results for input(s): AMMONIA in the last 168 hours. Coagulation Profile: No results for input(s): INR, PROTIME in the last 168 hours. Cardiac Enzymes: No results for input(s): CKTOTAL, CKMB, CKMBINDEX, TROPONINI in the last 168 hours. BNP (last 3 results) No results for input(s): PROBNP in the last 8760 hours. HbA1C: No results for input(s): HGBA1C in the last 72 hours. CBG: No results for input(s): GLUCAP in the last 168 hours. Lipid Profile: No results for input(s): CHOL, HDL, LDLCALC, TRIG, CHOLHDL, LDLDIRECT in the last 72 hours. Thyroid Function Tests: No results for input(s): TSH, T4TOTAL, FREET4, T3FREE, THYROIDAB in the last 72 hours. Anemia Panel: No results for input(s): VITAMINB12, FOLATE, FERRITIN, TIBC, IRON, RETICCTPCT in the last 72 hours. Urine analysis:    Component Value Date/Time   COLORURINE YELLOW 08/09/2018 0745   APPEARANCEUR HAZY (A) 08/09/2018 0745   LABSPEC 1.015 08/09/2018 0745   PHURINE 5.0 08/09/2018 0745   GLUCOSEU NEGATIVE 08/09/2018 0745   HGBUR MODERATE (A) 08/09/2018 0745   BILIRUBINUR NEGATIVE 08/09/2018 0745   KETONESUR NEGATIVE 08/09/2018 0745   PROTEINUR NEGATIVE 08/09/2018 0745    UROBILINOGEN 0.2 08/07/2015 1235   NITRITE NEGATIVE 08/09/2018 0745   LEUKOCYTESUR MODERATE (A) 08/09/2018 0745    Radiological Exams on Admission: Dg Chest 1 View  Result Date: 08/10/2018 CLINICAL DATA:  Initial evaluation for acute tachycardia, weakness. EXAM: CHEST  1 VIEW COMPARISON:  Prior radiograph from 08/07/2015. FINDINGS: Cardiac and mediastinal silhouettes are stable in size and contour, and remain within normal limits. Patient markedly rotated to the right. Lungs hypoinflated. Asymmetric hazy opacity overlying the right upper lobe favored to be related to technique/patient positioning, although infiltrate not entirely excluded. No other focal airspace disease. No pulmonary edema or pleural effusion. No pneumothorax. Diffuse osteopenia.  Known left shoulder fracture noted. IMPRESSION: 1. Asymmetric hazy opacity overlying the right upper lobe, favored to be related to patient rotation/technique. Superimposed infiltrate not entirely excluded. Correlation with physical exam and symptomatology recommended. 2. Low lung volumes with no other active cardiopulmonary disease identified. Electronically Signed   By: Jeannine Boga M.D.   On: 08/10/2018 19:43   Dg Pelvis 1-2 Views  Result Date: 08/10/2018 CLINICAL DATA:  Initial evaluation for decubitus sacral wound. EXAM: PELVIS - 1-2 VIEW COMPARISON:  None. FINDINGS: No acute fracture or dislocation. Femoral heads in normal alignment within the acetabula. Bony pelvis intact. SI joints approximated. No pubic diastasis. Osteoarthritic changes noted about the hips bilaterally. Degenerative changes noted within lower lumbar spine. No acute soft tissue abnormality. Known sacral wound not well delineated. No radiographic evidence for active osteomyelitis. IMPRESSION: No acute osseous abnormality about the pelvis. Electronically Signed   By: Jeannine Boga M.D.   On: 08/10/2018 19:45    EKG: Independently  reviewed.  Assessment/Plan Principal Problem:   Gastrointestinal hemorrhage with melena Active Problems:   Acute blood loss anemia   Diabetes mellitus type 2 in nonobese (HCC)   Dementia without behavioral disturbance (HCC)   Acute lower UTI   Sacral decubitus ulcer   Decubitus ulcer of heel   Lactic acidosis    1. GIB with melena and acute blood loss anemia - 1. 3u PRBC transfusion ordered 2. PPI GTT 3. GI eval in AM 4. Clear liquid diet 5. Repeat CBC in AM 2. Lactic acidosis - 1. Severe with lactate of  13.8 2. Likely malperfusion due to anemia 3. BP being maintained 604V systolic 4. Trying to transfuse 5. Serial lactates 6. Let daughter know that patient may not survive the night and may decompensate quickly with lactate 13.8 and age 69, she is coming to visit patient. 3. UTI - 1. Rocephin 2. Culture pending 4. Sacral and heel decubitus with surrounding cellulitis 1. Given WBC, h/o MRSA+ nares, very sick patient with lactate 13.8.  Will add empiric vanc 2. Wound care consult 5. DM2 - 1. SSI sensitive Q4H 6. Advanced dementia - bedbound, no PO intake for 2 weeks, now BMI 16.5... 1. Essentially also has adult failure to thrive I think. 2. Pal care consult placed for AM, if patient survives the night.  DVT prophylaxis: SCDs Code Status: DNR/DNI Family Communication: EDP spoke with daughter Disposition Plan: SNF after admit Consults called: GI, PAL care, wound care Admission status: Admit to inpatient  Severity of Illness: The appropriate patient status for this patient is INPATIENT. Inpatient status is judged to be reasonable and necessary in order to provide the required intensity of service to ensure the patient's safety. The patient's presenting symptoms, physical exam findings, and initial radiographic and laboratory data in the context of their chronic comorbidities is felt to place them at high risk for further clinical deterioration. Furthermore, it is not  anticipated that the patient will be medically stable for discharge from the hospital within 2 midnights of admission. The following factors support the patient status of inpatient.   " The patient's presenting symptoms include large pressure ulcers, melena. " The worrisome physical exam findings include melena, large pressure ulcers. " The initial radiographic and laboratory data are worrisome because of UTI, melena, large pressure ulcers, lactate of 13.8, HGB 5.5. " The chronic co-morbidities include dementia, bed bound.   * I certify that at the point of admission it is my clinical judgment that the patient will require inpatient hospital care spanning beyond 2 midnights from the point of admission due to high intensity of service, high risk for further deterioration and high frequency of surveillance required.Etta Quill DO [facility] Pager [phone] Only works nights!  If 7AM-7PM, please contact the primary day team physician taking care of patient  [email or web address] Password [facility]  08/10/2018, 8:32 PM

## 2018-08-10 NOTE — ED Notes (Signed)
Elmyra Ricks (home health RN from [facility]) [phone]

## 2018-08-10 NOTE — Progress Notes (Signed)
Pharmacy Antibiotic Note  Kara Santos is a 84 y.o. female admitted on 08/10/2018 with cellulitis.  Pharmacy has been consulted for vancomycin dosing.  Plan: Vancomycin 500mg  IV every 48 hours.  Goal trough 10-15 mcg/mL.  Weight: 90 lb 6.2 oz (41 kg)  Temp (24hrs), Avg:98.8 F (37.1 C), Min:98.8 F (37.1 C), Max:98.8 F (37.1 C)  Recent Labs  Lab 08/10/18 1850  WBC 18.3*  CREATININE 1.24*  LATICACIDVEN 13.8*    Estimated Creatinine Clearance: 16 mL/min (A) (by C-G formula based on SCr of 1.24 mg/dL (H)).    Allergies  Allergen Reactions  . Asa [Aspirin]     Acid reflux     Antimicrobials this admission: 11/5 vancomycin >>  11/5 ceftriaxone >>    Microbiology results: 11/5 BCx: sent  Thank you for allowing pharmacy to be a part of this patient's care.  Donna Christen Yahye Siebert 08/10/2018 8:48 PM

## 2018-08-10 NOTE — ED Triage Notes (Signed)
Pt here from [facility] for evaluation of pressure wounds on RT heel and on sacrum. States that today, sacral wound began having foul smelling discharge. Pt diagnosed with UTI today, but has not began Cipro yet. Facility also reported pt has not eaten in two weeks, but has been tolerating fluids. Pt arrived with shoulder immobilizer due to a humeral head fracture that she is currently receiving PT for.

## 2018-08-11 DIAGNOSIS — R627 Adult failure to thrive: Secondary | ICD-10-CM

## 2018-08-11 DIAGNOSIS — Z7189 Other specified counseling: Secondary | ICD-10-CM

## 2018-08-11 DIAGNOSIS — Z515 Encounter for palliative care: Secondary | ICD-10-CM

## 2018-08-11 LAB — BASIC METABOLIC PANEL
ANION GAP: 14 (ref 5–15)
BUN: 37 mg/dL — ABNORMAL HIGH (ref 8–23)
CHLORIDE: 119 mmol/L — AB (ref 98–111)
CO2: 19 mmol/L — ABNORMAL LOW (ref 22–32)
Calcium: 7.6 mg/dL — ABNORMAL LOW (ref 8.9–10.3)
Creatinine, Ser: 1.18 mg/dL — ABNORMAL HIGH (ref 0.44–1.00)
GFR calc Af Amer: 43 mL/min — ABNORMAL LOW (ref >60)
GFR, EST NON AFRICAN AMERICAN: 37 mL/min — AB (ref >60)
Glucose, Bld: 113 mg/dL — ABNORMAL HIGH (ref 70–99)
POTASSIUM: 2.9 mmol/L — AB (ref 3.5–5.1)
SODIUM: 152 mmol/L — AB (ref 135–145)

## 2018-08-11 LAB — PROTIME-INR
INR: 1.21
PROTHROMBIN TIME: 15.2 s (ref 11.4–15.2)

## 2018-08-11 LAB — CBC
HCT: 44.4 % (ref 36.0–46.0)
HEMOGLOBIN: 13.4 g/dL (ref 12.0–15.0)
MCH: 27.7 pg (ref 26.0–34.0)
MCHC: 30.2 g/dL (ref 30.0–36.0)
MCV: 91.7 fL (ref 80.0–100.0)
NRBC: 0.1 % (ref 0.0–0.2)
PLATELETS: 255 10*3/uL (ref 150–400)
RBC: 4.84 MIL/uL (ref 3.87–5.11)
RDW: 17.4 % — ABNORMAL HIGH (ref 11.5–15.5)
WBC: 21 10*3/uL — ABNORMAL HIGH (ref 4.0–10.5)

## 2018-08-11 LAB — GLUCOSE, CAPILLARY
GLUCOSE-CAPILLARY: 239 mg/dL — AB (ref 70–99)
GLUCOSE-CAPILLARY: 98 mg/dL (ref 70–99)
Glucose-Capillary: 174 mg/dL — ABNORMAL HIGH (ref 70–99)
Glucose-Capillary: 165 mg/dL — ABNORMAL HIGH (ref 70–99)
Glucose-Capillary: 119 mg/dL — ABNORMAL HIGH (ref 70–99)

## 2018-08-11 LAB — PREPARE RBC (CROSSMATCH)

## 2018-08-11 LAB — MRSA PCR SCREENING: MRSA by PCR: POSITIVE — AB

## 2018-08-11 LAB — LACTIC ACID, PLASMA: LACTIC ACID, VENOUS: 5.8 mmol/L — AB (ref 0.5–1.9)

## 2018-08-11 MED ORDER — INFLUENZA VAC SPLIT HIGH-DOSE 0.5 ML IM SUSY
0.5000 mL | PREFILLED_SYRINGE | INTRAMUSCULAR | Status: DC
  Filled 2018-08-11: qty 0.5

## 2018-08-11 MED ORDER — CHLORHEXIDINE GLUCONATE CLOTH 2 % EX PADS
6.0000 | MEDICATED_PAD | Freq: Every day | CUTANEOUS | Status: DC
  Administered 2018-08-11 – 2018-08-13 (×3): 6 via TOPICAL

## 2018-08-11 MED ORDER — MUPIROCIN 2 % EX OINT
1.0000 "application " | TOPICAL_OINTMENT | Freq: Two times a day (BID) | CUTANEOUS | Status: DC
  Administered 2018-08-11 – 2018-08-13 (×5): 1 via NASAL
  Filled 2018-08-11: qty 22

## 2018-08-11 MED ORDER — SODIUM CHLORIDE 0.9 % IV SOLN
INTRAVENOUS | Status: DC | PRN
  Administered 2018-08-11: 1000 mL via INTRAVENOUS

## 2018-08-11 MED ORDER — PANTOPRAZOLE SODIUM 40 MG IV SOLR
INTRAVENOUS | Status: AC
  Filled 2018-08-11: qty 80

## 2018-08-11 MED ORDER — SODIUM CHLORIDE 0.9 % IV BOLUS: 500.0000 mL | Freq: Once | INTRAVENOUS | Status: DC

## 2018-08-11 NOTE — Progress Notes (Signed)
PROGRESS NOTE    Kara Santos  OQH:[id number] DOB: Jul 21, 1917 DOA: 08/10/2018 PCP: Sinda Du, MD   Brief Narrative:  Per HPI from Dr. Alcario Drought on 11/5: Kara Santos is a 84 y.o. female with medical history significant of dementia, recent shoulder fx, now bed bound, DM2.  Patient presents to the ED from SNF for evaluation of large pressure wounds on R heel and sacrum.  Pt diagnosed with UTI today but not begun cipro yet.  Facility also reports patient with no PO intake in past 2 weeks.  Patient was noted to have severe, acute blood loss anemia and is currently receiving 3 units of PRBC and is also on PPI infusion.  Additionally, she was noted to have severe lactic acidosis as well as UTI.  She is also noted to have surrounding cellulitis to the sacral and heel ulcers and as a result has been started on vancomycin as well as Rocephin for UTI.   Assessment & Plan:   Principal Problem:   Gastrointestinal hemorrhage with melena Active Problems:   Acute blood loss anemia   Diabetes mellitus type 2 in nonobese (HCC)   Dementia without behavioral disturbance (HCC)   Acute lower UTI   Sacral decubitus ulcer   Decubitus ulcer of heel   Lactic acidosis   1. Acute blood loss anemia secondary to recurrent GI bleed.  Patient has supposedly had prior endoscopies demonstrating friable blood vessels and has required repeat transfusions in the past.  We will plan to continue transfusion with 3 units PRBC as ordered and follow-up H&H with repeat CBC in a.m.  Maintain on clear liquid diet as tolerated.  Cancel GI consultation as daughter does not want any aggressive measures to include endoscopy.  Likely poor prognosis for survival and patient is DNR.  Appreciate palliative care involvement.  May transition to comfort measures and hospice soon. 2. Severe lactic acidosis-downtrending.  Likely secondary to above.  Continue to monitor with repeat lactic acid in a.m. 3. UTI.  Continue to monitor urine  culture and maintain on Rocephin. 4. Sacral and heel decubitus ulcers with surrounding cellulitis.  Continue empiric vancomycin and appreciate wound care consultation will be focused on stability of the wounds and no active debridement. 5. Advanced dementia-bedbound.  She is presenting as a failure to thrive.  Appreciate palliative care involvement. 6. Type 2 diabetes.  She was noted to be quite hyperglycemic on admission but recent blood glucose is 239.  Maintain on sensitive scale SSI every 4 hours as patient has poor oral intake in general.  Continue to monitor and increase aggressiveness as needed.   DVT prophylaxis: SCDs Code Status: DNR Family Communication: Spoke with daughter, [name], extensively this morning Disposition Plan: Plan to likely transition to comfort measures.  Continue current antibiotics and blood transfusion and monitor progress.  Appreciate palliative care consultation.   Consultants:   Wound care  Palliative care  Procedures:   None  Antimicrobials:   Vancomycin and Rocephin 11/5->   Subjective: Patient seen and evaluated today and appears quite somnolent and confused.  Daughter, [name] is at bedside.  Objective: Vitals:   08/11/18 0830 08/11/18 0845 08/11/18 0900 08/11/18 0907  BP: 95/72  108/80 96/69  Pulse:  (!) 113 (!) 114 (!) 111  Resp: 16 19 (!) 22 18  Temp:  (!) 97.4 F (36.3 C)  97.7 F (36.5 C)  TempSrc:  Oral  Axillary  SpO2:  100% 96% 100%  Weight:      Height:  Intake/Output Summary (Last 24 hours) at 08/11/2018 0918 Last data filed at 08/11/2018 0800 Gross per 24 hour  Intake 2302.83 ml  Output -  Net 2302.83 ml   Filed Weights   08/10/18 1805 08/10/18 2156 08/11/18 0343  Weight: 41 kg 33.8 kg 34 kg    Examination:  General exam: Somnolent Respiratory system: Clear to auscultation. Respiratory effort normal. Cardiovascular system: S1 & S2 heard, RRR. No JVD, murmurs, rubs, gallops or clicks. No pedal  edema. Gastrointestinal system: Abdomen is nondistended, soft and nontender. No organomegaly or masses felt. Normal bowel sounds heard. Central nervous system: Somnolent Extremities: Symmetric 5 x 5 power.  Left arm in sling. Skin: No rashes, lesions or ulcers Psychiatry: Cannot be assessed.    Data Reviewed: I have personally reviewed following labs and imaging studies  CBC: Recent Labs  Lab 08/10/18 1850  WBC 18.3*  NEUTROABS 16.8*  HGB 5.3*  HCT 20.4*  MCV 93.2  PLT 767   Basic Metabolic Panel: Recent Labs  Lab 08/10/18 1850  NA 141  K 3.7  CL 110  CO2 13*  GLUCOSE 320*  BUN 40*  CREATININE 1.24*  CALCIUM 7.3*   GFR: Estimated Creatinine Clearance: 13.3 mL/min (A) (by C-G formula based on SCr of 1.24 mg/dL (H)). Liver Function Tests: Recent Labs  Lab 08/10/18 1850  AST 17  ALT 12  ALKPHOS 99  BILITOT 0.5  PROT 5.2*  ALBUMIN 2.1*   No results for input(s): LIPASE, AMYLASE in the last 168 hours. No results for input(s): AMMONIA in the last 168 hours. Coagulation Profile: No results for input(s): INR, PROTIME in the last 168 hours. Cardiac Enzymes: No results for input(s): CKTOTAL, CKMB, CKMBINDEX, TROPONINI in the last 168 hours. BNP (last 3 results) No results for input(s): PROBNP in the last 8760 hours. HbA1C: No results for input(s): HGBA1C in the last 72 hours. CBG: Recent Labs  Lab 08/10/18 2043 08/10/18 2334 08/11/18 0337 08/11/18 0733  GLUCAP 324* 80 174* 239*   Lipid Profile: No results for input(s): CHOL, HDL, LDLCALC, TRIG, CHOLHDL, LDLDIRECT in the last 72 hours. Thyroid Function Tests: No results for input(s): TSH, T4TOTAL, FREET4, T3FREE, THYROIDAB in the last 72 hours. Anemia Panel: No results for input(s): VITAMINB12, FOLATE, FERRITIN, TIBC, IRON, RETICCTPCT in the last 72 hours. Sepsis Labs: Recent Labs  Lab 08/10/18 1850 08/10/18 2017 08/10/18 2351  LATICACIDVEN 13.8* 3.7* 5.8*    Recent Results (from the past 240  hour(s))  Culture, Urine     Status: None (Preliminary result)   Collection Time: 08/09/18  7:45 AM  Result Value Ref Range Status   Specimen Description   Final    URINE, CLEAN CATCH Performed at [facility], [address] [place], [place] [address]    Special Requests   Final    NONE Performed at [facility], [address] [place], [place] [address]    Culture   Final    CULTURE REINCUBATED FOR BETTER GROWTH Performed at [facility], [place] [address] [place],  [address]    Report Status PENDING  Incomplete  Culture, blood (routine x 2)     Status: None (Preliminary result)   Collection Time: 08/10/18  8:17 PM  Result Value Ref Range Status   Specimen Description BLOOD RIGHT FOREARM  Final   Special Requests   Final    BOTTLES DRAWN AEROBIC AND ANAEROBIC Blood Culture adequate volume   Culture   Final    NO GROWTH < 12 HOURS  Performed at [facility], [address], [place], [place] [address]    Report Status PENDING  Incomplete  MRSA PCR Screening     Status: Abnormal   Collection Time: 08/10/18  9:23 PM  Result Value Ref Range Status   MRSA by PCR POSITIVE (A) NEGATIVE Final    Comment:        The GeneXpert MRSA Assay (FDA approved for NASAL specimens only), is one component of a comprehensive MRSA colonization surveillance program. It is not intended to diagnose MRSA infection nor to guide or monitor treatment for MRSA infections. RESULT CALLED TO, READ BACK BY AND VERIFIED WITH: NURSE JESSICA AT 0458 ON 08/11/2018 BY [name] Performed at [facility], [address], [place], [place] [address]   Culture, blood (routine x 2)     Status: None (Preliminary result)   Collection Time: 08/10/18 11:37 PM  Result Value Ref Range Status   Specimen Description BLOOD LEFT FOREARM  Final   Special Requests   Final    BOTTLES DRAWN AEROBIC AND ANAEROBIC Blood Culture adequate volume   Culture   Final    NO GROWTH < 12 HOURS Performed at [facility],  [address], [place], [place] [address]    Report Status PENDING  Incomplete         Radiology Studies: Dg Chest 1 View  Result Date: 08/10/2018 CLINICAL DATA:  Initial evaluation for acute tachycardia, weakness. EXAM: CHEST  1 VIEW COMPARISON:  Prior radiograph from 08/07/2015. FINDINGS: Cardiac and mediastinal silhouettes are stable in size and contour, and remain within normal limits. Patient markedly rotated to the right. Lungs hypoinflated. Asymmetric hazy opacity overlying the right upper lobe favored to be related to technique/patient positioning, although infiltrate not entirely excluded. No other focal airspace disease. No pulmonary edema or pleural effusion. No pneumothorax. Diffuse osteopenia.  Known left shoulder fracture noted. IMPRESSION: 1. Asymmetric hazy opacity overlying the right upper lobe, favored to be related to patient rotation/technique. Superimposed infiltrate not entirely excluded. Correlation with physical exam and symptomatology recommended. 2. Low lung volumes with no other active cardiopulmonary disease identified. Electronically Signed   By: Jeannine Boga M.D.   On: 08/10/2018 19:43   Dg Pelvis 1-2 Views  Result Date: 08/10/2018 CLINICAL DATA:  Initial evaluation for decubitus sacral wound. EXAM: PELVIS - 1-2 VIEW COMPARISON:  None. FINDINGS: No acute fracture or dislocation. Femoral heads in normal alignment within the acetabula. Bony pelvis intact. SI joints approximated. No pubic diastasis. Osteoarthritic changes noted about the hips bilaterally. Degenerative changes noted within lower lumbar spine. No acute soft tissue abnormality. Known sacral wound not well delineated. No radiographic evidence for active osteomyelitis. IMPRESSION: No acute osseous abnormality about the pelvis. Electronically Signed   By: Jeannine Boga M.D.   On: 08/10/2018 19:45        Scheduled Meds: . Chlorhexidine Gluconate Cloth  6 each Topical Q0600  . [START ON 08/12/2018]  Influenza vac split quadrivalent PF  0.5 mL Intramuscular Tomorrow-1000  . insulin aspart  0-9 Units Subcutaneous Q4H  . mupirocin ointment  1 application Nasal BID  . [START ON 08/14/2018] pantoprazole  40 mg Intravenous Q12H   Continuous Infusions: . cefTRIAXone (ROCEPHIN)  IV    . pantoprozole (PROTONIX) infusion 8 mg/hr (08/11/18 0854)  . vancomycin Stopped (08/10/18 2256)     LOS: 1 day    Time spent: 30 minutes    Jhoan Schmieder Darleen Crocker, DO [facility] Pager [phone]  If 7PM-7AM, please contact night-coverage [email or web address] Password  [email or web address] 08/11/2018, 9:18 AM

## 2018-08-11 NOTE — Progress Notes (Addendum)
Curbsided PCCM, will go ahead and do 500cc NS bolus to try and volume expand and see if this helps lactate and hope that we dont dilute her too much.  Update: never mind the NS, just informed that blood is available for transfusion!

## 2018-08-11 NOTE — Evaluation (Signed)
Clinical/Bedside Swallow Evaluation Patient Details  Name: Kara Santos MRN: [id number] Date of Birth: 10/23/1916  Today's Date: 08/11/2018 Time: SLP Start Time (ACUTE ONLY): 3500 SLP Stop Time (ACUTE ONLY): 9381 SLP Time Calculation (min) (ACUTE ONLY): 23 min  Past Medical History:  Past Medical History:  Diagnosis Date  . Alzheimer's disease, early onset ([place]) 2015  . Anemia 11/07/2011   chronic IDA requiring multiple transfusions  . AVM (arteriovenous malformation) of stomach, acquired with hemorrhage 12/04/2013  . Closed fracture of greater trochanter of femur ([place]) 02/2013   left  . Depression   . Diabetes mellitus   . Fall at home 11/17/14   multiple rib fractures  . GERD (gastroesophageal reflux disease)   . Hypothyroidism    s/p thyroid surgery for thyroid cancer  . Iron deficiency anemia 05/05/2012   Past Surgical History:  Past Surgical History:  Procedure Laterality Date  . APPENDECTOMY    . bilateral cataract extractions    . BREAST REDUCTION SURGERY    . CHOLECYSTECTOMY    . COLONOSCOPY  08/2006   Dr. Cecelia Byars sided diverticulosis  . double balloon enteroscopy  06/1008   [facility]-->APC of 12 SB AVMs in jejunum. 6 feet of jejunum inspected  . ESOPHAGOGASTRODUODENOSCOPY  08/2006   Dr. Trevor Iha [facility], large duodenal diverticulum  . G[place] small bowel capsule  08/2006   few gastric erosions, numerous small bowel AVMs with SB erosions  . right shoulder surgery    . THYROID SURGERY     thyroid cancer   HPI:  Kara Santos is a 84 y.o. female with medical history significant of dementia, recent shoulder fx, now bed bound, DM2.  Patient presents to the ED from SNF for evaluation of large pressure wounds on R heel and sacrum.  Pt diagnosed with UTI today but not begun cipro yet.  Facility also reports patient with no PO intake in past 2 weeks. BSE requested due to coughing noted with breakfast meal and limited po intake noted at ALF prior to admission.    Assessment /  Plan / Recommendation Clinical Impression  Pt presents with signs of reduced airwary protection when consuming ice chips and thin liquids. Multiple swallows elicited for very small sips followed by immediate coughing and sneezing. Pt showed improved performance when drinking nectar and honey thickened liquids. Overall limited intake of puree and thickened liquids due to Pt stating she was full and didn't want anthing else. Recommend D1/puree with NTL and provided Magic Cups on tray as she reports that she likes icecream. Can allow more liberalized diet if pt/family desires and pending goals of care. SLP will follow tomorrow.  SLP Visit Diagnosis: Dysphagia, oropharyngeal phase (R13.12)    Aspiration Risk  Mild aspiration risk;Risk for inadequate nutrition/hydration;Moderate aspiration risk    Diet Recommendation Dysphagia 1 (Puree);Nectar-thick liquid   Liquid Administration via: Cup;Spoon Medication Administration: Crushed with puree Supervision: Staff to assist with self feeding;Full supervision/cueing for compensatory strategies Compensations: Slow rate;Small sips/bites Postural Changes: Seated upright at 90 degrees;Remain upright for at least 30 minutes after po intake    Other  Recommendations Oral Care Recommendations: Oral care BID;Staff/trained caregiver to provide oral care Other Recommendations: Order thickener from pharmacy;Prohibited food (jello, ice cream, thin soups);Clarify dietary restrictions   Follow up Recommendations 24 hour supervision/assistance      Frequency and Duration min 2x/week  1 week       Prognosis Prognosis for Safe Diet Advancement: Fair Barriers to Reach Goals: Motivation  Swallow Study   General Date of Onset: 08/10/18 HPI: Kara Santos is a 84 y.o. female with medical history significant of dementia, recent shoulder fx, now bed bound, DM2.  Patient presents to the ED from SNF for evaluation of large pressure wounds on R heel and sacrum.  Pt  diagnosed with UTI today but not begun cipro yet.  Facility also reports patient with no PO intake in past 2 weeks. BSE requested due to coughing noted with breakfast meal and limited po intake noted at ALF prior to admission.  Type of Study: Bedside Swallow Evaluation Previous Swallow Assessment: March 2019 regular/thin per BSE Diet Prior to this Study: NPO Temperature Spikes Noted: No Respiratory Status: Nasal cannula History of Recent Intubation: No Behavior/Cognition: Alert;Cooperative;Pleasant mood;Requires cueing Oral Cavity Assessment: Dry Oral Care Completed by SLP: Yes Oral Cavity - Dentition: Missing dentition Vision: Impaired for self-feeding Self-Feeding Abilities: Total assist Patient Positioning: Upright in bed Baseline Vocal Quality: Normal Volitional Cough: Weak Volitional Swallow: Able to elicit    Oral/Motor/Sensory Function Overall Oral Motor/Sensory Function: Within functional limits   Ice Chips Ice chips: Impaired Presentation: Spoon Pharyngeal Phase Impairments: Suspected delayed Swallow;Multiple swallows   Thin Liquid Thin Liquid: Impaired Presentation: Cup;Spoon Pharyngeal  Phase Impairments: Suspected delayed Swallow;Multiple swallows;Cough - Immediate    Nectar Thick Nectar Thick Liquid: Within functional limits Presentation: Spoon;Cup   Honey Thick Honey Thick Liquid: Within functional limits Presentation: Cup;Spoon   Puree Puree: Within functional limits Presentation: Spoon   Solid     Solid: Not tested     Thank you,  Genene Churn, [place] 08/11/2018,4:37 PM

## 2018-08-11 NOTE — Consult Note (Addendum)
Consultation Note Date: 08/11/2018   Patient Name: Kara Santos  DOB: 03-19-1917  MRN: [id number]  Age / Sex: 84 y.o., female  PCP: Sinda Du, MD Referring Physician: Rodena Goldmann, DO  Reason for Consultation: Establishing goals of care  HPI/Patient Profile: 84 y.o. female  with past medical history of Alzheimer's dementia, iron deficiency anemia (iron infusion 03/18/18 and f/u appt with hemotology in December 2019), h/o AVM stomach with hemorrhage 2015, h/o falls, h/o left hip fracture 2014, DM, depression, recent right shoulder fracture and following with ortho admitted from [place] on 08/10/2018 with pressure ulcers on right heel and sacrum and know UTI and GIB with black tarry stools and Hgb 5.3. Admitted with sepsis with cellulitis around ulcers and UTI along with acute GIB requiring blood transfusion and protonix infusion. Concern that it is reported that patient has not eaten in ~2 weeks but has been taking in liquids. BMI 16.5 and extremely frail.   Clinical Assessment and Goals of Care: I have reviewed notes and spoken with RN. Kara Santos is pleasant but confused. She is sleepy but arouses to interact. Unable to answer most of my questions d/t underlying confusion with dementia and hard of hearing. No distress. Denies pain. RN reports continued dark, tarry stools but no other issues/complaints/concerns. Finishing 3rd PRBC and Hgb now 13.4 from 5.3.   Palliative provide Quinn Axe, NP met with Kara Santos and daughter, [name], 12/24/2017. She was struggling with poor intake at this time and associated renal failure. There was a plan for hospice per this discussion but unsure if this was ever initiated after d/c. She appears to have ongoing failure to thrive. Per current notes [name] is open to antibiotics and blood transfusions but not interested in aggressive care or endoscopy or aggressive wound care. I have  left voicemail with [name] to further discuss [place] and offer support. Patient eligible for hospice at [facility] and possible for hospice facility depending on outcomes and aggressiveness of care desired.   Update: I received return call from daughter [name]. [name] plans to meet with me tomorrow. She is exhausted today and plans to call me in the morning to arrange a time to meet and discuss GOC.   Primary Decision Maker NEXT OF KIN daughter, Creola Corn    SUMMARY OF RECOMMENDATIONS   - DNR - Recommend hospice support  Code Status/Advance Care Planning:  DNR   Symptom Management:   Denies pain/discomfort.  GIB: Transfuse as indicated. Protonix infusion. Consider clear liquid diet with Breeze supplement for nutrition as GIB allows.   Palliative Prophylaxis:   Aspiration, Delirium Protocol, Frequent Pain Assessment, Oral Care, Palliative Wound Care and Turn Reposition  Additional Recommendations (Limitations, Scope, Preferences):  Comfort care other than blood transfusion and antibiotics.   Psycho-social/Spiritual:   Desire for further Chaplaincy support:yes  Additional Recommendations: Caregiving  Support/Resources, Education on Hospice and Grief/Bereavement Support  Prognosis:   Overall very poor. Days to weeks possible (especially if no improvement or transition to full comfort). Definitely < 6 months and  appropriate for hospice.   Discharge Planning: To Be Determined      Primary Diagnoses: Present on Admission: . Dementia without behavioral disturbance ([place]) . Acute lower UTI . Acute blood loss anemia . Gastrointestinal hemorrhage with melena . Sacral decubitus ulcer . Decubitus ulcer of heel . Lactic acidosis   I have reviewed the medical record, interviewed the patient and family, and examined the patient. The following aspects are pertinent.  Past Medical History:  Diagnosis Date  . Alzheimer's disease, early onset ([place]) 2015  . Anemia 11/07/2011    chronic IDA requiring multiple transfusions  . AVM (arteriovenous malformation) of stomach, acquired with hemorrhage 12/04/2013  . Closed fracture of greater trochanter of femur ([place]) 02/2013   left  . Depression   . Diabetes mellitus   . Fall at home 11/17/14   multiple rib fractures  . GERD (gastroesophageal reflux disease)   . Hypothyroidism    s/p thyroid surgery for thyroid cancer  . Iron deficiency anemia 05/05/2012   Social History   Socioeconomic History  . Marital status: Widowed    Spouse name: Not on file  . Number of children: Not on file  . Years of education: Not on file  . Highest education level: Not on file  Occupational History  . Not on file  Social Needs  . Financial resource strain: Not on file  . Food insecurity:    Worry: Not on file    Inability: Not on file  . Transportation needs:    Medical: Not on file    Non-medical: Not on file  Tobacco Use  . Smoking status: Never Smoker  . Smokeless tobacco: Never Used  Substance and Sexual Activity  . Alcohol use: No  . Drug use: No  . Sexual activity: Not Currently  Lifestyle  . Physical activity:    Days per week: Not on file    Minutes per session: Not on file  . Stress: Not on file  Relationships  . Social connections:    Talks on phone: Not on file    Gets together: Not on file    Attends religious service: Not on file    Active member of club or organization: Not on file    Attends meetings of clubs or organizations: Not on file    Relationship status: Not on file  Other Topics Concern  . Not on file  Social History Narrative  . Not on file   Family History  Problem Relation Age of Onset  . Cancer Mother   . Cancer Father   . Diabetes Father   . Diabetes Sister   . Cancer Brother    Scheduled Meds: . Chlorhexidine Gluconate Cloth  6 each Topical Q0600  . [START ON 08/12/2018] Influenza vac split quadrivalent PF  0.5 mL Intramuscular Tomorrow-1000  . insulin aspart  0-9 Units  Subcutaneous Q4H  . mupirocin ointment  1 application Nasal BID  . [START ON 08/14/2018] pantoprazole  40 mg Intravenous Q12H   Continuous Infusions: . cefTRIAXone (ROCEPHIN)  IV    . pantoprozole (PROTONIX) infusion 8 mg/hr (08/11/18 0854)  . vancomycin Stopped (08/10/18 2256)   PRN Meds:.acetaminophen **OR** acetaminophen, ondansetron **OR** ondansetron (ZOFRAN) IV Allergies  Allergen Reactions  . Asa [Aspirin]     Acid reflux    Review of Systems  Unable to perform ROS: Dementia    Physical Exam  Constitutional: She appears lethargic. She appears cachectic. She has a sickly appearance. She appears ill.  HENT:  Head:  Normocephalic and atraumatic.  Cardiovascular: Tachycardia present.  Pulmonary/Chest: Effort normal. No accessory muscle usage. No tachypnea. No respiratory distress.  Abdominal: Soft. Normal appearance and bowel sounds are normal. There is no tenderness.  Neurological: She appears lethargic. She is disoriented.  Arouses easily from sleep  Skin:  Bilateral heels and sacral wounds dry and dressed and not assessed today by me.   Psychiatric: Cognition and memory are impaired.  Nursing note and vitals reviewed.   Vital Signs: BP (!) 95/56   Pulse (!) 104   Temp (!) 97.5 F (36.4 C) (Oral)   Resp 18   Ht _0  (1.6 m)   Wt 34 kg   SpO2 100%   BMI 13.28 kg/m  Pain Scale: 0-10   Pain Score: 0-No pain   SpO2: SpO2: 100 % O2 Device:SpO2: 100 % O2 Flow Rate: .   IO: Intake/output summary:   Intake/Output Summary (Last 24 hours) at 08/11/2018 1442 Last data filed at 08/11/2018 1040 Gross per 24 hour  Intake 2667.83 ml  Output -  Net 2667.83 ml    LBM: Last BM Date: 08/11/18 Baseline Weight: Weight: 41 kg Most recent weight: Weight: 34 kg     Palliative Assessment/Data: 20%     Time In: 1400 Time Out: 1430 Time Total: 30 min Greater than 50%  of this time was spent counseling and coordinating care related to the above assessment and  plan.  Signed by: Vinie Sill, NP Palliative Medicine Team Pager # [phone] (M-F 8a-5p) Team Phone # [phone] (Nights/Weekends)

## 2018-08-11 NOTE — Consult Note (Signed)
[facility] [address] Nurse wound consult note I spoke with the night shift primary RN, and Dr. Manuella Ghazi via the telephone.  Both are telling me the daughter does not want anything aggressive done.  Dr. Manuella Ghazi wants an approach to the sacral and heel wounds that will provide stability to the areas and  not be uncomfortable for the patient. Reason for Consult: Sacral and heel wounds from pressure.  All are unstageable PIs. Wound type: Pressure Injury POA: Yes Measurement: See entry from primary RN Wound bed: Dry, yellow/brown necrotic tissue. Drainage (amount, consistency, odor) No odor per primary RN, no drainage expressed per the primary nightshift RN. Dressing procedure/placement/frequency: Apply betadine to all wounds, allow to air dry.  Cover sacral wound with foam dressing.  Wrap heel wound with kerlex. Perform daily. Monitor the wound area(s) for worsening of condition such as: Signs/symptoms of infection,  Increase in size,  Development of or worsening of odor, Development of pain, or increased pain at the affected locations.  Notify the medical team if any of these develop.  Thank you for the consult.  Discussed plan of care with the patient and bedside nurse.  [facility] nurse will not follow at this time.  Please re-consult the [facility] team if needed.  Val Riles, RN, MSN, CWOCN, CNS-BC, pager [phone]

## 2018-08-11 NOTE — Progress Notes (Signed)
CRITICAL VALUE ALERT  Critical Value:  Lactic acid 5.8  Date & Time Notied: 08/11/2018 0225  Provider Notified: Sheran Luz  Orders Received/Actions taken: to check with lab to see when blood products will be here and infuse as soon as possible

## 2018-08-12 ENCOUNTER — Ambulatory Visit: Payer: Medicare Other | Admitting: Orthopaedic Surgery

## 2018-08-12 LAB — CBC
HCT: 43.5 % (ref 36.0–46.0)
HEMOGLOBIN: 13.7 g/dL (ref 12.0–15.0)
MCH: 28.6 pg (ref 26.0–34.0)
MCHC: 31.5 g/dL (ref 30.0–36.0)
MCV: 90.8 fL (ref 80.0–100.0)
Platelets: 223 10*3/uL (ref 150–400)
RBC: 4.79 MIL/uL (ref 3.87–5.11)
RDW: 18.4 % — AB (ref 11.5–15.5)
WBC: 18.7 10*3/uL — AB (ref 4.0–10.5)
nRBC: 0 % (ref 0.0–0.2)

## 2018-08-12 LAB — TYPE AND SCREEN
ABO/RH(D): B POS
Antibody Screen: NEGATIVE
DONOR AG TYPE: NEGATIVE
DONOR AG TYPE: NEGATIVE
DONOR AG TYPE: NEGATIVE
Unit division: 0
Unit division: 0
Unit division: 0

## 2018-08-12 LAB — URINE CULTURE

## 2018-08-12 LAB — BPAM RBC
BLOOD PRODUCT EXPIRATION DATE: 201912012359
BLOOD PRODUCT EXPIRATION DATE: 201912032359
Blood Product Expiration Date: 201912062359
ISSUE DATE / TIME: 201911060521
ISSUE DATE / TIME: 201911060843
ISSUE DATE / TIME: 201911060246
UNIT TYPE AND RH: 5100
Unit Type and Rh: 5100
Unit Type and Rh: 5100

## 2018-08-12 LAB — BASIC METABOLIC PANEL
Anion gap: 14 (ref 5–15)
BUN: 28 mg/dL — ABNORMAL HIGH (ref 8–23)
CALCIUM: 7.2 mg/dL — AB (ref 8.9–10.3)
CHLORIDE: 122 mmol/L — AB (ref 98–111)
CO2: 17 mmol/L — ABNORMAL LOW (ref 22–32)
CREATININE: 1.02 mg/dL — AB (ref 0.44–1.00)
GFR, EST AFRICAN AMERICAN: 51 mL/min — AB (ref >60)
GFR, EST NON AFRICAN AMERICAN: 44 mL/min — AB (ref >60)
Glucose, Bld: 213 mg/dL — ABNORMAL HIGH (ref 70–99)
Potassium: 2.4 mmol/L — CL (ref 3.5–5.1)
SODIUM: 153 mmol/L — AB (ref 135–145)

## 2018-08-12 LAB — LACTIC ACID, PLASMA: LACTIC ACID, VENOUS: 1.1 mmol/L (ref 0.5–1.9)

## 2018-08-12 LAB — MAGNESIUM: Magnesium: 1.3 mg/dL — ABNORMAL LOW (ref 1.7–2.4)

## 2018-08-12 LAB — GLUCOSE, CAPILLARY
GLUCOSE-CAPILLARY: 145 mg/dL — AB (ref 70–99)
GLUCOSE-CAPILLARY: 179 mg/dL — AB (ref 70–99)
Glucose-Capillary: 109 mg/dL — ABNORMAL HIGH (ref 70–99)
Glucose-Capillary: 172 mg/dL — ABNORMAL HIGH (ref 70–99)

## 2018-08-12 LAB — PHOSPHORUS: Phosphorus: 2.9 mg/dL (ref 2.5–4.6)

## 2018-08-12 MED ORDER — SODIUM CHLORIDE 0.9 % IV SOLN
2.0000 g | INTRAVENOUS | Status: DC
  Administered 2018-08-12: 2 g via INTRAVENOUS
  Filled 2018-08-12: qty 20
  Filled 2018-08-12: qty 2
  Filled 2018-08-12 (×2): qty 20

## 2018-08-12 MED ORDER — SODIUM CHLORIDE 0.45 % IV SOLN
INTRAVENOUS | Status: DC
  Administered 2018-08-12 – 2018-08-13 (×2): via INTRAVENOUS

## 2018-08-12 MED ORDER — FERROUS SULFATE 325 (65 FE) MG PO TABS
325.0000 mg | ORAL_TABLET | Freq: Every day | ORAL | Status: DC
  Administered 2018-08-13: 325 mg via ORAL
  Filled 2018-08-12: qty 1

## 2018-08-12 MED ORDER — POTASSIUM CHLORIDE 10 MEQ/100ML IV SOLN: 10.0000 meq | INTRAVENOUS | Status: DC

## 2018-08-12 MED ORDER — POTASSIUM CHLORIDE IN NACL 40-0.9 MEQ/L-% IV SOLN
INTRAVENOUS | Status: DC
  Administered 2018-08-12: 88 mL/h via INTRAVENOUS

## 2018-08-12 MED ORDER — SERTRALINE HCL 50 MG PO TABS
100.0000 mg | ORAL_TABLET | Freq: Every day | ORAL | Status: DC
  Administered 2018-08-12 – 2018-08-13 (×2): 100 mg via ORAL
  Filled 2018-08-12 (×2): qty 2

## 2018-08-12 MED ORDER — GUAIFENESIN-DM 100-10 MG/5ML PO SYRP: 10.0000 mL | ORAL_SOLUTION | Freq: Four times a day (QID) | ORAL | Status: DC | PRN

## 2018-08-12 MED ORDER — BIOTENE DRY MOUTH MT LIQD: 15.0000 mL | OROMUCOSAL | Status: DC | PRN

## 2018-08-12 MED ORDER — FENTANYL CITRATE (PF) 100 MCG/2ML IJ SOLN
12.5000 ug | INTRAMUSCULAR | Status: DC | PRN
  Administered 2018-08-12 (×2): 12.5 ug via INTRAVENOUS
  Filled 2018-08-12 (×2): qty 2

## 2018-08-12 MED ORDER — POLYVINYL ALCOHOL 1.4 % OP SOLN: 1.0000 [drp] | Freq: Four times a day (QID) | OPHTHALMIC | Status: DC | PRN

## 2018-08-12 MED ORDER — ADULT MULTIVITAMIN W/MINERALS CH
1.0000 | ORAL_TABLET | Freq: Every day | ORAL | Status: DC
  Administered 2018-08-12 – 2018-08-13 (×2): 1 via ORAL
  Filled 2018-08-12 (×2): qty 1

## 2018-08-12 MED ORDER — MECLIZINE HCL 12.5 MG PO TABS
25.0000 mg | ORAL_TABLET | Freq: Three times a day (TID) | ORAL | Status: DC
  Administered 2018-08-12 – 2018-08-13 (×4): 25 mg via ORAL
  Filled 2018-08-12 (×4): qty 2

## 2018-08-12 MED ORDER — TRAMADOL HCL 50 MG PO TABS: 50.0000 mg | ORAL_TABLET | Freq: Four times a day (QID) | ORAL | Status: DC | PRN

## 2018-08-12 MED ORDER — GLYCOPYRROLATE 0.2 MG/ML IJ SOLN: 0.2000 mg | INTRAMUSCULAR | Status: DC | PRN

## 2018-08-12 MED ORDER — POTASSIUM CHLORIDE 10 MEQ/100ML IV SOLN
10.0000 meq | INTRAVENOUS | Status: AC
  Administered 2018-08-12 (×6): 10 meq via INTRAVENOUS
  Filled 2018-08-12 (×6): qty 100

## 2018-08-12 MED ORDER — LEVOTHYROXINE SODIUM 25 MCG PO TABS
25.0000 ug | ORAL_TABLET | Freq: Every day | ORAL | Status: DC
  Administered 2018-08-13: 25 ug via ORAL
  Filled 2018-08-12: qty 1

## 2018-08-12 MED ORDER — HALOPERIDOL LACTATE 5 MG/ML IJ SOLN: 0.5000 mg | INTRAMUSCULAR | Status: DC | PRN

## 2018-08-12 MED ORDER — MAGNESIUM SULFATE 2 GM/50ML IV SOLN
2.0000 g | Freq: Once | INTRAVENOUS | Status: AC
  Administered 2018-08-12: 2 g via INTRAVENOUS
  Filled 2018-08-12: qty 50

## 2018-08-12 MED ORDER — VITAMIN B-12 100 MCG PO TABS
100.0000 ug | ORAL_TABLET | Freq: Every day | ORAL | Status: DC
  Administered 2018-08-12 – 2018-08-13 (×2): 100 ug via ORAL
  Filled 2018-08-12 (×3): qty 1

## 2018-08-12 NOTE — Clinical Social Work Note (Signed)
Received CSW consult stating that pt's family are deciding between return to [place] with Hospice care or transition to residential hospice facility. LCSW will be available to assist with needs to whichever option family chooses. Will follow.

## 2018-08-12 NOTE — Progress Notes (Signed)
Daily Progress Note   Patient Name: Kara Santos       Date: 08/12/2018 DOB: Feb 20, 1917  Age: 84 y.o. MRN#: [id number] Attending Physician: Kara Folks, MD Primary Care Physician: Kara Du, MD Admit Date: 08/10/2018  Reason for Consultation/Follow-up: Establishing goals of care  Subjective: [name] is resting comfortably and without complaint.   Length of Stay: 2  Current Medications: Scheduled Meds:  . Chlorhexidine Gluconate Cloth  6 each Topical Q0600  . Influenza vac split quadrivalent PF  0.5 mL Intramuscular Tomorrow-1000  . insulin aspart  0-9 Units Subcutaneous Q4H  . mupirocin ointment  1 application Nasal BID  . [START ON 08/14/2018] pantoprazole  40 mg Intravenous Q12H    Continuous Infusions: . sodium chloride 75 mL/hr at 08/12/18 0907  . sodium chloride 10 mL/hr at 08/12/18 0339  . pantoprozole (PROTONIX) infusion 8 mg/hr (08/12/18 0912)  . potassium chloride 10 mEq (08/12/18 0912)  . vancomycin Stopped (08/10/18 2256)    PRN Meds: sodium chloride, acetaminophen **OR** acetaminophen, ondansetron **OR** ondansetron (ZOFRAN) IV  Physical Exam  Constitutional: She appears lethargic. She appears cachectic. She appears ill.  Frail, elderly  HENT:  Head: Normocephalic and atraumatic.  Cardiovascular: Normal rate.  Pulmonary/Chest: Effort normal. No accessory muscle usage. No tachypnea. No respiratory distress.  Abdominal: Soft. Normal appearance. There is no tenderness.  Neurological: She appears lethargic. She is disoriented.  Nursing note and vitals reviewed.           Vital Signs: BP (!) 103/59   Pulse 94   Temp 98.1 F (36.7 C) (Oral)   Resp 19   Ht '5\' 3"'  (1.6 m)   Wt 35.7 kg   SpO2 100%   BMI 13.94 kg/m  SpO2: SpO2: 100 % O2 Device: O2  Device: Room Air O2 Flow Rate:    Intake/output summary:   Intake/Output Summary (Last 24 hours) at 08/12/2018 1020 Last data filed at 08/12/2018 0912 Gross per 24 hour  Intake 1679.94 ml  Output -  Net 1679.94 ml   LBM: Last BM Date: 08/11/18 Baseline Weight: Weight: 41 kg Most recent weight: Weight: 35.7 kg       Palliative Assessment/Data: 30%      Patient Active Problem List   Diagnosis Date Noted  . Failure to thrive in adult   .  Acute lower UTI 08/10/2018  . Gastrointestinal hemorrhage with melena 08/10/2018  . Sacral decubitus ulcer 08/10/2018  . Decubitus ulcer of heel 08/10/2018  . Lactic acidosis 08/10/2018  . Iron deficiency anemia due to chronic blood loss 05/18/2018  . Vaginal atrophy 05/18/2018  . PMB (postmenopausal bleeding) 05/18/2018  . Dementia without behavioral disturbance ([place]) 05/18/2018  . Protein-calorie malnutrition, severe 12/25/2017  . Dehydration   . Goals of care, counseling/discussion   . Palliative care by specialist   . Encounter for hospice care discussion   . AKI (acute kidney injury) ([place]) 12/23/2017  . GI bleed 08/07/2015  . Mandibular fracture, closed ([place]) 08/07/2015  . Acute encephalopathy 08/07/2015  . Hematochezia 08/07/2015  . Fall 11/20/2014  . Multiple rib fractures 11/17/2014  . AVM (arteriovenous malformation) of stomach, acquired with hemorrhage 12/04/2013  . Closed fracture of greater trochanter of femur ([place]) 02/06/2013  . History of fall 02/06/2013  . Iron deficiency anemia 05/05/2012  . Acute blood loss anemia 03/04/2012  . Chronic gastrointestinal bleeding 03/04/2012  . Diabetes mellitus type 2 in nonobese ([place]) 03/04/2012  . Hypothyroidism 03/04/2012  . History of thyroid cancer 03/04/2012    Palliative Care Assessment & Plan   HPI: 84 y.o. female  with past medical history of Alzheimer's dementia, iron deficiency anemia (iron infusion 03/18/18 and f/u appt with hemotology in December 2019), h/o AVM  stomach with hemorrhage 2015, h/o falls, h/o left hip fracture 2014, DM, depression, recent right shoulder fracture and following with ortho admitted from [facility] on 08/10/2018 with pressure ulcers on right heel and sacrum and know UTI and GIB with black tarry stools and Hgb 5.3. Admitted with sepsis with cellulitis around ulcers and UTI along with acute GIB requiring blood transfusion and protonix infusion. Concern that it is reported that patient has not eaten in ~2 weeks but has been taking in liquids. BMI 16.5 and extremely frail. Hemoglobin is staying up since transfusion completed 08/11/18 but continues with dark, tarry, liquid stools.   Assessment: Per nursing staff patient continues to have dark, tarry, liquid stools in large amounts. She has been initiated on dys I purred diet with nectar thick liquids. She will only eat a couple bites but drinks all liquids that are offered.   I met today with daughter, Kara Santos. Kara Santos is tired and knows that her mother has been declining gradually. She shares that she does not expect that we will be able to make her mother well. She does not anticipate that wounds will improve or even that the infections will clear. She does not want her mother to have any further blood transfusions as this could be prolonging her life. She is undecided about antibiotics and wants to leave this up to the medical team. Kara Santos was in the process of adding hospice to her mother's care at [facility] this week. We discussed comfort care and where this care should be provided. We discussed hospice at [facility] vs hospice facility at [facility]. Kara Santos is torn and we discussed the benefits and consequences of both options. My concern is that Kara Santos needs to have staff that can administer prn medication 24/7 to ensure that Kara Santos is comfortable especially with ongoing bleeding currently. I recommended to Kara Santos that she consider going to hospice as I do feel confident she will get the care she  needs there. Kara Santos would like to think more about these options. Kara Santos has my contact to call me for further questions or when she makes a decision.   Recommendations/Plan:  No more blood transfusions.  Minimize blood draws. D/C insulin and CBGs.   Consider d/c antibiotics.   Medications added prn to ensure comfort.   No life prolonging measures or escalation of care.   Considering transition to hospice facility.   Goals of Care and Additional Recommendations:  Limitations on Scope of Treatment: No Artificial Feeding, No Blood Transfusions, No Hemodialysis and No Surgical Procedures  Code Status:  DNR  Prognosis:   < 2 weeks  Discharge Planning:  Hospice facility vs return to [place] with hospice.   Care plan was discussed with Dr. Herbert Moors  Thank you for allowing the Palliative Medicine Team to assist in the care of this patient.   Time In: 1045 Time Out: 1145 Total Time 60 min Prolonged Time Billed  no       Greater than 50%  of this time was spent counseling and coordinating care related to the above assessment and plan.  Vinie Sill, NP Palliative Medicine Team Pager # [phone] (M-F 8a-5p) Team Phone # [phone] (Nights/Weekends)

## 2018-08-12 NOTE — Progress Notes (Signed)
PROGRESS NOTE    Kara Santos  WCB:[id number] DOB: 12-Dec-1916 DOA: 08/10/2018 PCP: Sinda Du, MD    Brief Narrative:  [age]-year-old with past medical history relevant for dementia, recent left shoulder fracture, type 2 diabetes, chronic grade 1 diastolic dysfunction by echo on 08/09/2015, history of stomach AVMs with hemorrhage, iron deficiency anemia admitted from skilled nursing facility [facility] with pressure ulcers on right heel and sacrum, UTI, melena and anemia.   Assessment & Plan:   Principal Problem:   Gastrointestinal hemorrhage with melena Active Problems:   Acute blood loss anemia   Diabetes mellitus type 2 in nonobese (HCC)   Dementia without behavioral disturbance (HCC)   Acute lower UTI   Sacral decubitus ulcer   Decubitus ulcer of heel   Lactic acidosis   Failure to thrive in adult   #) Melena/acute blood loss anemia from GI hemorrhage: Patient reportedly has a history of bleeding stomach AVMs.  Unfortunately she is not a candidate for any acute interventions as her functional status is quite poor. -Continue PPI drip - Status post 3 units packed red blood cells  #) Hypernatremia: Secondary to free water losses. -Half-normal saline  #) Sepsis from unclear source: Patient's 2 sources are possible enterococcus UTI versus heel and sacral unstageable ulcers.  As the family does not want anything dramatic or intense done we will empirically treat with IV antibiotics. - Continue IV ceftriaxone started 08/10/2018 -Continue IV vancomycin started 08/10/2018  -blood cultures from 08/10/2018- -08/09/2018 urine culture growing Enterococcus faecalis  #) Dementia/failure to thrive: Patient apparently is only been taking some liquids and no solids.  She is a dramatic failure to thrive and loss of weight.  Her functional status is quite poor. -Palliative care consult, daughters decided no aggressive treatments except antibiotics and transfusions  #) Type 2 diabetes: - Hold  metformin 500 mg every morning -Sliding scale insulin, SHS  #) Hypothyroidism: -Continue levothyroxine 25 mcg daily  #) Chronic grade 1 diastolic dysfunction: -Hold furosemide 40 mg daily  #) Pain/psych: -Continue sertraline 100 mg daily -Continue PRN tramadol -Continue PRN meclizine  Fluids: Half-normal saline Electro lites: Monitor and supplement Nutrition: Regular diet  Prophylaxis: SCDs  Disposition: Likely discharge to skilled nursing facility with hospice  Full code   Consultants:   Palliative care  Procedures:   None  Antimicrobials:   IV vancomycin started 04/09/2018  IV ceftriaxone started 08/10/2018 to 08/12/2018   Subjective: Patient is minimally arousable this morning.  She cannot tell me if she has any symptoms.  Per discussion with the bedside nurse she did have a fairly uneventful night last night.  Objective: Vitals:   08/12/18 0400 08/12/18 0500 08/12/18 0816 08/12/18 1141  BP:  (!) 103/59    Pulse:  94    Resp:  19    Temp: 98.9 F (37.2 C)  98.1 F (36.7 C) 97.7 F (36.5 C)  TempSrc: Axillary  Oral Oral  SpO2:  100%    Weight:  35.7 kg    Height:        Intake/Output Summary (Last 24 hours) at 08/12/2018 1223 Last data filed at 08/12/2018 1121 Gross per 24 hour  Intake 1471.25 ml  Output -  Net 1471.25 ml   Filed Weights   08/10/18 2156 08/11/18 0343 08/12/18 0500  Weight: 33.8 kg 34 kg 35.7 kg    Examination:  General exam: Lying in bed in no acute distress, minimally arousable Respiratory system: No increased work of breathing, anterior lung sounds clear.  Cardiovascular system: Regular rate and rhythm, no murmurs Gastrointestinal system: Abdomen is nondistended, soft and nontender. No organomegaly or masses felt. Normal bowel sounds heard. Central nervous system: Not alert or oriented, wakes up only to noxious stimuli Extremities: No lower extremity edema. Skin: No rashes over visible skin Psychiatry: Unable to assess due  to medical condition    Data Reviewed: I have personally reviewed following labs and imaging studies  CBC: Recent Labs  Lab 08/10/18 1850 08/11/18 1313 08/12/18 0407  WBC 18.3* 21.0* 18.7*  NEUTROABS 16.8*  --   --   HGB 5.3* 13.4 13.7  HCT 20.4* 44.4 43.5  MCV 93.2 91.7 90.8  PLT 263 255 595   Basic Metabolic Panel: Recent Labs  Lab 08/10/18 1850 08/11/18 1313 08/12/18 0407  NA 141 152* 153*  K 3.7 2.9* 2.4*  CL 110 119* 122*  CO2 13* 19* 17*  GLUCOSE 320* 113* 213*  BUN 40* 37* 28*  CREATININE 1.24* 1.18* 1.02*  CALCIUM 7.3* 7.6* 7.2*  MG  --   --  1.3*  PHOS  --   --  2.9   GFR: Estimated Creatinine Clearance: 16.9 mL/min (A) (by C-G formula based on SCr of 1.02 mg/dL (H)). Liver Function Tests: Recent Labs  Lab 08/10/18 1850  AST 17  ALT 12  ALKPHOS 99  BILITOT 0.5  PROT 5.2*  ALBUMIN 2.1*   No results for input(s): LIPASE, AMYLASE in the last 168 hours. No results for input(s): AMMONIA in the last 168 hours. Coagulation Profile: Recent Labs  Lab 08/11/18 1313  INR 1.21   Cardiac Enzymes: No results for input(s): CKTOTAL, CKMB, CKMBINDEX, TROPONINI in the last 168 hours. BNP (last 3 results) No results for input(s): PROBNP in the last 8760 hours. HbA1C: No results for input(s): HGBA1C in the last 72 hours. CBG: Recent Labs  Lab 08/11/18 1938 08/11/18 2358 08/12/18 0359 08/12/18 0813 08/12/18 1137  GLUCAP 119* 145* 179* 109* 172*   Lipid Profile: No results for input(s): CHOL, HDL, LDLCALC, TRIG, CHOLHDL, LDLDIRECT in the last 72 hours. Thyroid Function Tests: No results for input(s): TSH, T4TOTAL, FREET4, T3FREE, THYROIDAB in the last 72 hours. Anemia Panel: No results for input(s): VITAMINB12, FOLATE, FERRITIN, TIBC, IRON, RETICCTPCT in the last 72 hours. Sepsis Labs: Recent Labs  Lab 08/10/18 1850 08/10/18 2017 08/10/18 2351 08/12/18 0407  LATICACIDVEN 13.8* 3.7* 5.8* 1.1    Recent Results (from the past 240 hour(s))    Culture, Urine     Status: Abnormal   Collection Time: 08/09/18  7:45 AM  Result Value Ref Range Status   Specimen Description   Final    URINE, CLEAN CATCH Performed at [facility], [address] [place], [place] [address]    Special Requests   Final    NONE Performed at [facility], [address] [place], [place] [address]    Culture >=100,000 COLONIES/mL ENTEROCOCCUS FAECALIS (A)  Final   Report Status 08/12/2018 FINAL  Final   Organism ID, Bacteria ENTEROCOCCUS FAECALIS (A)  Final      Susceptibility   Enterococcus faecalis - MIC*    AMPICILLIN <=2 SENSITIVE Sensitive     LEVOFLOXACIN >=8 RESISTANT Resistant     NITROFURANTOIN <=16 SENSITIVE Sensitive     VANCOMYCIN 1 SENSITIVE Sensitive     * >=100,000 COLONIES/mL ENTEROCOCCUS FAECALIS  Culture, blood (routine x 2)     Status: None (Preliminary result)   Collection Time: 08/10/18  8:17 PM  Result Value Ref Range Status  Specimen Description BLOOD RIGHT FOREARM  Final   Special Requests   Final    BOTTLES DRAWN AEROBIC AND ANAEROBIC Blood Culture adequate volume   Culture   Final    NO GROWTH 2 DAYS Performed at [facility], [address], [place], [place] [address]    Report Status PENDING  Incomplete  MRSA PCR Screening     Status: Abnormal   Collection Time: 08/10/18  9:23 PM  Result Value Ref Range Status   MRSA by PCR POSITIVE (A) NEGATIVE Final    Comment:        The GeneXpert MRSA Assay (FDA approved for NASAL specimens only), is one component of a comprehensive MRSA colonization surveillance program. It is not intended to diagnose MRSA infection nor to guide or monitor treatment for MRSA infections. RESULT CALLED TO, READ BACK BY AND VERIFIED WITH: NURSE JESSICA AT 0458 ON 08/11/2018 BY [name] Performed at [facility], [address] [place], [place] [address]   Culture, blood (routine x 2)     Status: None (Preliminary result)   Collection Time: 08/10/18 11:37 PM  Result Value Ref Range Status    Specimen Description BLOOD LEFT FOREARM  Final   Special Requests   Final    BOTTLES DRAWN AEROBIC AND ANAEROBIC Blood Culture adequate volume   Culture   Final    NO GROWTH 2 DAYS Performed at [facility], [address] [place],  [address]    Report Status PENDING  Incomplete         Radiology Studies: Dg Chest 1 View  Result Date: 08/10/2018 CLINICAL DATA:  Initial evaluation for acute tachycardia, weakness. EXAM: CHEST  1 VIEW COMPARISON:  Prior radiograph from 08/07/2015. FINDINGS: Cardiac and mediastinal silhouettes are stable in size and contour, and remain within normal limits. Patient markedly rotated to the right. Lungs hypoinflated. Asymmetric hazy opacity overlying the right upper lobe favored to be related to technique/patient positioning, although infiltrate not entirely excluded. No other focal airspace disease. No pulmonary edema or pleural effusion. No pneumothorax. Diffuse osteopenia.  Known left shoulder fracture noted. IMPRESSION: 1. Asymmetric hazy opacity overlying the right upper lobe, favored to be related to patient rotation/technique. Superimposed infiltrate not entirely excluded. Correlation with physical exam and symptomatology recommended. 2. Low lung volumes with no other active cardiopulmonary disease identified. Electronically Signed   By: Jeannine Boga M.D.   On: 08/10/2018 19:43   Dg Pelvis 1-2 Views  Result Date: 08/10/2018 CLINICAL DATA:  Initial evaluation for decubitus sacral wound. EXAM: PELVIS - 1-2 VIEW COMPARISON:  None. FINDINGS: No acute fracture or dislocation. Femoral heads in normal alignment within the acetabula. Bony pelvis intact. SI joints approximated. No pubic diastasis. Osteoarthritic changes noted about the hips bilaterally. Degenerative changes noted within lower lumbar spine. No acute soft tissue abnormality. Known sacral wound not well delineated. No radiographic evidence for active osteomyelitis. IMPRESSION: No acute  osseous abnormality about the pelvis. Electronically Signed   By: Jeannine Boga M.D.   On: 08/10/2018 19:45        Scheduled Meds: . Chlorhexidine Gluconate Cloth  6 each Topical Q0600  . mupirocin ointment  1 application Nasal BID  . [START ON 08/14/2018] pantoprazole  40 mg Intravenous Q12H   Continuous Infusions: . sodium chloride 75 mL/hr at 08/12/18 0907  . sodium chloride 10 mL/hr at 08/12/18 0339  . pantoprozole (PROTONIX) infusion 8 mg/hr (08/12/18 1121)  . vancomycin Stopped (08/10/18 2256)     LOS: 2 days  Time spent: [place], MD [facility]  If 7PM-7AM, please contact night-coverage [email or web address] Password [facility] 08/12/2018, 12:23 PM

## 2018-08-12 NOTE — Progress Notes (Signed)
Nutrition Brief Note  84 year old female who presented to the ED on 11/5 from [place] with pressure injuries. PMH significant for Alzheimer's disease, depression, diabetes mellitus, GERD, and recent shoulder fracture now bed-bound. Pt found to have GI bleed with melena and acute blood loss anemia (received 2 units PRBC on 11/5) as well as UTI and severe lactic acidosis.  Chart reviewed.  Per palliative care note, no further escalation of care. No more blood transfusions, d/c insulin and CBG's. Pt's daughter is considering transition to hospice facility.  No further nutrition interventions warranted at this time.  Please consult as needed.    Gaynell Face, MS, RD, LDN Inpatient Clinical Dietitian Pager: [phone] Weekend/After Hours: [phone].

## 2018-08-13 ENCOUNTER — Other Ambulatory Visit: Payer: Self-pay

## 2018-08-13 ENCOUNTER — Encounter (HOSPITAL_COMMUNITY): Payer: Self-pay

## 2018-08-13 ENCOUNTER — Inpatient Hospital Stay (HOSPITAL_COMMUNITY)
Admission: RE | Admit: 2018-08-13 | Discharge: 2018-08-14 | DRG: 951 | Disposition: A | Discharge status: Hospice | Source: Ambulatory Visit | Attending: Pulmonary Disease | Admitting: Pulmonary Disease

## 2018-08-13 DIAGNOSIS — Z809 Family history of malignant neoplasm, unspecified: Secondary | ICD-10-CM

## 2018-08-13 DIAGNOSIS — N390 Urinary tract infection, site not specified: Secondary | ICD-10-CM | POA: Diagnosis present

## 2018-08-13 DIAGNOSIS — K921 Melena: Secondary | ICD-10-CM | POA: Diagnosis not present

## 2018-08-13 DIAGNOSIS — E890 Postprocedural hypothyroidism: Secondary | ICD-10-CM | POA: Diagnosis present

## 2018-08-13 DIAGNOSIS — E119 Type 2 diabetes mellitus without complications: Secondary | ICD-10-CM

## 2018-08-13 DIAGNOSIS — Z85850 Personal history of malignant neoplasm of thyroid: Secondary | ICD-10-CM

## 2018-08-13 DIAGNOSIS — K31811 Angiodysplasia of stomach and duodenum with bleeding: Secondary | ICD-10-CM | POA: Diagnosis present

## 2018-08-13 DIAGNOSIS — R627 Adult failure to thrive: Secondary | ICD-10-CM | POA: Diagnosis present

## 2018-08-13 DIAGNOSIS — H04129 Dry eye syndrome of unspecified lacrimal gland: Secondary | ICD-10-CM | POA: Diagnosis not present

## 2018-08-13 DIAGNOSIS — K254 Chronic or unspecified gastric ulcer with hemorrhage: Secondary | ICD-10-CM | POA: Diagnosis not present

## 2018-08-13 DIAGNOSIS — K5730 Diverticulosis of large intestine without perforation or abscess without bleeding: Secondary | ICD-10-CM | POA: Diagnosis present

## 2018-08-13 DIAGNOSIS — L89159 Pressure ulcer of sacral region, unspecified stage: Secondary | ICD-10-CM | POA: Diagnosis not present

## 2018-08-13 DIAGNOSIS — S72113A Displaced fracture of greater trochanter of unspecified femur, initial encounter for closed fracture: Secondary | ICD-10-CM | POA: Diagnosis present

## 2018-08-13 DIAGNOSIS — Z833 Family history of diabetes mellitus: Secondary | ICD-10-CM

## 2018-08-13 DIAGNOSIS — I5084 End stage heart failure: Secondary | ICD-10-CM | POA: Diagnosis not present

## 2018-08-13 DIAGNOSIS — Z886 Allergy status to analgesic agent status: Secondary | ICD-10-CM | POA: Diagnosis not present

## 2018-08-13 DIAGNOSIS — G309 Alzheimer's disease, unspecified: Secondary | ICD-10-CM | POA: Diagnosis present

## 2018-08-13 DIAGNOSIS — F419 Anxiety disorder, unspecified: Secondary | ICD-10-CM | POA: Diagnosis present

## 2018-08-13 DIAGNOSIS — K219 Gastro-esophageal reflux disease without esophagitis: Secondary | ICD-10-CM | POA: Diagnosis present

## 2018-08-13 DIAGNOSIS — F0391 Unspecified dementia with behavioral disturbance: Secondary | ICD-10-CM

## 2018-08-13 DIAGNOSIS — E43 Unspecified severe protein-calorie malnutrition: Secondary | ICD-10-CM | POA: Diagnosis not present

## 2018-08-13 DIAGNOSIS — Z515 Encounter for palliative care: Secondary | ICD-10-CM | POA: Diagnosis not present

## 2018-08-13 DIAGNOSIS — L89619 Pressure ulcer of right heel, unspecified stage: Secondary | ICD-10-CM | POA: Diagnosis present

## 2018-08-13 DIAGNOSIS — K922 Gastrointestinal hemorrhage, unspecified: Secondary | ICD-10-CM | POA: Diagnosis not present

## 2018-08-13 DIAGNOSIS — Z79890 Hormone replacement therapy (postmenopausal): Secondary | ICD-10-CM | POA: Diagnosis not present

## 2018-08-13 DIAGNOSIS — F0390 Unspecified dementia without behavioral disturbance: Secondary | ICD-10-CM | POA: Diagnosis not present

## 2018-08-13 DIAGNOSIS — S72112S Displaced fracture of greater trochanter of left femur, sequela: Secondary | ICD-10-CM | POA: Diagnosis not present

## 2018-08-13 DIAGNOSIS — S4292XD Fracture of left shoulder girdle, part unspecified, subsequent encounter for fracture with routine healing: Secondary | ICD-10-CM | POA: Diagnosis not present

## 2018-08-13 DIAGNOSIS — M6281 Muscle weakness (generalized): Secondary | ICD-10-CM | POA: Diagnosis not present

## 2018-08-13 DIAGNOSIS — Z7189 Other specified counseling: Secondary | ICD-10-CM | POA: Diagnosis not present

## 2018-08-13 DIAGNOSIS — Q2733 Arteriovenous malformation of digestive system vessel: Secondary | ICD-10-CM | POA: Diagnosis not present

## 2018-08-13 DIAGNOSIS — R682 Dry mouth, unspecified: Secondary | ICD-10-CM | POA: Diagnosis not present

## 2018-08-13 DIAGNOSIS — R52 Pain, unspecified: Secondary | ICD-10-CM | POA: Diagnosis not present

## 2018-08-13 LAB — CBC
HCT: 45.2 % (ref 36.0–46.0)
Hemoglobin: 13.6 g/dL (ref 12.0–15.0)
MCH: 27.6 pg (ref 26.0–34.0)
MCHC: 30.1 g/dL (ref 30.0–36.0)
MCV: 91.9 fL (ref 80.0–100.0)
Platelets: 205 10*3/uL (ref 150–400)
RBC: 4.92 MIL/uL (ref 3.87–5.11)
RDW: 19.4 % — ABNORMAL HIGH (ref 11.5–15.5)
WBC: 12.5 10*3/uL — ABNORMAL HIGH (ref 4.0–10.5)
nRBC: 0 % (ref 0.0–0.2)

## 2018-08-13 LAB — BASIC METABOLIC PANEL WITH GFR
BUN: 18 mg/dL (ref 8–23)
Chloride: 121 mmol/L — ABNORMAL HIGH (ref 98–111)
Creatinine, Ser: 0.77 mg/dL (ref 0.44–1.00)
Potassium: 3 mmol/L — ABNORMAL LOW (ref 3.5–5.1)
Sodium: 148 mmol/L — ABNORMAL HIGH (ref 135–145)

## 2018-08-13 LAB — BASIC METABOLIC PANEL
Anion gap: 8 (ref 5–15)
CO2: 19 mmol/L — ABNORMAL LOW (ref 22–32)
Calcium: 6.8 mg/dL — ABNORMAL LOW (ref 8.9–10.3)
GFR calc Af Amer: >60 mL/min (ref >60)
GFR calc non Af Amer: >60 mL/min (ref >60)
Glucose, Bld: 189 mg/dL — ABNORMAL HIGH (ref 70–99)

## 2018-08-13 LAB — MAGNESIUM: Magnesium: 1.8 mg/dL (ref 1.7–2.4)

## 2018-08-13 MED ORDER — ONDANSETRON HCL 4 MG/2ML IJ SOLN: 4.0000 mg | Freq: Four times a day (QID) | INTRAMUSCULAR | Status: DC | PRN

## 2018-08-13 MED ORDER — MORPHINE SULFATE (CONCENTRATE) 10 MG/0.5ML PO SOLN: 5.0000 mg | ORAL | Status: DC | PRN

## 2018-08-13 MED ORDER — ACETAMINOPHEN 650 MG RE SUPP: 650.0000 mg | Freq: Four times a day (QID) | RECTAL | Status: DC | PRN

## 2018-08-13 MED ORDER — POLYVINYL ALCOHOL 1.4 % OP SOLN: 1.0000 [drp] | Freq: Four times a day (QID) | OPHTHALMIC | Status: DC | PRN

## 2018-08-13 MED ORDER — DIPHENHYDRAMINE HCL 50 MG/ML IJ SOLN: 12.5000 mg | INTRAMUSCULAR | Status: DC | PRN

## 2018-08-13 MED ORDER — HALOPERIDOL LACTATE 2 MG/ML PO CONC
0.5000 mg | ORAL | Status: DC | PRN
  Filled 2018-08-13: qty 0.3

## 2018-08-13 MED ORDER — MECLIZINE HCL 12.5 MG PO TABS
25.0000 mg | ORAL_TABLET | Freq: Three times a day (TID) | ORAL | Status: DC
  Administered 2018-08-13 – 2018-08-14 (×2): 25 mg via ORAL
  Filled 2018-08-13 (×2): qty 2

## 2018-08-13 MED ORDER — ONDANSETRON HCL 4 MG PO TABS: 4.0000 mg | ORAL_TABLET | Freq: Four times a day (QID) | ORAL | Status: DC | PRN

## 2018-08-13 MED ORDER — HYDROMORPHONE HCL 1 MG/ML IJ SOLN: 0.5000 mg | INTRAMUSCULAR | Status: DC | PRN

## 2018-08-13 MED ORDER — GUAIFENESIN-DM 100-10 MG/5ML PO SYRP: 10.0000 mL | ORAL_SOLUTION | Freq: Four times a day (QID) | ORAL | Status: DC | PRN

## 2018-08-13 MED ORDER — TRAMADOL HCL 50 MG PO TABS: 50.0000 mg | ORAL_TABLET | Freq: Four times a day (QID) | ORAL | Status: DC | PRN

## 2018-08-13 MED ORDER — GLYCOPYRROLATE 0.2 MG/ML IJ SOLN: 0.2000 mg | INTRAMUSCULAR | Status: DC | PRN

## 2018-08-13 MED ORDER — HALOPERIDOL LACTATE 5 MG/ML IJ SOLN: 0.5000 mg | INTRAMUSCULAR | Status: DC | PRN

## 2018-08-13 MED ORDER — BIOTENE DRY MOUTH MT LIQD: 15.0000 mL | OROMUCOSAL | Status: DC | PRN

## 2018-08-13 MED ORDER — ACETAMINOPHEN 325 MG PO TABS: 650.0000 mg | ORAL_TABLET | Freq: Four times a day (QID) | ORAL | Status: DC | PRN

## 2018-08-13 MED ORDER — LORAZEPAM 2 MG/ML PO CONC: 1.0000 mg | ORAL | Status: DC | PRN

## 2018-08-13 MED ORDER — POLYVINYL ALCOHOL 1.4 % OP SOLN: 1.0000 [drp] | Freq: Four times a day (QID) | OPHTHALMIC | 0 refills | Status: DC | PRN

## 2018-08-13 MED ORDER — SODIUM CHLORIDE 0.9% FLUSH: 3.0000 mL | INTRAVENOUS | Status: DC | PRN

## 2018-08-13 MED ORDER — ALUM & MAG HYDROXIDE-SIMETH 200-200-20 MG/5ML PO SUSP: 30.0000 mL | Freq: Four times a day (QID) | ORAL | Status: DC | PRN

## 2018-08-13 MED ORDER — BISACODYL 10 MG RE SUPP: 10.0000 mg | Freq: Every day | RECTAL | Status: DC | PRN

## 2018-08-13 MED ORDER — PANTOPRAZOLE SODIUM 40 MG PO TBEC
40.0000 mg | DELAYED_RELEASE_TABLET | Freq: Every day | ORAL | Status: DC
  Administered 2018-08-14: 40 mg via ORAL
  Filled 2018-08-13: qty 1

## 2018-08-13 MED ORDER — ALBUTEROL SULFATE (2.5 MG/3ML) 0.083% IN NEBU: 2.5000 mg | INHALATION_SOLUTION | RESPIRATORY_TRACT | Status: DC | PRN

## 2018-08-13 MED ORDER — ONDANSETRON 4 MG PO TBDP: 4.0000 mg | ORAL_TABLET | Freq: Four times a day (QID) | ORAL | Status: DC | PRN

## 2018-08-13 MED ORDER — SERTRALINE HCL 50 MG PO TABS
100.0000 mg | ORAL_TABLET | Freq: Every day | ORAL | Status: DC
  Administered 2018-08-14: 100 mg via ORAL
  Filled 2018-08-13: qty 2

## 2018-08-13 MED ORDER — HALOPERIDOL 0.5 MG PO TABS: 0.5000 mg | ORAL_TABLET | ORAL | Status: DC | PRN

## 2018-08-13 MED ORDER — GLYCOPYRROLATE 0.2 MG/ML IJ SOLN: 0.2000 mg | INTRAMUSCULAR | 1 refills | Status: DC | PRN

## 2018-08-13 MED ORDER — LOPERAMIDE HCL 2 MG PO CAPS
2.0000 mg | ORAL_CAPSULE | ORAL | Status: DC | PRN
  Filled 2018-08-13: qty 1

## 2018-08-13 MED ORDER — LORAZEPAM 2 MG/ML IJ SOLN: 1.0000 mg | INTRAMUSCULAR | Status: DC | PRN

## 2018-08-13 MED ORDER — SODIUM CHLORIDE 0.9% FLUSH
3.0000 mL | Freq: Two times a day (BID) | INTRAVENOUS | Status: DC
  Administered 2018-08-14: 3 mL via INTRAVENOUS

## 2018-08-13 MED ORDER — BACLOFEN 10 MG PO TABS: 5.0000 mg | ORAL_TABLET | Freq: Two times a day (BID) | ORAL | Status: DC | PRN

## 2018-08-13 MED ORDER — LORAZEPAM 1 MG PO TABS: 1.0000 mg | ORAL_TABLET | ORAL | Status: DC | PRN

## 2018-08-13 MED ORDER — SENNA 8.6 MG PO TABS: 1.0000 | ORAL_TABLET | Freq: Every evening | ORAL | Status: DC | PRN

## 2018-08-13 MED ORDER — GLYCOPYRROLATE 1 MG PO TABS: 1.0000 mg | ORAL_TABLET | ORAL | Status: DC | PRN

## 2018-08-13 MED ORDER — SODIUM CHLORIDE 0.9 % IV SOLN: 250.0000 mL | INTRAVENOUS | Status: DC | PRN

## 2018-08-13 NOTE — Clinical Social Work Note (Signed)
Basha, Krygier #[id number] (CSN: [id number]) (84 y.o. F) (Adm: 08/10/18)  [id number]  PCP   Luan Pulling, EDWARD  Demographics  Comment    Last edited by  on at   Address: Home Phone: Work Phone: Theatre manager:  [place] [address]  [place] [address]   [phone] -- --  SSN: Insurance: Marital Status: Religion:  [phone] [place]  Patient Ethnicity & Race   Ethnic Group Patient Race  Not Hispanic or Latino White or Caucasian  Documents Filed to Patient   Power of Attorney Living Will Clinical Unknown Study Attachment Consent Form ABN Waiver After Visit Summary Lab Result Scan Code Status MyChart Status Advance Care Planning  Not on File Not on File Not on File Not on File Filed Not on File Jani Files DNR [Updated on 08/12/18 1214] Code Exp Jump to the Activity  Admission Information   Attending Provider Admitting Provider Admission Type Admission Date/Time  Purohit, Konrad Dolores, MD Etta Quill, DO Emergency 08/10/18 1801  Discharge Date Hospital Service Auth/Cert Status Service Area   Internal Medicine Incomplete [facility]  Unit Room/Bed Admission Status   AP-DEPT 300 A326/A326-01 Admission (Confirmed)   Hospital Account   Name Acct ID Class Status Primary Coverage  Jeanee, Fabre [id number] Inpatient Open MEDICARE - MEDICARE PART A AND B      Guarantor Account (for Hospital Account [id number])   Name Relation to Pt Service Area Active? Acct Type  Birdie Hopes T Self CHSA Yes Personal/Family  Address Phone    [address] [place], [place] [address] [phone](N)        Coverage Information (for Hospital Account [id number])   F/O Payor/Plan Precert #  MEDICARE/MEDICARE PART A AND B   Subscriber Subscriber #  Kadra, Kohan [id number]  Address Phone  [address] [place], [place] [address]

## 2018-08-13 NOTE — Plan of Care (Signed)

## 2018-08-13 NOTE — H&P (Signed)
History and Physical    Kara Santos:[id number] DOB: 01-30-1917 DOA: 08/13/2018  PCP: Sinda Du, MD   Patient coming from: ALF/home   Chief Complaint: hospice  HPI: Kara Santos is a 84 y.o. female with medical history significant of dementia, recent left shoulder fracture, type 2 diabetes, chronic grade 1 diastolic dysfunction by echo on 08/09/2015, history of stomach AVMs with hemorrhage, iron deficiency anemia admitted from assisted living facility [facility] with pressure ulcers on right heel and sacrum, UTI, melena and anemi on 08/10/2018.  She was given supportive care and treated for possible UTI as well as unstageable decubitus and heel ulcers.  Her mental status did not improve.  Her acute on chronic GI bleeding resolved with a PPI drip and transfusions.  Her hemoglobin was stable.  Due to her severe dementia, multiple medical comorbidities it was decided to treat the patient conservatively she was not a candidate for any invasive treatments.  On discussion with the daughter and palliative care was decided to transition the patient to hospice.  Patient was discharged on 08/13/2018 and admitted to general inpatient hospice as there were no beds available at [facility] residential hospice.  Currently the patient appears to be comfortable and does not have any nausea, vomiting, diarrhea, cough, congestion, rhinorrhea.  ED Course: Not applicable  Review of Systems: As per HPI otherwise 10 point review of systems negative.   Past Medical History:  Diagnosis Date  . Alzheimer's disease, early onset ([place]) 2015  . Anemia 11/07/2011   chronic IDA requiring multiple transfusions  . AVM (arteriovenous malformation) of stomach, acquired with hemorrhage 12/04/2013  . Closed fracture of greater trochanter of femur ([place]) 02/2013   left  . Depression   . Diabetes mellitus   . Fall at home 11/17/14   multiple rib fractures  . GERD (gastroesophageal reflux disease)   . Hypothyroidism    s/p  thyroid surgery for thyroid cancer  . Iron deficiency anemia 05/05/2012    Past Surgical History:  Procedure Laterality Date  . APPENDECTOMY    . bilateral cataract extractions    . BREAST REDUCTION SURGERY    . CHOLECYSTECTOMY    . COLONOSCOPY  08/2006   Dr. Cecelia Byars sided diverticulosis  . double balloon enteroscopy  06/1008   [facility]-->APC of 12 SB AVMs in jejunum. 6 feet of jejunum inspected  . ESOPHAGOGASTRODUODENOSCOPY  08/2006   Dr. Trevor Iha [facility], large duodenal diverticulum  . Givens small bowel capsule  08/2006   few gastric erosions, numerous small bowel AVMs with SB erosions  . right shoulder surgery    . THYROID SURGERY     thyroid cancer     reports that she has never smoked. She has never used smokeless tobacco. She reports that she does not drink alcohol or use drugs.  Allergies  Allergen Reactions  . Asa [Aspirin]     Acid reflux     Family History  Problem Relation Age of Onset  . Cancer Mother   . Cancer Father   . Diabetes Father   . Diabetes Sister   . Cancer Brother    Unacceptable: Noncontributory, unremarkable, or negative. Acceptable: Family history reviewed and not pertinent (If you reviewed it)  Prior to Admission medications   Medication Sig Start Date End Date Taking? Authorizing Provider  acetaminophen (TYLENOL) 500 MG tablet Take 1,000 mg by mouth 3 (three) times daily.    [provider]  benzocaine-resorcinol (VAGISIL) 5-2 % vaginal cream Place 2 g vaginally daily as  needed for itching or irritation.     [provider]  furosemide (LASIX) 40 MG tablet Take 40 mg by mouth daily.  01/14/18   [provider]  glycopyrrolate (ROBINUL) 0.2 MG/ML injection Inject 1 mL (0.2 mg total) into the vein every 4 (four) hours as needed (secretions, gurgling). 08/13/18   Lion Fernandez, Konrad Dolores, MD  guaiFENesin-dextromethorphan (ROBITUSSIN DM) 100-10 MG/5ML syrup Take 10 mLs by mouth every 6 (six) hours as needed for cough.     [provider]  loperamide (IMODIUM A-D) 2 MG tablet Take 2 mg by mouth every 2 (two) hours as needed for diarrhea or loose stools.    [provider]  meclizine (ANTIVERT) 25 MG tablet Take 25 mg by mouth 3 (three) times daily.    [provider]  ondansetron (ZOFRAN) 4 MG tablet Take 4 mg by mouth 4 (four) times daily as needed for nausea or vomiting.    [provider]  pantoprazole (PROTONIX) 40 MG tablet Take 40 mg by mouth daily.    [provider]  polyvinyl alcohol (LIQUIFILM TEARS) 1.4 % ophthalmic solution Place 1 drop into both eyes 4 (four) times daily as needed for dry eyes. 08/13/18   Eron Goble, Konrad Dolores, MD  sertraline (ZOLOFT) 100 MG tablet Take 1 tablet by mouth daily. 12/05/17   [provider]  traMADol (ULTRAM) 50 MG tablet Take 50 mg by mouth every 6 (six) hours as needed for moderate pain. Pain not relieved by Tylenol.    [provider]    Physical Exam: There were no vitals filed for this visit. General exam: Lying in bed in no acute distress, minimally arousable Respiratory system: No increased work of breathing, anterior lung sounds clear. Cardiovascular system: Regular rate and rhythm, no murmurs Gastrointestinal system: Abdomen is nondistended, soft and nontender. No organomegaly or masses felt. Normal bowel sounds heard. Central nervous system: Not alert or oriented, wakes up only to noxious stimuli Extremities: No lower extremity edema. Skin: No rashes over visible skin Psychiatry: Unable to assess due to medical condition Labs on Admission: I have personally reviewed following labs and imaging studies  CBC: Recent Labs  Lab 08/10/18 1850 08/11/18 1313 08/12/18 0407 08/13/18 0513  WBC 18.3* 21.0* 18.7* 12.5*  NEUTROABS 16.8*  --   --   --   HGB 5.3* 13.4 13.7 13.6  HCT 20.4* 44.4 43.5 45.2  MCV 93.2 91.7 90.8 91.9  PLT 263 255 223 614   Basic Metabolic Panel: Recent Labs  Lab 08/10/18 1850  08/11/18 1313 08/12/18 0407 08/13/18 0513  NA 141 152* 153* 148*  K 3.7 2.9* 2.4* 3.0*  CL 110 119* 122* 121*  CO2 13* 19* 17* 19*  GLUCOSE 320* 113* 213* 189*  BUN 40* 37* 28* 18  CREATININE 1.24* 1.18* 1.02* 0.77  CALCIUM 7.3* 7.6* 7.2* 6.8*  MG  --   --  1.3* 1.8  PHOS  --   --  2.9  --    GFR: Estimated Creatinine Clearance: 21.6 mL/min (by C-G formula based on SCr of 0.77 mg/dL). Liver Function Tests: Recent Labs  Lab 08/10/18 1850  AST 17  ALT 12  ALKPHOS 99  BILITOT 0.5  PROT 5.2*  ALBUMIN 2.1*   No results for input(s): LIPASE, AMYLASE in the last 168 hours. No results for input(s): AMMONIA in the last 168 hours. Coagulation Profile: Recent Labs  Lab 08/11/18 1313  INR 1.21   Cardiac Enzymes: No results for input(s): CKTOTAL, CKMB, CKMBINDEX,  TROPONINI in the last 168 hours. BNP (last 3 results) No results for input(s): PROBNP in the last 8760 hours. HbA1C: No results for input(s): HGBA1C in the last 72 hours. CBG: Recent Labs  Lab 08/11/18 1938 08/11/18 2358 08/12/18 0359 08/12/18 0813 08/12/18 1137  GLUCAP 119* 145* 179* 109* 172*   Lipid Profile: No results for input(s): CHOL, HDL, LDLCALC, TRIG, CHOLHDL, LDLDIRECT in the last 72 hours. Thyroid Function Tests: No results for input(s): TSH, T4TOTAL, FREET4, T3FREE, THYROIDAB in the last 72 hours. Anemia Panel: No results for input(s): VITAMINB12, FOLATE, FERRITIN, TIBC, IRON, RETICCTPCT in the last 72 hours. Urine analysis:    Component Value Date/Time   COLORURINE YELLOW 08/09/2018 0745   APPEARANCEUR HAZY (A) 08/09/2018 0745   LABSPEC 1.015 08/09/2018 0745   PHURINE 5.0 08/09/2018 0745   GLUCOSEU NEGATIVE 08/09/2018 0745   HGBUR MODERATE (A) 08/09/2018 0745   BILIRUBINUR NEGATIVE 08/09/2018 0745   KETONESUR NEGATIVE 08/09/2018 0745   PROTEINUR NEGATIVE 08/09/2018 0745   UROBILINOGEN 0.2 08/07/2015 1235   NITRITE NEGATIVE 08/09/2018 0745   LEUKOCYTESUR MODERATE (A) 08/09/2018 0745     Radiological Exams on Admission: No results found.  EKG: Independently reviewed. N/A  Assessment/Plan Active Problems:   Chronic gastrointestinal bleeding   Diabetes mellitus type 2 in nonobese [facility])   Closed fracture of greater trochanter of femur (HCC)   AVM (arteriovenous malformation) of stomach, acquired with hemorrhage   GI bleed   Palliative care by specialist   Dementia without behavioral disturbance ([place])   Failure to thrive in adult    #) Failure to thrive/dementia/end-of-life care: Daughter is decided to transition the patient to residential hospice.  As there are no beds available we will admit the patient here with hospice -IV hydromorphone for pain and dyspnea -IV lorazepam for anxiety -IV B haloperidol for agitation -IV GlycoPEGylated for secretions  - palliative care consult, pending placement in residential hospice  #) Chronic medical conditions: Currently the patient is being treated for any of these.  Fluids: As tolerated Elect lites: Monitor and supplement Nutrition: Dysphagia 1 diet   Prophylaxis: None  Disposition: Pending discharge to residential hospice  DO NOT RESUSCITATE   Cristy Folks MD [facility]   If 7PM-7AM, please contact night-coverage [email or web address] Password [facility]  08/13/2018, 5:28 PM

## 2018-08-13 NOTE — Clinical Social Work Note (Addendum)
Notified by RN CM that pt in need of referral to Hospice and she is GIP/Residential Hospice appropriate. Met with pt and her daughter this AM to discuss. Pt's daughter states that they would like [facility]. Referral sent. Will follow and assist as needed.  2:00 Received call from [place] at hospice stating that they will need to do a GIP bed for pt and someone from hospice will be to the hospital this afternoon to meet with pt and her daughter and to complete the process for GIP status. Will update MD.

## 2018-08-13 NOTE — Discharge Instructions (Signed)
Hospice Introduction Hospice is a service that is designed to provide people who are terminally ill and their families with medical, spiritual, and psychological support. Its aim is to improve your quality of life by keeping you as alert and comfortable as possible. Who will be my providers when I begin hospice care? Hospice teams often include:  A nurse.  A doctor. The hospice doctor will be available for your care, but you can bring your regular doctor or nurse practitioner.  Social workers.  Religious leaders (such as a Clinical biochemist).  Trained volunteers.  What roles will providers play in my care? Hospice is performed by a team of health care professionals and volunteers who:  Help keep you comfortable: ? Hospice can be provided in your home or in a homelike setting. ? The hospice staff works with your family and friends to help meet your needs. ? You will enjoy the support of loved ones by receiving much of your basic care from family and friends.  Provide pain relief and manage your symptoms. The staff supply all necessary medicines and equipment.  Provide companionship when you are alone.  Allow you and your family to rest. They may do light housekeeping, prepare meals, and run errands.  Provide counseling. They will make sure your emotional, spiritual, and social needs and those of your family are being met.  Provide spiritual care: ? Spiritual care will be individualized to meet your needs and your family's needs. ? Spiritual care may involve:  Helping you look at what death means to you.  Helping you say goodbye to your family and friends.  Performing a specific religious ceremony or ritual.  When should hospice care begin? Most people who use hospice are believed to have fewer than 6 months to live.  Your family and health care providers can help you decide when hospice services should begin.  If your condition improves, you may discontinue the program.  What  should I consider before selecting a program? Most hospice programs are run by nonprofit, independent organizations. Some are affiliated with hospitals, nursing homes, or home health care agencies. Hospice programs can take place in the home or at a hospice center, hospital, or skilled nursing facility. When choosing a hospice program, ask the following questions:  What services are available to me?  What services will be offered to my loved ones?  How involved will my loved ones be?  How involved will my health care provider be?  Who makes up the hospice care team? How are they trained or screened?  How will my pain and symptoms be managed?  If my circumstances change, can the services be provided in a different setting, such as my home or in the hospital?  Is the program reviewed and licensed by the state or certified in some other way?  Where can I learn more about hospice? You can learn about existing hospice programs in your area from your health care providers. You can also read more about hospice online. The websites of the following organizations contain helpful information:  The [facility] and Palliative Care Organization [facility]).  The Hospice Association of America ([place]).  The [place].  The American Cancer Society (ACS).  Hospice Net.  This information is not intended to replace advice given to you by your health care provider. Make sure you discuss any questions you have with your health care provider. Document Released: 01/09/2004 Document Revised: 05/08/2016 Document Reviewed: 08/02/2013 Elsevier Interactive Patient Education  2017 [employer].

## 2018-08-13 NOTE — Discharge Summary (Signed)
Physician Discharge Summary  Kara Santos DTO:[id number] DOB: [birth year] DOA: 08/10/2018  PCP: Sinda Du, MD  Admit date: 08/10/2018 Discharge date: 08/13/2018  Admitted From: ALF  Disposition:  Residential Hospice  Recommendations for Outpatient Follow-up:  1. Please let your provider know if you have any pain, difficulty breathing, shortness of breath.  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  She was admitted with anemia and melanotic stools.  She was given packed red blood cells and a PPI drip.  Her hemoglobin stabilized and she had no more anemia.  She was not a candidate for any aggressive interventions.  #) Dementia/failure to thrive: Patient was admitted with severe dementia and failure to thrive.  She has had dramatic weight loss.  Her functional status is quite poor.  Her daughters are decided for no aggressive treatment except to transition the patient to hospice.  She is currently being admitted to inpatient hospice after discharge.  #) Hypernatremia: This was in the setting of decreased free water intake and increase free water losses.  She was given half-normal saline.  This improved on discharge.  #) Sepsis from unclear source: Patient was noted to have a UTI positive for enterococcus as well as unstageable heel and sacral ulcers.  Patient was given empiric IV ceftriaxone and vancomycin.  Blood cultures from 08/10/2018 were negative.  It was decided to transition the patient to inpatient hospice.  Antibiotics were stopped.  #) Type 2 diabetes: Patient was on metformin at her assisted living facility.  She was maintained on sliding scale here.  On discharge her metformin was discontinued.  On admission to hospital she does not need blood sugar  checked.  #) Hypothyroidism: Patient was continued on home levothyroxine here.  This can be discontinued as she was on quite a small dose and it is unlikely to improve her quality of life.  #) Chronic grade 1 diastolic dysfunction: Patient home furosemide dose was held due to her hyponatremia and sepsis.  This can be discontinued on discharge.  #) Pain/psych: Patient was continued on home sertraline to prevent withdrawal.  She may continue this on hospice to prevent SSRI withdrawal.  She is continued on PRN tramadol.  She may continue this at hospice.  Discharge Diagnoses:  Principal Problem:   Gastrointestinal hemorrhage with melena Active Problems:   Acute blood loss anemia   Diabetes mellitus type 2 in nonobese (HCC)   Dementia without behavioral disturbance (HCC)   Acute lower UTI   Sacral decubitus ulcer   Decubitus ulcer of heel   Lactic acidosis   Failure to thrive in adult    Discharge Instructions  Discharge Instructions    Call MD for:  persistant nausea and vomiting   Complete by:  As directed    Call MD for:  severe uncontrolled pain   Complete by:  As directed    Diet - low sodium heart healthy   Complete by:  As directed    Discharge instructions   Complete by:  As directed    Please let your provider know if you are in any pain.   Increase activity slowly   Complete by:  As directed      Allergies as of 08/13/2018      Reactions   Asa [aspirin]    Acid reflux  Medication List    STOP taking these medications   cyanocobalamin 100 MCG tablet   ferrous sulfate 325 (65 FE) MG tablet   levothyroxine 25 MCG tablet Commonly known as:  SYNTHROID, LEVOTHROID   medroxyPROGESTERone 2.5 MG tablet Commonly known as:  PROVERA   metFORMIN 500 MG 24 hr tablet Commonly known as:  GLUCOPHAGE-XR   multivitamin tablet   potassium chloride SA 20 MEQ tablet Commonly known as:  K-DUR,KLOR-CON     TAKE these medications   acetaminophen 500 MG  tablet Commonly known as:  TYLENOL Take 1,000 mg by mouth 3 (three) times daily.   benzocaine-resorcinol 5-2 % vaginal cream Commonly known as:  VAGISIL Place 2 g vaginally daily as needed for itching or irritation.   furosemide 40 MG tablet Commonly known as:  LASIX Take 40 mg by mouth daily.   glycopyrrolate 0.2 MG/ML injection Commonly known as:  ROBINUL Inject 1 mL (0.2 mg total) into the vein every 4 (four) hours as needed (secretions, gurgling).   guaiFENesin-dextromethorphan 100-10 MG/5ML syrup Commonly known as:  ROBITUSSIN DM Take 10 mLs by mouth every 6 (six) hours as needed for cough.   loperamide 2 MG tablet Commonly known as:  IMODIUM A-D Take 2 mg by mouth every 2 (two) hours as needed for diarrhea or loose stools.   meclizine 25 MG tablet Commonly known as:  ANTIVERT Take 25 mg by mouth 3 (three) times daily.   ondansetron 4 MG tablet Commonly known as:  ZOFRAN Take 4 mg by mouth 4 (four) times daily as needed for nausea or vomiting.   pantoprazole 40 MG tablet Commonly known as:  PROTONIX Take 40 mg by mouth daily.   polyvinyl alcohol 1.4 % ophthalmic solution Commonly known as:  LIQUIFILM TEARS Place 1 drop into both eyes 4 (four) times daily as needed for dry eyes.   sertraline 100 MG tablet Commonly known as:  ZOLOFT Take 1 tablet by mouth daily.   traMADol 50 MG tablet Commonly known as:  ULTRAM Take 50 mg by mouth every 6 (six) hours as needed for moderate pain. Pain not relieved by Tylenol.       Allergies  Allergen Reactions  . Asa [Aspirin]     Acid reflux     Consultations:  Palliative care   Procedures/Studies: Dg Chest 1 View  Result Date: 08/10/2018 CLINICAL DATA:  Initial evaluation for acute tachycardia, weakness. EXAM: CHEST  1 VIEW COMPARISON:  Prior radiograph from 08/07/2015. FINDINGS: Cardiac and mediastinal silhouettes are stable in size and contour, and remain within normal limits. Patient markedly rotated to the  right. Lungs hypoinflated. Asymmetric hazy opacity overlying the right upper lobe favored to be related to technique/patient positioning, although infiltrate not entirely excluded. No other focal airspace disease. No pulmonary edema or pleural effusion. No pneumothorax. Diffuse osteopenia.  Known left shoulder fracture noted. IMPRESSION: 1. Asymmetric hazy opacity overlying the right upper lobe, favored to be related to patient rotation/technique. Superimposed infiltrate not entirely excluded. Correlation with physical exam and symptomatology recommended. 2. Low lung volumes with no other active cardiopulmonary disease identified. Electronically Signed   By: Jeannine Boga M.D.   On: 08/10/2018 19:43   Dg Pelvis 1-2 Views  Result Date: 08/10/2018 CLINICAL DATA:  Initial evaluation for decubitus sacral wound. EXAM: PELVIS - 1-2 VIEW COMPARISON:  None. FINDINGS: No acute fracture or dislocation. Femoral heads in normal alignment within the acetabula. Bony pelvis intact. SI joints approximated. No pubic diastasis. Osteoarthritic changes noted about the hips  bilaterally. Degenerative changes noted within lower lumbar spine. No acute soft tissue abnormality. Known sacral wound not well delineated. No radiographic evidence for active osteomyelitis. IMPRESSION: No acute osseous abnormality about the pelvis. Electronically Signed   By: Jeannine Boga M.D.   On: 08/10/2018 19:45   Dg Shoulder Left  Result Date: 08/05/2018 Clinical:  History of left shoulder fracture X-rays were done of the left shoulder, two views. There is a healing comminuted fracture of the left proximal humerus.  The humerus is within the glenoid area.  Bone quality is good. Impression: healing comminuted fracture of the left proximal humerus. Electronically Signed Sanjuana Kava, MD 10/31/20199:56 AM      Subjective:   Discharge Exam: Vitals:   08/12/18 2314 08/13/18 1332  BP: 113/70 108/63  Pulse: 86 92  Resp: 16    Temp: 98.5 F (36.9 C) 97.9 F (36.6 C)  SpO2: 100% 98%   Vitals:   08/12/18 1807 08/12/18 1926 08/12/18 2314 08/13/18 1332  BP: 109/81  113/70 108/63  Pulse: 86  86 92  Resp: 18  16   Temp:   98.5 F (36.9 C) 97.9 F (36.6 C)  TempSrc:   Oral Oral  SpO2: 97% 93% 100% 98%  Weight:      Height:       General exam: Lying in bed in no acute distress, minimally arousable Respiratory system: No increased work of breathing, anterior lung sounds clear. Cardiovascular system: Regular rate and rhythm, no murmurs Gastrointestinal system: Abdomen is nondistended, soft and nontender. No organomegaly or masses felt. Normal bowel sounds heard. Central nervous system: Not alert or oriented, wakes up only to noxious stimuli Extremities: No lower extremity edema. Skin: No rashes over visible skin Psychiatry: Unable to assess due to medical condition   The results of significant diagnostics from this hospitalization (including imaging, microbiology, ancillary and laboratory) are listed below for reference.     Microbiology: Recent Results (from the past 240 hour(s))  Culture, Urine     Status: Abnormal   Collection Time: 08/09/18  7:45 AM  Result Value Ref Range Status   Specimen Description   Final    URINE, CLEAN CATCH Performed at [facility], [address] [place], [place] [address]    Special Requests   Final    NONE Performed at [facility], [address], [place], [place] [address]    Culture >=100,000 COLONIES/mL ENTEROCOCCUS FAECALIS (A)  Final   Report Status 08/12/2018 FINAL  Final   Organism ID, Bacteria ENTEROCOCCUS FAECALIS (A)  Final      Susceptibility   Enterococcus faecalis - MIC*    AMPICILLIN <=2 SENSITIVE Sensitive     LEVOFLOXACIN >=8 RESISTANT Resistant     NITROFURANTOIN <=16 SENSITIVE Sensitive     VANCOMYCIN 1 SENSITIVE Sensitive     * >=100,000 COLONIES/mL ENTEROCOCCUS FAECALIS  Culture, blood (routine x 2)     Status: None (Preliminary result)    Collection Time: 08/10/18  8:17 PM  Result Value Ref Range Status   Specimen Description BLOOD RIGHT FOREARM  Final   Special Requests   Final    BOTTLES DRAWN AEROBIC AND ANAEROBIC Blood Culture adequate volume   Culture   Final    NO GROWTH 3 DAYS Performed at [facility], [address] [place],  [address]    Report Status PENDING  Incomplete  MRSA PCR Screening     Status: Abnormal   Collection Time: 08/10/18  9:23 PM  Result Value Ref Range  Status   MRSA by PCR POSITIVE (A) NEGATIVE Final    Comment:        The GeneXpert MRSA Assay (FDA approved for NASAL specimens only), is one component of a comprehensive MRSA colonization surveillance program. It is not intended to diagnose MRSA infection nor to guide or monitor treatment for MRSA infections. RESULT CALLED TO, READ BACK BY AND VERIFIED WITH: NURSE JESSICA AT 0458 ON 08/11/2018 BY [name] Performed at [facility], [address], [place], [place] [address]   Culture, blood (routine x 2)     Status: None (Preliminary result)   Collection Time: 08/10/18 11:37 PM  Result Value Ref Range Status   Specimen Description BLOOD LEFT FOREARM  Final   Special Requests   Final    BOTTLES DRAWN AEROBIC AND ANAEROBIC Blood Culture adequate volume   Culture   Final    NO GROWTH 3 DAYS Performed at [facility], [address], [place], [place] [address]    Report Status PENDING  Incomplete     Labs: BNP (last 3 results) No results for input(s): BNP in the last 8760 hours. Basic Metabolic Panel: Recent Labs  Lab 08/10/18 1850 08/11/18 1313 08/12/18 0407 08/13/18 0513  NA 141 152* 153* 148*  K 3.7 2.9* 2.4* 3.0*  CL 110 119* 122* 121*  CO2 13* 19* 17* 19*  GLUCOSE 320* 113* 213* 189*  BUN 40* 37* 28* 18  CREATININE 1.24* 1.18* 1.02* 0.77  CALCIUM 7.3* 7.6* 7.2* 6.8*  MG  --   --  1.3* 1.8  PHOS  --   --  2.9  --    Liver Function Tests: Recent Labs  Lab 08/10/18 1850  AST 17  ALT 12  ALKPHOS 99  BILITOT  0.5  PROT 5.2*  ALBUMIN 2.1*   No results for input(s): LIPASE, AMYLASE in the last 168 hours. No results for input(s): AMMONIA in the last 168 hours. CBC: Recent Labs  Lab 08/10/18 1850 08/11/18 1313 08/12/18 0407 08/13/18 0513  WBC 18.3* 21.0* 18.7* 12.5*  NEUTROABS 16.8*  --   --   --   HGB 5.3* 13.4 13.7 13.6  HCT 20.4* 44.4 43.5 45.2  MCV 93.2 91.7 90.8 91.9  PLT 263 255 223 205   Cardiac Enzymes: No results for input(s): CKTOTAL, CKMB, CKMBINDEX, TROPONINI in the last 168 hours. BNP: Invalid input(s): POCBNP CBG: Recent Labs  Lab 08/11/18 1938 08/11/18 2358 08/12/18 0359 08/12/18 0813 08/12/18 1137  GLUCAP 119* 145* 179* 109* 172*   D-Dimer No results for input(s): DDIMER in the last 72 hours. Hgb A1c No results for input(s): HGBA1C in the last 72 hours. Lipid Profile No results for input(s): CHOL, HDL, LDLCALC, TRIG, CHOLHDL, LDLDIRECT in the last 72 hours. Thyroid function studies No results for input(s): TSH, T4TOTAL, T3FREE, THYROIDAB in the last 72 hours.  Invalid input(s): FREET3 Anemia work up No results for input(s): VITAMINB12, FOLATE, FERRITIN, TIBC, IRON, RETICCTPCT in the last 72 hours. Urinalysis    Component Value Date/Time   COLORURINE YELLOW 08/09/2018 0745   APPEARANCEUR HAZY (A) 08/09/2018 0745   LABSPEC 1.015 08/09/2018 0745   PHURINE 5.0 08/09/2018 0745   GLUCOSEU NEGATIVE 08/09/2018 0745   HGBUR MODERATE (A) 08/09/2018 0745   BILIRUBINUR NEGATIVE 08/09/2018 0745   KETONESUR NEGATIVE 08/09/2018 0745   PROTEINUR NEGATIVE 08/09/2018 0745   UROBILINOGEN 0.2 08/07/2015 1235   NITRITE NEGATIVE 08/09/2018 0745   LEUKOCYTESUR MODERATE (A) 08/09/2018 0745   Sepsis Labs Invalid input(s): PROCALCITONIN,  WBC,  [place] [facility] Recent Results (from the past 240 hour(s))  Culture, Urine     Status: Abnormal   Collection Time: 08/09/18  7:45 AM  Result Value Ref Range Status   Specimen Description   Final    URINE, CLEAN  CATCH Performed at [facility], [address] [place], [place] [address]    Special Requests   Final    NONE Performed at [facility], [address], [place], [place] [address]    Culture >=100,000 COLONIES/mL ENTEROCOCCUS FAECALIS (A)  Final   Report Status 08/12/2018 FINAL  Final   Organism ID, Bacteria ENTEROCOCCUS FAECALIS (A)  Final      Susceptibility   Enterococcus faecalis - MIC*    AMPICILLIN <=2 SENSITIVE Sensitive     LEVOFLOXACIN >=8 RESISTANT Resistant     NITROFURANTOIN <=16 SENSITIVE Sensitive     VANCOMYCIN 1 SENSITIVE Sensitive     * >=100,000 COLONIES/mL ENTEROCOCCUS FAECALIS  Culture, blood (routine x 2)     Status: None (Preliminary result)   Collection Time: 08/10/18  8:17 PM  Result Value Ref Range Status   Specimen Description BLOOD RIGHT FOREARM  Final   Special Requests   Final    BOTTLES DRAWN AEROBIC AND ANAEROBIC Blood Culture adequate volume   Culture   Final    NO GROWTH 3 DAYS Performed at [facility], [address] [place], [place] [address]    Report Status PENDING  Incomplete  MRSA PCR Screening     Status: Abnormal   Collection Time: 08/10/18  9:23 PM  Result Value Ref Range Status   MRSA by PCR POSITIVE (A) NEGATIVE Final    Comment:        The GeneXpert MRSA Assay (FDA approved for NASAL specimens only), is one component of a comprehensive MRSA colonization surveillance program. It is not intended to diagnose MRSA infection nor to guide or monitor treatment for MRSA infections. RESULT CALLED TO, READ BACK BY AND VERIFIED WITH: NURSE JESSICA AT 0458 ON 08/11/2018 BY [name] Performed at [facility], [address] [place], [place] [address]   Culture, blood (routine x 2)     Status: None (Preliminary result)   Collection Time: 08/10/18 11:37 PM  Result Value Ref Range Status   Specimen Description BLOOD LEFT FOREARM  Final   Special Requests   Final    BOTTLES DRAWN AEROBIC AND ANAEROBIC Blood Culture adequate volume   Culture    Final    NO GROWTH 3 DAYS Performed at [facility], [address], [place], [place] [address]    Report Status PENDING  Incomplete     Time coordinating discharge: 76  SIGNED:   Cristy Folks, MD  [facility] 08/13/2018, 3:31 PM  If 7PM-7AM, please contact night-coverage [email or web address] Password [email or web address]

## 2018-08-13 NOTE — Progress Notes (Signed)
Palliative:  Kara Santos is more alert today although confused as this is her baseline with dementia. She is requesting to go to the bathroom and struggling to comprehend using the bedpan. She ate better this morning for breakfast 25% of meal and drinking all her fluids. Took pills well without problem per RN. Continues to have dark, tarry stools although less frequently and Hgb has remained stable for now.   I have spoken with daughter, [name]. [name] has spoken with her friends and thought about what is best for her mother. She shares that she does believe hospice facility would be the best option for her mother. [name] was confused when she noted how alert her mother is today. We discussed that overall [name]'s wounds will continue to be infected and her prognosis remains very poor. [name] has been unable to maintain potassium and family have opted to stop blood transfusions, labs, and opted for comfort care. I still believe prognosis is poor and she is eligible for hospice facility with prognosis likely < 2-3 weeks.   Exam: Alert, confused. No distress. Sacral and bilat heel wounds covered. Sacrum is severely excoriated.   25 min  Vinie Sill, NP Palliative Medicine Team Pager # [phone] (M-F 8a-5p) Team Phone # [phone] (Nights/Weekends)

## 2018-08-13 NOTE — Progress Notes (Signed)
PROGRESS NOTE    Kara Santos  WCH:[id number] DOB: 1916-12-11 DOA: 08/10/2018 PCP: Sinda Du, MD    Brief Narrative:  [age]-year-old with past medical history relevant for dementia, recent left shoulder fracture, type 2 diabetes, chronic grade 1 diastolic dysfunction by echo on 08/09/2015, history of stomach AVMs with hemorrhage, iron deficiency anemia admitted from assisted living facility [facility] with pressure ulcers on right heel and sacrum, UTI, melena and anemia.   Assessment & Plan:   Principal Problem:   Gastrointestinal hemorrhage with melena Active Problems:   Acute blood loss anemia   Diabetes mellitus type 2 in nonobese (HCC)   Dementia without behavioral disturbance (HCC)   Acute lower UTI   Sacral decubitus ulcer   Decubitus ulcer of heel   Lactic acidosis   Failure to thrive in adult  #) Dementia/failure to thrive: Patient apparently is only been taking some liquids and no solids.  She is a dramatic failure to thrive and loss of weight.  Her functional status is quite poor. -Palliative care consult, daughters decided no aggressive treatments except antibiotics and transfusions  #) Melena/acute blood loss anemia from bleeding stomach AVM: Resolved.  Hemoglobin is currently stable. -Continue PPI drip - Status post 3 units packed red blood cells  #) Hypernatremia: Improving with free water -Half-normal saline  #) Sepsis from unclear source: Patient's 2 sources are possible enterococcus UTI versus heel and sacral unstageable ulcers.  At this time the patient is not improved with empiric antibiotics. - Discontinue IV ceftriaxone started 08/10/2018 -Discontinue IV vancomycin started 08/10/2018  -blood cultures from 08/10/2018- -08/09/2018 urine culture growing Enterococcus faecalis  #) Type 2 diabetes: - Hold metformin 500 mg every morning -Sliding scale insulin, SHS  #) Hypothyroidism: -Continue levothyroxine 25 mcg daily  #) Chronic grade 1 diastolic  dysfunction: -Hold furosemide 40 mg daily  #) Pain/psych: -Continue sertraline 100 mg daily -Continue PRN tramadol -Continue PRN meclizine  Fluids: Half-normal saline Electro lites: Monitor and supplement Nutrition: Regular diet  Prophylaxis: SCDs  Disposition: Likely discharge to hospice  Full code   Consultants:   Palliative care  Procedures:   None  Antimicrobials:   IV vancomycin started 08/10/2018 to 08/13/2018  IV ceftriaxone started 08/10/2018 to 08/13/2018   Subjective: Patient is minimally arousable this morning.  She does not appear to be uncomfortable.  She has had minimal amounts of p.o. intake.  Objective: Vitals:   08/12/18 1631 08/12/18 1807 08/12/18 1926 08/12/18 2314  BP:  109/81  113/70  Pulse:  86  86  Resp:  18  16  Temp: 97.8 F (36.6 C)   98.5 F (36.9 C)  TempSrc: Oral   Oral  SpO2:  97% 93% 100%  Weight:      Height:        Intake/Output Summary (Last 24 hours) at 08/13/2018 1015 Last data filed at 08/13/2018 0322 Gross per 24 hour  Intake 1553.2 ml  Output -  Net 1553.2 ml   Filed Weights   08/10/18 2156 08/11/18 0343 08/12/18 0500  Weight: 33.8 kg 34 kg 35.7 kg    Examination:  General exam: Lying in bed in no acute distress, minimally arousable Respiratory system: No increased work of breathing, anterior lung sounds clear. Cardiovascular system: Regular rate and rhythm, no murmurs Gastrointestinal system: Abdomen is nondistended, soft and nontender. No organomegaly or masses felt. Normal bowel sounds heard. Central nervous system: Not alert or oriented, wakes up only to noxious stimuli Extremities: No lower extremity edema. Skin: No  rashes over visible skin Psychiatry: Unable to assess due to medical condition    Data Reviewed: I have personally reviewed following labs and imaging studies  CBC: Recent Labs  Lab 08/10/18 1850 08/11/18 1313 08/12/18 0407 08/13/18 0513  WBC 18.3* 21.0* 18.7* 12.5*  NEUTROABS  16.8*  --   --   --   HGB 5.3* 13.4 13.7 13.6  HCT 20.4* 44.4 43.5 45.2  MCV 93.2 91.7 90.8 91.9  PLT 263 255 223 623   Basic Metabolic Panel: Recent Labs  Lab 08/10/18 1850 08/11/18 1313 08/12/18 0407 08/13/18 0513  NA 141 152* 153* 148*  K 3.7 2.9* 2.4* 3.0*  CL 110 119* 122* 121*  CO2 13* 19* 17* 19*  GLUCOSE 320* 113* 213* 189*  BUN 40* 37* 28* 18  CREATININE 1.24* 1.18* 1.02* 0.77  CALCIUM 7.3* 7.6* 7.2* 6.8*  MG  --   --  1.3* 1.8  PHOS  --   --  2.9  --    GFR: Estimated Creatinine Clearance: 21.6 mL/min (by C-G formula based on SCr of 0.77 mg/dL). Liver Function Tests: Recent Labs  Lab 08/10/18 1850  AST 17  ALT 12  ALKPHOS 99  BILITOT 0.5  PROT 5.2*  ALBUMIN 2.1*   No results for input(s): LIPASE, AMYLASE in the last 168 hours. No results for input(s): AMMONIA in the last 168 hours. Coagulation Profile: Recent Labs  Lab 08/11/18 1313  INR 1.21   Cardiac Enzymes: No results for input(s): CKTOTAL, CKMB, CKMBINDEX, TROPONINI in the last 168 hours. BNP (last 3 results) No results for input(s): PROBNP in the last 8760 hours. HbA1C: No results for input(s): HGBA1C in the last 72 hours. CBG: Recent Labs  Lab 08/11/18 1938 08/11/18 2358 08/12/18 0359 08/12/18 0813 08/12/18 1137  GLUCAP 119* 145* 179* 109* 172*   Lipid Profile: No results for input(s): CHOL, HDL, LDLCALC, TRIG, CHOLHDL, LDLDIRECT in the last 72 hours. Thyroid Function Tests: No results for input(s): TSH, T4TOTAL, FREET4, T3FREE, THYROIDAB in the last 72 hours. Anemia Panel: No results for input(s): VITAMINB12, FOLATE, FERRITIN, TIBC, IRON, RETICCTPCT in the last 72 hours. Sepsis Labs: Recent Labs  Lab 08/10/18 1850 08/10/18 2017 08/10/18 2351 08/12/18 0407  LATICACIDVEN 13.8* 3.7* 5.8* 1.1    Recent Results (from the past 240 hour(s))  Culture, Urine     Status: Abnormal   Collection Time: 08/09/18  7:45 AM  Result Value Ref Range Status   Specimen Description   Final      URINE, CLEAN CATCH Performed at [facility], [address], [place], [place] [address]    Special Requests   Final    NONE Performed at [facility], [address] [place], [place] [address]    Culture >=100,000 COLONIES/mL ENTEROCOCCUS FAECALIS (A)  Final   Report Status 08/12/2018 FINAL  Final   Organism ID, Bacteria ENTEROCOCCUS FAECALIS (A)  Final      Susceptibility   Enterococcus faecalis - MIC*    AMPICILLIN <=2 SENSITIVE Sensitive     LEVOFLOXACIN >=8 RESISTANT Resistant     NITROFURANTOIN <=16 SENSITIVE Sensitive     VANCOMYCIN 1 SENSITIVE Sensitive     * >=100,000 COLONIES/mL ENTEROCOCCUS FAECALIS  Culture, blood (routine x 2)     Status: None (Preliminary result)   Collection Time: 08/10/18  8:17 PM  Result Value Ref Range Status   Specimen Description BLOOD RIGHT FOREARM  Final   Special Requests   Final    BOTTLES DRAWN AEROBIC AND ANAEROBIC  Blood Culture adequate volume   Culture   Final    NO GROWTH 3 DAYS Performed at [facility], [address], [place], [place] [address]    Report Status PENDING  Incomplete  MRSA PCR Screening     Status: Abnormal   Collection Time: 08/10/18  9:23 PM  Result Value Ref Range Status   MRSA by PCR POSITIVE (A) NEGATIVE Final    Comment:        The GeneXpert MRSA Assay (FDA approved for NASAL specimens only), is one component of a comprehensive MRSA colonization surveillance program. It is not intended to diagnose MRSA infection nor to guide or monitor treatment for MRSA infections. RESULT CALLED TO, READ BACK BY AND VERIFIED WITH: NURSE JESSICA AT 0458 ON 08/11/2018 BY [name] Performed at [facility], [address] [place], [place] [address]   Culture, blood (routine x 2)     Status: None (Preliminary result)   Collection Time: 08/10/18 11:37 PM  Result Value Ref Range Status   Specimen Description BLOOD LEFT FOREARM  Final   Special Requests   Final    BOTTLES DRAWN AEROBIC AND ANAEROBIC Blood Culture adequate  volume   Culture   Final    NO GROWTH 3 DAYS Performed at [facility], [address] [place], [place] [address]    Report Status PENDING  Incomplete         Radiology Studies: No results found.      Scheduled Meds: . Chlorhexidine Gluconate Cloth  6 each Topical Q0600  . ferrous sulfate  325 mg Oral Q breakfast  . levothyroxine  25 mcg Oral QAC breakfast  . meclizine  25 mg Oral TID  . multivitamin with minerals  1 tablet Oral Daily  . mupirocin ointment  1 application Nasal BID  . [START ON 08/14/2018] pantoprazole  40 mg Intravenous Q12H  . sertraline  100 mg Oral Daily  . vitamin B-12  100 mcg Oral Daily   Continuous Infusions: . sodium chloride 75 mL/hr at 08/13/18 0419  . sodium chloride 10 mL/hr at 08/12/18 0339  . cefTRIAXone (ROCEPHIN)  IV Stopped (08/12/18 1738)  . pantoprozole (PROTONIX) infusion 8 mg/hr (08/13/18 0322)  . vancomycin Stopped (08/12/18 2127)     LOS: 3 days    Time spent: [place], MD [facility]  If 7PM-7AM, please contact night-coverage [email or web address] Password [email or web address] 08/13/2018, 10:15 AM

## 2018-08-14 NOTE — Progress Notes (Signed)
Subjective: She is not responsive this morning.  Objective: Vital signs in last 24 hours: Temp:  [97.9 F (36.6 C)-98.3 F (36.8 C)] 98.3 F (36.8 C) (11/09 0536) Pulse Rate:  [81-92] 81 (11/09 0536) Resp:  [16] 16 (11/09 0536) BP: (99-109)/(60-63) 109/60 (11/09 0536) SpO2:  [65 %-99 %] 65 % (11/09 0536) Weight change:     Intake/Output from previous day: No intake/output data recorded.  PHYSICAL EXAM Unresponsive.  Looks comfortable  Lab Results:  Results for orders placed or performed during the hospital encounter of 08/10/18 (from the past 48 hour(s))  Glucose, capillary     Status: Abnormal   Collection Time: 08/12/18 11:37 AM  Result Value Ref Range   Glucose-Capillary 172 (H) 70 - 99 mg/dL  CBC     Status: Abnormal   Collection Time: 08/13/18  5:13 AM  Result Value Ref Range   WBC 12.5 (H) 4.0 - 10.5 K/uL   RBC 4.92 3.87 - 5.11 MIL/uL   Hemoglobin 13.6 12.0 - 15.0 g/dL   HCT 45.2 36.0 - 46.0 %   MCV 91.9 80.0 - 100.0 fL   MCH 27.6 26.0 - 34.0 pg   MCHC 30.1 30.0 - 36.0 g/dL   RDW 19.4 (H) 11.5 - 15.5 %   Platelets 205 150 - 400 K/uL   nRBC 0.0 0.0 - 0.2 %    Comment: Performed at [facility], [address], [place], [place] [address]  Basic metabolic panel     Status: Abnormal   Collection Time: 08/13/18  5:13 AM  Result Value Ref Range   Sodium 148 (H) 135 - 145 mmol/L   Potassium 3.0 (L) 3.5 - 5.1 mmol/L    Comment: DELTA CHECK NOTED   Chloride 121 (H) 98 - 111 mmol/L   CO2 19 (L) 22 - 32 mmol/L   Glucose, Bld 189 (H) 70 - 99 mg/dL   BUN 18 8 - 23 mg/dL   Creatinine, Ser 0.77 0.44 - 1.00 mg/dL   Calcium 6.8 (L) 8.9 - 10.3 mg/dL   GFR calc non Af Amer >60 >60 mL/min   GFR calc Af Amer >60 >60 mL/min    Comment: (NOTE) The eGFR has been calculated using the CKD EPI equation. This calculation has not been validated in all clinical situations. eGFR's persistently <60 mL/min signify possible Chronic Kidney Disease.    Anion gap 8 5 - 15    Comment:  Performed at [facility], [address], [place], [place] [address]  Magnesium     Status: None   Collection Time: 08/13/18  5:13 AM  Result Value Ref Range   Magnesium 1.8 1.7 - 2.4 mg/dL    Comment: Performed at [facility], [address], [place], [place] [address]    ABGS No results for input(s): PHART, PO2ART, TCO2, HCO3 in the last 72 hours.  Invalid input(s): PCO2 CULTURES Recent Results (from the past 240 hour(s))  Culture, Urine     Status: Abnormal   Collection Time: 08/09/18  7:45 AM  Result Value Ref Range Status   Specimen Description   Final    URINE, CLEAN CATCH Performed at [facility], [address] [place], [place] [address]    Special Requests   Final    NONE Performed at [facility], [address], [place], [place] [address]    Culture >=100,000 COLONIES/mL ENTEROCOCCUS FAECALIS (A)  Final   Report Status 08/12/2018 FINAL  Final   Organism ID, Bacteria ENTEROCOCCUS FAECALIS (A)  Final  Susceptibility   Enterococcus faecalis - MIC*    AMPICILLIN <=2 SENSITIVE Sensitive     LEVOFLOXACIN >=8 RESISTANT Resistant     NITROFURANTOIN <=16 SENSITIVE Sensitive     VANCOMYCIN 1 SENSITIVE Sensitive     * >=100,000 COLONIES/mL ENTEROCOCCUS FAECALIS  Culture, blood (routine x 2)     Status: None (Preliminary result)   Collection Time: 08/10/18  8:17 PM  Result Value Ref Range Status   Specimen Description BLOOD RIGHT FOREARM  Final   Special Requests   Final    BOTTLES DRAWN AEROBIC AND ANAEROBIC Blood Culture adequate volume   Culture   Final    NO GROWTH 4 DAYS Performed at [facility], [address], [place], [place] [address]    Report Status PENDING  Incomplete  MRSA PCR Screening     Status: Abnormal   Collection Time: 08/10/18  9:23 PM  Result Value Ref Range Status   MRSA by PCR POSITIVE (A) NEGATIVE Final    Comment:        The GeneXpert MRSA Assay (FDA approved for NASAL specimens only), is one component of a comprehensive MRSA  colonization surveillance program. It is not intended to diagnose MRSA infection nor to guide or monitor treatment for MRSA infections. RESULT CALLED TO, READ BACK BY AND VERIFIED WITH: NURSE JESSICA AT 0458 ON 08/11/2018 BY [name] Performed at [facility], [address], [place], [place] [address]   Culture, blood (routine x 2)     Status: None (Preliminary result)   Collection Time: 08/10/18 11:37 PM  Result Value Ref Range Status   Specimen Description BLOOD LEFT FOREARM  Final   Special Requests   Final    BOTTLES DRAWN AEROBIC AND ANAEROBIC Blood Culture adequate volume   Culture   Final    NO GROWTH 4 DAYS Performed at [facility], [address] [place], [place] [address]    Report Status PENDING  Incomplete   Studies/Results: No results found.  Medications:  Prior to Admission:  Medications Prior to Admission  Medication Sig Dispense Refill Last Dose  . acetaminophen (TYLENOL) 500 MG tablet Take 1,000 mg by mouth 3 (three) times daily.   Past Week at Unknown time  . ferrous sulfate 325 (65 FE) MG tablet Take 325 mg by mouth daily with breakfast.   Past Week at Unknown time  . furosemide (LASIX) 40 MG tablet Take 40 mg by mouth daily.    Past Week at Unknown time  . guaiFENesin-dextromethorphan (ROBITUSSIN DM) 100-10 MG/5ML syrup Take 10 mLs by mouth every 6 (six) hours as needed for cough.   not in past week  . levothyroxine (SYNTHROID, LEVOTHROID) 25 MCG tablet Take 25 mcg by mouth daily before breakfast.   Past Week at Unknown time  . loperamide (IMODIUM A-D) 2 MG tablet Take 2 mg by mouth every 2 (two) hours as needed for diarrhea or loose stools.   not in past week  . meclizine (ANTIVERT) 25 MG tablet Take 25 mg by mouth 3 (three) times daily.   Past Week at Unknown time  . medroxyPROGESTERone (PROVERA) 2.5 MG tablet Take 2.5 mg by mouth daily.   Past Week at Unknown time  . metformin (FORTAMET) 500 MG (OSM) 24 hr tablet Take 500 mg by mouth daily with breakfast.    Past Week at Unknown time  . Multiple Vitamin (DAILY VITE PO) Take 1 tablet by mouth daily.   Past Week at Unknown time  . ondansetron (ZOFRAN) 4 MG  tablet Take 4 mg by mouth 4 (four) times daily as needed for nausea or vomiting.   not in past week  . pantoprazole (PROTONIX) 40 MG tablet Take 40 mg by mouth daily.   Past Week at Unknown time  . potassium chloride SA (K-DUR,KLOR-CON) 20 MEQ tablet Take 20 mEq by mouth daily.   Past Week at Unknown time  . sertraline (ZOLOFT) 100 MG tablet Take 1 tablet by mouth daily.   Past Week at Unknown time  . traMADol (ULTRAM) 50 MG tablet Take 50 mg by mouth every 6 (six) hours as needed for moderate pain. Pain not relieved by Tylenol.   Past Week at Unknown time  . vitamin B-12 (CYANOCOBALAMIN) 100 MCG tablet Take 100 mcg by mouth daily.   Past Week at Unknown time  . ciprofloxacin (CIPRO) 250 MG tablet Take 1 tablet by mouth 2 (two) times daily. Give 1 tablet by mouth two times a day for UTI for 5 days.   Has not started RX.   Scheduled: . meclizine  25 mg Oral TID  . pantoprazole  40 mg Oral Daily  . sertraline  100 mg Oral Daily  . sodium chloride flush  3 mL Intravenous Q12H   Continuous: . sodium chloride     UXY:BFXOVA chloride, acetaminophen **OR** acetaminophen, albuterol, alum & mag hydroxide-simeth, antiseptic oral rinse, baclofen, bisacodyl, diphenhydrAMINE, glycopyrrolate, glycopyrrolate **OR** glycopyrrolate **OR** glycopyrrolate, guaiFENesin-dextromethorphan, haloperidol **OR** haloperidol **OR** haloperidol lactate, HYDROmorphone (DILAUDID) injection, loperamide, LORazepam **OR** LORazepam **OR** LORazepam, morphine CONCENTRATE **OR** morphine CONCENTRATE, ondansetron **OR** ondansetron (ZOFRAN) IV, ondansetron, polyvinyl alcohol, polyvinyl alcohol, senna, sodium chloride flush, traMADol  Assesment: She was admitted with GI bleeding dementia failure to thrive.  She is not a candidate for any sort of aggressive interventions and she is  scheduled to be transferred to inpatient hospice care when a bed is available. Active Problems:   Chronic gastrointestinal bleeding   Diabetes mellitus type 2 in nonobese [facility])   Closed fracture of greater trochanter of femur ([place])   AVM (arteriovenous malformation) of stomach, acquired with hemorrhage   GI bleed   Palliative care by specialist   Dementia without behavioral disturbance ([place])   Failure to thrive in adult   Dementia [facility])    Plan: Transfer to inpatient hospice care when beds available    LOS: 1 day   Byran Bilotti L 08/14/2018, 10:51 AM

## 2018-08-14 NOTE — Progress Notes (Signed)
Called report to [facility] and removed Pt's IV and site is intact. EMS transporting pt now.

## 2018-08-14 NOTE — Progress Notes (Signed)
Nutrition Brief Note  Chart reviewed.  Pt admitted as inpatient hospice and will be discharging to residential hospice once beds become available.  No further nutrition interventions warranted at this time.  Please consult as needed.   Gaynell Face, MS, RD, LDN Inpatient Clinical Dietitian Pager: [phone] Weekend/After Hours: [phone]

## 2018-08-14 NOTE — Discharge Summary (Signed)
Physician Discharge Summary  Patient ID: Kara Santos MRN: [id number] DOB/AGE: 82/22/1918 82 y.o. Primary Care Physician:Wayde Gopaul, Percell Miller, MD Admit date: 08/13/2018 Discharge date: 08/14/2018    Discharge Diagnoses:   Active Problems:   Chronic gastrointestinal bleeding   Diabetes mellitus type 2 in nonobese [facility])   Closed fracture of greater trochanter of femur (HCC)   AVM (arteriovenous malformation) of stomach, acquired with hemorrhage   GI bleed   Palliative care by specialist   Dementia without behavioral disturbance ([place])   Failure to thrive in adult   Dementia ([place])   Allergies as of 08/14/2018      Reactions   Asa [aspirin]    Acid reflux      Medication List    STOP taking these medications   acetaminophen 500 MG tablet Commonly known as:  TYLENOL   ciprofloxacin 250 MG tablet Commonly known as:  CIPRO   DAILY VITE PO   ferrous sulfate 325 (65 FE) MG tablet   furosemide 40 MG tablet Commonly known as:  LASIX   guaiFENesin-dextromethorphan 100-10 MG/5ML syrup Commonly known as:  ROBITUSSIN DM   levothyroxine 25 MCG tablet Commonly known as:  SYNTHROID, LEVOTHROID   loperamide 2 MG tablet Commonly known as:  IMODIUM A-D   meclizine 25 MG tablet Commonly known as:  ANTIVERT   medroxyPROGESTERone 2.5 MG tablet Commonly known as:  PROVERA   metformin 500 MG (OSM) 24 hr tablet Commonly known as:  FORTAMET   ondansetron 4 MG tablet Commonly known as:  ZOFRAN   pantoprazole 40 MG tablet Commonly known as:  PROTONIX   potassium chloride SA 20 MEQ tablet Commonly known as:  K-DUR,KLOR-CON   sertraline 100 MG tablet Commonly known as:  ZOLOFT   traMADol 50 MG tablet Commonly known as:  ULTRAM   vitamin B-12 100 MCG tablet Commonly known as:  CYANOCOBALAMIN       Discharged Condition: Unchanged  Consults: Palliative care  Significant Diagnostic Studies: Dg Chest 1 View  Result Date: 08/10/2018 CLINICAL DATA:  Initial evaluation for  acute tachycardia, weakness. EXAM: CHEST  1 VIEW COMPARISON:  Prior radiograph from 08/07/2015. FINDINGS: Cardiac and mediastinal silhouettes are stable in size and contour, and remain within normal limits. Patient markedly rotated to the right. Lungs hypoinflated. Asymmetric hazy opacity overlying the right upper lobe favored to be related to technique/patient positioning, although infiltrate not entirely excluded. No other focal airspace disease. No pulmonary edema or pleural effusion. No pneumothorax. Diffuse osteopenia.  Known left shoulder fracture noted. IMPRESSION: 1. Asymmetric hazy opacity overlying the right upper lobe, favored to be related to patient rotation/technique. Superimposed infiltrate not entirely excluded. Correlation with physical exam and symptomatology recommended. 2. Low lung volumes with no other active cardiopulmonary disease identified. Electronically Signed   By: Jeannine Boga M.D.   On: 08/10/2018 19:43   Dg Pelvis 1-2 Views  Result Date: 08/10/2018 CLINICAL DATA:  Initial evaluation for decubitus sacral wound. EXAM: PELVIS - 1-2 VIEW COMPARISON:  None. FINDINGS: No acute fracture or dislocation. Femoral heads in normal alignment within the acetabula. Bony pelvis intact. SI joints approximated. No pubic diastasis. Osteoarthritic changes noted about the hips bilaterally. Degenerative changes noted within lower lumbar spine. No acute soft tissue abnormality. Known sacral wound not well delineated. No radiographic evidence for active osteomyelitis. IMPRESSION: No acute osseous abnormality about the pelvis. Electronically Signed   By: Jeannine Boga M.D.   On: 08/10/2018 19:45   Dg Shoulder Left  Result Date: 08/05/2018 Clinical:  History  of left shoulder fracture X-rays were done of the left shoulder, two views. There is a healing comminuted fracture of the left proximal humerus.  The humerus is within the glenoid area.  Bone quality is good. Impression: healing  comminuted fracture of the left proximal humerus. Electronically Signed Sanjuana Kava, MD 10/31/20199:56 AM    Lab Results: Basic Metabolic Panel: Recent Labs    08/12/18 0407 08/13/18 0513  NA 153* 148*  K 2.4* 3.0*  CL 122* 121*  CO2 17* 19*  GLUCOSE 213* 189*  BUN 28* 18  CREATININE 1.02* 0.77  CALCIUM 7.2* 6.8*  MG 1.3* 1.8  PHOS 2.9  --    Liver Function Tests: No results for input(s): AST, ALT, ALKPHOS, BILITOT, PROT, ALBUMIN in the last 72 hours.   CBC: Recent Labs    08/12/18 0407 08/13/18 0513  WBC 18.7* 12.5*  HGB 13.7 13.6  HCT 43.5 45.2  MCV 90.8 91.9  PLT 223 205    Recent Results (from the past 240 hour(s))  Culture, Urine     Status: Abnormal   Collection Time: 08/09/18  7:45 AM  Result Value Ref Range Status   Specimen Description   Final    URINE, CLEAN CATCH Performed at [facility], [address] [place], [place] [address]    Special Requests   Final    NONE Performed at [facility], [address] [place], [place] [address]    Culture >=100,000 COLONIES/mL ENTEROCOCCUS FAECALIS (A)  Final   Report Status 08/12/2018 FINAL  Final   Organism ID, Bacteria ENTEROCOCCUS FAECALIS (A)  Final      Susceptibility   Enterococcus faecalis - MIC*    AMPICILLIN <=2 SENSITIVE Sensitive     LEVOFLOXACIN >=8 RESISTANT Resistant     NITROFURANTOIN <=16 SENSITIVE Sensitive     VANCOMYCIN 1 SENSITIVE Sensitive     * >=100,000 COLONIES/mL ENTEROCOCCUS FAECALIS  Culture, blood (routine x 2)     Status: None (Preliminary result)   Collection Time: 08/10/18  8:17 PM  Result Value Ref Range Status   Specimen Description BLOOD RIGHT FOREARM  Final   Special Requests   Final    BOTTLES DRAWN AEROBIC AND ANAEROBIC Blood Culture adequate volume   Culture   Final    NO GROWTH 4 DAYS Performed at [facility], [address] [place], [place] [address]    Report Status PENDING  Incomplete  MRSA PCR Screening     Status: Abnormal   Collection Time: 08/10/18   9:23 PM  Result Value Ref Range Status   MRSA by PCR POSITIVE (A) NEGATIVE Final    Comment:        The GeneXpert MRSA Assay (FDA approved for NASAL specimens only), is one component of a comprehensive MRSA colonization surveillance program. It is not intended to diagnose MRSA infection nor to guide or monitor treatment for MRSA infections. RESULT CALLED TO, READ BACK BY AND VERIFIED WITH: NURSE JESSICA AT 0458 ON 08/11/2018 BY [name] Performed at [facility], [address] [place], [place] [address]   Culture, blood (routine x 2)     Status: None (Preliminary result)   Collection Time: 08/10/18 11:37 PM  Result Value Ref Range Status   Specimen Description BLOOD LEFT FOREARM  Final   Special Requests   Final    BOTTLES DRAWN AEROBIC AND ANAEROBIC Blood Culture adequate volume   Culture   Final    NO GROWTH 4 DAYS Performed at [facility], [place]  [address], [place], [place] [address]    Report Status PENDING  Incomplete     Hospital Course: This is a 84 year old who has had significant change in her health over the last several months.  She has failure to thrive she is had GI bleeding she is had multiple falls more confusion and she was brought to the emergency department because of what appeared to be bleeding.  She was not felt to be a candidate for any sort of aggressive care and was being set up for residential hospice care but a bed was not available so she was admitted to general inpatient hospice.  She is poorly responsive but looks comfortable.  Discharge Exam: There were no vitals taken for this visit. Poorly responsive, thin, multiple skin lesions  Disposition: To hospice inpatient facility.  Treatment of her pain anxiety etc. per hospice protocol      Signed: Ashton Belote L   08/14/2018, 10:58 AM

## 2018-08-15 LAB — CULTURE, BLOOD (ROUTINE X 2)
CULTURE: NO GROWTH
CULTURE: NO GROWTH
Special Requests: ADEQUATE
Special Requests: ADEQUATE

## 2018-08-17 ENCOUNTER — Telehealth: Payer: Self-pay | Admitting: Orthopaedic Surgery

## 2018-08-17 NOTE — Telephone Encounter (Signed)
[name] from [facility] called to cancel the appointment for 08/26/18. She states Ms Winslett has been sent to Hospice facility

## 2018-08-26 ENCOUNTER — Ambulatory Visit: Payer: Medicare Other | Admitting: Orthopaedic Surgery

## 2018-09-05 DEATH — deceased

## 2018-09-14 ENCOUNTER — Ambulatory Visit (HOSPITAL_COMMUNITY): Payer: PRIVATE HEALTH INSURANCE | Admitting: Hematology

## 2018-09-14 ENCOUNTER — Other Ambulatory Visit (HOSPITAL_COMMUNITY): Payer: Medicare Other

## 2018-09-14 ENCOUNTER — Ambulatory Visit (HOSPITAL_COMMUNITY): Admitting: Internal Medicine

## 2018-09-14 ENCOUNTER — Other Ambulatory Visit (HOSPITAL_COMMUNITY): Payer: PRIVATE HEALTH INSURANCE

## 2018-09-14 ENCOUNTER — Ambulatory Visit (HOSPITAL_COMMUNITY): Payer: Medicare Other

## 2018-09-14 ENCOUNTER — Ambulatory Visit (HOSPITAL_COMMUNITY): Payer: PRIVATE HEALTH INSURANCE

## 2018-09-29 IMAGING — DX DG SHOULDER 2+V*L*
2 series · 2 of 2 positions shown · non-contrast
Comparison: Chest x-ray 08/07/2015

CLINICAL DATA: Fall with pain to the left shoulder

EXAM:
LEFT SHOULDER - 2+ VIEW

[shoulder grashey]
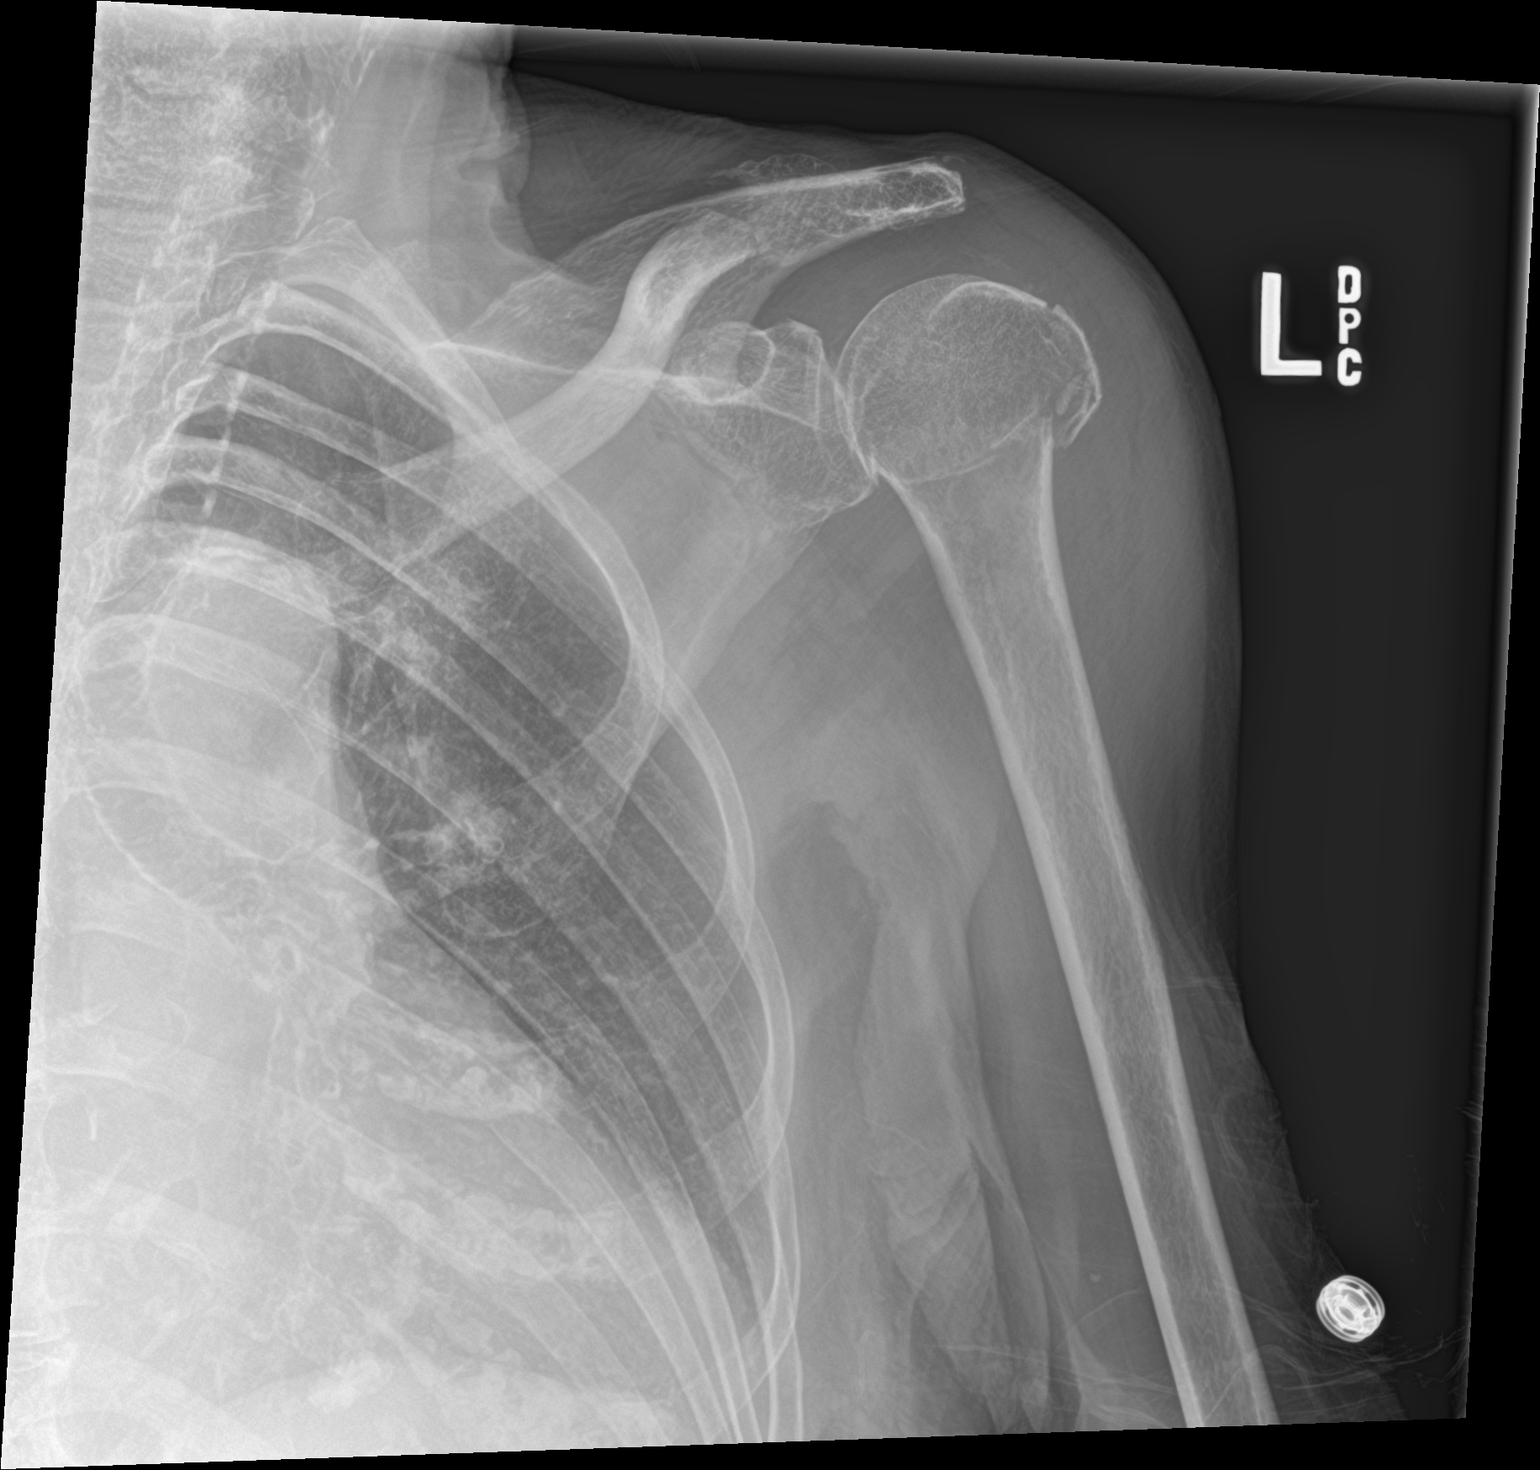

[shoulder y view]
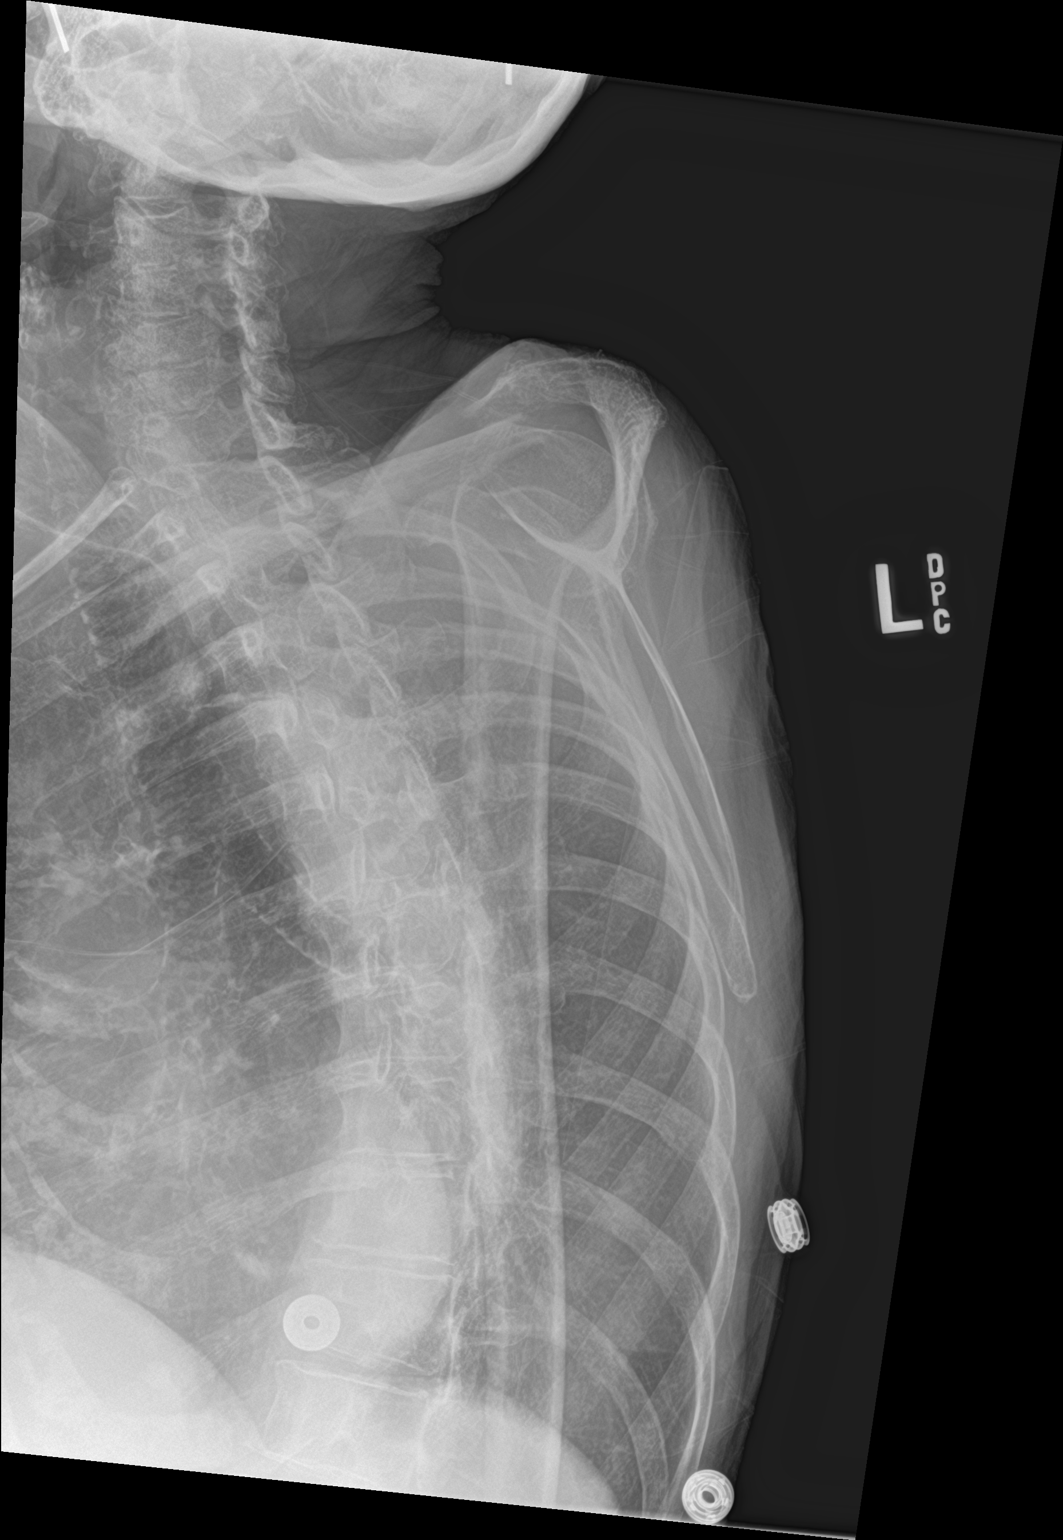

[2 of 2 positions shown; findings below may reference images not displayed]

FINDINGS: Widened appearance of left AC joint could be secondary to prior
distal clavicle resection. No humeral head dislocation. Acute left
humeral neck fracture and fracture of the greater tuberosity of the
humerus.
IMPRESSION: Acute minimally displaced left humeral neck fracture and fracture of
the greater tuberosity of the proximal humerus.

## 2018-10-21 IMAGING — DX DG PELVIS 1-2V
1 series · 1 of 1 positions shown · non-contrast
Comparison: Abdomen and pelvis CT dated 08/07/2015.

CLINICAL DATA: Fell today. Dementia. Unable to give history.

EXAM:
PELVIS - 1-2 VIEW

[pelvis ap]
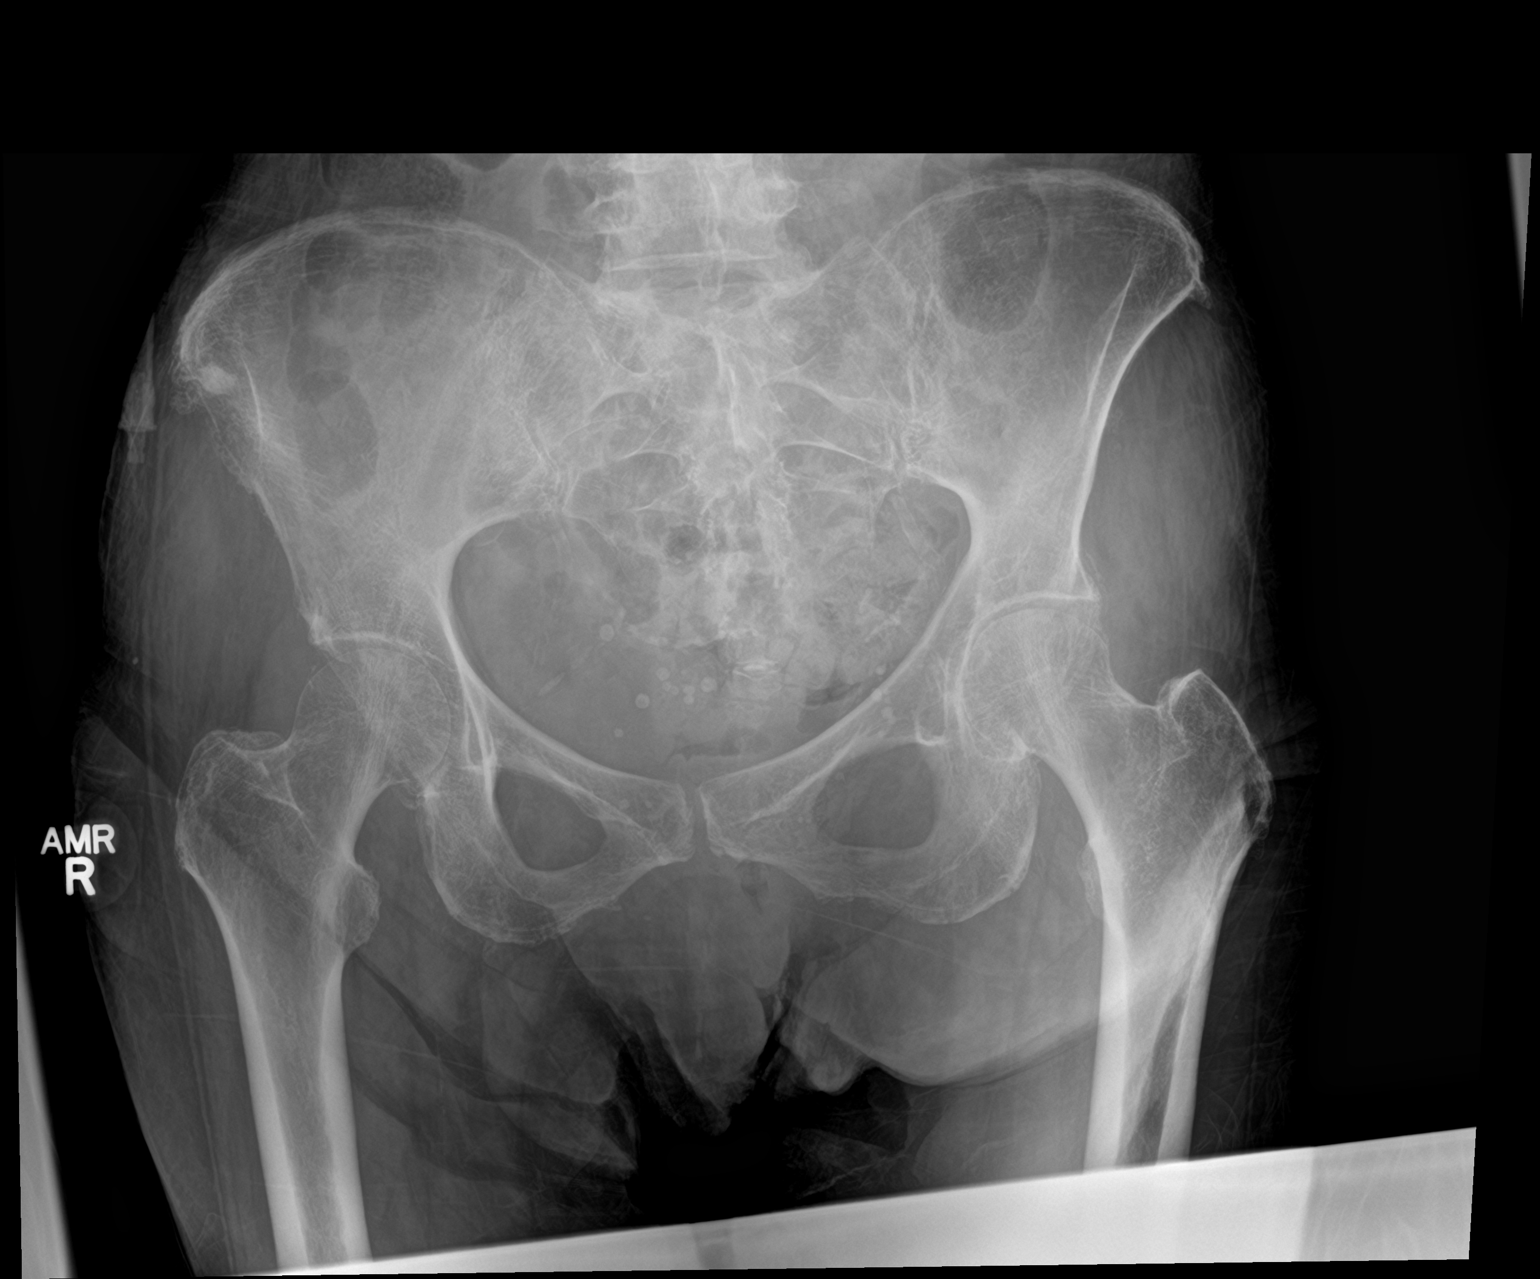

[1 of 1 positions shown; findings below may reference images not displayed]

FINDINGS: Diffuse osteopenia. No fracture or dislocation seen.
IMPRESSION: No fracture or dislocation.

## 2024-05-19 ENCOUNTER — Other Ambulatory Visit: Payer: Self-pay
